# Patient Record
Sex: Female | Born: 1960 | Race: Black or African American | Hispanic: No | Marital: Single | Smoking: Never smoker
Health system: Southern US, Community
[De-identification: ages and names within clinical notes are randomized; demographics above are authoritative.]

## PROBLEM LIST (undated history)

## (undated) DIAGNOSIS — L509 Urticaria, unspecified: Secondary | ICD-10-CM

## (undated) DIAGNOSIS — G43909 Migraine, unspecified, not intractable, without status migrainosus: Secondary | ICD-10-CM

## (undated) DIAGNOSIS — Z923 Personal history of irradiation: Secondary | ICD-10-CM

## (undated) DIAGNOSIS — D126 Benign neoplasm of colon, unspecified: Secondary | ICD-10-CM

## (undated) DIAGNOSIS — Z9221 Personal history of antineoplastic chemotherapy: Secondary | ICD-10-CM

## (undated) DIAGNOSIS — D649 Anemia, unspecified: Secondary | ICD-10-CM

## (undated) DIAGNOSIS — C50919 Malignant neoplasm of unspecified site of unspecified female breast: Secondary | ICD-10-CM

## (undated) DIAGNOSIS — M75120 Complete rotator cuff tear or rupture of unspecified shoulder, not specified as traumatic: Secondary | ICD-10-CM

## (undated) DIAGNOSIS — M75102 Unspecified rotator cuff tear or rupture of left shoulder, not specified as traumatic: Secondary | ICD-10-CM

## (undated) DIAGNOSIS — M7661 Achilles tendinitis, right leg: Secondary | ICD-10-CM

## (undated) DIAGNOSIS — E119 Type 2 diabetes mellitus without complications: Secondary | ICD-10-CM

## (undated) DIAGNOSIS — M1990 Unspecified osteoarthritis, unspecified site: Secondary | ICD-10-CM

## (undated) DIAGNOSIS — N63 Unspecified lump in unspecified breast: Secondary | ICD-10-CM

## (undated) DIAGNOSIS — E785 Hyperlipidemia, unspecified: Secondary | ICD-10-CM

## (undated) DIAGNOSIS — R7303 Prediabetes: Secondary | ICD-10-CM

## (undated) HISTORY — DX: Urticaria, unspecified: L50.9

## (undated) HISTORY — PX: COLONOSCOPY: SHX174

## (undated) HISTORY — DX: Complete rotator cuff tear or rupture of unspecified shoulder, not specified as traumatic: M75.120

## (undated) HISTORY — DX: Malignant neoplasm of unspecified site of unspecified female breast: C50.919

## (undated) HISTORY — DX: Hyperlipidemia, unspecified: E78.5

## (undated) HISTORY — DX: Migraine, unspecified, not intractable, without status migrainosus: G43.909

## (undated) HISTORY — DX: Anemia, unspecified: D64.9

## (undated) HISTORY — DX: Unspecified lump in unspecified breast: N63.0

## (undated) HISTORY — DX: Benign neoplasm of colon, unspecified: D12.6

## (undated) HISTORY — PX: DILATION AND CURETTAGE OF UTERUS: SHX78

## (undated) HISTORY — DX: Unspecified osteoarthritis, unspecified site: M19.90

---

## 1997-02-02 HISTORY — PX: MASTECTOMY: SHX3

## 1997-11-02 HISTORY — PX: BREAST SURGERY: SHX581

## 2000-05-17 ENCOUNTER — Encounter (HOSPITAL_COMMUNITY): Admission: RE | Admit: 2000-05-17 | Discharge: 2000-06-16 | Payer: Self-pay | Admitting: Oncology

## 2000-05-17 ENCOUNTER — Encounter: Admission: RE | Admit: 2000-05-17 | Discharge: 2000-05-17 | Payer: Self-pay | Admitting: Oncology

## 2000-05-18 ENCOUNTER — Encounter: Payer: Self-pay | Admitting: Family Medicine

## 2000-05-18 ENCOUNTER — Encounter: Admission: RE | Admit: 2000-05-18 | Discharge: 2000-05-18 | Payer: Self-pay | Admitting: Family Medicine

## 2000-07-08 ENCOUNTER — Encounter: Payer: Self-pay | Admitting: Neurosurgery

## 2000-07-08 ENCOUNTER — Encounter: Admission: RE | Admit: 2000-07-08 | Discharge: 2000-07-08 | Payer: Self-pay | Admitting: Neurosurgery

## 2000-10-25 ENCOUNTER — Encounter: Admission: RE | Admit: 2000-10-25 | Discharge: 2000-10-25 | Payer: Self-pay | Admitting: Oncology

## 2000-10-25 ENCOUNTER — Encounter (HOSPITAL_COMMUNITY): Admission: RE | Admit: 2000-10-25 | Discharge: 2000-11-24 | Payer: Self-pay | Admitting: Oncology

## 2000-11-22 ENCOUNTER — Encounter (HOSPITAL_COMMUNITY): Payer: Self-pay | Admitting: Oncology

## 2000-11-25 ENCOUNTER — Ambulatory Visit (HOSPITAL_COMMUNITY): Admission: RE | Admit: 2000-11-25 | Discharge: 2000-11-25 | Payer: Self-pay | Admitting: Pediatrics

## 2000-11-25 ENCOUNTER — Encounter: Payer: Self-pay | Admitting: Family Medicine

## 2000-12-23 ENCOUNTER — Other Ambulatory Visit: Admission: RE | Admit: 2000-12-23 | Discharge: 2000-12-23 | Payer: Self-pay | Admitting: Family Medicine

## 2001-02-25 ENCOUNTER — Ambulatory Visit (HOSPITAL_COMMUNITY): Admission: RE | Admit: 2001-02-25 | Discharge: 2001-02-25 | Payer: Self-pay | Admitting: Family Medicine

## 2001-02-25 ENCOUNTER — Encounter: Payer: Self-pay | Admitting: Family Medicine

## 2001-04-22 ENCOUNTER — Encounter: Admission: RE | Admit: 2001-04-22 | Discharge: 2001-04-22 | Payer: Self-pay | Admitting: Oncology

## 2001-04-22 ENCOUNTER — Encounter (HOSPITAL_COMMUNITY): Admission: RE | Admit: 2001-04-22 | Discharge: 2001-05-22 | Payer: Self-pay | Admitting: Oncology

## 2001-05-18 ENCOUNTER — Ambulatory Visit (HOSPITAL_COMMUNITY): Admission: RE | Admit: 2001-05-18 | Discharge: 2001-05-18 | Payer: Self-pay | Admitting: General Surgery

## 2001-10-24 ENCOUNTER — Encounter: Admission: RE | Admit: 2001-10-24 | Discharge: 2001-10-24 | Payer: Self-pay | Admitting: Oncology

## 2001-10-24 ENCOUNTER — Encounter (HOSPITAL_COMMUNITY): Admission: RE | Admit: 2001-10-24 | Discharge: 2001-11-23 | Payer: Self-pay | Admitting: Oncology

## 2001-11-23 ENCOUNTER — Ambulatory Visit (HOSPITAL_COMMUNITY): Admission: RE | Admit: 2001-11-23 | Discharge: 2001-11-23 | Payer: Self-pay | Admitting: Family Medicine

## 2001-11-23 ENCOUNTER — Encounter: Payer: Self-pay | Admitting: Family Medicine

## 2002-01-19 ENCOUNTER — Ambulatory Visit (HOSPITAL_COMMUNITY): Admission: RE | Admit: 2002-01-19 | Discharge: 2002-01-19 | Payer: Self-pay | Admitting: Family Medicine

## 2002-01-19 ENCOUNTER — Encounter: Payer: Self-pay | Admitting: Family Medicine

## 2002-04-24 ENCOUNTER — Encounter: Admission: RE | Admit: 2002-04-24 | Discharge: 2002-04-24 | Payer: Self-pay | Admitting: Oncology

## 2002-04-24 ENCOUNTER — Encounter (HOSPITAL_COMMUNITY): Admission: RE | Admit: 2002-04-24 | Discharge: 2002-05-24 | Payer: Self-pay | Admitting: Oncology

## 2002-05-04 HISTORY — PX: DILATION AND CURETTAGE OF UTERUS: SHX78

## 2002-07-18 ENCOUNTER — Encounter: Admission: RE | Admit: 2002-07-18 | Discharge: 2002-07-18 | Payer: Self-pay | Admitting: Oncology

## 2002-07-18 ENCOUNTER — Encounter (HOSPITAL_COMMUNITY): Admission: RE | Admit: 2002-07-18 | Discharge: 2002-08-17 | Payer: Self-pay | Admitting: Oncology

## 2002-10-24 ENCOUNTER — Encounter (HOSPITAL_COMMUNITY): Admission: RE | Admit: 2002-10-24 | Discharge: 2002-11-23 | Payer: Self-pay | Admitting: Oncology

## 2002-10-24 ENCOUNTER — Encounter: Admission: RE | Admit: 2002-10-24 | Discharge: 2002-10-24 | Payer: Self-pay | Admitting: Oncology

## 2002-11-29 ENCOUNTER — Encounter: Admission: RE | Admit: 2002-11-29 | Discharge: 2002-11-29 | Payer: Self-pay | Admitting: Oncology

## 2002-11-29 ENCOUNTER — Encounter (HOSPITAL_COMMUNITY): Admission: RE | Admit: 2002-11-29 | Discharge: 2002-12-29 | Payer: Self-pay | Admitting: Oncology

## 2003-01-09 ENCOUNTER — Encounter (INDEPENDENT_AMBULATORY_CARE_PROVIDER_SITE_OTHER): Payer: Self-pay | Admitting: *Deleted

## 2003-01-09 LAB — CONVERTED CEMR LAB

## 2003-03-22 ENCOUNTER — Emergency Department (HOSPITAL_COMMUNITY): Admission: EM | Admit: 2003-03-22 | Discharge: 2003-03-22 | Payer: Self-pay | Admitting: Emergency Medicine

## 2003-04-03 ENCOUNTER — Ambulatory Visit (HOSPITAL_COMMUNITY): Admission: RE | Admit: 2003-04-03 | Discharge: 2003-04-03 | Payer: Self-pay | Admitting: Obstetrics and Gynecology

## 2003-04-10 ENCOUNTER — Encounter (INDEPENDENT_AMBULATORY_CARE_PROVIDER_SITE_OTHER): Payer: Self-pay | Admitting: *Deleted

## 2003-04-10 ENCOUNTER — Ambulatory Visit (HOSPITAL_COMMUNITY): Admission: RE | Admit: 2003-04-10 | Discharge: 2003-04-10 | Payer: Self-pay | Admitting: Obstetrics and Gynecology

## 2003-05-01 ENCOUNTER — Encounter (HOSPITAL_COMMUNITY): Admission: RE | Admit: 2003-05-01 | Discharge: 2003-05-31 | Payer: Self-pay | Admitting: Oncology

## 2003-05-01 ENCOUNTER — Encounter: Admission: RE | Admit: 2003-05-01 | Discharge: 2003-05-01 | Payer: Self-pay | Admitting: Oncology

## 2003-07-10 ENCOUNTER — Emergency Department (HOSPITAL_COMMUNITY): Admission: EM | Admit: 2003-07-10 | Discharge: 2003-07-10 | Payer: Self-pay | Admitting: Emergency Medicine

## 2003-07-25 ENCOUNTER — Encounter: Admission: RE | Admit: 2003-07-25 | Discharge: 2003-07-25 | Payer: Self-pay | Admitting: Oncology

## 2003-07-25 ENCOUNTER — Encounter (HOSPITAL_COMMUNITY): Admission: RE | Admit: 2003-07-25 | Discharge: 2003-08-24 | Payer: Self-pay | Admitting: Oncology

## 2003-11-30 ENCOUNTER — Encounter (HOSPITAL_COMMUNITY): Admission: RE | Admit: 2003-11-30 | Discharge: 2003-12-30 | Payer: Self-pay | Admitting: Oncology

## 2004-03-05 ENCOUNTER — Ambulatory Visit: Payer: Self-pay | Admitting: Family Medicine

## 2004-04-29 ENCOUNTER — Encounter: Admission: RE | Admit: 2004-04-29 | Discharge: 2004-04-29 | Payer: Self-pay | Admitting: Oncology

## 2004-04-29 ENCOUNTER — Ambulatory Visit (HOSPITAL_COMMUNITY): Payer: Self-pay | Admitting: Oncology

## 2004-04-29 ENCOUNTER — Encounter (HOSPITAL_COMMUNITY): Admission: RE | Admit: 2004-04-29 | Discharge: 2004-05-29 | Payer: Self-pay | Admitting: Oncology

## 2004-10-01 ENCOUNTER — Ambulatory Visit: Payer: Self-pay | Admitting: Family Medicine

## 2004-12-18 ENCOUNTER — Ambulatory Visit (HOSPITAL_COMMUNITY): Admission: RE | Admit: 2004-12-18 | Discharge: 2004-12-18 | Payer: Self-pay | Admitting: Family Medicine

## 2005-01-01 ENCOUNTER — Ambulatory Visit: Payer: Self-pay | Admitting: Orthopedic Surgery

## 2005-01-29 ENCOUNTER — Ambulatory Visit: Payer: Self-pay | Admitting: Family Medicine

## 2005-04-27 ENCOUNTER — Ambulatory Visit (HOSPITAL_COMMUNITY): Payer: Self-pay | Admitting: Oncology

## 2005-04-27 ENCOUNTER — Encounter (HOSPITAL_COMMUNITY): Admission: RE | Admit: 2005-04-27 | Discharge: 2005-05-27 | Payer: Self-pay | Admitting: Oncology

## 2005-04-27 ENCOUNTER — Encounter: Admission: RE | Admit: 2005-04-27 | Discharge: 2005-04-27 | Payer: Self-pay | Admitting: Oncology

## 2005-09-09 ENCOUNTER — Ambulatory Visit: Payer: Self-pay | Admitting: Family Medicine

## 2005-11-17 ENCOUNTER — Ambulatory Visit: Payer: Self-pay | Admitting: Family Medicine

## 2005-12-21 ENCOUNTER — Ambulatory Visit (HOSPITAL_COMMUNITY): Admission: RE | Admit: 2005-12-21 | Discharge: 2005-12-21 | Payer: Self-pay | Admitting: Family Medicine

## 2005-12-21 ENCOUNTER — Encounter: Admission: RE | Admit: 2005-12-21 | Discharge: 2005-12-21 | Payer: Self-pay | Admitting: Oncology

## 2005-12-31 ENCOUNTER — Encounter: Admission: RE | Admit: 2005-12-31 | Discharge: 2005-12-31 | Payer: Self-pay | Admitting: Oncology

## 2006-01-07 ENCOUNTER — Ambulatory Visit: Payer: Self-pay | Admitting: Family Medicine

## 2006-04-26 ENCOUNTER — Encounter (HOSPITAL_COMMUNITY): Admission: RE | Admit: 2006-04-26 | Discharge: 2006-05-26 | Payer: Self-pay | Admitting: Oncology

## 2006-04-26 ENCOUNTER — Ambulatory Visit (HOSPITAL_COMMUNITY): Payer: Self-pay | Admitting: Oncology

## 2006-10-27 ENCOUNTER — Ambulatory Visit (HOSPITAL_COMMUNITY): Payer: Self-pay | Admitting: Oncology

## 2006-10-27 ENCOUNTER — Encounter (HOSPITAL_COMMUNITY): Admission: RE | Admit: 2006-10-27 | Discharge: 2006-11-02 | Payer: Self-pay | Admitting: Oncology

## 2006-11-18 ENCOUNTER — Ambulatory Visit: Payer: Self-pay | Admitting: Family Medicine

## 2006-11-23 ENCOUNTER — Ambulatory Visit (HOSPITAL_COMMUNITY): Admission: RE | Admit: 2006-11-23 | Discharge: 2006-11-23 | Payer: Self-pay | Admitting: Family Medicine

## 2006-12-16 ENCOUNTER — Ambulatory Visit (HOSPITAL_COMMUNITY): Admission: RE | Admit: 2006-12-16 | Discharge: 2006-12-16 | Payer: Self-pay | Admitting: Obstetrics and Gynecology

## 2006-12-17 ENCOUNTER — Ambulatory Visit (HOSPITAL_COMMUNITY): Admission: RE | Admit: 2006-12-17 | Discharge: 2006-12-17 | Payer: Self-pay | Admitting: Obstetrics and Gynecology

## 2006-12-17 ENCOUNTER — Encounter (INDEPENDENT_AMBULATORY_CARE_PROVIDER_SITE_OTHER): Payer: Self-pay | Admitting: Obstetrics and Gynecology

## 2007-01-03 ENCOUNTER — Encounter (HOSPITAL_COMMUNITY): Admission: RE | Admit: 2007-01-03 | Discharge: 2007-02-02 | Payer: Self-pay | Admitting: Oncology

## 2007-02-03 HISTORY — PX: OTHER SURGICAL HISTORY: SHX169

## 2007-02-22 ENCOUNTER — Encounter: Payer: Self-pay | Admitting: *Deleted

## 2007-02-22 DIAGNOSIS — M779 Enthesopathy, unspecified: Secondary | ICD-10-CM | POA: Insufficient documentation

## 2007-02-22 DIAGNOSIS — E785 Hyperlipidemia, unspecified: Secondary | ICD-10-CM | POA: Insufficient documentation

## 2007-02-22 DIAGNOSIS — C50919 Malignant neoplasm of unspecified site of unspecified female breast: Secondary | ICD-10-CM | POA: Insufficient documentation

## 2007-02-22 DIAGNOSIS — E669 Obesity, unspecified: Secondary | ICD-10-CM | POA: Insufficient documentation

## 2007-03-16 ENCOUNTER — Encounter: Payer: Self-pay | Admitting: Family Medicine

## 2007-03-16 LAB — CONVERTED CEMR LAB
BUN: 14 mg/dL (ref 6–23)
CO2: 19 meq/L (ref 19–32)
Calcium: 9.3 mg/dL (ref 8.4–10.5)
Chloride: 104 meq/L (ref 96–112)
Creatinine, Ser: 0.7 mg/dL (ref 0.40–1.20)
Eosinophils Relative: 2 % (ref 0–5)
Glucose, Bld: 82 mg/dL (ref 70–99)
HCT: 39.7 % (ref 36.0–46.0)
Hemoglobin: 13.1 g/dL (ref 12.0–15.0)
LDL Cholesterol: 142 mg/dL — ABNORMAL HIGH (ref 0–99)
Lymphocytes Relative: 44 % (ref 12–46)
Monocytes Absolute: 0.2 10*3/uL (ref 0.1–1.0)
Monocytes Relative: 5 % (ref 3–12)
Neutro Abs: 2 10*3/uL (ref 1.7–7.7)
RBC: 4.26 M/uL (ref 3.87–5.11)
RDW: 15.7 % — ABNORMAL HIGH (ref 11.5–15.5)
TSH: 1.669 microintl units/mL (ref 0.350–5.50)

## 2007-03-23 ENCOUNTER — Ambulatory Visit: Payer: Self-pay | Admitting: Family Medicine

## 2007-04-25 ENCOUNTER — Encounter (HOSPITAL_COMMUNITY): Admission: RE | Admit: 2007-04-25 | Discharge: 2007-05-25 | Payer: Self-pay | Admitting: Oncology

## 2007-04-25 ENCOUNTER — Ambulatory Visit (HOSPITAL_COMMUNITY): Payer: Self-pay | Admitting: Oncology

## 2007-05-25 ENCOUNTER — Encounter: Payer: Self-pay | Admitting: Orthopedic Surgery

## 2007-05-25 ENCOUNTER — Emergency Department (HOSPITAL_COMMUNITY): Admission: EM | Admit: 2007-05-25 | Discharge: 2007-05-26 | Payer: Self-pay | Admitting: Emergency Medicine

## 2007-05-31 ENCOUNTER — Ambulatory Visit: Payer: Self-pay | Admitting: Family Medicine

## 2007-06-08 ENCOUNTER — Ambulatory Visit: Payer: Self-pay | Admitting: Orthopedic Surgery

## 2007-06-08 DIAGNOSIS — M75120 Complete rotator cuff tear or rupture of unspecified shoulder, not specified as traumatic: Secondary | ICD-10-CM | POA: Insufficient documentation

## 2007-06-08 HISTORY — DX: Complete rotator cuff tear or rupture of unspecified shoulder, not specified as traumatic: M75.120

## 2007-06-16 ENCOUNTER — Telehealth: Payer: Self-pay | Admitting: Orthopedic Surgery

## 2007-06-20 ENCOUNTER — Encounter: Payer: Self-pay | Admitting: Orthopedic Surgery

## 2007-06-22 ENCOUNTER — Telehealth: Payer: Self-pay | Admitting: Orthopedic Surgery

## 2007-06-30 ENCOUNTER — Encounter: Payer: Self-pay | Admitting: Orthopedic Surgery

## 2007-07-05 ENCOUNTER — Telehealth: Payer: Self-pay | Admitting: Orthopedic Surgery

## 2007-07-11 ENCOUNTER — Ambulatory Visit: Payer: Self-pay | Admitting: Orthopedic Surgery

## 2007-07-20 ENCOUNTER — Encounter (HOSPITAL_COMMUNITY): Admission: RE | Admit: 2007-07-20 | Discharge: 2007-08-19 | Payer: Self-pay | Admitting: Orthopedic Surgery

## 2007-07-20 ENCOUNTER — Encounter: Payer: Self-pay | Admitting: Orthopedic Surgery

## 2007-08-17 ENCOUNTER — Encounter: Payer: Self-pay | Admitting: Orthopedic Surgery

## 2007-08-22 ENCOUNTER — Encounter (HOSPITAL_COMMUNITY): Admission: RE | Admit: 2007-08-22 | Discharge: 2007-09-21 | Payer: Self-pay | Admitting: Orthopedic Surgery

## 2007-09-15 ENCOUNTER — Encounter: Payer: Self-pay | Admitting: Orthopedic Surgery

## 2007-09-19 ENCOUNTER — Ambulatory Visit: Payer: Self-pay | Admitting: Orthopedic Surgery

## 2007-09-19 DIAGNOSIS — M25519 Pain in unspecified shoulder: Secondary | ICD-10-CM | POA: Insufficient documentation

## 2007-10-24 ENCOUNTER — Ambulatory Visit (HOSPITAL_COMMUNITY): Payer: Self-pay | Admitting: Oncology

## 2007-10-24 ENCOUNTER — Encounter (HOSPITAL_COMMUNITY): Admission: RE | Admit: 2007-10-24 | Discharge: 2007-10-31 | Payer: Self-pay | Admitting: Oncology

## 2007-12-02 ENCOUNTER — Encounter: Admission: RE | Admit: 2007-12-02 | Discharge: 2007-12-02 | Payer: Self-pay | Admitting: Obstetrics and Gynecology

## 2008-01-11 ENCOUNTER — Ambulatory Visit: Payer: Self-pay | Admitting: Family Medicine

## 2008-01-11 DIAGNOSIS — M25559 Pain in unspecified hip: Secondary | ICD-10-CM | POA: Insufficient documentation

## 2008-01-11 DIAGNOSIS — R109 Unspecified abdominal pain: Secondary | ICD-10-CM | POA: Insufficient documentation

## 2008-01-11 DIAGNOSIS — R5383 Other fatigue: Secondary | ICD-10-CM

## 2008-01-11 DIAGNOSIS — R5381 Other malaise: Secondary | ICD-10-CM | POA: Insufficient documentation

## 2008-02-02 ENCOUNTER — Telehealth: Payer: Self-pay | Admitting: Family Medicine

## 2008-04-23 ENCOUNTER — Ambulatory Visit (HOSPITAL_COMMUNITY): Payer: Self-pay | Admitting: Oncology

## 2008-04-23 ENCOUNTER — Encounter (HOSPITAL_COMMUNITY): Admission: RE | Admit: 2008-04-23 | Discharge: 2008-05-23 | Payer: Self-pay | Admitting: Oncology

## 2008-09-03 ENCOUNTER — Ambulatory Visit: Payer: Self-pay | Admitting: Family Medicine

## 2008-09-03 DIAGNOSIS — R42 Dizziness and giddiness: Secondary | ICD-10-CM | POA: Insufficient documentation

## 2008-09-03 DIAGNOSIS — S139XXA Sprain of joints and ligaments of unspecified parts of neck, initial encounter: Secondary | ICD-10-CM | POA: Insufficient documentation

## 2008-10-23 ENCOUNTER — Encounter: Payer: Self-pay | Admitting: Family Medicine

## 2008-10-24 LAB — CONVERTED CEMR LAB
Basophils Absolute: 0 10*3/uL (ref 0.0–0.1)
Chloride: 106 meq/L (ref 96–112)
Cholesterol: 182 mg/dL (ref 0–200)
Glucose, Bld: 82 mg/dL (ref 70–99)
HCT: 38.3 % (ref 36.0–46.0)
Hemoglobin: 12.8 g/dL (ref 12.0–15.0)
LDL Cholesterol: 107 mg/dL — ABNORMAL HIGH (ref 0–99)
Lymphocytes Relative: 32 % (ref 12–46)
Lymphs Abs: 1.9 10*3/uL (ref 0.7–4.0)
Monocytes Absolute: 0.4 10*3/uL (ref 0.1–1.0)
Neutro Abs: 3.5 10*3/uL (ref 1.7–7.7)
Potassium: 4 meq/L (ref 3.5–5.3)
RBC: 4.21 M/uL (ref 3.87–5.11)
RDW: 15.3 % (ref 11.5–15.5)
Sodium: 140 meq/L (ref 135–145)
Total CHOL/HDL Ratio: 4
Triglycerides: 147 mg/dL (ref <150)
VLDL: 29 mg/dL (ref 0–40)
WBC: 5.9 10*3/uL (ref 4.0–10.5)

## 2008-12-04 ENCOUNTER — Ambulatory Visit (HOSPITAL_COMMUNITY): Admission: RE | Admit: 2008-12-04 | Discharge: 2008-12-04 | Payer: Self-pay | Admitting: Family Medicine

## 2009-01-10 ENCOUNTER — Ambulatory Visit: Payer: Self-pay | Admitting: Family Medicine

## 2009-01-10 DIAGNOSIS — L049 Acute lymphadenitis, unspecified: Secondary | ICD-10-CM | POA: Insufficient documentation

## 2009-02-09 ENCOUNTER — Emergency Department (HOSPITAL_COMMUNITY): Admission: EM | Admit: 2009-02-09 | Discharge: 2009-02-09 | Payer: Self-pay | Admitting: Emergency Medicine

## 2009-04-19 ENCOUNTER — Encounter (HOSPITAL_COMMUNITY): Admission: RE | Admit: 2009-04-19 | Discharge: 2009-05-19 | Payer: Self-pay | Admitting: Oncology

## 2009-04-19 ENCOUNTER — Ambulatory Visit (HOSPITAL_COMMUNITY): Payer: Self-pay | Admitting: Oncology

## 2009-04-23 ENCOUNTER — Encounter: Payer: Self-pay | Admitting: Family Medicine

## 2009-05-17 ENCOUNTER — Ambulatory Visit: Payer: Self-pay | Admitting: Family Medicine

## 2009-05-17 DIAGNOSIS — E041 Nontoxic single thyroid nodule: Secondary | ICD-10-CM | POA: Insufficient documentation

## 2009-05-20 LAB — CONVERTED CEMR LAB
Basophils Relative: 0 % (ref 0–1)
Calcium: 9.5 mg/dL (ref 8.4–10.5)
Cholesterol: 205 mg/dL — ABNORMAL HIGH (ref 0–200)
Creatinine, Ser: 0.73 mg/dL (ref 0.40–1.20)
Eosinophils Absolute: 0.1 10*3/uL (ref 0.0–0.7)
HDL: 49 mg/dL (ref >39)
MCHC: 32.5 g/dL (ref 30.0–36.0)
MCV: 90.8 fL (ref 78.0–100.0)
Neutrophils Relative %: 49 % (ref 43–77)
Platelets: 234 10*3/uL (ref 150–400)
Triglycerides: 108 mg/dL (ref <150)
WBC: 4.1 10*3/uL (ref 4.0–10.5)

## 2009-12-05 ENCOUNTER — Ambulatory Visit (HOSPITAL_COMMUNITY): Admission: RE | Admit: 2009-12-05 | Discharge: 2009-12-05 | Payer: Self-pay | Admitting: Oncology

## 2009-12-30 ENCOUNTER — Ambulatory Visit: Payer: Self-pay | Admitting: Family Medicine

## 2009-12-31 ENCOUNTER — Ambulatory Visit (HOSPITAL_COMMUNITY): Admission: RE | Admit: 2009-12-31 | Discharge: 2009-12-31 | Payer: Self-pay | Admitting: Family Medicine

## 2010-01-01 ENCOUNTER — Encounter: Payer: Self-pay | Admitting: Family Medicine

## 2010-01-01 DIAGNOSIS — R51 Headache: Secondary | ICD-10-CM

## 2010-01-01 DIAGNOSIS — R519 Headache, unspecified: Secondary | ICD-10-CM | POA: Insufficient documentation

## 2010-01-03 LAB — CONVERTED CEMR LAB
HDL: 52 mg/dL (ref >39)
Hgb A1c MFr Bld: 6.1 % — ABNORMAL HIGH (ref <5.7)
LDL Cholesterol: 132 mg/dL — ABNORMAL HIGH (ref 0–99)
Total CHOL/HDL Ratio: 3.9

## 2010-01-15 ENCOUNTER — Telehealth: Payer: Self-pay | Admitting: Family Medicine

## 2010-01-16 ENCOUNTER — Ambulatory Visit: Payer: Self-pay | Admitting: Otolaryngology

## 2010-01-16 ENCOUNTER — Encounter: Payer: Self-pay | Admitting: Family Medicine

## 2010-02-22 ENCOUNTER — Encounter: Payer: Self-pay | Admitting: Family Medicine

## 2010-02-23 ENCOUNTER — Encounter: Payer: Self-pay | Admitting: Orthopedic Surgery

## 2010-02-23 ENCOUNTER — Encounter: Payer: Self-pay | Admitting: Family Medicine

## 2010-02-23 ENCOUNTER — Encounter (HOSPITAL_COMMUNITY): Payer: Self-pay | Admitting: Oncology

## 2010-03-04 NOTE — Letter (Signed)
Summary: Letter  Letter   Imported By: Lind Guest 01/02/2010 08:53:21  _____________________________________________________________________  External Attachment:    Type:   Image     Comment:   External Document

## 2010-03-04 NOTE — Assessment & Plan Note (Signed)
Summary: back   Vital Signs:  Patient profile:   49 year old female Menstrual status:  irregular Height:      64 inches Weight:      284.25 pounds BMI:     48.97 O2 Sat:      96 % on Room air Pulse rate:   84 / minute Pulse rhythm:   regular Resp:     16 per minute BP sitting:   110 / 70  (left arm)  Vitals Entered By: Adella Hare LPN (December 30, 2009 8:37 AM)  Nutrition Counseling: Patient's BMI is greater than 25 and therefore counseled on weight management options.  O2 Flow:  Room air CC: follow-up visit Is Patient Diabetic? No Pain Assessment Patient in pain? no      Comments did not bring meds to ov   CC:  follow-up visit.  History of Present Illness: 2 weeks ago pt upper back pain andspasm after making a bed, she saw gynae the next day who advised it was likely  a pullled muscle, nothing was done symptoms have resolved, but wants muscle relaxant for as needed use. Reports  that she has otherwise been doing well. Denies recent fever or chills. Denies sinus pressure, nasal congestion , ear pain or sore throat. Denies chest congestion, or cough productive of sputum. Denies chest pain, palpitations, PND, orthopnea or leg swelling. Denies abdominal pain, nausea, vomitting, diarrhea or constipation. Denies change in bowel movements or bloody stool. Denies dysuria , frequency, incontinence or hesitancy. . Denies  vertigo, or seizures.she does have intermittent headaches Denies depression, anxiety or insomnia. Denies  rash, lesions, or itch. ms Norman has been walking less consitenetly than in the past , and she continues to stuggle with weight gain. states she recently had a pelvic exam but no rectalwas done, requesting same.      Allergies (verified): No Known Drug Allergies  Review of Systems      See HPI General:  Complains of fatigue. Eyes:  Denies blurring, discharge, eye pain, and red eye. MS:  Complains of low back pain, mid back pain, and  stiffness; hip pain and intermittent back pain , and numbness and tingling in hands x 1 year, no testing or specific mx desired at this time. Neuro:  Complains of headaches and tingling; c/o tingling and numbness in the hands  for approx 1 yr, not waking her up and no weakness in the hands. Endo:  Denies cold intolerance, excessive hunger, excessive thirst, and excessive urination. Heme:  Denies abnormal bruising and bleeding. Allergy:  Denies hives or rash and itching eyes.  Physical Exam  General:  Well-developed,obeseiin no acute distress; alert,appropriate and cooperative throughout examination HEENT: No facial asymmetry, goiter EOMI, No sinus tenderness, TM's Clear, oropharynx  pink and moist.   Chest: Clear to auscultation bilaterally.  CVS: S1, S2, No murmurs, No S3.   Abd: Soft, Nontender.obese, no organomegaly or masses palpated. Rectal; guaivc negative stool MS: decreased ROM spine,adequate in hips, shoulders and knees. Thenar wasting of both hands, left greater than right , and positive tinnel's Ext: No edema.   CNS: CN 2-12 intact, power tone and sensation normal throughout.   Skin: Intact, no visible lesions or rashes.  Psych: Good eye contact, normal affect.  Memory intact, not anxious or depressed appearing.    Impression & Recommendations:  Problem # 1:  GOITER (ICD-240.9) Assessment Comment Only  Orders: Radiology Referral (Radiology) ENT Referral (ENT)  Problem # 2:  OBESITY, UNSPECIFIED (ICD-278.00) Assessment: Deteriorated  Ht: 64 (12/30/2009)   Wt: 284.25 (12/30/2009)   BMI: 48.97 (12/30/2009) therapeutic lifestyle change discussed and encouraged  Problem # 3:  DYSLIPIDEMIA (ICD-272.4) Assessment: Comment Only  Orders: T-Lipid Profile ([address])    HDL:49 (05/17/2009), 46 (10/23/2008)  LDL:134 (05/17/2009), 107 (10/23/2008)  Chol:205 (05/17/2009), 182 (10/23/2008)  Trig:108 (05/17/2009), 147 (10/23/2008) Low fat dietdiscussed and  encouraged  Problem # 4:  HIP PAIN, RIGHT (ICD-719.45) Assessment: Unchanged  The following medications were removed from the medication list:    Ibuprofen 800 Mg Tabs (Ibuprofen) .Marland Kitchen... Take 1 tablet by mouth three times a day as needed    Fioricet 50-325-40 Mg Tabs (Butalbital-apap-caffeine) .Marland Kitchen... 1-2 tabs qid as needed Her updated medication list for this problem includes:    Adult Aspirin Low Strength 81 Mg Tbdp (Aspirin) .Marland Kitchen... Take one tablet by mouth once aday    Cyclobenzaprine Hcl 10 Mg Tabs (Cyclobenzaprine hcl) .Marland Kitchen... Take 1 tablet by mouth two times a day as needed for spasm    Fioricet 50-325-40 Mg Tabs (Butalbital-apap-caffeine) ..... One  tablets three times daily as needed fo severe  headache as needed    Ibuprofen 800 Mg Tabs (Ibuprofen) .Marland Kitchen... Take 1 tablet by mouth three times a day as needed for pain  Problem # 5:  HEADACHE (ICD-784.0) Assessment: Unchanged  The following medications were removed from the medication list:    Ibuprofen 800 Mg Tabs (Ibuprofen) .Marland Kitchen... Take 1 tablet by mouth three times a day as needed    Fioricet 50-325-40 Mg Tabs (Butalbital-apap-caffeine) .Marland Kitchen... 1-2 tabs qid as needed Her updated medication list for this problem includes:    Adult Aspirin Low Strength 81 Mg Tbdp (Aspirin) .Marland Kitchen... Take one tablet by mouth once aday    Fioricet 50-325-40 Mg Tabs (Butalbital-apap-caffeine) ..... One  tablets three times daily as needed fo severe  headache as needed    Ibuprofen 800 Mg Tabs (Ibuprofen) .Marland Kitchen... Take 1 tablet by mouth three times a day as needed for pain  Complete Medication List: 1)  Adult Aspirin Low Strength 81 Mg Tbdp (Aspirin) .... Take one tablet by mouth once aday 2)  Multivitamin  3)  Slow Fe 160 (50 Fe) Mg Cr-tabs (Ferrous sulfate dried) .... Take 1 tablet by mouth once a day 4)  Restoril 15 Mg Caps (Temazepam) .... Take 1 capsule by mouth at bedtime as needed 5)  Cyclobenzaprine Hcl 10 Mg Tabs (Cyclobenzaprine hcl) .... Take 1 tablet by  mouth two times a day as needed for spasm 6)  Meclizine Hcl 25 Mg Chew (Meclizine hcl) .... Take 1 tablet by mouth three times a day as needed for dizziness 7)  Fioricet 50-325-40 Mg Tabs (Butalbital-apap-caffeine) .... One  tablets three times daily as needed fo severe  headache as needed 8)  Ibuprofen 800 Mg Tabs (Ibuprofen) .... Take 1 tablet by mouth three times a day as needed for pain  Other Orders: T- Hemoglobin A1C ([address]11-91478) Hemoccult Guaiac-1 spec.(in office) ([address]562)  Patient Instructions: 1)  Follow up appointment in 4months 2)  It is important that you exercise regularly at least 30 minutes 5 times a week. If you develop chest pain, have severe difficulty breathing, or feel very tired , stop exercising immediately and seek medical attention. 3)  You need to lose weight. Consider a lower calorie diet and regular exercise.  4)  Lipid Panel prior to visit, ICD-9: 5)  HbgA1C prior to visit, ICD-9: Prescriptions: IBUPROFEN 800 MG TABS (IBUPROFEN) Take 1 tablet by mouth three times a day as needed  for pain  #42 x 1   Entered and Authorized by:   [name] MD   Signed by:   [name] MD on 12/30/2009   Method used:   Electronically to        [facility]  [address] (retail)       [address]       [place], [place]  [address]       Ph: [phone]       Fax: [phone]   RxID:   [id number] FIORICET 50-325-40 MG TABS [facility]) one  tablets three times daily as needed fo severe  headache as needed  #30 x 2   Entered and Authorized by:   [name] MD   Signed by:   [name] MD on 12/30/2009   Method used:   Electronically to        [facility]  [address] (retail)       [address]       [place], [place]  [address]       Ph: [phone]       Fax: [phone]   RxID:   [id number] MECLIZINE HCL 25 MG CHEW (MECLIZINE HCL) Take 1 tablet by mouth three times a day as needed for dizziness   #30 x 1   Entered and Authorized by:   [name] MD   Signed by:   [name] MD on 12/30/2009   Method used:   Electronically to        [facility]  [address] (retail)       [address]       [place], [place]  [address]       Ph: [phone]       Fax: [phone]   RxID:   [id number] CYCLOBENZAPRINE HCL 10 MG TABS (CYCLOBENZAPRINE HCL) Take 1 tablet by mouth two times a day as needed for spasm  #60 x 0   Entered and Authorized by:   [name] MD   Signed by:   [name] MD on 12/30/2009   Method used:   Electronically to        [facility]  [address] (retail)       [address]       [place], [place]  [address]       Ph: [phone]       Fax: [phone]   RxID:   [id number]    Orders Added: 1)  Est. Patient Level IV [[address]] 2)  T-Lipid Profile [[address]61-[address]30] 3)  T- Hemoglobin A1C [83036-23375] 4)  Hemoccult Guaiac-1 spec.(in office) [82270] 5)  Radiology Referral [Radiology] 6)  ENT Referral [ENT]    Laboratory Results  Date/Time Received: December 30, 2009 9:14 AM  Date/Time Reported: December 30, 2009 9:14 AM   Stool - Occult Blood Hemmoccult #1: negative Date: 12/30/2009 Comments: 5030 5/14 50590 1L 03/12 Adella Hare LPN  December 30, 2009 9:15 AM

## 2010-03-04 NOTE — Progress Notes (Signed)
Summary: [facility]  [facility]   Imported By: Lind Guest 05/02/2009 11:20:15  _____________________________________________________________________  External Attachment:    Type:   Image     Comment:   External Document

## 2010-03-04 NOTE — Assessment & Plan Note (Signed)
Summary: F UP   Vital Signs:  Patient profile:   50 year old female Menstrual status:  irregular Height:      64 inches Weight:      275.25 pounds BMI:     47.42 O2 Sat:      94 % Pulse rate:   83 / minute Pulse rhythm:   regular Resp:     16 per minute BP sitting:   118 / 80  (left arm) Cuff size:   large  Vitals Entered By: Everitt Amber LPN (May 17, 2009 8:00 AM)  Nutrition Counseling: Patient's BMI is greater than 25 and therefore counseled on weight management options. CC: Follow up chronic problems     Menstrual Status irregular Last PAP Result Done   CC:  Follow up chronic problems.  History of Present Illness: Reports  that she has been doing well. Denies recent fever or chills. Denies sinus pressure, nasal congestion , ear pain or sore throat. Denies chest congestion, or cough productive of sputum. Denies chest pain, palpitations, PND, orthopnea or leg swelling. Denies abdominal pain, nausea, vomitting, diarrhea or constipation. Denies change in bowel movements or bloody stool. Denies dysuria , frequency, incontinence or hesitancy. Denies  joint pain, swelling, or reduced mobility. Denies headaches, vertigo, seizures. Denies depression, anxiety or insomnia. Denies  rash, lesions, or itch She walks at least 5 days per wee for 1 hour each time, she has modified her diet somewhat and is disappointed about her weight loss     Current Medications (verified): 1)  Adult Aspirin Low Strength 81 Mg  Tbdp (Aspirin) .... Take One Tablet By Mouth Once Aday 2)  Multivitamin 3)  Slow Fe 160 (50 Fe) Mg Cr-Tabs (Ferrous Sulfate Dried) .... Take 1 Tablet By Mouth Once A Day 4)  Ibuprofen 800 Mg Tabs (Ibuprofen) .... Take 1 Tablet By Mouth Three Times A Day As Needed 5)  Fioricet 50-325-40 Mg Tabs (Butalbital-Apap-Caffeine) .Marland Kitchen.. 1-2 Tabs Qid As Needed 6)  Meclizine Hcl 25 Mg Tabs (Meclizine Hcl) .... Take 1 Tablet By Mouth Three Times A Day As Needed  Allergies  (verified): No Known Drug Allergies  Review of Systems      See HPI General:  Complains of fatigue, sleep disorder, sweats, and weakness; no energy with night sweats and fatigue. Eyes:  Denies blurring, discharge, eye pain, and red eye. GU:  Complains of abnormal vaginal bleeding; pt rep[ts she bled internally into her womb for Dec to Jan, saw gynae then who did sonogram saw clots, did d/c since then nothing and now hot flashes. MS:  Denies joint pain and stiffness; shoulder pain resolved. Neuro:  Denies headaches, seizures, sensation of room spinning, and tingling. Endo:  Denies cold intolerance, excessive hunger, excessive thirst, excessive urination, heat intolerance, polyuria, and weight change. Heme:  Denies abnormal bruising and bleeding. Allergy:  Complains of seasonal allergies.  Physical Exam  General:  Well-developed,obese,in no acute distress; alert,appropriate and cooperative throughout examination HEENT: No facial asymmetry, thyroid enlarged EOMI, No sinus tenderness, TM's Clear, oropharynx  pink and moist. cervical adenitis, right  Chest: Clear to auscultation bilaterally.  CVS: S1, S2, No murmurs, No S3.   Abd: Soft, Nontender.  MS: Adequate ROM spine, hips, shoulders and knees.  Ext: No edema.   CNS: CN 2-12 intact, power tone and sensation normal throughout.   Skin: Intact, no visible lesions or rashes.  Psych: Good eye contact, normal affect.  Memory intact, not anxious or depressed appearing.    Impression &  Recommendations:  Problem # 1:  GOITER (ICD-240.9) Assessment Comment Only  Orders: Radiology Referral (Radiology)  Problem # 2:  OBESITY, UNSPECIFIED (ICD-278.00) Assessment: Improved  Ht: 64 (05/17/2009)   Wt: 275.25 (05/17/2009)   BMI: 47.42 (05/17/2009)  Problem # 3:  DYSLIPIDEMIA (ICD-272.4) Assessment: Comment Only  Orders: T-Lipid Profile ([address])    HDL:46 (10/23/2008), 51 (03/16/2007)  LDL:107 (10/23/2008), 142 (03/16/2007)   Chol:182 (10/23/2008), 218 (03/16/2007)  Trig:147 (10/23/2008), 124 (03/16/2007) low fat diet discussed and encouraged  Complete Medication List: 1)  Adult Aspirin Low Strength 81 Mg Tbdp (Aspirin) .... Take one tablet by mouth once aday 2)  Multivitamin  3)  Slow Fe 160 (50 Fe) Mg Cr-tabs (Ferrous sulfate dried) .... Take 1 tablet by mouth once a day 4)  Ibuprofen 800 Mg Tabs (Ibuprofen) .... Take 1 tablet by mouth three times a day as needed 5)  Fioricet 50-325-40 Mg Tabs (Butalbital-apap-caffeine) .Marland Kitchen.. 1-2 tabs qid as needed 6)  Meclizine Hcl 25 Mg Tabs (Meclizine hcl) .... Take 1 tablet by mouth three times a day as needed 7)  Restoril 15 Mg Caps (Temazepam) .... Take 1 capsule by mouth at bedtime as needed  Other Orders: T-Basic Metabolic Panel [id number]) T-TSH [phone]) T-CBC w/Diff [phone])  Patient Instructions: 1)  Please schedule a follow-up appointment in 4  months. 2)  It is important that you exercise regularly at least 20 minutes 5 times a week. If you develop chest pain, have severe difficulty breathing, or feel very tired , stop exercising immediately and seek medical attention. 3)  You need to lose weight. Consider a lower calorie diet and regular exercise.  4)  BMP prior to visit, ICD-9: 5)  Lipid Panel prior to visit, ICD-9:  fasting today, fatigue 6)  TSH prior to visit, ICD-9: 7)  CBC w/ Diff prior to visit, ICD-9: 8)  You will be referred for an ultrasound of your thyroid gland Prescriptions: RESTORIL 15 MG CAPS (TEMAZEPAM) Take 1 capsule by mouth at bedtime as needed  #30 x 3   Entered and Authorized by:   [name] MD   Signed by:   [name] MD on 05/17/2009   Method used:   Print then Give to Patient   RxID:   [id number]

## 2010-03-06 NOTE — Progress Notes (Signed)
Summary: TEST RESULT  Phone Note Call from Patient   Summary of Call: PATIENT CALL WANTED TO KNOW HER RESULT ON TEST THAT WHERE DONE ON COLON TEST PLEASE CALL [phone] Initial call taken by: Eugenio Hoes,  January 15, 2010 1:35 PM  Follow-up for Phone Call        patient aware stool test done in ov negative Follow-up by: Adella Hare LPN,  January 16, 2010 9:05 AM

## 2010-03-06 NOTE — Letter (Signed)
Summary: ENT  ENT   Imported By: Lind Guest 01/28/2010 10:02:10  _____________________________________________________________________  External Attachment:    Type:   Image     Comment:   External Document

## 2010-03-24 ENCOUNTER — Telehealth: Payer: Self-pay | Admitting: Family Medicine

## 2010-04-01 NOTE — Progress Notes (Signed)
Summary: returning call from 02/17  Phone Note Call from Patient   Summary of Call: Patient says she is returning call to [place] from Friday.  [phone].   Initial call taken by: Doneen Poisson  Follow-up for Phone Call        noted but i do not see in her chart that anyone has attempted to call this patient Follow-up by: Adella Hare LPN,  March 24, 2010 1:11 PM

## 2010-04-18 ENCOUNTER — Encounter (HOSPITAL_COMMUNITY): Payer: BC Managed Care – PPO | Attending: Oncology

## 2010-04-18 ENCOUNTER — Other Ambulatory Visit (HOSPITAL_COMMUNITY): Payer: BC Managed Care – PPO

## 2010-04-18 DIAGNOSIS — Z853 Personal history of malignant neoplasm of breast: Secondary | ICD-10-CM | POA: Insufficient documentation

## 2010-04-18 DIAGNOSIS — C50919 Malignant neoplasm of unspecified site of unspecified female breast: Secondary | ICD-10-CM

## 2010-04-18 DIAGNOSIS — D509 Iron deficiency anemia, unspecified: Secondary | ICD-10-CM | POA: Insufficient documentation

## 2010-04-22 ENCOUNTER — Ambulatory Visit (HOSPITAL_COMMUNITY): Payer: BC Managed Care – PPO | Admitting: Oncology

## 2010-04-22 DIAGNOSIS — C50919 Malignant neoplasm of unspecified site of unspecified female breast: Secondary | ICD-10-CM

## 2010-04-25 LAB — CBC
HCT: 39.9 % (ref 36.0–46.0)
Platelets: 235 10*3/uL (ref 150–400)
RDW: 14.4 % (ref 11.5–15.5)

## 2010-05-01 ENCOUNTER — Ambulatory Visit: Payer: Self-pay | Admitting: Family Medicine

## 2010-05-05 ENCOUNTER — Encounter: Payer: Self-pay | Admitting: Family Medicine

## 2010-05-06 ENCOUNTER — Encounter: Payer: Self-pay | Admitting: Family Medicine

## 2010-05-07 ENCOUNTER — Encounter: Payer: Self-pay | Admitting: Family Medicine

## 2010-05-08 ENCOUNTER — Ambulatory Visit (INDEPENDENT_AMBULATORY_CARE_PROVIDER_SITE_OTHER): Payer: BC Managed Care – PPO | Admitting: Family Medicine

## 2010-05-08 ENCOUNTER — Encounter: Payer: Self-pay | Admitting: Family Medicine

## 2010-05-08 VITALS — BP 130/80 | HR 75 | Resp 16 | Ht 64.5 in | Wt 281.4 lb

## 2010-05-08 DIAGNOSIS — E669 Obesity, unspecified: Secondary | ICD-10-CM

## 2010-05-08 DIAGNOSIS — R7303 Prediabetes: Secondary | ICD-10-CM

## 2010-05-08 DIAGNOSIS — B351 Tinea unguium: Secondary | ICD-10-CM

## 2010-05-08 DIAGNOSIS — C50919 Malignant neoplasm of unspecified site of unspecified female breast: Secondary | ICD-10-CM

## 2010-05-08 DIAGNOSIS — E785 Hyperlipidemia, unspecified: Secondary | ICD-10-CM

## 2010-05-08 DIAGNOSIS — R7309 Other abnormal glucose: Secondary | ICD-10-CM

## 2010-05-08 MED ORDER — CYCLOBENZAPRINE HCL 10 MG PO TABS: 10.0000 mg | ORAL_TABLET | Freq: Two times a day (BID) | ORAL | Status: DC | PRN

## 2010-05-08 MED ORDER — PHENTERMINE HCL 37.5 MG PO TABS: 37.5000 mg | ORAL_TABLET | Freq: Every day | ORAL | Status: DC

## 2010-05-08 MED ORDER — IBUPROFEN 800 MG PO TABS: 800.0000 mg | ORAL_TABLET | Freq: Three times a day (TID) | ORAL | Status: DC | PRN

## 2010-05-08 NOTE — Patient Instructions (Addendum)
F/u in 2 monthe.  Pls follow a low fat 1500 cal diet sheet   Start phentermine Half tablet once daily.  Pls commit to 150 minutes exercise  Per week.  A healthy diet is rich in fruit, vegetables and whole grains. Poultry fish, nuts and beans are a healthy choice for protein rather then red meat. A low sodium diet and drinking 64 ounces of water daily is generally recommended. Oils and sweet should be limited. Carbohydrates especially for those who are diabetic or overweight, should be limited to 34-45 gram per meal. It is important to eat on a regular schedule, at least 3 times daily. Snacks should be primarily fruits, vegetables or nuts.

## 2010-05-08 NOTE — Progress Notes (Signed)
Subjective:    Patient ID: Kathleen Reynolds, female    DOB: [birth year], 50 y.o.   MRN: [id number]  HPI The PT is here for follow up and re-evaluation of chronic medical conditions, medication management and review of recent lab and radiology data.  Preventive health is updated, specifically  Cancer screening, Osteoporosis screening and Immunization.   Questions or concerns regarding consultations or procedures which the PT has had in the interim are  addressed. The PT denies any adverse reactions to current medications since the last visit.   She is concerned about 3 separate episodes when she had severe itching on her hands and feet only, no rash, wheezing or problems swallowing were noted at the time.  She does have fungal toenail infection but wants topical therapy first, will try vicks.  She is frustrated about unsucesful attempts at weight loss, wants to start appetite suppressant and make commitment to lifestyle change.She has also been made aware again that she is actually prediabetic and needs to take this seriously.    Review of Systems  Constitutional: Positive for fatigue. Negative for fever, chills, activity change, appetite change and unexpected weight change.  HENT: Negative for hearing loss, ear pain, congestion, sore throat, rhinorrhea, sneezing, trouble swallowing, neck pain, neck stiffness, postnasal drip and sinus pressure.   Eyes: Negative for photophobia, pain, discharge, redness, itching and visual disturbance.  Respiratory: Negative for cough, chest tightness, shortness of breath and wheezing.   Cardiovascular: Negative for chest pain, palpitations and leg swelling.  Gastrointestinal: Negative for nausea, vomiting, abdominal pain, diarrhea, constipation and blood in stool.  Genitourinary: Negative for dysuria, frequency, hematuria and flank pain.  Musculoskeletal: Positive for back pain. Negative for myalgias, joint swelling, arthralgias and gait problem.  Skin:  Positive for rash. Negative for wound.  Neurological: Negative for dizziness, tremors, seizures, speech difficulty, weakness, numbness and headaches.  Hematological: Negative for adenopathy. Does not bruise/bleed easily.  Psychiatric/Behavioral: Negative for suicidal ideas, hallucinations, behavioral problems, confusion, sleep disturbance and decreased concentration. The patient is not nervous/anxious and is not hyperactive.        Objective:   Physical Exam  [nursing notereviewed. Constitutional: She is oriented to person, place, and time. She appears well-developed and well-nourished.  HENT:  Head: Normocephalic.  Right Ear: External ear normal.  Left Ear: External ear normal.  Mouth/Throat: No oropharyngeal exudate.  Eyes: Conjunctivae and EOM are normal. Right eye exhibits no discharge. Left eye exhibits no discharge. No scleral icterus.  Neck: Normal range of motion. Neck supple. No JVD present. No tracheal deviation present. No thyromegaly present.  Cardiovascular: Normal rate, regular rhythm, normal heart sounds and intact distal pulses.   No murmur heard. Pulmonary/Chest: Effort normal and breath sounds normal. No stridor. No respiratory distress. She has no wheezes. She has no rales. She exhibits no tenderness.  Abdominal: Soft. Bowel sounds are normal. There is no tenderness. There is no rebound and no guarding.  Musculoskeletal: Normal range of motion. She exhibits no edema.  Lymphadenopathy:    She has no cervical adenopathy.  Neurological: She is alert and oriented to person, place, and time. No cranial nerve deficit. Coordination normal.  Skin: Skin is warm and dry. No rash noted. No erythema.       Onychomycosis of 2nd toe, thickened, and evidence of early disease in great toes  Psychiatric: She has a normal mood and affect. Her behavior is normal. Judgment and thought content normal.          Assessment & Plan:  1.Morbid obesity: unchanged, pt counseled for approx  10 mins on lifestyle change, she is to start phentermine also. 2.Hyperlipidemia: Hyperlipidemia:Low fat diet discussed and encouraged. No meds at this time , rept in 4 to 6 months. 3.Onychomycosis:  OTC at this time per pt preference 4.Prediabetes: counseled, and for rept HBA1C today Breast cancer, left: no evidence of recurrence for over 5 yrs.

## 2010-05-09 LAB — HEMOGLOBIN A1C: Mean Plasma Glucose: 128 mg/dL — ABNORMAL HIGH (ref <117)

## 2010-05-13 NOTE — Progress Notes (Signed)
Called patient left message to call office.

## 2010-05-15 LAB — COMPREHENSIVE METABOLIC PANEL
Albumin: 3.9 g/dL (ref 3.5–5.2)
Alkaline Phosphatase: 54 U/L (ref 39–117)
BUN: 14 mg/dL (ref 6–23)
Chloride: 105 mEq/L (ref 96–112)
Glucose, Bld: 81 mg/dL (ref 70–99)
Potassium: 3.8 mEq/L (ref 3.5–5.1)
Total Bilirubin: 0.2 mg/dL — ABNORMAL LOW (ref 0.3–1.2)

## 2010-05-15 LAB — DIFFERENTIAL
Basophils Absolute: 0 10*3/uL (ref 0.0–0.1)
Lymphocytes Relative: 29 % (ref 12–46)
Monocytes Absolute: 0.6 10*3/uL (ref 0.1–1.0)
Monocytes Relative: 7 % (ref 3–12)
Neutro Abs: 5 10*3/uL (ref 1.7–7.7)

## 2010-05-15 LAB — CBC
HCT: 38.3 % (ref 36.0–46.0)
Hemoglobin: 13.1 g/dL (ref 12.0–15.0)
Platelets: 192 10*3/uL (ref 150–400)
WBC: 8.1 10*3/uL (ref 4.0–10.5)

## 2010-05-29 ENCOUNTER — Ambulatory Visit (INDEPENDENT_AMBULATORY_CARE_PROVIDER_SITE_OTHER): Payer: BC Managed Care – PPO | Admitting: Gastroenterology

## 2010-05-29 ENCOUNTER — Encounter: Payer: Self-pay | Admitting: Gastroenterology

## 2010-05-29 DIAGNOSIS — Z862 Personal history of diseases of the blood and blood-forming organs and certain disorders involving the immune mechanism: Secondary | ICD-10-CM

## 2010-05-29 DIAGNOSIS — Z1211 Encounter for screening for malignant neoplasm of colon: Secondary | ICD-10-CM

## 2010-05-29 DIAGNOSIS — E669 Obesity, unspecified: Secondary | ICD-10-CM

## 2010-05-29 NOTE — Progress Notes (Signed)
Cc to PCP 

## 2010-05-29 NOTE — Assessment & Plan Note (Addendum)
Bro had colon CA at age < 51. Currently no GI Sx.  TCS MAY 4 SUPREP.  May need PHENERGAN for sedation.

## 2010-05-29 NOTE — Assessment & Plan Note (Signed)
LAST HFP MAR 2012 NL  Encourage weight loss. OPV in 6 mos.

## 2010-05-29 NOTE — Progress Notes (Signed)
  Subjective:    Patient ID: Kathleen Reynolds, female    DOB: [birth year], 50 y.o.   MRN: [id number]  HPI No rectal bleeding or black stool tarry stool. No weight loss, change in bowel habits, or abd pain. No problems swallowing, heartburn, or indigestion. Appetite: good. No low blood counts. Bms: 1-2 a day. No problems with sedation or prep the last time. LIFE LONG HEAVY MENSES. LMP: FEB 2012. Cycles getting lighter. Takes iron once a day.  Past Medical History  Diagnosis Date  . Breast cancer   . Migraines    Past Surgical History  Procedure Date  . Mastectomy   . Dilation and curettage of uterus   . Colonoscopy 2004 DR. SMITH    No polyp, hemorrhoids   Family History  Problem Relation Age of Onset  . Hypertension Mother   . Cancer Father     lung cancer  . Diabetes Sister   . Cancer Brother     colon cancer  . 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  Mar 2011   Hb 13.8 Ferritin 90 MAR 2010 HB 13.1 FERRITIN 43 NL HFP      Objective:   Physical Exam  Vitals reviewed. Constitutional: She is oriented to person, place, and time. She appears well-developed and well-nourished.  HENT:  Head: Normocephalic and atraumatic.  Eyes: Pupils are equal, round, and reactive to light.  Neck: Normal range of motion. Neck supple.  Cardiovascular: Normal rate and regular rhythm.   Pulmonary/Chest: Effort normal and breath sounds normal.  Abdominal: Soft. Bowel sounds are normal.  Musculoskeletal: Normal range of motion. She exhibits no edema.  Lymphadenopathy:    She has no cervical adenopathy.  Neurological: She is alert and oriented to person, place, and time.  Skin: Skin is warm and dry.  Psychiatric: She has a normal mood and affect. Thought content normal.           Assessment & Plan:

## 2010-05-29 NOTE — Assessment & Plan Note (Signed)
Most likely 2o to menstrual blood loss. Pt may be in early menopause.  OPV in 6 mos and may recheck heme occults in 6-12 mos. Will need EGD only if heme occult pos.

## 2010-05-29 NOTE — Progress Notes (Signed)
Pt aware of OV on 11/26/10 @ 0830 with SF

## 2010-06-06 ENCOUNTER — Ambulatory Visit (HOSPITAL_COMMUNITY)
Admission: RE | Admit: 2010-06-06 | Discharge: 2010-06-06 | Disposition: A | Discharge status: Home / Self Care | Payer: BC Managed Care – PPO | Source: Ambulatory Visit | Attending: Gastroenterology | Admitting: Gastroenterology

## 2010-06-06 ENCOUNTER — Other Ambulatory Visit: Payer: Self-pay | Admitting: Gastroenterology

## 2010-06-06 ENCOUNTER — Encounter: Payer: BC Managed Care – PPO | Admitting: Gastroenterology

## 2010-06-06 DIAGNOSIS — K648 Other hemorrhoids: Secondary | ICD-10-CM | POA: Insufficient documentation

## 2010-06-06 DIAGNOSIS — D126 Benign neoplasm of colon, unspecified: Secondary | ICD-10-CM | POA: Insufficient documentation

## 2010-06-06 DIAGNOSIS — K5730 Diverticulosis of large intestine without perforation or abscess without bleeding: Secondary | ICD-10-CM

## 2010-06-06 DIAGNOSIS — Z800 Family history of malignant neoplasm of digestive organs: Secondary | ICD-10-CM | POA: Insufficient documentation

## 2010-06-06 DIAGNOSIS — Z1211 Encounter for screening for malignant neoplasm of colon: Secondary | ICD-10-CM

## 2010-06-09 HISTORY — PX: COLONOSCOPY: SHX174

## 2010-06-17 NOTE — Op Note (Signed)
[name], [name]         ACCOUNT NO.:  [id number]   MEDICAL RECORD NO.:  [id number]          PATIENT TYPE:  AMB   LOCATION:  SDC                           FACILITY:  [facility]   PHYSICIAN:  Maxie Better, M.D.DATE OF BIRTH:  [birth year]   DATE OF PROCEDURE:  12/17/2006  DATE OF DISCHARGE:                               OPERATIVE REPORT   PREOPERATIVE DIAGNOSIS:  Menorrhagia, endometrial thickening.   PROCEDURE:  Diagnostic hysteroscopy, hysteroscopic resection endometrial  polyps, dilatation and curettage.   POSTOPERATIVE DIAGNOSIS:  Menorrhagia, endometrial polyps.   ANESTHESIA:  General and paracervical block.   SURGEON:  Maxie Better, M.D.   DESCRIPTION OF PROCEDURE:  Under adequate general anesthesia, the  patient was placed in the dorsal lithotomy position.  She was sterilely  prepped and draped in the usual fashion.  The bladder was catheterized  for a moderate amount of urine.  Examination under anesthesia revealed  an enlarged anteverted uterus.  No adnexal masses could be appreciated  but limitation due to the patient's body habitus.  A bivalve speculum  was placed in the vagina.  10 mL of 1% Nesacaine was injected  paracervically.  The anterior lip of the cervix was grasped with a  single tooth tenaculum.  The cervix was then serially dilated up to a  #25 [facility] dilator.  A sorbitol primed hysteroscope was introduced into  the uterine cavity.  Both tubal ostia could be seen.  The left tubal  ostia was sclerosed.  A polypoid lesion was noted in the lower uterine  segment, broad based.  The hysteroscope was removed.  The cavity was  further dilated up to #27 [facility] dilator and a resectoscope was  introduced.  The resectoscope was used to resect the polyp which had  multiple bases.  Other small polyps throughout the uterine cavity was  also noted and removed.  The resectoscope was removed.  The cavity was  curetted.  Additional resection was then performed  until it was felt  that all the polypoid lesions were then removed at which time all  instruments were removed from the vagina.  Specimen labeled endometrial  polyp and endometrial curettings was sent to pathology.  Estimated blood  loss was minimal.  Complication was none.  Fluid deficits of about 200  mL.  The patient tolerated the procedure well and was transferred to the  recovery room in stable condition.      Maxie Better, M.D.  Electronically Signed     [place]/MEDQ  D:  12/17/2006  T:  12/18/2006  Job:  [id number]   cc:   Kathleen Reynolds. Kathleen Reynolds, M.D.  Fax: [phone]

## 2010-06-20 NOTE — H&P (Signed)
[facility]  Patient:    Kathleen Reynolds, Kathleen Reynolds Visit Number: [id number] MRN: [id number]          Service Type: END Location: DAY Attending Physician:  Dessa Phi Dictated by:   Elpidio Anis, M.D. Admit Date:  05/18/2001                           History and Physical  HISTORY OF PRESENT ILLNESS:  This is a 50 year old female with a history of anemia and guaiac-positive stool.  Her sister has a history of colon cancer. The patient is scheduled for colonoscopy.  PAST HISTORY:  She has breast cancer and is status post chemotherapy.  She also has osteoarthritis and migraine headaches.  REVIEW OF SYSTEMS:  Review of systems is negative except for joint pain.  MEDICATIONS: 1. Celebrex 200 mg b.i.d. 2. Prevacid 30 mg q.d.  ALLERGIES:  She has no known drug allergies.  PHYSICAL EXAMINATION:  VITAL SIGNS:  Blood pressure 110/70, pulse 80, respirations 18.  Weight 270 pounds.  HEENT:  Unremarkable.  NECK:  Supple without JVD or bruit.  CHEST:  Clear to auscultation.  HEART:  Regular rate and rhythm without murmur, gallop or rub.  ABDOMEN:  Soft, nontender.  No masses.  RECTAL:  No masses.  Stool guaiac-positive.  EXTREMITIES:  No cyanosis, clubbing or edema.  NEUROLOGIC:  Exam nonfocal.   IMPRESSION: 1. History of rectal bleeding. 2. Progressive anemia. 3. History of breast cancer. 4. Morbid obesity.  PLAN:  Colonoscopy. Dictated by:   Elpidio Anis, M.D. Attending Physician:  Dessa Phi DD:  05/18/01 TD:  05/18/01 Job: [id number]

## 2010-06-20 NOTE — Op Note (Signed)
NAMECHARNIECE, Kathleen Reynolds                     ACCOUNT NO.:  [id number]   MEDICAL RECORD NO.:  [id number]                   PATIENT TYPE:  AMB   LOCATION:  SDC                                  FACILITY:  [facility]   PHYSICIAN:  Maxie Better, M.D.            DATE OF BIRTH:  November 22, 1960   DATE OF PROCEDURE:  04/10/2003  DATE OF DISCHARGE:                                 OPERATIVE REPORT   PREOPERATIVE DIAGNOSES:  1. Missed abortion.  2. Fibroid uterus.   PROCEDURE:  Suction dilation and evacuation.   POSTOPERATIVE DIAGNOSES:  1. Missed abortion.  2. Fibroid uterus.   ANESTHESIA:  MAC, paracervical block.   SURGEON:  Maxie Better, M.D.   INDICATIONS:  This is a 50 year old gravida 2, para 1, female at 8-5/[redacted] weeks  gestation by ultrasound with a documented failed intrauterine pregnancy, who  now presents for surgical management.  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  She also is status post chemotherapy at that time and has been in  remission since 2000.  The patient's history is notable for a large left  adnexal cyst which was found at the time when the patient presented with  some left lower quadrant pain at [facility].  At that time, she had  a viable intrauterine pregnancy.  Subsequent to that, there was no  documented fetal heart rate.  The patient has had no bleeding and prefers  surgical management.  Risks and benefits of the procedure have been  explained.  The consent was signed.  The patient was transferred to the  operating room.   DESCRIPTION OF PROCEDURE:  Under adequate monitored anesthesia, the patient  was placed in the dorsal lithotomy position.  She was sterilely prepped and  draped in the usual manner.  Examination under anesthesia revealed about a  13 week size, regular uterus; left adnexal fullness.  Exam was limited by  the patient's body habitus.  A bivalve  speculum was placed in the vagina.  Nesacaine 20 mL of 1% was injected paracervically at 3 and 9 o'clock.  The  cervix was noted to be a round os with a polypoid lesion which was removed.  The anterior lip of the cervix was grasped with a single-tooth tenaculum.  The cervix was then easily serially dilated up to #31 [facility] dilator.  A #8  mm curved suction cannula was introduced into the uterine cavity without  incident.  A moderate amount of tissue and fluid was obtained.  The cavity  was then subsequently curetted.  During the course of the procedure, gushing  of blood was noted with a steady stream.  The suction and curettage was then  continued in the event that this was secondary to still-retained tissue.  Intravenous Pitocin was given and intramuscular Methergine was also given.  The procedure was continued until all tissue was thought to have  been  removed at which time the suction was then removed from the uterine cavity.  The cervix was monitored.  No additional bleeding was noted, and the  procedure was thought to be complete at which time all instruments were then  removed from the vagina.  Specimen removed products of conception were sent  to pathology.  Estimated blood loss was about 300 mL.  Complication was  none.  The patient tolerated the procedure well, was transferred to the  recovery room in stable condition.                                               Maxie Better, M.D.    /MEDQ  D:  04/10/2003  T:  04/11/2003  Job:  [id number]   cc:   Milus Mallick. Lodema Hong, M.D.  [address]  [place], [place] [address]  Fax: [phone]

## 2010-07-01 NOTE — Op Note (Signed)
  Kathleen Reynolds, Kathleen Reynolds         ACCOUNT NO.:  [id number]  MEDICAL RECORD NO.:  [id number]           PATIENT TYPE:  O  LOCATION:  DAYP                          FACILITY:  [facility]  PHYSICIAN:  Jonette Eva, M.D.     DATE OF BIRTH:  1961/01/30  DATE OF PROCEDURE:  06/06/2010 DATE OF DISCHARGE:                              OPERATIVE REPORT   REFERRING PHYSICIAN:  Milus Mallick. Lodema Hong, MD  PROCEDURE:  Colonoscopy WITH cold forceps polypectomy.  INDICATIONS FOR EXAM:  Kathleen Reynolds is a 50 year old female whose brother had colon cancer at age less than 18.  She presents for colon cancer screening.  FINDINGS: 1. Two 3-mm sessile ascending colon polyps removed via cold forceps. 2. Frequent diverticula seen from the sigmoid to the transverse colon.     Otherwise, no masses, inflammatory changes, or AVMs seen. 3. Moderate internal hemorrhoids.  Otherwise, normal retroflex view of     the rectum.  RECOMMENDATIONS: 1. Screening colonoscopy in 5 years. 2. Will call with the results of her biopsies. 3. No aspirin, NSAIDs, or anticoagulation for 3 days. 4. She should follow a high-fiber diet.  She was given a handout on     high-fiber diet, diverticulosis, hemorrhoids, and polyps.  MEDICATIONS: 1. Demerol 75 mg IV. 2. Versed 4 mg IV. 3. Phenergan 12.5 mg IV.  PROCEDURE TECHNIQUE:  Physical exam was performed.  Informed consent was obtained from the patient after explaining the benefits, risks, and alternatives to the procedure.  The patient was connected to the monitor and placed in the left lateral position.  Continuous oxygen was provided by nasal cannula and IV medicine administered through an indwelling cannula.  After administration of sedation and rectal exam, the patient's rectum was intubated and the scope was advanced under direct visualization to the cecum.  The scope was removed slowly by carefully examining the color, texture, anatomy, and integrity of the mucosa on the  way out.  The patient was recovered in endoscopy and discharged home in satisfactory condition.  PATH: SIMPLE ADENOMA-TCS IN 5 YEARS, HIGH FIBER DIET.   Jonette Eva, M.D.     SF/MEDQ  D:  06/06/2010  T:  06/07/2010  Job:  [id number]  cc:   Milus Mallick. Lodema Hong, M.D. Fax: [phone]  Electronically Signed by Jonette Eva M.D. on 07/01/2010 03:27:40 PM

## 2010-07-09 ENCOUNTER — Encounter: Payer: Self-pay | Admitting: Family Medicine

## 2010-07-10 ENCOUNTER — Ambulatory Visit (INDEPENDENT_AMBULATORY_CARE_PROVIDER_SITE_OTHER): Payer: BC Managed Care – PPO | Admitting: Family Medicine

## 2010-07-10 ENCOUNTER — Encounter: Payer: Self-pay | Admitting: Family Medicine

## 2010-07-10 VITALS — BP 130/80 | HR 82 | Ht 63.75 in | Wt 279.0 lb

## 2010-07-10 DIAGNOSIS — E669 Obesity, unspecified: Secondary | ICD-10-CM

## 2010-07-10 DIAGNOSIS — R7301 Impaired fasting glucose: Secondary | ICD-10-CM

## 2010-07-10 DIAGNOSIS — G4700 Insomnia, unspecified: Secondary | ICD-10-CM

## 2010-07-10 DIAGNOSIS — R5381 Other malaise: Secondary | ICD-10-CM

## 2010-07-10 DIAGNOSIS — S139XXA Sprain of joints and ligaments of unspecified parts of neck, initial encounter: Secondary | ICD-10-CM

## 2010-07-10 DIAGNOSIS — R51 Headache: Secondary | ICD-10-CM

## 2010-07-10 DIAGNOSIS — M79675 Pain in left toe(s): Secondary | ICD-10-CM

## 2010-07-10 DIAGNOSIS — E785 Hyperlipidemia, unspecified: Secondary | ICD-10-CM

## 2010-07-10 DIAGNOSIS — M79609 Pain in unspecified limb: Secondary | ICD-10-CM

## 2010-07-10 DIAGNOSIS — R5383 Other fatigue: Secondary | ICD-10-CM

## 2010-07-10 MED ORDER — PREDNISONE (PAK) 5 MG PO TABS: 5.0000 mg | ORAL_TABLET | ORAL | Status: AC

## 2010-07-10 MED ORDER — METHYLPREDNISOLONE ACETATE 80 MG/ML IJ SUSP
80.0000 mg | Freq: Once | INTRAMUSCULAR | Status: AC
  Administered 2010-07-10: 80 mg via INTRAMUSCULAR

## 2010-07-10 NOTE — Patient Instructions (Addendum)
F/u in 3 month  You will have blood test today for left great toe pain and swelling x 4months, also an xray of the joint. I believe this is arthritis. You will get injection and med for the neck spasm, this should also help the toe pain.  If it does not go away in the next 2 weeks pls  Call for podiatry referral.  If you continue to have severe rash, need to see an allergist, in the meantime avoid pain med except tylenol (NSAIDSince you are not sure if ibuprofen is a problem)   Fasting labs in 3 months  Weight loss goal is 10 pounds. Remember you are prediabetic, so changing your eating is extremely impt to your health  Increase th cyclobenzaprine to 3 times daily for the next 10 to 14 days. Start daily massage and warm compress 2 to 3 times daily to the neck

## 2010-07-10 NOTE — Progress Notes (Signed)
  Subjective:    Patient ID: Kathleen Reynolds, female    DOB: Jun 08, 1960, 50 y.o.   MRN: [id number]  HPI Acute rght neck pain x x 6 days, no aggravating factor, was a 10, has used meds at home with little relief (flexeril twice daily) Pt reports acute itching after taking a ibuprofen tablet last week, she has had intermittent episode in the past , but had not paid as much attention. C/o continuous left great toe pain, concerned as to the cause, debilitating at times, no h/o trauma. No success with weight loss, effort i inconsistent. Headaches are less, and do respond to fioricet. Sleep is improved on med, and good sleep hygiene is practiced   Review of Systems Denies recent fever or chills. Denies sinus pressure, nasal congestion, ear pain or sore throat. Denies chest congestion, productive cough or wheezing. Denies chest pains, palpitations, paroxysmal nocturnal dyspnea, orthopnea and leg swelling Denies abdominal pain, nausea, vomiting,diarrhea or constipation.  Denies rectal bleeding or change in bowel movement. Denies dysuria, frequency, hesitancy or incontinence. Denies, seizure, numbness, or tingling. Denies depression, anxiety or insomnia. Denies skin break down or rash.        Objective:   Physical Exam Patient alert and oriented and in no Cardiopulmonary distress.  HEENT: No facial asymmetry, EOMI, no sinus tenderness, TM's clear, Oropharynx pink and moist.  Neck decreased ROM with left trapezius spasm, no adenopathy.  Chest: Clear to auscultation bilaterally.  CVS: S1, S2 no murmurs, no S3.  ABD: Soft non tender. Bowel sounds normal.  Ext: No edema  MS: Adequate ROM , hips and knees.decreased in neck and shoulder. Left great toe tender with limitation in mobility at first joint  Skin: Intact, no ulcerations or rash noted.  Psych: Good eye contact, normal affect. Memory intact not anxious or depressed appearing.  CNS: CN 2-12 intact, power, tone and sensation  normal throughout.        Assessment & Plan:

## 2010-07-18 ENCOUNTER — Ambulatory Visit (HOSPITAL_COMMUNITY)
Admission: RE | Admit: 2010-07-18 | Discharge: 2010-07-18 | Disposition: A | Discharge status: Home / Self Care | Payer: BC Managed Care – PPO | Source: Ambulatory Visit | Attending: Family Medicine | Admitting: Family Medicine

## 2010-07-18 DIAGNOSIS — Z01812 Encounter for preprocedural laboratory examination: Secondary | ICD-10-CM | POA: Insufficient documentation

## 2010-07-18 DIAGNOSIS — Z01818 Encounter for other preprocedural examination: Secondary | ICD-10-CM | POA: Insufficient documentation

## 2010-07-18 DIAGNOSIS — M79609 Pain in unspecified limb: Secondary | ICD-10-CM | POA: Insufficient documentation

## 2010-07-19 DIAGNOSIS — G4700 Insomnia, unspecified: Secondary | ICD-10-CM | POA: Insufficient documentation

## 2010-07-19 DIAGNOSIS — M79675 Pain in left toe(s): Secondary | ICD-10-CM | POA: Insufficient documentation

## 2010-07-19 NOTE — Assessment & Plan Note (Addendum)
Uric acid level and x ray, pain med as tolerated, likely arthritis, depomedrol administered

## 2010-07-19 NOTE — Assessment & Plan Note (Signed)
Acute onset, muscle relaxants and compresses, tylenol for pain

## 2010-07-19 NOTE — Assessment & Plan Note (Addendum)
continue med as before

## 2010-07-19 NOTE — Assessment & Plan Note (Signed)
Controlled, no change in medication  

## 2010-07-19 NOTE — Assessment & Plan Note (Signed)
Low fat diet discussed and encouraged 

## 2010-07-19 NOTE — Assessment & Plan Note (Signed)
Deteriorated. Patient re-educated about  the importance of commitment to a  minimum of 150 minutes of exercise per week. The importance of healthy food choices with portion control discussed. Encouraged to start a food diary, count calories and to consider  joining a support group. Sample diet sheets offered. Goals set by the patient for the next several months.    

## 2010-07-19 NOTE — Assessment & Plan Note (Signed)
Improved, controlled in acute episode with fioricet

## 2010-08-29 ENCOUNTER — Encounter: Payer: Self-pay | Admitting: Gastroenterology

## 2010-09-17 ENCOUNTER — Encounter: Payer: Self-pay | Admitting: Gastroenterology

## 2010-10-20 ENCOUNTER — Ambulatory Visit: Payer: BC Managed Care – PPO | Admitting: Family Medicine

## 2010-10-27 ENCOUNTER — Other Ambulatory Visit (HOSPITAL_COMMUNITY): Payer: Self-pay | Admitting: Oncology

## 2010-10-27 DIAGNOSIS — Z139 Encounter for screening, unspecified: Secondary | ICD-10-CM

## 2010-10-27 LAB — CBC
HCT: 35 — ABNORMAL LOW
Platelets: 217
WBC: 6.7

## 2010-10-27 LAB — IRON AND TIBC
Iron: 93
Saturation Ratios: 25
TIBC: 377
UIBC: 284

## 2010-10-27 LAB — DIFFERENTIAL
Eosinophils Relative: 2
Lymphocytes Relative: 29
Lymphs Abs: 1.9
Neutro Abs: 4.2

## 2010-10-27 LAB — FERRITIN: Ferritin: 25 (ref 10–291)

## 2010-10-27 LAB — CANCER ANTIGEN 27.29: CA 27.29: 10

## 2010-10-30 ENCOUNTER — Telehealth: Payer: Self-pay | Admitting: Family Medicine

## 2010-10-30 ENCOUNTER — Encounter: Payer: Self-pay | Admitting: Family Medicine

## 2010-10-30 ENCOUNTER — Encounter: Payer: Self-pay | Admitting: *Deleted

## 2010-10-30 ENCOUNTER — Ambulatory Visit (INDEPENDENT_AMBULATORY_CARE_PROVIDER_SITE_OTHER): Payer: BC Managed Care – PPO | Admitting: Family Medicine

## 2010-10-30 VITALS — BP 152/98 | HR 80 | Resp 16 | Ht 64.0 in | Wt 281.1 lb

## 2010-10-30 DIAGNOSIS — M62838 Other muscle spasm: Secondary | ICD-10-CM

## 2010-10-30 DIAGNOSIS — S139XXA Sprain of joints and ligaments of unspecified parts of neck, initial encounter: Secondary | ICD-10-CM

## 2010-10-30 DIAGNOSIS — E669 Obesity, unspecified: Secondary | ICD-10-CM

## 2010-10-30 DIAGNOSIS — E785 Hyperlipidemia, unspecified: Secondary | ICD-10-CM

## 2010-10-30 DIAGNOSIS — C50919 Malignant neoplasm of unspecified site of unspecified female breast: Secondary | ICD-10-CM

## 2010-10-30 DIAGNOSIS — Z23 Encounter for immunization: Secondary | ICD-10-CM

## 2010-10-30 MED ORDER — HYDROCODONE-ACETAMINOPHEN 5-500 MG PO TABS: 1.0000 | ORAL_TABLET | Freq: Three times a day (TID) | ORAL | Status: DC | PRN

## 2010-10-30 MED ORDER — DIAZEPAM 5 MG PO TABS: ORAL_TABLET | ORAL | Status: DC

## 2010-10-30 MED ORDER — INFLUENZA VAC TYPES A & B PF IM SUSP: 0.5000 mL | Freq: Once | INTRAMUSCULAR | Status: DC

## 2010-10-30 MED ORDER — DIAZEPAM 5 MG PO TABS: ORAL_TABLET | ORAL | Status: AC

## 2010-10-30 MED ORDER — METHYLPREDNISOLONE ACETATE 80 MG/ML IJ SUSP
80.0000 mg | Freq: Once | INTRAMUSCULAR | Status: AC
  Administered 2010-10-30: 80 mg via INTRAMUSCULAR

## 2010-10-30 MED ORDER — PREDNISONE (PAK) 5 MG PO TABS: 5.0000 mg | ORAL_TABLET | ORAL | Status: DC

## 2010-10-30 NOTE — Assessment & Plan Note (Signed)
Deteriorated. Patient re-educated about  the importance of commitment to a  minimum of 150 minutes of exercise per week. The importance of healthy food choices with portion control discussed. Encouraged to start a food diary, count calories and to consider  joining a support group. Sample diet sheets offered. Goals set by the patient for the next several months.    

## 2010-10-30 NOTE — Assessment & Plan Note (Signed)
Mammogram upcoming oin November, follows with oncology

## 2010-10-30 NOTE — Assessment & Plan Note (Addendum)
Acute severe spasm, no aggravating factor known, will treat aggressively with anti-inflammatories and muscle relaxants. Pt advised to go to Ed if worsens. Work excuse granted

## 2010-10-30 NOTE — Telephone Encounter (Signed)
Is being worked in for appointment today with Dr Lodema Hong

## 2010-10-30 NOTE — Patient Instructions (Addendum)
F.u in 2 months You are being treated for acute  muscle spasm in your neck, with prednisone, valium and hydrocodne.  If symptoms worsen you need to go to the ED.  Work excuse to return 11/03/2010.  Flu vaccine today

## 2010-10-30 NOTE — Progress Notes (Signed)
  Subjective:    Patient ID: Kathleen Reynolds, female    DOB: 31-Oct-1960, 50 y.o.   MRN: [id number]  HPI 2 day h/o neck pain extending down mid back and to left shoulder with swelling and stiffness of the left trapezius. All movement of the neck and and upper back is severely restricted and painful, movement of the head hurts. Did work yesterday. No associated fever or chills, head or chest congestion, sore throat or ear pain  Review of Systems See HPI Denies recent fever or chills. Denies sinus pressure, nasal congestion, ear pain or sore throat. Denies chest congestion, productive cough or wheezing. Denies palpitations and leg swelling Denies abdominal pain, nausea, vomiting,diarrhea or constipation.   Denies dysuria, frequency, hesitancy or incontinence. Deniesseizures, numbness, or tingling. Denies depression, anxiety or insomnia. Denies skin break down or rash.        Objective:   Physical Exam Patient alert and oriented and in no cardiopulmonary distress.Pt in obvious pain with all neck and upper chest movement  HEENT: No facial asymmetry, EOMI, no sinus tenderness,  oropharynx pink and moist.  Neck decreased ROM, with spasm,  no adenopathy.  Chest: Clear to auscultation bilaterally.daecreased ROM upper chest  CVS: S1, S2 no murmurs, no S3.  ABD: Soft non tender. Bowel sounds normal.  Ext: No edema  NW:GNFAOZHYQ ROM spine and shoulders, adequate in hips and knees  Skin: Intact, no ulcerations or rash noted.  Psych: Good eye contact, normal affect. Memory intact not anxious or depressed appearing.  CNS: CN 2-12 intact, power, tone and sensation normal throughout.        Assessment & Plan:

## 2010-10-30 NOTE — Assessment & Plan Note (Signed)
Hyperlipidemia:Low fat diet discussed and encouraged.  Labs due in November before return

## 2010-11-03 LAB — FERRITIN: Ferritin: 26 (ref 10–291)

## 2010-11-03 LAB — CBC
Hemoglobin: 12.4
Platelets: 213
RDW: 14.9

## 2010-11-11 ENCOUNTER — Telehealth: Payer: Self-pay | Admitting: Gastroenterology

## 2010-11-11 ENCOUNTER — Ambulatory Visit: Payer: BC Managed Care – PPO | Admitting: Gastroenterology

## 2010-11-11 LAB — PREGNANCY, URINE: Preg Test, Ur: NEGATIVE

## 2010-11-11 LAB — CBC
Platelets: 186
RBC: 4.43
WBC: 7.8

## 2010-11-11 NOTE — Telephone Encounter (Signed)
Pt was a no show

## 2010-11-11 NOTE — Telephone Encounter (Signed)
Pt is aware of OV on 11/21 at 1030 with SF and appt card was mailed

## 2010-11-11 NOTE — Telephone Encounter (Signed)
[facility] WITHIN NEXT MONTH.

## 2010-11-13 LAB — FERRITIN: Ferritin: 35 (ref 10–291)

## 2010-11-13 LAB — CBC
HCT: 38.4
Hemoglobin: 13
RBC: 4.21
WBC: 5.1

## 2010-11-19 ENCOUNTER — Ambulatory Visit: Payer: BC Managed Care – PPO | Admitting: Gastroenterology

## 2010-11-26 ENCOUNTER — Ambulatory Visit: Payer: BC Managed Care – PPO | Admitting: Gastroenterology

## 2010-12-11 ENCOUNTER — Ambulatory Visit (HOSPITAL_COMMUNITY)
Admission: RE | Admit: 2010-12-11 | Discharge: 2010-12-11 | Disposition: A | Discharge status: Home / Self Care | Payer: BC Managed Care – PPO | Source: Ambulatory Visit | Attending: Oncology | Admitting: Oncology

## 2010-12-11 DIAGNOSIS — Z139 Encounter for screening, unspecified: Secondary | ICD-10-CM

## 2010-12-11 DIAGNOSIS — Z1231 Encounter for screening mammogram for malignant neoplasm of breast: Secondary | ICD-10-CM | POA: Insufficient documentation

## 2010-12-18 ENCOUNTER — Other Ambulatory Visit (HOSPITAL_COMMUNITY): Payer: Self-pay | Admitting: Oncology

## 2010-12-18 DIAGNOSIS — R928 Other abnormal and inconclusive findings on diagnostic imaging of breast: Secondary | ICD-10-CM

## 2010-12-24 ENCOUNTER — Telehealth: Payer: Self-pay | Admitting: Gastroenterology

## 2010-12-24 ENCOUNTER — Ambulatory Visit: Payer: BC Managed Care – PPO | Admitting: Gastroenterology

## 2010-12-24 NOTE — Telephone Encounter (Signed)
Pt was a no show

## 2010-12-24 NOTE — Telephone Encounter (Signed)
PLEASE CONTACT PT TO [facility]. 

## 2011-01-01 ENCOUNTER — Encounter: Payer: Self-pay | Admitting: Gastroenterology

## 2011-01-01 NOTE — Telephone Encounter (Signed)
Mailed letter to patient to call to [facility] her OV

## 2011-01-07 ENCOUNTER — Ambulatory Visit (HOSPITAL_COMMUNITY)
Admission: RE | Admit: 2011-01-07 | Discharge: 2011-01-07 | Disposition: A | Discharge status: Home / Self Care | Payer: BC Managed Care – PPO | Source: Ambulatory Visit | Attending: Oncology | Admitting: Oncology

## 2011-01-07 ENCOUNTER — Other Ambulatory Visit (HOSPITAL_COMMUNITY): Payer: Self-pay | Admitting: Oncology

## 2011-01-07 DIAGNOSIS — R928 Other abnormal and inconclusive findings on diagnostic imaging of breast: Secondary | ICD-10-CM

## 2011-01-07 DIAGNOSIS — R921 Mammographic calcification found on diagnostic imaging of breast: Secondary | ICD-10-CM

## 2011-01-08 NOTE — Progress Notes (Signed)
Biopsy already scheduled for 01/19/11.

## 2011-01-19 ENCOUNTER — Ambulatory Visit
Admission: RE | Admit: 2011-01-19 | Discharge: 2011-01-19 | Disposition: A | Discharge status: Home / Self Care | Payer: BC Managed Care – PPO | Source: Ambulatory Visit | Attending: Oncology | Admitting: Oncology

## 2011-01-19 ENCOUNTER — Encounter: Payer: Self-pay | Admitting: Family Medicine

## 2011-01-19 ENCOUNTER — Other Ambulatory Visit: Payer: Self-pay | Admitting: Family Medicine

## 2011-01-19 DIAGNOSIS — R921 Mammographic calcification found on diagnostic imaging of breast: Secondary | ICD-10-CM

## 2011-01-20 LAB — TSH: TSH: 5.237 u[IU]/mL — ABNORMAL HIGH (ref 0.350–4.500)

## 2011-01-20 LAB — CBC WITH DIFFERENTIAL/PLATELET
Basophils Absolute: 0 10*3/uL (ref 0.0–0.1)
Basophils Relative: 0 % (ref 0–1)
Eosinophils Relative: 1 % (ref 0–5)
Lymphocytes Relative: 40 % (ref 12–46)
MCHC: 33.2 g/dL (ref 30.0–36.0)
MCV: 92 fL (ref 78.0–100.0)
Platelets: 247 10*3/uL (ref 150–400)
RDW: 15.6 % — ABNORMAL HIGH (ref 11.5–15.5)
WBC: 5 10*3/uL (ref 4.0–10.5)

## 2011-01-20 LAB — T4, FREE: Free T4: 1.07 ng/dL (ref 0.80–1.80)

## 2011-01-20 LAB — T3, FREE: T3, Free: 2.6 pg/mL (ref 2.3–4.2)

## 2011-01-20 LAB — HEMOGLOBIN A1C
Hgb A1c MFr Bld: 6.3 % — ABNORMAL HIGH (ref <5.7)
Mean Plasma Glucose: 134 mg/dL — ABNORMAL HIGH (ref <117)

## 2011-01-20 LAB — LIPID PANEL: HDL: 53 mg/dL (ref >39)

## 2011-01-21 ENCOUNTER — Encounter: Payer: Self-pay | Admitting: Family Medicine

## 2011-01-21 ENCOUNTER — Ambulatory Visit (INDEPENDENT_AMBULATORY_CARE_PROVIDER_SITE_OTHER): Payer: BC Managed Care – PPO | Admitting: Family Medicine

## 2011-01-21 VITALS — BP 118/70 | HR 74 | Resp 16 | Ht 64.0 in | Wt 285.4 lb

## 2011-01-21 DIAGNOSIS — E785 Hyperlipidemia, unspecified: Secondary | ICD-10-CM

## 2011-01-21 DIAGNOSIS — R7309 Other abnormal glucose: Secondary | ICD-10-CM

## 2011-01-21 DIAGNOSIS — E119 Type 2 diabetes mellitus without complications: Secondary | ICD-10-CM

## 2011-01-21 DIAGNOSIS — E669 Obesity, unspecified: Secondary | ICD-10-CM

## 2011-01-21 DIAGNOSIS — R7303 Prediabetes: Secondary | ICD-10-CM

## 2011-01-21 NOTE — Patient Instructions (Addendum)
F/u in 3.5 months.  Hba1C non fasting before next visit. Your HBa1C is up to 6.3, 6.5 is diabetes. You can make this NOT happen by losing weight  All the best for 2013, and please keep me updated with anything you feel the need to communicate.  It is important that you exercise regularly at least 30 minutes 5 times a week. If you develop chest pain, have severe difficulty breathing, or feel very tired, stop exercising immediately and seek medical attention    "My fitness pal" is a free app to assist with counting calories and weight loss

## 2011-01-22 DIAGNOSIS — R7303 Prediabetes: Secondary | ICD-10-CM | POA: Insufficient documentation

## 2011-01-22 NOTE — Assessment & Plan Note (Signed)
Deteriorated. Low fat diet discussed, and encouraged, literature provided. No meds at this time

## 2011-01-22 NOTE — Progress Notes (Signed)
Subjective:     Patient ID: Kathleen Reynolds, female   DOB: 1960-07-06, 50 y.o.   MRN: [id number]  HPI The PT is here for follow up and re-evaluation of chronic medical conditions, medication management and review of any available recent lab and radiology data.  Preventive health is updated, specifically  Cancer screening and Immunization.   Questions or concerns regarding consultations or procedures which the PT has had in the interim are  Addressed.this week pt was advised that she  has a new breast mass on the right , has had left breast cancer 13 years ago, has surgical consultation in the next 2 weeks She has gained weight , but states she has had   a very stressful year, 64 y/o son nearly died and had prolonged illness, and her friend had a stroke . She is increasingly aware of the need to make changes her lifestyle to i mprove her health and states she is commited to this     Review of Systems    See HPI Denies recent fever or chills. Denies sinus pressure, nasal congestion, ear pain or sore throat. Denies chest congestion, productive cough or wheezing. Denies chest pains, palpitations and leg swelling Denies abdominal pain, nausea, vomiting,diarrhea or constipation.   Denies dysuria, frequency, hesitancy or incontinence. Denies joint pain, swelling and limitation in mobility. Denies headaches, seizures, numbness, or tingling. Mild depression and anxiety as she faces recurrent breast cancer Denies skin break down or rash.     Objective:   Physical Exam Patient alert and oriented and in no cardiopulmonary distress.  HEENT: No facial asymmetry, EOMI, no sinus tenderness,  oropharynx pink and moist.  Neck supple no adenopathy.  Chest: Clear to auscultation bilaterally.  CVS: S1, S2 no murmurs, no S3.  ABD: Soft non tender. Bowel sounds normal.  Ext: No edema  MS: Adequate ROM spine, shoulders, hips and knees.  Skin: Intact, no ulcerations or rash noted.  Psych:  Good eye contact, normal affect. Memory intact anxious and mildly depressed appearing.  CNS: CN 2-12 intact, power, tone and sensation normal throughout.     Assessment:         Plan:

## 2011-01-22 NOTE — Assessment & Plan Note (Signed)
Worsened HBA1C significance of this explained, pt declined individual nutritional counseling which is free, at this time

## 2011-01-22 NOTE — Assessment & Plan Note (Signed)
Deteriorated. Patient re-educated about  the importance of commitment to a  minimum of 150 minutes of exercise per week. The importance of healthy food choices with portion control discussed. Encouraged to start a food diary, count calories and to consider  joining a support group. Sample diet sheets offered. Goals set by the patient for the next several months.    

## 2011-02-02 ENCOUNTER — Encounter: Payer: Self-pay | Admitting: Gastroenterology

## 2011-02-03 HISTORY — PX: BREAST LUMPECTOMY: SHX2

## 2011-02-05 ENCOUNTER — Ambulatory Visit (INDEPENDENT_AMBULATORY_CARE_PROVIDER_SITE_OTHER): Payer: BC Managed Care – PPO | Admitting: Otolaryngology

## 2011-02-05 ENCOUNTER — Encounter: Payer: Self-pay | Admitting: Gastroenterology

## 2011-02-05 ENCOUNTER — Ambulatory Visit (INDEPENDENT_AMBULATORY_CARE_PROVIDER_SITE_OTHER): Payer: BC Managed Care – PPO | Admitting: Gastroenterology

## 2011-02-05 VITALS — BP 125/81 | HR 76 | Temp 98.1°F | Ht 64.0 in | Wt 288.6 lb

## 2011-02-05 DIAGNOSIS — D126 Benign neoplasm of colon, unspecified: Secondary | ICD-10-CM

## 2011-02-05 HISTORY — DX: Benign neoplasm of colon, unspecified: D12.6

## 2011-02-05 NOTE — Assessment & Plan Note (Signed)
FAMHx: COLON CA(BRO).  TCS IN 2017 FOLLOW A HIGH FIBER DIET. LOSE WEIGHT. OPV IN 1 YEAR.

## 2011-02-05 NOTE — Progress Notes (Signed)
Cc to PCP 

## 2011-02-05 NOTE — Progress Notes (Signed)
Reminder in epic to follow up in one year °

## 2011-02-05 NOTE — Progress Notes (Signed)
  Subjective:    Patient ID: Kathleen Reynolds, female    DOB: 10/31/1960, 51 y.o.   MRN: [id number]  PCP: [facility]  HPI Taking iron once a day most of the time since seeing Dr. Mariel Sleet. Using Metamucil daily. LAST TCS MAY 2012-SIMPLE ADENOMA. NO BLACK STOOL OR BRBPR. No problems swallowing. BM: depends on what she eats. Mass seen on mammogram. Consultation MON.   Past Medical History  Diagnosis Date  . Breast cancer   . Migraines   . Anemia LIFELONG    HEAVY MENSES MAR 2012 HB 13 MCV 86.7 FERRITIN  66    Past Surgical History  Procedure Date  . Mastectomy   . Dilation and curettage of uterus   . Colonoscopy 2004 DR. SMITH    No polyp, hemorrhoids  . Colonoscopy 06/09/10    simple adenomas     Allergies  Allergen Reactions  . Ibuprofen     Current Outpatient Prescriptions  Medication Sig Dispense Refill  . aspirin (ASPIRIN LOW STRENGTH) 81 MG chewable tablet Chew 81 mg by mouth daily.        . butalbital-acetaminophen-caffeine (FIORICET, ESGIC) 50-325-40 MG per tablet Take 1 tablet by mouth 2 (two) times daily as needed.     . cyclobenzaprine (FLEXERIL) 10 MG tablet Take 1 tablet (10 mg total) by mouth 2 (two) times daily as needed.    . ferrous sulfate (SLOW FE) 160 (50 FE) MG TBCR Take by mouth daily.      . Flaxseed, Linseed, (FLAXSEED OIL) 1000 MG CAPS Take by mouth. 2 tabs daily     . Meclizine HCl 25 MG CHEW Chew by mouth. Take one tablet by mouth three times a day as needed for dizziness     . Multiple Vitamin (MULTIVITAMIN) tablet Take by mouth.      Marland Kitchen HYDROcodone-acetaminophen (VICODIN) 5-500 MG per tablet Take 1 tablet by mouth every 8 (eight) hours as needed for pain.       Review of Systems     Objective:   Physical Exam  Vitals reviewed. Constitutional: She is oriented to person, place, and time. No distress.  HENT:  Head: Normocephalic and atraumatic.  Neck: Normal range of motion. Neck supple.  Cardiovascular: Normal rate, regular rhythm and normal  heart sounds.   Pulmonary/Chest: Breath sounds normal. No respiratory distress.  Abdominal: Soft. Bowel sounds are normal. She exhibits no distension. There is no tenderness.  Neurological: She is alert and oriented to person, place, and time.       NO FOCAL DEFICITS           Assessment & Plan:

## 2011-02-05 NOTE — Patient Instructions (Addendum)
Follow a high fiber diet. AVOID ITEMS THAT CAUSE BLOATING. SEE INFO BELOW. YOU DO NOT NEED TO TAKE IRON. FOLLOW UP IN 1 YEAR.  High-Fiber Diet A high-fiber diet changes your normal diet to include more whole grains, legumes, fruits, and vegetables. Changes in the diet involve replacing refined carbohydrates with unrefined foods. The calorie level of the diet is essentially unchanged. The Dietary Reference Intake (recommended amount) for adult males is 38 grams per day. For adult females, it is 25 grams per day. Pregnant and lactating women should consume 28 grams of fiber per day. Fiber is the intact part of a plant that is not broken down during digestion. Functional fiber is fiber that has been isolated from the plant to provide a beneficial effect in the body. PURPOSE  Increase stool bulk.   Ease and regulate bowel movements.   Lower cholesterol.  INDICATIONS THAT YOU NEED MORE FIBER  Constipation and hemorrhoids.   Uncomplicated diverticulosis (intestine condition) and irritable bowel syndrome.   Weight management.   As a protective measure against hardening of the arteries (atherosclerosis), diabetes, and cancer.   GUIDELINES FOR INCREASING FIBER IN THE DIET  Start adding fiber to the diet slowly. A gradual increase of about 5 more grams (2 slices of whole-wheat bread, 2 servings of most fruits or vegetables, or 1 bowl of high-fiber cereal) per day is best. Too rapid an increase in fiber may result in constipation, flatulence, and bloating.   Drink enough water and fluids to keep your urine clear or pale yellow. Water, juice, or caffeine-free drinks are recommended. Not drinking enough fluid may cause constipation.   Eat a variety of high-fiber foods rather than one type of fiber.   Try to increase your intake of fiber through using high-fiber foods rather than fiber pills or supplements that contain small amounts of fiber.   The goal is to change the types of food eaten. Do  not supplement your present diet with high-fiber foods, but replace foods in your present diet.  INCLUDE A VARIETY OF FIBER SOURCES  Replace refined and processed grains with whole grains, canned fruits with fresh fruits, and incorporate other fiber sources. White rice, white breads, and most bakery goods contain little or no fiber.   Brown whole-grain rice, buckwheat oats, and many fruits and vegetables are all good sources of fiber. These include: broccoli, Brussels sprouts, cabbage, cauliflower, beets, sweet potatoes, white potatoes (skin on), carrots, tomatoes, eggplant, squash, berries, fresh fruits, and dried fruits.   Cereals appear to be the richest source of fiber. Cereal fiber is found in whole grains and bran. Bran is the fiber-rich outer coat of cereal grain, which is largely removed in refining. In whole-grain cereals, the bran remains. In breakfast cereals, the largest amount of fiber is found in those with "bran" in their names. The fiber content is sometimes indicated on the label.   You may need to include additional fruits and vegetables each day.   In baking, for 1 cup white flour, you may use the following substitutions:   1 cup whole-wheat flour minus 2 tablespoons.   1/2 cup white flour plus 1/2 cup whole-wheat flour.

## 2011-02-09 ENCOUNTER — Encounter (INDEPENDENT_AMBULATORY_CARE_PROVIDER_SITE_OTHER): Payer: Self-pay | Admitting: General Surgery

## 2011-02-09 ENCOUNTER — Ambulatory Visit (INDEPENDENT_AMBULATORY_CARE_PROVIDER_SITE_OTHER): Payer: BC Managed Care – PPO | Admitting: General Surgery

## 2011-02-09 ENCOUNTER — Other Ambulatory Visit (INDEPENDENT_AMBULATORY_CARE_PROVIDER_SITE_OTHER): Payer: Self-pay | Admitting: General Surgery

## 2011-02-09 VITALS — BP 132/86 | HR 84 | Temp 96.2°F | Ht 65.0 in | Wt 285.8 lb

## 2011-02-09 DIAGNOSIS — N6310 Unspecified lump in the right breast, unspecified quadrant: Secondary | ICD-10-CM

## 2011-02-09 DIAGNOSIS — N63 Unspecified lump in unspecified breast: Secondary | ICD-10-CM

## 2011-02-09 NOTE — Progress Notes (Signed)
Patient ID: Kathleen Reynolds, female   DOB: 1960/02/24, 51 y.o.   MRN: [id number]  Chief Complaint  Patient presents with  . Pre-op Exam    eval Rt br mass    HPI Peggyann Lague is a 51 y.o. female.  She is referred to me by Dr. Baird Lyons at the breast center for evaluation of a right breast mass, upper inner quadrant. Her primary care physician is Dr. [name]. Her oncologist is Dr. Glenford Peers. Her gynecologist is Dr. Nelta Numbers.  The patient underwent a left mastectomy in 1999 by Dr. Elpidio Anis at [facility]. He put a Port-A-Cath in.   She had postoperative chemotherapy. She did not require radiation therapy. She has not been on antiestrogen therapy. She has no local recurrence to date.  Screening mammograms and ultrasound were performed on December 18, 2010. They show a 1 cm spiculated mass in the upper inner quadrant of the right breast. There are no calcifications. No mass was seen on ultrasound. Her breasts are quite large. She could not have an image guided biopsy because to was too heavy for the procedure table. She was therefore referred to me.  The patient has no complaints about her right breast. She feels no mass. She's had no nipple discharge. HPI  Past Medical History  Diagnosis Date  . Breast cancer   . Migraines   . Anemia LIFELONG    HEAVY MENSES MAR 2012 HB 13 MCV 86.7 FERRITIN  66  . Breast mass in female     right  . Arthritis   . Hyperlipidemia     Past Surgical History  Procedure Date  . Mastectomy   . Dilation and curettage of uterus   . Colonoscopy 2004 DR. SMITH    No polyp, hemorrhoids  . Colonoscopy 06/09/10    simple adenomas   . Hystersonogram 2009  . Dilation and curettage of uterus 05/2002  . Breast surgery 11/1997    Left breast cancer    Family History  Problem Relation Age of Onset  . Hypertension Mother   . Cancer Father     lung cancer  . Diabetes Sister   . Cancer Brother     colon cancer  . Heart  disease Sister     heart attack    Social History History  Substance Use Topics  . Smoking status: Never Smoker   . Smokeless tobacco: Not on file  . Alcohol Use: 1.0 oz/week    2 drink(s) per week     2 glass a week of wine    Allergies  Allergen Reactions  . Ibuprofen     Current Outpatient Prescriptions  Medication Sig Dispense Refill  . aspirin (ASPIRIN LOW STRENGTH) 81 MG chewable tablet Chew 81 mg by mouth daily.        . butalbital-acetaminophen-caffeine (FIORICET, ESGIC) 50-325-40 MG per tablet Take 1 tablet by mouth 2 (two) times daily as needed.       . cyclobenzaprine (FLEXERIL) 10 MG tablet Take 1 tablet (10 mg total) by mouth 2 (two) times daily as needed.  30 tablet  2  . ferrous sulfate (SLOW FE) 160 (50 FE) MG TBCR Take by mouth daily.        . Flaxseed, Linseed, (FLAXSEED OIL) 1000 MG CAPS Take by mouth. 2 tabs daily       . HYDROcodone-acetaminophen (VICODIN) 5-500 MG per tablet Take 1 tablet by mouth every 8 (eight) hours as needed for pain.  30 tablet  0  . Meclizine HCl 25 MG CHEW Chew by mouth. Take one tablet by mouth three times a day as needed for dizziness       . Multiple Vitamin (MULTIVITAMIN) tablet Take by mouth.         Current Facility-Administered Medications  Medication Dose Route Frequency Provider Last Rate Last Dose  . Influenza (>/= 3 years) inactive virus vaccine (FLVIRIN/FLUZONE) injection SUSP 0.5 mL  0.5 mL Intramuscular Once [name], MD        Review of Systems Review of Systems  Constitutional: Negative for fever, chills and unexpected weight change.  HENT: Negative for hearing loss, congestion, sore throat, trouble swallowing and voice change.   Eyes: Negative for visual disturbance.  Respiratory: Negative for cough and wheezing.   Cardiovascular: Negative for chest pain, palpitations and leg swelling.  Gastrointestinal: Negative for nausea, vomiting, abdominal pain, diarrhea, constipation, blood in stool, abdominal  distention and anal bleeding.  Genitourinary: Negative for hematuria, vaginal bleeding and difficulty urinating.  Musculoskeletal: Negative for arthralgias.  Skin: Negative for rash and wound.  Neurological: Positive for light-headedness and headaches. Negative for seizures and syncope.  Hematological: Negative for adenopathy. Does not bruise/bleed easily.  Psychiatric/Behavioral: Negative for confusion.    Blood pressure 132/86, pulse 84, temperature 96.2 F (35.7 C), temperature source Temporal, height 5\' 5"  (1.651 m), weight 285 lb 12.8 oz (129.638 kg), last menstrual period 03/19/2010, SpO2 96.00%.  Physical Exam Physical Exam  Constitutional: She is oriented to person, place, and time. She appears well-developed and well-nourished. No distress.  HENT:  Head: Normocephalic and atraumatic.  Nose: Nose normal.  Mouth/Throat: No oropharyngeal exudate.  Eyes: Conjunctivae and EOM are normal. Pupils are equal, round, and reactive to light. Left eye exhibits no discharge. No scleral icterus.  Neck: Neck supple. No JVD present. No tracheal deviation present. No thyromegaly present.  Cardiovascular: Normal rate, regular rhythm, normal heart sounds and intact distal pulses.   No murmur heard. Pulmonary/Chest: Effort normal and breath sounds normal. No respiratory distress. She has no wheezes. She has no rales. She exhibits no tenderness.    Abdominal: Soft. Bowel sounds are normal. She exhibits no distension and no mass. There is no tenderness. There is no rebound and no guarding.  Musculoskeletal: She exhibits no edema and no tenderness.  Lymphadenopathy:    She has no cervical adenopathy.  Neurological: She is alert and oriented to person, place, and time. She exhibits normal muscle tone. Coordination normal.  Skin: Skin is warm. No rash noted. She is not diaphoretic. No erythema. No pallor.  Psychiatric: She has a normal mood and affect. Her behavior is normal. Judgment and thought  content normal.    Data Reviewed I have reviewed her mammograms and her ultrasound report. I have looked at the mammogram films.  Assessment    Right breast mass, upper inner quadrant, somewhat suspicious for early breast cancer.  Remote history left mastectomy for invasive cancer, details of treatment not specifically known. No evidence of local recurrence.  Morbid obesity  Headaches.  Chronic joint pain    Plan    Will schedule for a right partial mastectomy, right breast biopsy with needle localization in the near future.  I discussed the indications and details of surgery with her and her sister. Risks and complications were outlined, including but not limited to bleeding, infection, cosmetic deformity, further surgery if this turns out to be cancer, nerve damage with chronic pain, cardiac pulmonary and thromboembolic problems. She understands these issues.  Her questions are answered. She agrees with this plan.    Angelia Mould. Derrell Lolling, M.D., [facility] General and Minimally invasive Surgery Breast and Colorectal Surgery Office:   [phone] Pager:   [phone]     02/09/2011, 3:07 PM

## 2011-02-09 NOTE — Patient Instructions (Signed)
He will be scheduled for surgery to remove the spot in your right breast. This will be scheduled as a needle localization and excision biopsy.  Breast Biopsy WHY YOU NEED A BIOPSY Your caregiver has recommended that you have a breast tissue sample taken (biopsy). This is done to be certain that the lump or abnormality found in your breast is not cancerous (malignant). During a biopsy, a small piece of tissue is removed, so it can be examined under a microscope by a specialist (pathologist) who looks at tissues and cells and diagnoses abnormalities in them. Most lumps (tumors) or abnormalities, on or in the breast, are not cancerous (benign). However, biopsies are taken when your caregiver cannot be absolutely certain of what is wrong only from doing a physical exam, mammogram (breast X-ray), or other studies. A breast biopsy can tell you whether nothing more needs to be done, or you need more surgery or another type of treatment. A biopsy is done when there is:  Any undiagnosed breast mass.   Nipple abnormalities, dimpling, crusting, or ulcerations.   Calcium deposits (calcifications) or abnormalities seen on your mammogram, ultrasound, or MRI.   Suspicious changes in the breast (thickening, asymmetry) seen on mammogram.   Abnormal discharge from the nipple, especially blood.   Redness, swelling, and pain of the breast.  HOW A BIOPSY IS PERFORMED A biopsy is often performed on an outpatient basis (you go home the same day). This can be done in a hospital, clinic, or surgical center. Tissue samples (biopsies) are often done under local anesthesia (area is numbed). Sometimes general anesthetics are required, in which case you sleep through the procedure. Biopsies may remove the entire lump, a small piece of the lump, or a small sliver of tissue removed by needle. TYPES OF BREAST BIOPSY  Fine needle aspiration. A thin needle is placed through the skin, to the lump or cyst, and cells are removed.     Core needle biopsy. A large needle with a special tip is placed through the skin, to the abnormality, and a piece of tissue is removed.   Stereotactic biopsy. A core needle with a special X-ray is used, to direct the needle to the lump or abnormal area, which is difficult to feel or cannot be felt.   Vacuum-assisted biopsy. A hollow probe and a gentle vacuum remove a sample of tissue.   Ultrasound guided core needle biopsy. You lie on your stomach, with your breast through an opening, and a high frequency ultrasound helps guide the needle to the area of the abnormality.   Open biopsy. An incision is made in the breast, and a piece of the lump or the whole lump is removed.  LET YOUR CAREGIVER KNOW ABOUT:  Allergies.   Medicines taken, including herbs, eye drops, over-the-counter medicines, and creams.   Use of steroids (by mouth or creams).   Previous problems with anesthetics or Novocaine.   If you are taking aspirin or blood thinners.   Possibility of pregnancy, if this applies.   History of blood clots (thrombophlebitis).   History of bleeding or blood problems.   Previous surgery.   Other health problems.  RISKS AND COMPLICATIONS   Bleeding.   Infection.   Allergy to medicines.   Bruising and swelling of the breast.   Alteration in the shape of the breast.   Not finding the lump or abnormality.   Needing more surgery.  BEFORE THE PROCEDURE  You should arrive 60 minutes prior to your procedure  or as directed.   Check-in at the admissions desk, to fill out necessary forms, if you are not preregistered.   There will be consent forms to sign, prior to the procedure.   There is a waiting area for your family, while you are having your biopsy.   Try to have someone with you, to drive you home.   Do not smoke for 2 weeks before the surgery.   Let your caregiver know if you develop a cold or an infection.   Do not drink alcohol for at least 24 hours before  surgery.   Wear a good support bra to the surgery.  AFTER THE PROCEDURE  After surgery, you will be taken to the recovery area, where a nurse will watch and check your progress. Once you are awake, stable, and taking fluids well, if there are no other problems, you will be allowed to go home.   Ice packs applied to your operative site may help with discomfort and keep the swelling down.   You may resume normal diet and activities as directed. Avoid strenuous activities affecting the arm on the side of the biopsy, such as tennis, swimming, heavy lifting (more than 10 pounds) or pulling.   Bruising in the breast is normal following this procedure.   Wearing a support bra, even to bed, may be more comfortable. The bra will also help keep the dressing on.   Change dressings as directed.   Your doctor may apply a pressure dressing on your breast for 24 to 48 hours.   Only take over-the-counter or prescription medicines for pain, discomfort, or fever as directed by your caregiver.   Do not take aspirin, because it can cause bleeding.  HOME CARE INSTRUCTIONS   You may resume your usual diet.   Have someone drive you home after the surgery.   Do not do any exercise, driving, lifting or general activities without your caregiver's permission.   Take medicines and over-the-counter medicines, as ordered by your caregiver.   Keep your postoperative appointments as recommended.   Do not drink alcohol while taking pain medicine.  Finding out the results of your test Not all test results are available during your visit. If your test results are not back during the visit, make an appointment with your caregiver to find out the results. Do not assume everything is normal if you have not heard from your caregiver or the medical facility. It is important for you to follow up on all of your test results.  SEEK MEDICAL CARE IF:   You notice redness, swelling, or increasing pain in the wound.   You  notice a bad smell coming from the wound or dressing.   You develop a rash.   You need stronger pain medicine.   You are having an allergic reaction or problems with your medicines.  SEEK IMMEDIATE MEDICAL CARE IF:   You have difficulty breathing.   You have a fever.   There is increased bleeding (more than a small spot) from the wound.   Pus is coming from the wound.   The wound is breaking open.  Document Released: 01/19/2005 Document Revised: 10/01/2010 Document Reviewed: 12/07/2008 [facility] Patient Information 2012 [place], [place].

## 2011-02-11 ENCOUNTER — Encounter (HOSPITAL_BASED_OUTPATIENT_CLINIC_OR_DEPARTMENT_OTHER): Payer: Self-pay | Admitting: *Deleted

## 2011-02-11 NOTE — Progress Notes (Signed)
Pt denies htn,diabetes,sleep apnea No labs needed

## 2011-02-11 NOTE — Progress Notes (Signed)
Dr Derrell Lolling did order labs-pt to come in for those

## 2011-02-12 ENCOUNTER — Encounter (HOSPITAL_BASED_OUTPATIENT_CLINIC_OR_DEPARTMENT_OTHER)
Admission: RE | Admit: 2011-02-12 | Discharge: 2011-02-12 | Disposition: A | Discharge status: Home / Self Care | Payer: BC Managed Care – PPO | Source: Ambulatory Visit | Attending: General Surgery | Admitting: General Surgery

## 2011-02-12 LAB — DIFFERENTIAL
Eosinophils Absolute: 0.1 10*3/uL (ref 0.0–0.7)
Lymphs Abs: 2 10*3/uL (ref 0.7–4.0)
Monocytes Relative: 5 % (ref 3–12)
Neutro Abs: 3.6 10*3/uL (ref 1.7–7.7)
Neutrophils Relative %: 60 % (ref 43–77)

## 2011-02-12 LAB — COMPREHENSIVE METABOLIC PANEL
Albumin: 4 g/dL (ref 3.5–5.2)
Alkaline Phosphatase: 63 U/L (ref 39–117)
BUN: 11 mg/dL (ref 6–23)
Potassium: 4.3 mEq/L (ref 3.5–5.1)
Sodium: 135 mEq/L (ref 135–145)
Total Protein: 7.7 g/dL (ref 6.0–8.3)

## 2011-02-12 LAB — CBC
HCT: 39.2 % (ref 36.0–46.0)
MCHC: 34.9 g/dL (ref 30.0–36.0)
RDW: 14.7 % (ref 11.5–15.5)

## 2011-02-12 NOTE — H&P (Signed)
[name] MRN: [id number]   Description: 51 year old female  Provider: Ernestene Mention, MD  Department: [facility]    Diagnoses     Breast mass, right   - Primary    611.72         History and Physical    Ernestene Mention, MD   Patient ID: Kathleen Reynolds, female   DOB: 01-21-1961, 51 y.o.   MRN: [id number]    Chief Complaint   Patient presents with   .  Pre-op Exam       eval Rt br mass      HPI  Kathleen Reynolds is a 51 y.o. female.  She is referred to me by Dr. Baird Lyons at the breast center for evaluation of a right breast mass, upper inner quadrant. Her primary care physician is Dr. [name]. Her oncologist is Dr. Glenford Peers. Her gynecologist is Dr. Nelta Numbers.  The patient underwent a left mastectomy in 1999 by Dr. Elpidio Anis at [facility]. He put a Port-A-Cath in.   She had postoperative chemotherapy. She did not require radiation therapy. She has not been on antiestrogen therapy. She has no local recurrence to date.  Screening mammograms and ultrasound were performed on December 18, 2010. They show a 1 cm spiculated mass in the upper inner quadrant of the right breast. There are no calcifications. No mass was seen on ultrasound. Her breasts are quite large. She could not have an image guided biopsy because to was too heavy for the procedure table. She was therefore referred to me.  The patient has no complaints about her right breast. She feels no mass. She's had no nipple discharge.    Past Medical History   Diagnosis  Date   .  Breast cancer     .  Migraines     .  Anemia  LIFELONG       HEAVY MENSES MAR 2012 HB 13 MCV 86.7 FERRITIN  66   .  Breast mass in female         right   .  Arthritis     .  Hyperlipidemia         Past Surgical History   Procedure  Date   .  Mastectomy     .  Dilation and curettage of uterus     .  Colonoscopy  2004 DR. SMITH       No polyp, hemorrhoids   .  Colonoscopy  06/09/10     simple adenomas    .  Hystersonogram  2009   .  Dilation and curettage of uterus  05/2002   .  Breast surgery  11/1997       Left breast cancer       Family History   Problem  Relation  Age of Onset   .  Hypertension  Mother     .  Cancer  Father         lung cancer   .  Diabetes  Sister     .  Cancer  Brother         colon cancer   .  Heart disease  Sister         heart attack      Social History History   Substance Use Topics   .  Smoking status:  Never Smoker    .  Smokeless tobacco:  Not on file   .  Alcohol Use:  1.0 oz/week  2 drink(s) per week         2 glass a week of wine       Allergies   Allergen  Reactions   .  Ibuprofen         Current Outpatient Prescriptions    Medication  Sig  Dispense  Refill   .  aspirin (ASPIRIN LOW STRENGTH) 81 MG chewable tablet  Chew 81 mg by mouth daily.           .  butalbital-acetaminophen-caffeine (FIORICET, ESGIC) 50-325-40 MG per tablet  Take 1 tablet by mouth 2 (two) times daily as needed.          .  cyclobenzaprine (FLEXERIL) 10 MG tablet  Take 1 tablet (10 mg total) by mouth 2 (two) times daily as needed.   30 tablet   2   .  ferrous sulfate (SLOW FE) 160 (50 FE) MG TBCR  Take by mouth daily.           .  Flaxseed, Linseed, (FLAXSEED OIL) 1000 MG CAPS  Take by mouth. 2 tabs daily          .  HYDROcodone-acetaminophen (VICODIN) 5-500 MG per tablet  Take 1 tablet by mouth every 8 (eight) hours as needed for pain.   30 tablet   0   .  Meclizine HCl 25 MG CHEW  Chew by mouth. Take one tablet by mouth three times a day as needed for dizziness          .  Multiple Vitamin (MULTIVITAMIN) tablet  Take by mouth.               Current Facility-Administered Medications   Medication  Dose  Route  Frequency  Provider  Last Rate  Last Dose   .  Influenza (>/= 3 years) inactive virus vaccine (FLVIRIN/FLUZONE) injection SUSP 0.5 mL   0.5 mL  Intramuscular  Once  [name], MD            ROS   Constitutional: Negative  for fever, chills and unexpected weight change.  HENT: Negative for hearing loss, congestion, sore throat, trouble swallowing and voice change.   Eyes: Negative for visual disturbance.  Respiratory: Negative for cough and wheezing.   Cardiovascular: Negative for chest pain, palpitations and leg swelling.  Gastrointestinal: Negative for nausea, vomiting, abdominal pain, diarrhea, constipation, blood in stool, abdominal distention and anal bleeding.  Genitourinary: Negative for hematuria, vaginal bleeding and difficulty urinating.  Musculoskeletal: Negative for arthralgias.  Skin: Negative for rash and wound.  Neurological: Positive for light-headedness and headaches. Negative for seizures and syncope.  Hematological: Negative for adenopathy. Does not bruise/bleed easily.  Psychiatric/Behavioral: Negative for confusion.    Blood pressure 132/86, pulse 84, temperature 96.2 F (35.7 C), temperature source Temporal, height 5\' 5"  (1.651 m), weight 285 lb 12.8 oz (129.638 kg), last menstrual period 03/19/2010, SpO2 96.00%.   Physical Exam  Constitutional: She is oriented to person, place, and time. She appears well-developed and well-nourished. No distress.  HENT:   Head: Normocephalic and atraumatic.   Nose: Nose normal.   Mouth/Throat: No oropharyngeal exudate.  Eyes: Conjunctivae and EOM are normal. Pupils are equal, round, and reactive to light. Left eye exhibits no discharge. No scleral icterus.  Neck: Neck supple. No JVD present. No tracheal deviation present. No thyromegaly present.  Cardiovascular: Normal rate, regular rhythm, normal heart sounds and intact distal pulses.    No murmur heard. Pulmonary/Chest: Effort normal and breath sounds normal. No  respiratory distress. She has no wheezes. She has no rales. She exhibits no tenderness.    Abdominal: Soft. Bowel sounds are normal. She exhibits no distension and no mass. There is no tenderness. There is no rebound and no guarding.    Musculoskeletal: She exhibits no edema and no tenderness.  Lymphadenopathy:    She has no cervical adenopathy.  Neurological: She is alert and oriented to person, place, and time. She exhibits normal muscle tone. Coordination normal.  Skin: Skin is warm. No rash noted. She is not diaphoretic. No erythema. No pallor.  Psychiatric: She has a normal mood and affect. Her behavior is normal. Judgment and thought content normal.    Data Reviewed I have reviewed her mammograms and her ultrasound report. I have looked at the mammogram films.  Assessment   Right breast mass, upper inner quadrant, somewhat suspicious for early breast cancer.  Remote history left mastectomy for invasive cancer, details of treatment not specifically known. No evidence of local recurrence.  Morbid obesity  Headaches.  Chronic joint pain   Plan  Will schedule for a right partial mastectomy, right breast biopsy with needle localization in the near future.  I discussed the indications and details of surgery with her and her sister. Risks and complications were outlined, including but not limited to bleeding, infection, cosmetic deformity, further surgery if this turns out to be cancer, nerve damage with chronic pain, cardiac pulmonary and thromboembolic problems. She understands these issues. Her questions are answered. She agrees with this plan.   Angelia Mould. Derrell Lolling, M.D., [facility] General and Minimally invasive Surgery Breast and Colorectal Surgery Office:   [phone] Pager:   [phone]  02/12/2011 2:10 PM

## 2011-02-13 ENCOUNTER — Encounter (HOSPITAL_BASED_OUTPATIENT_CLINIC_OR_DEPARTMENT_OTHER): Payer: Self-pay | Admitting: Certified Registered Nurse Anesthetist

## 2011-02-13 ENCOUNTER — Ambulatory Visit
Admission: RE | Admit: 2011-02-13 | Discharge: 2011-02-13 | Disposition: A | Discharge status: Home / Self Care | Payer: BC Managed Care – PPO | Source: Ambulatory Visit | Attending: General Surgery | Admitting: General Surgery

## 2011-02-13 ENCOUNTER — Ambulatory Visit (HOSPITAL_BASED_OUTPATIENT_CLINIC_OR_DEPARTMENT_OTHER): Payer: BC Managed Care – PPO | Admitting: Certified Registered Nurse Anesthetist

## 2011-02-13 ENCOUNTER — Ambulatory Visit (HOSPITAL_BASED_OUTPATIENT_CLINIC_OR_DEPARTMENT_OTHER)
Admission: RE | Admit: 2011-02-13 | Discharge: 2011-02-13 | Disposition: A | Discharge status: Home Health | Payer: BC Managed Care – PPO | Source: Ambulatory Visit | Attending: General Surgery | Admitting: General Surgery

## 2011-02-13 ENCOUNTER — Encounter (HOSPITAL_BASED_OUTPATIENT_CLINIC_OR_DEPARTMENT_OTHER): Payer: Self-pay

## 2011-02-13 ENCOUNTER — Other Ambulatory Visit (INDEPENDENT_AMBULATORY_CARE_PROVIDER_SITE_OTHER): Payer: Self-pay | Admitting: General Surgery

## 2011-02-13 ENCOUNTER — Encounter (HOSPITAL_BASED_OUTPATIENT_CLINIC_OR_DEPARTMENT_OTHER)
Admission: RE | Disposition: A | Discharge status: Home Health | Payer: Self-pay | Source: Ambulatory Visit | Attending: General Surgery

## 2011-02-13 DIAGNOSIS — C50219 Malignant neoplasm of upper-inner quadrant of unspecified female breast: Secondary | ICD-10-CM | POA: Insufficient documentation

## 2011-02-13 DIAGNOSIS — D0590 Unspecified type of carcinoma in situ of unspecified breast: Secondary | ICD-10-CM

## 2011-02-13 DIAGNOSIS — R51 Headache: Secondary | ICD-10-CM | POA: Insufficient documentation

## 2011-02-13 DIAGNOSIS — Z01812 Encounter for preprocedural laboratory examination: Secondary | ICD-10-CM | POA: Insufficient documentation

## 2011-02-13 DIAGNOSIS — N6310 Unspecified lump in the right breast, unspecified quadrant: Secondary | ICD-10-CM

## 2011-02-13 DIAGNOSIS — Z853 Personal history of malignant neoplasm of breast: Secondary | ICD-10-CM | POA: Insufficient documentation

## 2011-02-13 HISTORY — PX: BREAST BIOPSY: SHX20

## 2011-02-13 SURGERY — BREAST BIOPSY WITH NEEDLE LOCALIZATION
Anesthesia: General | Site: Breast | Laterality: Right | Wound class: Clean

## 2011-02-13 MED ORDER — LACTATED RINGERS IV SOLN
INTRAVENOUS | Status: DC
  Administered 2011-02-13 (×2): via INTRAVENOUS

## 2011-02-13 MED ORDER — ONDANSETRON HCL 4 MG/2ML IJ SOLN
INTRAMUSCULAR | Status: DC | PRN
  Administered 2011-02-13: 4 mg via INTRAVENOUS

## 2011-02-13 MED ORDER — HEPARIN SODIUM (PORCINE) 5000 UNIT/ML IJ SOLN
5000.0000 [IU] | Freq: Once | INTRAMUSCULAR | Status: AC
  Administered 2011-02-13: 5000 [IU] via SUBCUTANEOUS

## 2011-02-13 MED ORDER — DEXAMETHASONE SODIUM PHOSPHATE 4 MG/ML IJ SOLN
INTRAMUSCULAR | Status: DC | PRN
  Administered 2011-02-13: 4 mg via INTRAVENOUS

## 2011-02-13 MED ORDER — ACETAMINOPHEN 10 MG/ML IV SOLN
1000.0000 mg | Freq: Once | INTRAVENOUS | Status: AC
  Administered 2011-02-13: 1000 mg via INTRAVENOUS

## 2011-02-13 MED ORDER — HYDROCODONE-ACETAMINOPHEN 5-325 MG PO TABS: 1.0000 | ORAL_TABLET | ORAL | Status: AC | PRN

## 2011-02-13 MED ORDER — CHLORHEXIDINE GLUCONATE 4 % EX LIQD: 1.0000 "application " | Freq: Once | CUTANEOUS | Status: DC

## 2011-02-13 MED ORDER — LIDOCAINE HCL (CARDIAC) 20 MG/ML IV SOLN
INTRAVENOUS | Status: DC | PRN
  Administered 2011-02-13: 60 mg via INTRAVENOUS

## 2011-02-13 MED ORDER — DROPERIDOL 2.5 MG/ML IJ SOLN
INTRAMUSCULAR | Status: DC | PRN
  Administered 2011-02-13: 0.625 mg via INTRAVENOUS

## 2011-02-13 MED ORDER — MIDAZOLAM HCL 2 MG/2ML IJ SOLN: 0.5000 mg | INTRAMUSCULAR | Status: DC | PRN

## 2011-02-13 MED ORDER — FENTANYL CITRATE 0.05 MG/ML IJ SOLN
INTRAMUSCULAR | Status: DC | PRN
  Administered 2011-02-13: 25 ug via INTRAVENOUS
  Administered 2011-02-13 (×2): 50 ug via INTRAVENOUS

## 2011-02-13 MED ORDER — BUPIVACAINE-EPINEPHRINE 0.5% -1:200000 IJ SOLN
INTRAMUSCULAR | Status: DC | PRN
  Administered 2011-02-13: 13 mL

## 2011-02-13 MED ORDER — PROPOFOL 10 MG/ML IV EMUL
INTRAVENOUS | Status: DC | PRN
  Administered 2011-02-13: 260 mg via INTRAVENOUS

## 2011-02-13 MED ORDER — EPHEDRINE SULFATE 50 MG/ML IJ SOLN
INTRAMUSCULAR | Status: DC | PRN
  Administered 2011-02-13 (×4): 10 mg via INTRAVENOUS

## 2011-02-13 MED ORDER — CEFAZOLIN SODIUM-DEXTROSE 2-3 GM-% IV SOLR
2.0000 g | INTRAVENOUS | Status: AC
  Administered 2011-02-13: 2 g via INTRAVENOUS

## 2011-02-13 MED ORDER — MIDAZOLAM HCL 5 MG/5ML IJ SOLN
INTRAMUSCULAR | Status: DC | PRN
  Administered 2011-02-13: 2 mg via INTRAVENOUS

## 2011-02-13 SURGICAL SUPPLY — 58 items
ADH SKN CLS APL DERMABOND .7 (GAUZE/BANDAGES/DRESSINGS) ×1
APL SKNCLS STERI-STRIP NONHPOA (GAUZE/BANDAGES/DRESSINGS)
BANDAGE ELASTIC 6 VELCRO ST LF (GAUZE/BANDAGES/DRESSINGS) IMPLANT
BENZOIN TINCTURE PRP APPL 2/3 (GAUZE/BANDAGES/DRESSINGS) IMPLANT
BLADE HEX COATED 2.75 (ELECTRODE) ×2 IMPLANT
BLADE SURG 15 STRL LF DISP TIS (BLADE) ×2 IMPLANT
BLADE SURG 15 STRL SS (BLADE) ×4
CANISTER SUCTION 1200CC (MISCELLANEOUS) ×2 IMPLANT
CHLORAPREP W/TINT 26ML (MISCELLANEOUS) ×2 IMPLANT
CLOTH BEACON ORANGE TIMEOUT ST (SAFETY) ×2 IMPLANT
COVER MAYO STAND STRL (DRAPES) ×2 IMPLANT
COVER TABLE BACK 60X90 (DRAPES) ×2 IMPLANT
DECANTER SPIKE VIAL GLASS SM (MISCELLANEOUS) IMPLANT
DERMABOND ADVANCED (GAUZE/BANDAGES/DRESSINGS) ×1
DERMABOND ADVANCED .7 DNX12 (GAUZE/BANDAGES/DRESSINGS) IMPLANT
DEVICE DUBIN W/COMP PLATE 8390 (MISCELLANEOUS) ×1 IMPLANT
DRAPE LAPAROTOMY TRNSV 102X78 (DRAPE) IMPLANT
DRAPE PED LAPAROTOMY (DRAPES) ×2 IMPLANT
DRAPE UTILITY XL STRL (DRAPES) ×2 IMPLANT
ELECT REM PT RETURN 9FT ADLT (ELECTROSURGICAL) ×2
ELECTRODE REM PT RTRN 9FT ADLT (ELECTROSURGICAL) ×1 IMPLANT
GAUZE SPONGE 4X4 12PLY STRL LF (GAUZE/BANDAGES/DRESSINGS) IMPLANT
GAUZE SPONGE 4X4 16PLY XRAY LF (GAUZE/BANDAGES/DRESSINGS) IMPLANT
GLOVE BIO SURGEON STRL SZ 6.5 (GLOVE) ×1 IMPLANT
GLOVE BIO SURGEON STRL SZ7 (GLOVE) ×1 IMPLANT
GLOVE BIOGEL PI IND STRL 7.5 (GLOVE) ×1 IMPLANT
GLOVE BIOGEL PI INDICATOR 7.5 (GLOVE) ×1
GLOVE EUDERMIC 7 POWDERFREE (GLOVE) ×2 IMPLANT
GOWN PREVENTION PLUS XLARGE (GOWN DISPOSABLE) ×2 IMPLANT
GOWN PREVENTION PLUS XXLARGE (GOWN DISPOSABLE) ×3 IMPLANT
KIT MARKER MARGIN INK (KITS) ×1 IMPLANT
NDL HYPO 25X1 1.5 SAFETY (NEEDLE) ×1 IMPLANT
NEEDLE HYPO 22GX1.5 SAFETY (NEEDLE) IMPLANT
NEEDLE HYPO 25X1 1.5 SAFETY (NEEDLE) ×2 IMPLANT
NS IRRIG 1000ML POUR BTL (IV SOLUTION) ×2 IMPLANT
PACK BASIN DAY SURGERY FS (CUSTOM PROCEDURE TRAY) ×2 IMPLANT
PENCIL BUTTON HOLSTER BLD 10FT (ELECTRODE) ×2 IMPLANT
SLEEVE SCD COMPRESS KNEE MED (MISCELLANEOUS) ×1 IMPLANT
SPONGE LAP 4X18 X RAY DECT (DISPOSABLE) ×2 IMPLANT
STAPLER VISISTAT 35W (STAPLE) IMPLANT
STRIP CLOSURE SKIN 1/2X4 (GAUZE/BANDAGES/DRESSINGS) IMPLANT
SUT ETHILON 4 0 PS 2 18 (SUTURE) IMPLANT
SUT MON AB 4-0 PC3 18 (SUTURE) ×1 IMPLANT
SUT SILK 2 0 SH (SUTURE) ×2 IMPLANT
SUT VIC AB 2-0 SH 27 (SUTURE)
SUT VIC AB 2-0 SH 27XBRD (SUTURE) IMPLANT
SUT VIC AB 3-0 FS2 27 (SUTURE) IMPLANT
SUT VIC AB 4-0 P-3 18XBRD (SUTURE) IMPLANT
SUT VIC AB 4-0 P3 18 (SUTURE)
SUT VICRYL 3-0 CR8 SH (SUTURE) ×2 IMPLANT
SUT VICRYL 4-0 PS2 18IN ABS (SUTURE) IMPLANT
SYR BULB 3OZ (MISCELLANEOUS) IMPLANT
SYR CONTROL 10ML LL (SYRINGE) ×2 IMPLANT
TAPE HYPAFIX 4 X10 (GAUZE/BANDAGES/DRESSINGS) IMPLANT
TOWEL OR NON WOVEN STRL DISP B (DISPOSABLE) ×2 IMPLANT
TUBE CONNECTING 20X1/4 (TUBING) ×2 IMPLANT
WATER STERILE IRR 1000ML POUR (IV SOLUTION) ×1 IMPLANT
YANKAUER SUCT BULB TIP NO VENT (SUCTIONS) ×2 IMPLANT

## 2011-02-13 NOTE — Anesthesia Preprocedure Evaluation (Signed)
Anesthesia Evaluation  Patient identified by MRN, date of birth, ID band Patient awake    Reviewed: Allergy & Precautions, H&P , NPO status , Patient's Chart, lab work & pertinent test results, reviewed documented beta blocker date and time   Airway Mallampati: II TM Distance: >3 FB Neck ROM: full    Dental   Pulmonary neg pulmonary ROS,          Cardiovascular neg cardio ROS     Neuro/Psych  Headaches, Negative Psych ROS   GI/Hepatic negative GI ROS, Neg liver ROS,   Endo/Other  Morbid obesity  Renal/GU negative Renal ROS  Genitourinary negative   Musculoskeletal   Abdominal   Peds  Hematology negative hematology ROS (+)   Anesthesia Other Findings See surgeon's H&P   Reproductive/Obstetrics negative OB ROS                           Anesthesia Physical Anesthesia Plan  ASA: III  Anesthesia Plan: General   Post-op Pain Management:    Induction: Intravenous  Airway Management Planned: LMA  Additional Equipment:   Intra-op Plan:   Post-operative Plan: Extubation in OR  Informed Consent: I have reviewed the patients History and Physical, chart, labs and discussed the procedure including the risks, benefits and alternatives for the proposed anesthesia with the patient or authorized representative who has indicated his/her understanding and acceptance.     Plan Discussed with: CRNA and Surgeon  Anesthesia Plan Comments:         Anesthesia Quick Evaluation

## 2011-02-13 NOTE — Op Note (Signed)
Patient Name:           Kathleen Reynolds   Date of Surgery:        02/13/2011  Pre op Diagnosis:      Right breast mass  Post op Diagnosis:    Right breast mass  Procedure:      Right partial mastectomy with needle localization, reexcision superior margin, inferior margin, and deep margin.       Surgeon:                     Angelia Mould. Derrell Lolling, M.D., FACS  Assistant:                      none  Operative Indications:   This is a 51 year old female who has a history of a left mastectomy in 1999 for breast cancer. She had postoperative chemotherapy. She has no local recurrence to date. She is morbidly obese. Recent screening mammograms ultrasounds show a 1 cm noncalcified density in the upper inner quadrant of the right breast. They were unable to do image guided biopsy because of the patient's weight. She was referred to me. There is no abnormality on exam although her breast is extremely large. She underwent wire localization at [facility] this morning. The wire extends back near the mass and extends a few  centimeters posterior to it. She is brought to the operating room electively .  Operative Findings:      I did not palpate any abnormality in the breast tissue. The specimen mammogram showed a vague density. Because of a certain degree of uncertainty by radiology I chose to re excise the superior margin, the deep margin, and the inferior margin to be sure that we had adequately biopsied the area. Because of the size of her breast we were able do this with minimal cosmetic deformity  Procedure in Detail:          Wire localization procedure was performed at the [facility] [place]. The patient was brought to [facility] Day surgery. She was taken to the operating room. General endotracheal anesthesia was induced. Intravenous antibiotics were given. Surgical time out was held. The right breast was prepped and draped in sterile fashion. 0.5% Marcaine with epinephrine was used  as local infiltration anesthetic.  I reviewed the location of the wire in the breast superiorly. I reviewed the localization films. A transverse curvilinear incision was made superiorly in the right breast. Dissection was carried down deeply into the breast tissue around the localizing wire. No mass was felt. The specimen was removed and marked with the 6 color marking kit. Specimen mammogram showed the wire. The radiologist said the mass was very vague both preop and with the specimen. We marked the specimen where the density was. I reexcised the superior margin, posterior margin, and inferior margin and marked these with ink as well and sent these to the lab as well. I felt no other abnormality out in the breast. The wound was irrigated with saline. Hemostasis was excellent. I closed the breast tissue in 3 layers with 3-0 Vicryl interrupted sutures and a running subcuticular suture of 4-0 Monocryl in the skin. Dermabond was placed. Patient tolerated procedure well. Counts correct. EBL 15 cc. Complications none.     Angelia Mould. Derrell Lolling, M.D., FACS General and Minimally Invasive Surgery Breast and Colorectal Surgery  02/13/2011 10:54 AM

## 2011-02-13 NOTE — Anesthesia Postprocedure Evaluation (Signed)
Anesthesia Post Note  Patient: Chief Technology Officer  Procedure(s) Performed:  BREAST BIOPSY WITH NEEDLE LOCALIZATION  Anesthesia type: General  Patient location: PACU  Post pain: Pain level controlled  Post assessment: Patient's Cardiovascular Status Stable  Last Vitals:  Filed Vitals:   02/13/11 1211  BP: 155/72  Pulse: 96  Temp: 36.4 C  Resp: 20    Post vital signs: Reviewed and stable  Level of consciousness: alert  Complications: No apparent anesthesia complications

## 2011-02-13 NOTE — Anesthesia Procedure Notes (Signed)
Procedure Name: LMA Insertion Date/Time: 02/13/2011 10:01 AM Performed by: Joyice Magda D Pre-anesthesia Checklist: Patient identified, Emergency Drugs available, Suction available and Patient being monitored Patient Re-evaluated:Patient Re-evaluated prior to inductionOxygen Delivery Method: Circle System Utilized Preoxygenation: Pre-oxygenation with 100% oxygen Intubation Type: IV induction Ventilation: Mask ventilation without difficulty LMA: LMA inserted LMA Size: 4.0 Number of attempts: 1 Placement Confirmation: positive ETCO2 Tube secured with: Tape Dental Injury: Teeth and Oropharynx as per pre-operative assessment

## 2011-02-13 NOTE — Transfer of Care (Signed)
Immediate Anesthesia Transfer of Care Note  Patient: Kathleen Reynolds  Procedure(s) Performed:  BREAST BIOPSY WITH NEEDLE LOCALIZATION  Patient Location: PACU  Anesthesia Type: General  Level of Consciousness: awake, alert , oriented and patient cooperative  Airway & Oxygen Therapy: Patient Spontanous Breathing and Patient connected to face mask oxygen  Post-op Assessment: Report given to PACU RN, Post -op Vital signs reviewed and stable, Patient moving all extremities and Patient moving all extremities X 4  Post vital signs: Reviewed and stable Filed Vitals:   02/13/11 0903  BP: 130/85  Pulse: 70  Temp: 36.9 C  Resp: 20    Complications: No apparent anesthesia complications

## 2011-02-13 NOTE — Interval H&P Note (Signed)
History and Physical Interval Note:  02/13/2011 9:22 AM  Kathleen Reynolds  has presented today for surgery, with the diagnosis of right breast mass  The goals of treatment and the various methods of treatment have been discussed with the patient and family. After consideration of risks, benefits and other options for treatment, the patient has consented to  Procedure(s): RIGHT BREAST BIOPSY WITH NEEDLE LOCALIZATION as a surgical intervention .  The patients' history has been reviewed today, the patient examined today, there is no change in status, she is stable for surgery.  I have reviewed the patients' chart and labs.  Questions were answered to the patient's satisfaction.     Ernestene Mention 02/13/2011 9:27 AM

## 2011-02-16 ENCOUNTER — Encounter (HOSPITAL_BASED_OUTPATIENT_CLINIC_OR_DEPARTMENT_OTHER): Payer: Self-pay | Admitting: General Surgery

## 2011-02-17 ENCOUNTER — Telehealth (INDEPENDENT_AMBULATORY_CARE_PROVIDER_SITE_OTHER): Payer: Self-pay | Admitting: General Surgery

## 2011-02-17 ENCOUNTER — Telehealth (INDEPENDENT_AMBULATORY_CARE_PROVIDER_SITE_OTHER): Payer: Self-pay

## 2011-02-17 NOTE — Telephone Encounter (Signed)
I called home to discuss pathology. Patient not home. Left message with family member.  Called and left message on her cell phone.  Angelia Mould. Derrell Lolling, M.D., [facility] General and Minimally invasive Surgery Breast and Colorectal Surgery Office:   [phone] Pager:   [phone] 02/17/2011 9:48 AM

## 2011-02-17 NOTE — Telephone Encounter (Signed)
I spoke with pt and gave pt appt via phone for 02-18-11 per Dr Aura Camps request.

## 2011-02-17 NOTE — Telephone Encounter (Signed)
I spoke with patient when she called me back. I discussed Pathology report with her. Will try to move appt. Up.  Kathleen Reynolds. Derrell Lolling, M.D., [facility] General and Minimally invasive Surgery Breast and Colorectal Surgery Office:   [phone] Pager:   [phone] 02/17/2011 9:51 AM

## 2011-02-18 ENCOUNTER — Ambulatory Visit (INDEPENDENT_AMBULATORY_CARE_PROVIDER_SITE_OTHER): Payer: BC Managed Care – PPO | Admitting: General Surgery

## 2011-02-18 ENCOUNTER — Encounter (INDEPENDENT_AMBULATORY_CARE_PROVIDER_SITE_OTHER): Payer: Self-pay | Admitting: General Surgery

## 2011-02-18 ENCOUNTER — Other Ambulatory Visit (INDEPENDENT_AMBULATORY_CARE_PROVIDER_SITE_OTHER): Payer: Self-pay

## 2011-02-18 VITALS — BP 130/78 | HR 80 | Temp 98.3°F | Resp 18 | Ht 65.0 in | Wt 284.0 lb

## 2011-02-18 DIAGNOSIS — C50919 Malignant neoplasm of unspecified site of unspecified female breast: Secondary | ICD-10-CM

## 2011-02-18 DIAGNOSIS — C50911 Malignant neoplasm of unspecified site of right female breast: Secondary | ICD-10-CM

## 2011-02-18 NOTE — Progress Notes (Addendum)
Subjective:     Patient ID: Kathleen Reynolds, female   DOB: 04-27-1960, 51 y.o.   MRN: [id number]  HPI Kathleen Reynolds underwent right partial mastectomy with needle localization last week. There was a small density in the 12:00 position of the right breast. The breast is very large. I was unsure of the location of the density on the lumpectomy specimen and so I took extra margins. It turns out that this is an invasive ductal carcinoma, 9 mm in diameter, breast diagnostic profile pending. The deep margin was very close but I had taken an additional deep margin which was completely negative so  we had good margins.  ADDENDUM(07-03-2011):   BRCA1 positive  I have discussed this with her by phone and I have her in the office today to discuss this with her, her husband, and her son.  We had a lengthy discussion about options for therapy. She is aware that lumpectomy, radiation therapy and sentinel node biopsy and mastectomy and sentinel node are both standards of care. We talked about radiation therapy in the setting of lumpectomy. I told her it was unclear whether she would need chemotherapy, but that Dr. Mariel Sleet would discuss that with her after we do her node biopsy. I have encouraged her to make an appointment with Dr. much and she is going to do that..  At the end of the conversation it was clear that she would like to keep her breast if possible. Told that we would need to do an MRI of the breast to make sure that she did not have a synchronous lesion elsewhere. She was to go ahead with the MRI and surgical planning.  Review of Systems 10 system review of systems is performed and is negative except as described above.    Objective:   Physical Exam Constitutional: the patient is in no distress. She is morbidly obese. Family is with her.  Breast: the right breast is examined. The breast is very large. The incision at the 12:00 position is healing uneventfully. No infection. No  hematoma.  Nodes: No axillary adenopathy on the right palpated.   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  ADDENDUM(07-03-2011):   BRCA1 positive.  Status post left modified radical mastectomy, 1999, pathologic details pending  Morbid obesity  Hyperlipidemia  Patient desires breast conservation on the right if possible    Plan:     I had a 30 minute discussion with the patient and her family today.  We will schedule for bilateral breast MRI. Once the MRIs is done I will call the patient with the results. If this turns out to be a solitary lesion we will simply go ahead and schedule her for a right axillary sentinel lymph node biopsy.  If the MRI shows enhancement elsewhere, then she will need a second biopsy.   She will call Dr. Glenford Peers to set up an appointment with him.  I discussed the indication and details of SLN biopsy with her. Risks and complications have been outlined, including but not limited to bleeding, infection, nerve damage with chronic pain or numbness, arm swelling, further surgery she has multiple positive nodes. She understands these issues. Her questions are answered. She agrees with this plan.     Angelia Mould. Derrell Lolling, M.D., [facility] General and Minimally invasive Surgery Breast and Colorectal Surgery Office:   [phone] Pager:   [phone]

## 2011-02-18 NOTE — Patient Instructions (Signed)
Your recent right breast lumpectomy reveals a 9 mm invasive ductal carcinoma. We talked about options for treatment today. You have stated that you would like to keep your breast if possible, and I hope that that will be possible.  We will schedule you for an MRI of the right breast and I will call the  results to you. If there is no other abnormality in the right breast we will proceed with scheduling of a right axillary sentinel lymph node biopsy. If we see something else in the breast and we may have to do further biopsies.  You are  going to call Dr. Mariel Sleet to set up an appointment with him.

## 2011-02-23 ENCOUNTER — Encounter (HOSPITAL_COMMUNITY): Payer: Medicaid Other | Attending: Oncology | Admitting: Oncology

## 2011-02-23 DIAGNOSIS — Z853 Personal history of malignant neoplasm of breast: Secondary | ICD-10-CM

## 2011-02-23 DIAGNOSIS — C50419 Malignant neoplasm of upper-outer quadrant of unspecified female breast: Secondary | ICD-10-CM

## 2011-02-23 NOTE — Progress Notes (Signed)
CC:   Kathleen Reynolds. Lodema Hong, M.D. Angelia Mould. Derrell Lolling, M.D. Maxie Better, M.D.  DIAGNOSES: 1. New onset of right-sided breast cancer triple negative, 9 mm,     negative margins at the time of lumpectomy.  MRI of the right     breast is pending.  Sentinel lymph node biopsy is pending. 2. 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  No LVI was seen but she had high mitotic     index and high Ki-67 marker 50% and I treated her with AC times 4     cycles followed by Taxol times 4 cycles, completed as of     06/05/1998, thus far without recurrence. 3. Obesity, weighing 290 pounds on a 5 foot 5 inch frame. 4. Iron deficiency anemia in the past but with good oral absorption     when she was given adequate therapy. 5. Spontaneous miscarriage in 2005. 6. Dilatation and curettage in October of 2008. 7. Onset of hot flashes and cessation of menses in December of 2010,     followed by Dr. Maxie Better.  Sopheap of course comes from a family of BRCA1 positive people but she herself has never been tested probably due to financial reasons. She is not sure she could easily make the trip to [facility].  She is here today with a good friend and her significant other. She is still working but had to take off because of the lumpectomy. Her tumor is only 9 mm in size so chemotherapy is going to be of limited value and benefit but still she is one of the few people that might possibly benefit from chemotherapy since she has a triple negative cancer.  She would also potentially benefit from gene testing because I suspect she has the gene and yet her 3 sisters are still without any evidence of disease of the breast or ovary.  Her mother was 22 and in good health when I first met Adisson.  I am not sure  she is still living.  It was her maternal grandmother who had both breast cancer and colon cancer and died at age 39.  Her father died of lung cancer.  She still works for [employer] as a Lawyer.  So she is going to have an MRI of the breast this Thursday.  She is going to have a sentinel node biopsy after this, and I think we have to definitively find out whether she has any involvement.  If she has any involvement I think she needs chemotherapy of course.  She knows that she still could have radiation therapy and still could get potentially a breast cancer in another part of the breast even if she keeps her breast.  So she will think about things but I think she wants to keep her breast if possible.  We will see her back one way or the other in 2 weeks to follow up on all the things that transpire between now and then.    ______________________________ Ladona Horns. Mariel Sleet, MD ESN/MEDQ  D:  02/23/2011  T:  02/23/2011  Job:  [id number]

## 2011-02-23 NOTE — Patient Instructions (Signed)
Kathleen Reynolds  [id number] 1960-07-30  [facility]  Discharge Instructions  RECOMMENDATIONS MADE BY THE CONSULTANT AND ANY TEST RESULTS WILL BE SENT TO YOUR REFERRING DOCTOR.   EXAM FINDINGS BY MD TODAY AND SIGNS AND SYMPTOMS TO REPORT TO CLINIC OR PRIMARY MD: We will get you scheduled to be seen by one of the radiation oncologist in [place] as soon as we can.  Want you to proceed with the MRI and sentinel node biopsy.  If you want to do the genetic testing, let us know.  Your prognosis is good because of the size of your tumor.  MEDICATIONS PRESCRIBED: none       SPECIAL INSTRUCTIONS/FOLLOW-UP:  You have an appointment to be seen by one of there radiation oncologist at [facility] in [place] on 1/28 at 8:15am Return to Clinic in 2 1/2 weeks.   I acknowledge that I have been informed and understand all the instructions given to me and received a copy. I do not have any more questions at this time, but understand that I may call the Specialty Clinic at [facility] at [phone] during business hours should I have any further questions or need assistance in obtaining follow-up care.    __________________________________________  _____________  __________ Signature of Patient or Authorized Representative            Date                   Time    __________________________________________ Nurse's Signature

## 2011-02-23 NOTE — Progress Notes (Signed)
This office note has been dictated.

## 2011-02-24 ENCOUNTER — Encounter (INDEPENDENT_AMBULATORY_CARE_PROVIDER_SITE_OTHER): Payer: BC Managed Care – PPO | Admitting: General Surgery

## 2011-02-26 ENCOUNTER — Ambulatory Visit
Admission: RE | Admit: 2011-02-26 | Discharge: 2011-02-26 | Disposition: A | Discharge status: Home / Self Care | Payer: BC Managed Care – PPO | Source: Ambulatory Visit | Attending: General Surgery | Admitting: General Surgery

## 2011-02-26 DIAGNOSIS — C50911 Malignant neoplasm of unspecified site of right female breast: Secondary | ICD-10-CM

## 2011-02-26 MED ORDER — GADOBENATE DIMEGLUMINE 529 MG/ML IV SOLN
20.0000 mL | Freq: Once | INTRAVENOUS | Status: AC | PRN
Start: 2011-02-26 — End: 2011-02-26
  Administered 2011-02-26: 20 mL via INTRAVENOUS

## 2011-03-02 ENCOUNTER — Other Ambulatory Visit (INDEPENDENT_AMBULATORY_CARE_PROVIDER_SITE_OTHER): Payer: Self-pay

## 2011-03-02 ENCOUNTER — Other Ambulatory Visit (INDEPENDENT_AMBULATORY_CARE_PROVIDER_SITE_OTHER): Payer: Self-pay | Admitting: General Surgery

## 2011-03-02 ENCOUNTER — Telehealth (INDEPENDENT_AMBULATORY_CARE_PROVIDER_SITE_OTHER): Payer: Self-pay

## 2011-03-02 DIAGNOSIS — C50911 Malignant neoplasm of unspecified site of right female breast: Secondary | ICD-10-CM

## 2011-03-02 NOTE — Telephone Encounter (Signed)
Message copied by Joanette Gula on Mon Mar 02, 2011  3:57 PM ------      Message from: Ernestene Mention      Created: Mon Mar 02, 2011  3:26 PM       Orders have been put in, including SLN/Nuc. med and Dx associated.                  ----- Message -----         From: Joanette Gula, LPN         Sent: 03/02/2011  10:37 AM           To: Ernestene Mention, MD            Pt advised of mri result. Please place orders in epic for rt axil. Node bx on above. Need surgery orders put in and also order for ##nuc med##. I have written order sheet but schedulers cannot work on this until orders in epic. Thanks [employer].

## 2011-03-02 NOTE — Telephone Encounter (Signed)
Pt notified of mri result and that orders for surgery have been requested to Dr Derrell Lolling and will be sent to surgery schedulers today. Pt should expect call from surgery schedulers.

## 2011-03-03 ENCOUNTER — Ambulatory Visit (HOSPITAL_COMMUNITY): Payer: BC Managed Care – PPO | Admitting: Oncology

## 2011-03-04 ENCOUNTER — Ambulatory Visit (HOSPITAL_COMMUNITY): Payer: BC Managed Care – PPO | Admitting: Oncology

## 2011-03-06 ENCOUNTER — Encounter (HOSPITAL_COMMUNITY): Payer: Self-pay | Admitting: Pharmacy Technician

## 2011-03-06 HISTORY — PX: BREAST SURGERY: SHX581

## 2011-03-13 ENCOUNTER — Encounter (HOSPITAL_COMMUNITY)
Admission: RE | Admit: 2011-03-13 | Discharge: 2011-03-13 | Disposition: A | Discharge status: Home / Self Care | Payer: BC Managed Care – PPO | Source: Ambulatory Visit | Attending: General Surgery | Admitting: General Surgery

## 2011-03-13 ENCOUNTER — Encounter (HOSPITAL_COMMUNITY): Payer: Self-pay

## 2011-03-13 HISTORY — DX: Morbid (severe) obesity due to excess calories: E66.01

## 2011-03-13 HISTORY — DX: Unspecified rotator cuff tear or rupture of left shoulder, not specified as traumatic: M75.102

## 2011-03-13 LAB — CBC
HCT: 38.9 % (ref 36.0–46.0)
Hemoglobin: 13.3 g/dL (ref 12.0–15.0)
MCHC: 34.2 g/dL (ref 30.0–36.0)

## 2011-03-13 LAB — SURGICAL PCR SCREEN: Staphylococcus aureus: POSITIVE — AB

## 2011-03-13 NOTE — Pre-Procedure Instructions (Signed)
20 Kathleen Reynolds  03/13/2011   Your procedure is scheduled on:  Tuesday, Feb 12  Report to [facility] at 1000 AM.  Call this number if you have problems the morning of surgery: [phone]   Remember:   Do not eat food:After Midnight.  May have clear liquids: up to 4 Hours before arrival.  Clear liquids include soda, tea, black coffee, apple or grape juice, broth.  Take these medicines the morning of surgery with A SIP OF WATER: none   Do not wear jewelry, make-up or nail polish.  Do not wear lotions, powders, or perfumes. You may wear deodorant.  Do not shave 48 hours prior to surgery.  Do not bring valuables to the hospital.  Contacts, dentures or bridgework may not be worn into surgery.  Leave suitcase in the car. After surgery it may be brought to your room.  For patients admitted to the hospital, checkout time is 11:00 AM the day of discharge.   Patients discharged the day of surgery will not be allowed to drive home.  Name and phone number of your driver: **[name], sister*  Special Instructions: CHG Shower Use Special Wash: 1/2 bottle night before surgery and 1/2 bottle morning of surgery.   Please read over the following fact sheets that you were given: Pain Booklet, Coughing and Deep Breathing, MRSA Information and Surgical Site Infection Prevention

## 2011-03-16 ENCOUNTER — Encounter (HOSPITAL_COMMUNITY): Payer: Medicaid Other | Attending: Oncology | Admitting: Oncology

## 2011-03-16 VITALS — BP 133/82 | HR 68 | Temp 97.8°F | Wt 290.0 lb

## 2011-03-16 DIAGNOSIS — C50911 Malignant neoplasm of unspecified site of right female breast: Secondary | ICD-10-CM

## 2011-03-16 DIAGNOSIS — C50419 Malignant neoplasm of upper-outer quadrant of unspecified female breast: Secondary | ICD-10-CM

## 2011-03-16 DIAGNOSIS — C50919 Malignant neoplasm of unspecified site of unspecified female breast: Secondary | ICD-10-CM | POA: Insufficient documentation

## 2011-03-16 DIAGNOSIS — D509 Iron deficiency anemia, unspecified: Secondary | ICD-10-CM | POA: Insufficient documentation

## 2011-03-16 MED ORDER — CEFAZOLIN SODIUM-DEXTROSE 2-3 GM-% IV SOLR
2.0000 g | INTRAVENOUS | Status: AC
  Administered 2011-03-17: 2 g via INTRAVENOUS
  Filled 2011-03-16: qty 50

## 2011-03-16 MED ORDER — HEPARIN SODIUM (PORCINE) 5000 UNIT/ML IJ SOLN
5000.0000 [IU] | Freq: Once | INTRAMUSCULAR | Status: AC
  Administered 2011-03-17: 5000 [IU] via SUBCUTANEOUS
  Filled 2011-03-16: qty 1

## 2011-03-16 MED ORDER — CHLORHEXIDINE GLUCONATE 4 % EX LIQD
1.0000 "application " | Freq: Once | CUTANEOUS | Status: DC
  Filled 2011-03-16: qty 15

## 2011-03-16 NOTE — Patient Instructions (Signed)
Kathleen Reynolds  [id number] [birth year]  [facility]  Discharge Instructions  RECOMMENDATIONS MADE BY THE CONSULTANT AND ANY TEST RESULTS WILL BE SENT TO YOUR REFERRING DOCTOR.   EXAM FINDINGS BY MD TODAY AND SIGNS AND SYMPTOMS TO REPORT TO CLINIC OR PRIMARY MD: Will wait until after surgery to discuss treatment options in depth.  MEDICATIONS PRESCRIBED: none     SPECIAL INSTRUCTIONS/FOLLOW-UP: Return to Clinic 1 week after surgery.   I acknowledge that I have been informed and understand all the instructions given to me and received a copy. I do not have any more questions at this time, but understand that I may call the Specialty Clinic at [facility] at [phone] during business hours should I have any further questions or need assistance in obtaining follow-up care.    __________________________________________  _____________  __________ Signature of Patient or Authorized Representative            Date                   Time    __________________________________________ Nurse's Signature

## 2011-03-16 NOTE — Progress Notes (Signed)
This office note has been dictated.

## 2011-03-16 NOTE — H&P (Signed)
Kathleen Reynolds    MRN: [id number]   Description: 51 year old female  Provider: Ernestene Mention, MD  Department: [facility]     Diagnoses     Breast cancer, right breast   - Primary    174.9      Reason for Visit     Routine Post Op    new diagnosis, IDC right breast;  12 oclock; T1c, NX;   ER/PR pending        Vitals - Last Recorded       BP Pulse Temp Resp Ht Wt    130/78  80  98.3 F (36.8 C)  18  5\' 5"  (1.651 m)  284 lb (128.822 kg)          BMI  - 47.26 kg/m2  10/17/2009               History and Physical   Ernestene Mention, MD:      Patient ID: Kathleen Reynolds, female   DOB: May 11, 1960, 51 y.o.   MRN: [id number]   HPI Kathleen Reynolds underwent right partial mastectomy with needle localization last week. There was a small density in the 12:00 position of the right breast. The breast is very large. I was unsure of the location of the density on the lumpectomy specimen and so I took extra margins. It turns out that this is an invasive ductal carcinoma, 9 mm in diameter, breast diagnostic profile pending. The deep margin was very close but I had taken an additional deep margin which was completely negative so  we had good margins.  I have discussed this with her by phone and I have her in the office today to discuss this with her, her husband, and her son.  We had a lengthy discussion about options for therapy. She is aware that lumpectomy, radiation therapy and sentinel node biopsy and mastectomy and sentinel node are both standards of care. We talked about radiation therapy in the setting of lumpectomy. I told her it was unclear whether she would need chemotherapy, but that Dr. Mariel Sleet would discuss that with her after we do her node biopsy. I have encouraged her to make an appointment with Dr. much and she is going to do that..  At the end of the conversation it was clear that she would like to keep her breast if possible. Told that we would need to do  an MRI of the breast to make sure that she did not have a synchronous lesion elsewhere. She was to go ahead with the MRI and surgical planning.    Past Medical History  Diagnosis Date  . Migraines   . Breast mass in female     right  . Arthritis   . Hyperlipidemia   . Breast cancer     Right  . Left rotator cuff tear   . Morbid obesity   . Anemia LIFELONG    HEAVY MENSES MAR 2012 HB 13 MCV 86.7 FERRITIN  66   No medication comments found. Allergies  Allergen Reactions  . Ibuprofen Itching   History   Social History  . Marital Status: Single    Spouse Name: N/A    Number of Children: N/A  . Years of Education: N/A   Occupational History  . Not on file.   Social History Main Topics  . Smoking status: Never Smoker   . Smokeless tobacco: Never Used  . Alcohol Use: 1.0 oz/week    2 drink(s) per week  occasional  . Drug Use: No  . Sexually Active: Yes    Birth Control/ Protection: None   Other Topics Concern  . 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 Hypertension Mother   . Cancer Father     lung cancer  . Diabetes Sister   . Cancer Brother     colon cancer  . Heart disease Sister     heart attack    Review of Systems 10 system review of systems is performed and is negative except as described above.   Objective:    Physical Exam Constitutional: the patient is in no distress. She is morbidly obese. Family is with her.  Lungs:  Clear to ausscultation bilaterally Heart:  Regular rhythm, no ectopy. Breast: the right breast is examined. The breast is very large. The incision at the 12:00 position is healing uneventfully. No infection. No hematoma. Nodes: No axillary adenopathy on the right palpated.   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  Status post left modified radical mastectomy, 1999, pathologic details pending. See Dr. Thornton Papas notes.  Morbid obesity  Hyperlipidemia  Patient desires breast conservation on the right if possible   Plan:      I had a 30 minute discussion with the patient and her family today.  We will schedule for bilateral breast MRI. Once the MRIs is done I will call the patient with the results. If this turns out to be a solitary lesion we will simply go ahead and schedule her for a right axillary sentinel lymph node biopsy.  If the MRI shows enhancement elsewhere, then she will need a second biopsy.   She will call Dr. Glenford Peers to set up an appointment with him.  I discussed the indication and details of SLN biopsy with her. Risks and complications have been outlined, including but not limited to bleeding, infection, nerve damage with chronic pain or numbness, arm swelling, further surgery she has multiple positive nodes. She understands these issues. Her questions are answered. She agrees with this plan.       Angelia Mould. Derrell Lolling, M.D., [facility] General and Minimally invasive Surgery Breast and Colorectal Surgery Office:   [phone] Pager:   [phone] 03/16/2011

## 2011-03-16 NOTE — Progress Notes (Signed)
CC:   Kathleen Reynolds. Lodema Hong, M.D.  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 Claud Kelp on 02/13/2011.  As mentioned above this showed a grade 3 invasive ductal carcinoma spanning 9 mm.  LVI was not seen however.  It was less than 1/10 of a centimeter from the posterior margin.  It was still ER negative/PR negative.  Ki-67 marker was 54%.  HER2 was not amplified. She is scheduled for a lymph node biopsy via sentinel node localization by Dr. Derrell Lolling tomorrow.  She is here today; I brought her back today that is because I wanted to make sure we are still on schedule.  She has this 9 mm high-grade triple negative breast cancer which is still very concerning.  Sentinel nodes will be vital but at the same time even if sentinel nodes are negative I think it might well benefit her to take chemotherapy at her young age. I do believe she is hurt and she is BRCA1 or BRCA2 positive since she comes from a family of BRAC1 positive patients, but she herself has never been tested.  Her demeanor was slightly depressed today.  She was not sure she wants to do much more.  So I want to see her next week after the sentinel nodes are out and I will talk to Dr. Derrell Lolling if I need to about a port.  I think if she had a well-differentiated tumor that was 9 mm I do not think I would even be considering it but I think with this high-grade triple negative breast cancer 6-8 cycles of chemotherapy either with FEC or just carboplatin and Taxotere would be very worth considering.    ______________________________ Kathleen Reynolds. Kathleen Sleet, MD ESN/MEDQ  D:  03/16/2011  T:  03/16/2011  Job:  [id number]

## 2011-03-17 ENCOUNTER — Encounter (HOSPITAL_COMMUNITY): Payer: Self-pay | Admitting: Anesthesiology

## 2011-03-17 ENCOUNTER — Encounter (HOSPITAL_COMMUNITY): Payer: Self-pay | Admitting: *Deleted

## 2011-03-17 ENCOUNTER — Telehealth (INDEPENDENT_AMBULATORY_CARE_PROVIDER_SITE_OTHER): Payer: Self-pay

## 2011-03-17 ENCOUNTER — Encounter (HOSPITAL_COMMUNITY)
Admission: RE | Disposition: A | Discharge status: Home / Self Care | Payer: Self-pay | Source: Ambulatory Visit | Attending: General Surgery

## 2011-03-17 ENCOUNTER — Ambulatory Visit (HOSPITAL_COMMUNITY)
Admission: RE | Admit: 2011-03-17 | Discharge: 2011-03-17 | Disposition: A | Discharge status: Home / Self Care | Payer: BC Managed Care – PPO | Source: Ambulatory Visit | Attending: General Surgery | Admitting: General Surgery

## 2011-03-17 ENCOUNTER — Ambulatory Visit (HOSPITAL_COMMUNITY): Payer: BC Managed Care – PPO | Admitting: Anesthesiology

## 2011-03-17 ENCOUNTER — Other Ambulatory Visit (INDEPENDENT_AMBULATORY_CARE_PROVIDER_SITE_OTHER): Payer: Self-pay | Admitting: General Surgery

## 2011-03-17 DIAGNOSIS — C50919 Malignant neoplasm of unspecified site of unspecified female breast: Secondary | ICD-10-CM

## 2011-03-17 DIAGNOSIS — C50911 Malignant neoplasm of unspecified site of right female breast: Secondary | ICD-10-CM

## 2011-03-17 DIAGNOSIS — Z01812 Encounter for preprocedural laboratory examination: Secondary | ICD-10-CM | POA: Insufficient documentation

## 2011-03-17 DIAGNOSIS — C50419 Malignant neoplasm of upper-outer quadrant of unspecified female breast: Secondary | ICD-10-CM | POA: Insufficient documentation

## 2011-03-17 SURGERY — BIOPSY, LYMPH NODE, SENTINEL, AXILLARY
Anesthesia: General | Site: Breast | Laterality: Right | Wound class: Clean

## 2011-03-17 MED ORDER — ONDANSETRON HCL 4 MG/2ML IJ SOLN
INTRAMUSCULAR | Status: DC | PRN
  Administered 2011-03-17: 4 mg via INTRAVENOUS

## 2011-03-17 MED ORDER — MIDAZOLAM HCL 2 MG/2ML IJ SOLN
INTRAMUSCULAR | Status: AC
  Filled 2011-03-17: qty 2

## 2011-03-17 MED ORDER — FENTANYL CITRATE 0.05 MG/ML IJ SOLN
INTRAMUSCULAR | Status: DC | PRN
  Administered 2011-03-17 (×3): 25 ug via INTRAVENOUS

## 2011-03-17 MED ORDER — MIDAZOLAM HCL 5 MG/5ML IJ SOLN
INTRAMUSCULAR | Status: DC | PRN
  Administered 2011-03-17 (×2): 1 mg via INTRAVENOUS

## 2011-03-17 MED ORDER — PROPOFOL 10 MG/ML IV EMUL
INTRAVENOUS | Status: DC | PRN
  Administered 2011-03-17: 200 mg via INTRAVENOUS

## 2011-03-17 MED ORDER — HYDROCODONE-ACETAMINOPHEN 5-325 MG PO TABS: 1.0000 | ORAL_TABLET | ORAL | Status: AC | PRN

## 2011-03-17 MED ORDER — LACTATED RINGERS IV SOLN
INTRAVENOUS | Status: DC
  Administered 2011-03-17: 12:00:00 via INTRAVENOUS

## 2011-03-17 MED ORDER — BUPIVACAINE-EPINEPHRINE PF 0.5-1:200000 % IJ SOLN
INTRAMUSCULAR | Status: DC | PRN
  Administered 2011-03-17: 10 mL

## 2011-03-17 MED ORDER — FENTANYL CITRATE 0.05 MG/ML IJ SOLN
INTRAMUSCULAR | Status: AC
  Filled 2011-03-17: qty 2

## 2011-03-17 MED ORDER — TECHNETIUM TC 99M SULFUR COLLOID FILTERED
1.0000 | Freq: Once | INTRAVENOUS | Status: AC | PRN
  Administered 2011-03-17: 1 via INTRADERMAL

## 2011-03-17 MED ORDER — PROMETHAZINE HCL 25 MG/ML IJ SOLN: 6.2500 mg | INTRAMUSCULAR | Status: DC | PRN

## 2011-03-17 MED ORDER — ACETAMINOPHEN 10 MG/ML IV SOLN
1000.0000 mg | Freq: Once | INTRAVENOUS | Status: AC
  Administered 2011-03-17: 1000 mg via INTRAVENOUS
  Filled 2011-03-17: qty 100

## 2011-03-17 MED ORDER — FENTANYL CITRATE 0.05 MG/ML IJ SOLN
100.0000 ug | INTRAMUSCULAR | Status: DC | PRN
  Administered 2011-03-17: 100 ug via INTRAVENOUS

## 2011-03-17 MED ORDER — ACETAMINOPHEN 10 MG/ML IV SOLN
INTRAVENOUS | Status: AC
  Filled 2011-03-17: qty 100

## 2011-03-17 MED ORDER — MIDAZOLAM HCL 2 MG/2ML IJ SOLN
2.0000 mg | INTRAMUSCULAR | Status: DC | PRN
  Administered 2011-03-17: 2 mg via INTRAVENOUS

## 2011-03-17 MED ORDER — SODIUM CHLORIDE 0.9 % IJ SOLN
INTRAMUSCULAR | Status: DC | PRN
  Administered 2011-03-17: 13:00:00

## 2011-03-17 MED ORDER — MEPERIDINE HCL 25 MG/ML IJ SOLN
6.2500 mg | INTRAMUSCULAR | Status: DC | PRN
Start: 2011-03-17 — End: 2011-03-17

## 2011-03-17 MED ORDER — LACTATED RINGERS IV SOLN
INTRAVENOUS | Status: DC | PRN
  Administered 2011-03-17: 12:00:00 via INTRAVENOUS

## 2011-03-17 MED ORDER — HYDROMORPHONE HCL PF 1 MG/ML IJ SOLN: 0.2500 mg | INTRAMUSCULAR | Status: DC | PRN

## 2011-03-17 SURGICAL SUPPLY — 42 items
ADH SKN CLS APL DERMABOND .7 (GAUZE/BANDAGES/DRESSINGS)
APPLIER CLIP 9.375 MED OPEN (MISCELLANEOUS)
APR CLP MED 9.3 20 MLT OPN (MISCELLANEOUS)
CHLORAPREP W/TINT 26ML (MISCELLANEOUS) ×2 IMPLANT
CLIP APPLIE 9.375 MED OPEN (MISCELLANEOUS) IMPLANT
CLOTH BEACON ORANGE TIMEOUT ST (SAFETY) ×2 IMPLANT
CONT SPEC 4OZ CLIKSEAL STRL BL (MISCELLANEOUS) ×3 IMPLANT
COVER PROBE W GEL 5X96 (DRAPES) ×2 IMPLANT
COVER SURGICAL LIGHT HANDLE (MISCELLANEOUS) ×2 IMPLANT
DERMABOND ADVANCED (GAUZE/BANDAGES/DRESSINGS)
DERMABOND ADVANCED .7 DNX12 (GAUZE/BANDAGES/DRESSINGS) ×1 IMPLANT
DRAPE CHEST BREAST 15X10 FENES (DRAPES) ×2 IMPLANT
ELECT BLADE 4.0 EZ CLEAN MEGAD (MISCELLANEOUS)
ELECT CAUTERY BLADE 6.4 (BLADE) ×2 IMPLANT
ELECT REM PT RETURN 9FT ADLT (ELECTROSURGICAL) ×2
ELECTRODE BLDE 4.0 EZ CLN MEGD (MISCELLANEOUS) IMPLANT
ELECTRODE REM PT RTRN 9FT ADLT (ELECTROSURGICAL) ×1 IMPLANT
GLOVE BIO SURGEON STRL SZ8 (GLOVE) ×1 IMPLANT
GLOVE BIOGEL PI IND STRL 6.5 (GLOVE) IMPLANT
GLOVE BIOGEL PI IND STRL 7.5 (GLOVE) IMPLANT
GLOVE BIOGEL PI INDICATOR 6.5 (GLOVE) ×1
GLOVE BIOGEL PI INDICATOR 7.5 (GLOVE) ×1
GLOVE EUDERMIC 7 POWDERFREE (GLOVE) ×2 IMPLANT
GLOVE SURG SS PI 6.5 STRL IVOR (GLOVE) ×1 IMPLANT
GLOVE SURG SS PI 7.0 STRL IVOR (GLOVE) ×2 IMPLANT
GOWN PREVENTION PLUS XLARGE (GOWN DISPOSABLE) ×2 IMPLANT
GOWN STRL NON-REIN LRG LVL3 (GOWN DISPOSABLE) ×2 IMPLANT
KIT BASIN OR (CUSTOM PROCEDURE TRAY) ×2 IMPLANT
KIT ROOM TURNOVER OR (KITS) ×2 IMPLANT
NDL 18GX1X1/2 (RX/OR ONLY) (NEEDLE) ×1 IMPLANT
NDL HYPO 25GX1X1/2 BEV (NEEDLE) ×2 IMPLANT
NEEDLE 18GX1X1/2 (RX/OR ONLY) (NEEDLE) ×2 IMPLANT
NEEDLE HYPO 25GX1X1/2 BEV (NEEDLE) ×4 IMPLANT
NS IRRIG 1000ML POUR BTL (IV SOLUTION) ×2 IMPLANT
PACK GENERAL/GYN (CUSTOM PROCEDURE TRAY) ×2 IMPLANT
PAD ARMBOARD 7.5X6 YLW CONV (MISCELLANEOUS) ×2 IMPLANT
STAPLER VISISTAT 35W (STAPLE) ×2 IMPLANT
SUT MNCRL AB 4-0 PS2 18 (SUTURE) ×2 IMPLANT
SUT VIC AB 3-0 SH 18 (SUTURE) ×2 IMPLANT
SYR CONTROL 10ML LL (SYRINGE) ×4 IMPLANT
TOWEL OR 17X24 6PK STRL BLUE (TOWEL DISPOSABLE) ×2 IMPLANT
TOWEL OR 17X26 10 PK STRL BLUE (TOWEL DISPOSABLE) ×2 IMPLANT

## 2011-03-17 NOTE — Anesthesia Preprocedure Evaluation (Signed)
Anesthesia Evaluation  Patient identified by MRN, date of birth, ID band Patient awake    Reviewed: Allergy & Precautions, H&P , NPO status , Patient's Chart, lab work & pertinent test results  Airway Mallampati: II TM Distance: >3 FB Neck ROM: Full    Dental No notable dental hx. (+) Teeth Intact   Pulmonary neg pulmonary ROS,  clear to auscultation  Pulmonary exam normal       Cardiovascular neg cardio ROS Regular Normal    Neuro/Psych  Headaches,  Neuromuscular disease Negative Psych ROS   GI/Hepatic negative GI ROS, Neg liver ROS,   Endo/Other  Morbid obesity  Renal/GU negative Renal ROS  Genitourinary negative   Musculoskeletal   Abdominal (+) obese,   Peds  Hematology negative hematology ROS (+)   Anesthesia Other Findings   Reproductive/Obstetrics negative OB ROS                           Anesthesia Physical Anesthesia Plan  ASA: III  Anesthesia Plan: General   Post-op Pain Management:    Induction: Intravenous  Airway Management Planned: LMA  Additional Equipment:   Intra-op Plan:   Post-operative Plan: Extubation in OR  Informed Consent: I have reviewed the patients History and Physical, chart, labs and discussed the procedure including the risks, benefits and alternatives for the proposed anesthesia with the patient or authorized representative who has indicated his/her understanding and acceptance.     Plan Discussed with: CRNA  Anesthesia Plan Comments:         Anesthesia Quick Evaluation

## 2011-03-17 NOTE — Transfer of Care (Deleted)
Immediate Anesthesia Transfer of Care Note  Patient: Kathleen Reynolds  Procedure(s) Performed: Procedure(s) (LRB): AXILLARY SENTINEL NODE BIOPSY (Right)  Patient Location: PACU  Anesthesia Type: General  Level of Consciousness: alert , oriented and patient cooperative  Airway & Oxygen Therapy: Patient Spontanous Breathing and Patient connected to nasal cannula oxygen  Post-op Assessment: Report given to PACU RN and Post -op Vital signs reviewed and stable  Post vital signs: Reviewed  Complications: No apparent anesthesia complications

## 2011-03-17 NOTE — Interval H&P Note (Signed)
History and Physical Interval Note:  03/17/2011 12:33 PM  Kathleen Reynolds  has presented today for surgery, with the diagnosis of right Breast cancer  The goals of treatment and the various methods of treatment have been discussed with the patient and family. After consideration of risks, benefits and other options for treatment, the patient has consented to  Procedure(s) (LRB): RIGHT AXILLARY SENTINEL NODE BIOPSY (Right) as a surgical intervention .  The patients' history has been reviewed, patient examined, no change in status, stable for surgery.  I have reviewed the patients' chart and labs.  Questions were answered to the patient's satisfaction.     Ernestene Mention 03/17/2011 12:34 PM

## 2011-03-17 NOTE — Anesthesia Postprocedure Evaluation (Signed)
  Anesthesia Post-op Note  Patient: Kathleen Reynolds  Procedure(s) Performed: Procedure(s) (LRB): AXILLARY SENTINEL NODE BIOPSY (Right)  Patient Location: PACU  Anesthesia Type: General  Level of Consciousness: awake  Airway and Oxygen Therapy: Patient Spontanous Breathing and Patient connected to nasal cannula oxygen  Post-op Pain: none  Post-op Assessment: Post-op Vital signs reviewed, Patient's Cardiovascular Status Stable, Respiratory Function Stable, Patent Airway and No signs of Nausea or vomiting  Post-op Vital Signs: Reviewed and stable  Complications: No apparent anesthesia complications

## 2011-03-17 NOTE — Telephone Encounter (Signed)
Nurse from [facility] called to request a verbal order for a bra before pt is discharged.  Pt had partial mastectomy w/out drains.  I spoke with Iona Hansen, LPN who said a sports bra would be fine.

## 2011-03-17 NOTE — Preoperative (Signed)
Beta Blockers   Reason not to administer Beta Blockers:Not Applicable 

## 2011-03-17 NOTE — Op Note (Signed)
Patient Name:           Kathleen Reynolds   Date of Surgery:        03/17/2011  Pre op Diagnosis:      Invasive ductal carcinoma right breast, upper outer quadrant, T1B tumor, triple negative tumor.  Post op Diagnosis:    same  Procedure:                 Right axillary sentinel lymph node biopsy  Surgeon:                     Angelia Mould. Derrell Lolling, M.D., FACS  Assistant:                      none  Operative Indications:   This is a 51 year old woman with a history of left-sided breast cancer, status post mastectomy and chemotherapy. She recently underwent needle localization and excisional mass of the mammographic distally in the superior right breast which turned out to be a small invasive carcinoma, triple negative tumor, a T1b lesion. She is returned to the operating room for right axillary sentinel node biopsy.  Operative Findings:       I found 2 sentinel lymph nodes, both very high and very blue.  Procedure in Detail:          The patient underwent an injection of radionuclide into the right breast in the holding area. She was taken to the operating room where general anesthesia was induced. Surgical time out was performed. Intravenous antibiotics were given. I injected 5 cc of blue dye into the right breast, subareolar area. This was 2 cc of methylene blue mixed with 3 cc of saline. The breast was massaged for about 5 minutes. The right breast and axilla were then prepped and draped in a sterile fashion. 0.5% Marcaine with epinephrine was used as a local infiltration anesthetic. Using the neoprobe as a guide I made a transverse incision in the right axilla at the hairline. Dissection was carried down through the very deep subcutaneous tissue. I incised the clavipectoral fascia and entered the level I axillary node area. I found 2 very hot, very blue lymph nodes and these were removed conservatively. There were no other lymph nodes that had any significant radioactivity or blue dye in them. We  sent 2 sentinel lymph nodes. The wound was irrigated with saline. Hemostasis was excellent. The axillary soft tissues were closed in 2 layers with interrupted sutures of 3-0 Vicryl and the skin was closed with a running subcuticular suture of 4-0 Monocryl and Dermabond. The patient was taken to recovery room in excellent condition. Complications none. Counts correct. EBL 10 cc.     Angelia Mould. Derrell Lolling, M.D., FACS General and Minimally Invasive Surgery Breast and Colorectal Surgery  03/17/2011 1:33 PM

## 2011-03-17 NOTE — Transfer of Care (Signed)
Immediate Anesthesia Transfer of Care Note  Patient: Kathleen Reynolds  Procedure(s) Performed: Procedure(s) (LRB): AXILLARY SENTINEL NODE BIOPSY (Right)  Patient Location: PACU  Anesthesia Type: General  Level of Consciousness: awake, oriented and patient cooperative  Airway & Oxygen Therapy: Patient Spontanous Breathing and Patient connected to nasal cannula oxygen  Post-op Assessment: Report given to PACU RN and Post -op Vital signs reviewed and stable  Post vital signs: Reviewed  Complications: No apparent anesthesia complications

## 2011-03-17 NOTE — Progress Notes (Signed)
Pt asked if  She could wear bra.  Dr. Derrell Lolling notified and orders received.  Pt notified to buy a sports bra.  Pt verbalized understanding.//lL. Calyn Rubi,RN

## 2011-03-19 ENCOUNTER — Telehealth (INDEPENDENT_AMBULATORY_CARE_PROVIDER_SITE_OTHER): Payer: Self-pay

## 2011-03-19 NOTE — Progress Notes (Signed)
Quick Note:  Inform patient of Pathology report,. Nodes negative. ______

## 2011-03-19 NOTE — Telephone Encounter (Signed)
Message copied by Joanette Gula on Thu Mar 19, 2011  9:20 AM ------      Message from: Kathleen Reynolds Mention      Created: Thu Mar 19, 2011  7:21 AM       Inform patient of Pathology report,. Nodes negative.

## 2011-03-19 NOTE — Telephone Encounter (Signed)
Pt notified of path result and po appt made.  

## 2011-03-25 ENCOUNTER — Other Ambulatory Visit (INDEPENDENT_AMBULATORY_CARE_PROVIDER_SITE_OTHER): Payer: Self-pay | Admitting: General Surgery

## 2011-03-25 ENCOUNTER — Encounter (HOSPITAL_BASED_OUTPATIENT_CLINIC_OR_DEPARTMENT_OTHER): Payer: Medicaid Other | Admitting: Oncology

## 2011-03-25 VITALS — BP 143/84 | HR 76 | Temp 97.4°F | Ht 65.0 in | Wt 290.0 lb

## 2011-03-25 DIAGNOSIS — C50911 Malignant neoplasm of unspecified site of right female breast: Secondary | ICD-10-CM

## 2011-03-25 DIAGNOSIS — Z148 Genetic carrier of other disease: Secondary | ICD-10-CM

## 2011-03-25 DIAGNOSIS — C50419 Malignant neoplasm of upper-outer quadrant of unspecified female breast: Secondary | ICD-10-CM

## 2011-03-25 NOTE — Progress Notes (Signed)
This office note has been dictated.

## 2011-03-25 NOTE — Patient Instructions (Signed)
Kathleen Reynolds  [id number] 1960/12/08   [facility]  Discharge Instructions  RECOMMENDATIONS MADE BY THE CONSULTANT AND ANY TEST RESULTS WILL BE SENT TO YOUR REFERRING DOCTOR.   EXAM FINDINGS BY MD TODAY AND SIGNS AND SYMPTOMS TO REPORT TO CLINIC OR PRIMARY MD: Will get you set up for genetic counseling, port placement and chemotherapy teaching.  Once we know when your port is to be placed we will get you scheduled for chemotherapy.   MEDICATIONS PRESCRIBED: none   INSTRUCTIONS GIVEN AND DISCUSSED:   SPECIAL INSTRUCTIONS/FOLLOW-UP: Return to Clinic on:  2/27 for chemotherapy teaching.  Dr. Jacinto Halim office will call you with the day and time for your port placement.   I acknowledge that I have been informed and understand all the instructions given to me and received a copy. I do not have any more questions at this time, but understand that I may call the Specialty Clinic at [facility] at [phone] during business hours should I have any further questions or need assistance in obtaining follow-up care.    __________________________________________  _____________  __________ Signature of Patient or Authorized Representative            Date                   Time    __________________________________________ Nurse's Signature

## 2011-03-25 NOTE — Progress Notes (Signed)
CC:   Milus Mallick. Lodema Hong, M.D. Angelia Mould. Derrell Lolling, M.D. Radene Gunning, M.D., Ph.D. Maxie Better, M.D.  DIAGNOSIS: 1. Right-sided triple-negative breast cancer 9 mm in size, status post     wire localization and removal by Dr. Derrell Lolling on 02/13/2011, now     followed by sentinel node biopsy, which was negative, done just the     other day.  Her cancer was ER negative, PR negative, Ki-67 marker     high at 54%, HER2/neu was negative. 2. History of left-sided breast cancer in October 1999, status post     lumpectomy initially, followed by mastectomy because of positive     margins.  This was a triple-negative cancer, as well.  Seven nodes     were negative.  It was grade 3, 2.3 cm in size, with a high mitotic     index, and I treated her with AC x 4 cycles, followed by Taxol x 4     cycles, completed as of 06/05/1998.  Ki-67 marker was 50% at that     time. 3. BRCA1 positivity in her family; her aunt, who is my patient, and     her cousin are both BRCA1 positive, and she has now agreed to be     seen by the genetics clinic for consultation, though she probably     will not be able to afford it; so hopefully, they will be able to     fund it. 4. Iron-deficiency anemia in the past. 5. History of cessation of menses in December 2010. 6. Spontaneous miscarriage in 2005. 7. D and C in October 2008. 8. Negative colonoscopy 2003. Kathleen Reynolds is here today, and it is of note that she had 2 negative sentinel nodes, so I think that is very encouraging, but I think she is still at high risk of relapse, and I have quoted her around a 20% relapse rate over the next 5-10 years without further intervention other than radiation.  Of course, she comes from a very strong BRCA1 family.  She has her son, Kelby Fam, with her and her significant other, and she, after discussion, has agreed to let us at least make an appointment with the genetics clinic for an initial consultation.  She is healing well,  but I do feel that she is a candidate for adjuvant chemotherapy with carboplatin and Taxotere for a minimum of 4 cycles and then 6 cycles if we can get her through it.  We may have trouble because of her prior chemotherapy, of course, but she was never treated with radiation, just the [facility] and the T.  So I think it is worth a try with chemotherapy again, since I think she is at higher than usual risk for a 9 mm tumor.  She is agreeable, as is her family, so we will get Dr. Derrell Lolling, whom I have spoken with today, to call her and get an appointment to set her up for the Port-A-Cath placement, and then we will pick a date for starting therapy.  It will be every 21 days for 6 cycles, ideally, 4 if that is all we can get into her.  She is to see Dr. Dorothy Puffer in Radiation this coming Monday.    ______________________________ Ladona Horns. Mariel Sleet, MD ESN/MEDQ  D:  03/25/2011  T:  03/25/2011  Job:  [id number]

## 2011-03-26 ENCOUNTER — Encounter (HOSPITAL_BASED_OUTPATIENT_CLINIC_OR_DEPARTMENT_OTHER): Payer: Self-pay | Admitting: *Deleted

## 2011-03-26 NOTE — Progress Notes (Signed)
Here 1/13 for rt lump-snbx No labs ordered

## 2011-03-26 NOTE — H&P (Signed)
    Kathleen Reynolds    MRN: [id number]   Description: 51 year old female  Provider: Ernestene Mention, MD  Department: [facility]        Diagnoses     Breast cancer, right breast   - Primary    174.9      Vitals       BP Pulse Temp Resp Ht Wt    130/78  80  98.3 F (36.8 C)  18  5\' 5"  (1.651 m)  284 lb (128.822 kg)     BMI -47.26 kg/m2  10/17/2009             History and Physical     Ernestene Mention, MD      Patient ID: Kathleen Reynolds, female   DOB: 1960-05-04, 51 y.o.   MRN: [id number]      HPI Ms. Knighton underwent right partial mastectomy with needle localization. There was a small density in the 12:00 position of the right breast. The breast is very large. I was unsure of the location of the density on the lumpectomy specimen and so I took extra margins. It turns out that this is an invasive ductal carcinoma, 9 mm in diameter, breast diagnostic profile pending. The deep margin was very close but I had taken an additional deep margin which was completely negative so  we had good margins.  Sentinal Node biopsy done on 03/17/2011 reveals negative nodes.  MRI shows no evidence for synchronous malignancy.  She has seen Dr. Mariel Sleet. He plans adjuvant chemotherapy with Carbo and taxotere. He requests a port a cath. She has had one before. We will schedule that.  Review of Systems 10 system review of systems is performed and is negative except as described above.      Physical Exam Constitutional: the patient is in no distress. She is morbidly obese. Family is with her.  Lungs: clear to auscultation bilaterally. Heart:  RRR without ectopy Breast: the right breast is examined. The breast is very large. The incision at the 12:00 position is healing uneventfully. No infection. No hematoma.  Nodes: No axillary adenopathy on the right palpated    Assessment:     Invasive ductal carcinoma right breast, 12:00 position, 9 mm triple negative tumor, T1b, N0  tumor.  Status post left modified radical mastectomy, 1999, pathologic details pending  Morbid obesity  Family history BRCS+ positive. Patient never tested.  Hyperlipidemia     Plan:    Schedule port a cath insertion 03/27/2011.Marland Kitchen        Angelia Mould. Derrell Lolling, M.D., [facility] General and Minimally invasive Surgery Breast and Colorectal Surgery Office:   [phone] Pager:   [phone] 03/26/2011 2:17 PM

## 2011-03-27 ENCOUNTER — Encounter (HOSPITAL_BASED_OUTPATIENT_CLINIC_OR_DEPARTMENT_OTHER)
Admission: RE | Disposition: A | Discharge status: Home / Self Care | Payer: Self-pay | Source: Ambulatory Visit | Attending: General Surgery

## 2011-03-27 ENCOUNTER — Encounter (HOSPITAL_BASED_OUTPATIENT_CLINIC_OR_DEPARTMENT_OTHER): Payer: Self-pay | Admitting: Anesthesiology

## 2011-03-27 ENCOUNTER — Ambulatory Visit (HOSPITAL_BASED_OUTPATIENT_CLINIC_OR_DEPARTMENT_OTHER): Payer: BC Managed Care – PPO | Admitting: Anesthesiology

## 2011-03-27 ENCOUNTER — Ambulatory Visit (HOSPITAL_COMMUNITY): Payer: BC Managed Care – PPO

## 2011-03-27 ENCOUNTER — Ambulatory Visit (HOSPITAL_BASED_OUTPATIENT_CLINIC_OR_DEPARTMENT_OTHER)
Admission: RE | Admit: 2011-03-27 | Discharge: 2011-03-27 | Disposition: A | Discharge status: Home / Self Care | Payer: BC Managed Care – PPO | Source: Ambulatory Visit | Attending: General Surgery | Admitting: General Surgery

## 2011-03-27 ENCOUNTER — Encounter (HOSPITAL_BASED_OUTPATIENT_CLINIC_OR_DEPARTMENT_OTHER): Payer: Self-pay | Admitting: *Deleted

## 2011-03-27 DIAGNOSIS — Z9010 Acquired absence of unspecified breast and nipple: Secondary | ICD-10-CM | POA: Insufficient documentation

## 2011-03-27 DIAGNOSIS — C50919 Malignant neoplasm of unspecified site of unspecified female breast: Secondary | ICD-10-CM

## 2011-03-27 DIAGNOSIS — R51 Headache: Secondary | ICD-10-CM | POA: Insufficient documentation

## 2011-03-27 DIAGNOSIS — IMO0001 Reserved for inherently not codable concepts without codable children: Secondary | ICD-10-CM | POA: Insufficient documentation

## 2011-03-27 DIAGNOSIS — C50419 Malignant neoplasm of upper-outer quadrant of unspecified female breast: Secondary | ICD-10-CM | POA: Insufficient documentation

## 2011-03-27 DIAGNOSIS — R443 Hallucinations, unspecified: Secondary | ICD-10-CM | POA: Insufficient documentation

## 2011-03-27 HISTORY — PX: PORTACATH PLACEMENT: SHX2246

## 2011-03-27 SURGERY — INSERTION, TUNNELED CENTRAL VENOUS DEVICE, WITH PORT
Anesthesia: General | Site: Chest | Laterality: Left | Wound class: Clean

## 2011-03-27 MED ORDER — HYDROCODONE-ACETAMINOPHEN 5-325 MG PO TABS: 1.0000 | ORAL_TABLET | ORAL | Status: AC | PRN

## 2011-03-27 MED ORDER — MIDAZOLAM HCL 2 MG/2ML IJ SOLN: 1.0000 mg | INTRAMUSCULAR | Status: DC | PRN

## 2011-03-27 MED ORDER — HEPARIN (PORCINE) IN NACL 2-0.9 UNIT/ML-% IJ SOLN
INTRAMUSCULAR | Status: DC | PRN
  Administered 2011-03-27: 1 via INTRAVENOUS

## 2011-03-27 MED ORDER — ONDANSETRON HCL 4 MG/2ML IJ SOLN: 4.0000 mg | Freq: Four times a day (QID) | INTRAMUSCULAR | Status: DC | PRN

## 2011-03-27 MED ORDER — FENTANYL CITRATE 0.05 MG/ML IJ SOLN
25.0000 ug | INTRAMUSCULAR | Status: DC | PRN
  Administered 2011-03-27: 25 ug via INTRAVENOUS
  Administered 2011-03-27: 50 ug via INTRAVENOUS

## 2011-03-27 MED ORDER — PROMETHAZINE HCL 25 MG/ML IJ SOLN: 6.2500 mg | INTRAMUSCULAR | Status: DC | PRN

## 2011-03-27 MED ORDER — CEFAZOLIN SODIUM-DEXTROSE 2-3 GM-% IV SOLR
2.0000 g | INTRAVENOUS | Status: AC
  Administered 2011-03-27: 2 g via INTRAVENOUS

## 2011-03-27 MED ORDER — LIDOCAINE HCL (CARDIAC) 20 MG/ML IV SOLN
INTRAVENOUS | Status: DC | PRN
  Administered 2011-03-27: 60 mg via INTRAVENOUS

## 2011-03-27 MED ORDER — EPHEDRINE SULFATE 50 MG/ML IJ SOLN
INTRAMUSCULAR | Status: DC | PRN
  Administered 2011-03-27 (×4): 10 mg via INTRAVENOUS

## 2011-03-27 MED ORDER — MIDAZOLAM HCL 5 MG/5ML IJ SOLN
INTRAMUSCULAR | Status: DC | PRN
  Administered 2011-03-27: 2 mg via INTRAVENOUS

## 2011-03-27 MED ORDER — LACTATED RINGERS IV SOLN
INTRAVENOUS | Status: DC
  Administered 2011-03-27: 13:00:00 via INTRAVENOUS
  Administered 2011-03-27: 10 mL/h via INTRAVENOUS

## 2011-03-27 MED ORDER — FENTANYL CITRATE 0.05 MG/ML IJ SOLN: 25.0000 ug | INTRAMUSCULAR | Status: DC | PRN

## 2011-03-27 MED ORDER — PROMETHAZINE HCL 25 MG/ML IJ SOLN: 12.5000 mg | Freq: Four times a day (QID) | INTRAMUSCULAR | Status: DC | PRN

## 2011-03-27 MED ORDER — OXYCODONE HCL 5 MG PO TABS: 5.0000 mg | ORAL_TABLET | ORAL | Status: DC | PRN

## 2011-03-27 MED ORDER — ACETAMINOPHEN 650 MG RE SUPP: 650.0000 mg | RECTAL | Status: DC | PRN

## 2011-03-27 MED ORDER — SODIUM CHLORIDE 0.9 % IV SOLN: 250.0000 mL | INTRAVENOUS | Status: DC | PRN

## 2011-03-27 MED ORDER — HEPARIN SOD (PORK) LOCK FLUSH 100 UNIT/ML IV SOLN
INTRAVENOUS | Status: DC | PRN
  Administered 2011-03-27: 200 [IU] via INTRAVENOUS

## 2011-03-27 MED ORDER — ACETAMINOPHEN 325 MG PO TABS: 650.0000 mg | ORAL_TABLET | ORAL | Status: DC | PRN

## 2011-03-27 MED ORDER — PHENYLEPHRINE HCL 10 MG/ML IJ SOLN
INTRAMUSCULAR | Status: DC | PRN
  Administered 2011-03-27 (×5): 80 ug via INTRAVENOUS

## 2011-03-27 MED ORDER — FENTANYL CITRATE 0.05 MG/ML IJ SOLN: 50.0000 ug | INTRAMUSCULAR | Status: DC | PRN

## 2011-03-27 MED ORDER — MORPHINE SULFATE 2 MG/ML IJ SOLN: 2.0000 mg | INTRAMUSCULAR | Status: DC | PRN

## 2011-03-27 MED ORDER — SODIUM CHLORIDE 0.9 % IJ SOLN: 3.0000 mL | INTRAMUSCULAR | Status: DC | PRN

## 2011-03-27 MED ORDER — HYDROMORPHONE HCL PF 1 MG/ML IJ SOLN: 0.2500 mg | INTRAMUSCULAR | Status: DC | PRN

## 2011-03-27 MED ORDER — LORAZEPAM 2 MG/ML IJ SOLN: 1.0000 mg | Freq: Once | INTRAMUSCULAR | Status: DC | PRN

## 2011-03-27 MED ORDER — PROPOFOL 10 MG/ML IV EMUL
INTRAVENOUS | Status: DC | PRN
  Administered 2011-03-27: 200 mg via INTRAVENOUS

## 2011-03-27 MED ORDER — FENTANYL CITRATE 0.05 MG/ML IJ SOLN
INTRAMUSCULAR | Status: DC | PRN
  Administered 2011-03-27: 50 ug via INTRAVENOUS

## 2011-03-27 MED ORDER — SODIUM CHLORIDE 0.9 % IJ SOLN: 3.0000 mL | Freq: Two times a day (BID) | INTRAMUSCULAR | Status: DC

## 2011-03-27 MED ORDER — SODIUM CHLORIDE 0.9 % IV SOLN: INTRAVENOUS | Status: DC

## 2011-03-27 MED ORDER — BUPIVACAINE-EPINEPHRINE PF 0.5-1:200000 % IJ SOLN
INTRAMUSCULAR | Status: DC | PRN
  Administered 2011-03-27: 20 mL

## 2011-03-27 MED ORDER — DEXAMETHASONE SODIUM PHOSPHATE 4 MG/ML IJ SOLN
INTRAMUSCULAR | Status: DC | PRN
  Administered 2011-03-27: 10 mg via INTRAVENOUS

## 2011-03-27 MED ORDER — ONDANSETRON HCL 4 MG/2ML IJ SOLN
INTRAMUSCULAR | Status: DC | PRN
  Administered 2011-03-27: 4 mg via INTRAVENOUS

## 2011-03-27 MED ORDER — CHLORHEXIDINE GLUCONATE 4 % EX LIQD: 1.0000 "application " | Freq: Once | CUTANEOUS | Status: DC

## 2011-03-27 MED ORDER — METOCLOPRAMIDE HCL 5 MG/ML IJ SOLN: 10.0000 mg | Freq: Once | INTRAMUSCULAR | Status: DC | PRN

## 2011-03-27 SURGICAL SUPPLY — 55 items
ADH SKN CLS APL DERMABOND .7 (GAUZE/BANDAGES/DRESSINGS) ×3
APL SKNCLS STERI-STRIP NONHPOA (GAUZE/BANDAGES/DRESSINGS) ×1
BAG DECANTER FOR FLEXI CONT (MISCELLANEOUS) ×2 IMPLANT
BENZOIN TINCTURE PRP APPL 2/3 (GAUZE/BANDAGES/DRESSINGS) ×1 IMPLANT
BLADE HEX COATED 2.75 (ELECTRODE) ×2 IMPLANT
BLADE SURG 15 STRL LF DISP TIS (BLADE) ×2 IMPLANT
BLADE SURG 15 STRL SS (BLADE) ×4
CANISTER SUCTION 1200CC (MISCELLANEOUS) IMPLANT
CHLORAPREP W/TINT 26ML (MISCELLANEOUS) ×2 IMPLANT
CLOTH BEACON ORANGE TIMEOUT ST (SAFETY) ×2 IMPLANT
COVER MAYO STAND STRL (DRAPES) ×2 IMPLANT
COVER PROBE 5X48 (MISCELLANEOUS)
COVER TABLE BACK 60X90 (DRAPES) ×2 IMPLANT
DECANTER SPIKE VIAL GLASS SM (MISCELLANEOUS) ×1 IMPLANT
DERMABOND ADVANCED (GAUZE/BANDAGES/DRESSINGS) ×3
DERMABOND ADVANCED .7 DNX12 (GAUZE/BANDAGES/DRESSINGS) IMPLANT
DRAPE C-ARM 42X72 X-RAY (DRAPES) ×2 IMPLANT
DRAPE LAPAROSCOPIC ABDOMINAL (DRAPES) ×2 IMPLANT
DRAPE UTILITY XL STRL (DRAPES) ×3 IMPLANT
DRSG TEGADERM 4X10 (GAUZE/BANDAGES/DRESSINGS) IMPLANT
ELECT REM PT RETURN 9FT ADLT (ELECTROSURGICAL) ×2
ELECTRODE REM PT RTRN 9FT ADLT (ELECTROSURGICAL) ×1 IMPLANT
GAUZE SPONGE 4X4 12PLY STRL LF (GAUZE/BANDAGES/DRESSINGS) ×2 IMPLANT
GLOVE ECLIPSE 6.5 STRL STRAW (GLOVE) ×1 IMPLANT
GLOVE EUDERMIC 7 POWDERFREE (GLOVE) ×2 IMPLANT
GLOVE INDICATOR 7.0 STRL GRN (GLOVE) ×1 IMPLANT
GOWN PREVENTION PLUS XLARGE (GOWN DISPOSABLE) ×2 IMPLANT
GOWN PREVENTION PLUS XXLARGE (GOWN DISPOSABLE) ×2 IMPLANT
IV CATH PLACEMENT UNIT 16 GA (IV SOLUTION) IMPLANT
IV HEPARIN 1000UNITS/500ML (IV SOLUTION) ×2 IMPLANT
IV KIT MINILOC 20X1 SAFETY (NEEDLE) IMPLANT
KIT BARDPORT ISP (Port) IMPLANT
KIT CVR 48X5XPRB PLUP LF (MISCELLANEOUS) IMPLANT
KIT POWER CATH 8FR (Catheter) ×1 IMPLANT
NDL BLUNT 17GA (NEEDLE) IMPLANT
NDL HYPO 25X1 1.5 SAFETY (NEEDLE) ×1 IMPLANT
NEEDLE BLUNT 17GA (NEEDLE) IMPLANT
NEEDLE HYPO 22GX1.5 SAFETY (NEEDLE) ×2 IMPLANT
NEEDLE HYPO 25X1 1.5 SAFETY (NEEDLE) IMPLANT
PACK BASIN DAY SURGERY FS (CUSTOM PROCEDURE TRAY) ×2 IMPLANT
PENCIL BUTTON HOLSTER BLD 10FT (ELECTRODE) ×2 IMPLANT
SET SHEATH INTRODUCER 10FR (MISCELLANEOUS) IMPLANT
SHEATH COOK PEEL AWAY SET 9F (SHEATH) IMPLANT
SLEEVE SCD COMPRESS KNEE MED (MISCELLANEOUS) IMPLANT
STRIP CLOSURE SKIN 1/2X4 (GAUZE/BANDAGES/DRESSINGS) ×1 IMPLANT
SUT MNCRL AB 4-0 PS2 18 (SUTURE) ×2 IMPLANT
SUT PROLENE 2 0 CT2 30 (SUTURE) ×2 IMPLANT
SUT VICRYL 3-0 CR8 SH (SUTURE) ×2 IMPLANT
SYR 5ML LUER SLIP (SYRINGE) ×2 IMPLANT
SYR CONTROL 10ML LL (SYRINGE) ×4 IMPLANT
TOWEL OR 17X24 6PK STRL BLUE (TOWEL DISPOSABLE) ×4 IMPLANT
TOWEL OR NON WOVEN STRL DISP B (DISPOSABLE) ×2 IMPLANT
TUBE CONNECTING 20X1/4 (TUBING) IMPLANT
WATER STERILE IRR 1000ML POUR (IV SOLUTION) ×2 IMPLANT
YANKAUER SUCT BULB TIP NO VENT (SUCTIONS) IMPLANT

## 2011-03-27 NOTE — Transfer of Care (Signed)
Immediate Anesthesia Transfer of Care Note  Patient: Kathleen Reynolds  Procedure(s) Performed: Procedure(s) (LRB): INSERTION PORT-A-CATH (Left)  Patient Location: PACU  Anesthesia Type: General  Level of Consciousness: awake and alert   Airway & Oxygen Therapy: Patient Spontanous Breathing and Patient connected to face mask oxygen  Post-op Assessment: Report given to PACU RN and Post -op Vital signs reviewed and stable  Post vital signs: Reviewed and stable  Complications: No apparent anesthesia complications

## 2011-03-27 NOTE — Anesthesia Procedure Notes (Signed)
Procedure Name: LMA Insertion Date/Time: 03/27/2011 2:37 PM Performed by: Signa Kell Pre-anesthesia Checklist: Patient identified, Emergency Drugs available, Suction available and Patient being monitored Patient Re-evaluated:Patient Re-evaluated prior to inductionOxygen Delivery Method: Circle System Utilized Preoxygenation: Pre-oxygenation with 100% oxygen Intubation Type: IV induction Ventilation: Mask ventilation without difficulty LMA: LMA with gastric port inserted LMA Size: 4.0 Number of attempts: 1 Placement Confirmation: positive ETCO2 Tube secured with: Tape Dental Injury: Teeth and Oropharynx as per pre-operative assessment

## 2011-03-27 NOTE — Anesthesia Postprocedure Evaluation (Signed)
  Anesthesia Post-op Note  Patient: Kathleen Reynolds  Procedure(s) Performed: Procedure(s) (LRB): INSERTION PORT-A-CATH (Left)  Patient Location: PACU  Anesthesia Type: General  Level of Consciousness: awake  Airway and Oxygen Therapy: Patient Spontanous Breathing  Post-op Pain: mild  Post-op Assessment: Post-op Vital signs reviewed, Patient's Cardiovascular Status Stable, Respiratory Function Stable and Patent Airway  Post-op Vital Signs: stable  Complications: No apparent anesthesia complications

## 2011-03-27 NOTE — Interval H&P Note (Signed)
History and Physical Interval Note:  03/27/2011 2:12 PM  Kathleen Reynolds  has presented today for surgery, with the diagnosis of breast cancer  The goals of treatment and the various methods of treatment have been discussed with the patient and family. After consideration of risks, benefits and other options for treatment, the patient has consented to  Procedure(s) (LRB): INSERTION PORT-A-CATH (N/A) as a surgical intervention .  The patients' history has been reviewed, the  patient is examined today , there is no no change in status, she is stable for surgery.  I have reviewed the patients' chart and labs.  Questions were answered to the patient's satisfaction.     Ernestene Mention

## 2011-03-27 NOTE — Op Note (Signed)
Patient Name:           Kathleen Reynolds   Date of Surgery:        03/27/2011  Pre op Diagnosis:      Invasive ductal carcinoma right breast, upper outer quadrant, pathologic stage TIB, N0, triple negative.  Post op Diagnosis:    same  Procedure:                 Insertion of 8 French power port venous vascular access device with fluoroscopic guidance and positioning  Surgeon:                     Angelia Mould. Derrell Lolling, M.D., FACS  Assistant:                      none  Operative Indications:   This is a 51 year old woman who has a remote history of left mastectomy for invasive cancer 1999. She apparently had a modified radical mastectomy left side and had a Port-A-Cath placed and had adjuvant chemotherapy and has no known local recurrence to date. She recently presented with an abnormal mammogram of the right breast with a small density in the upper outer quadrant of the right breast. This was biopsied as a partial mastectomy with wire localization. This revealed triple negative and invasive ductal carcinoma, 9 mm diameter. She subsequently underwent sentinel node biopsy. The nodes were negative. She has been evaluated by Dr. Glenford Peers and  he feels that she needs adjuvant chemotherapy as well as radiation therapy. He requests I put a Port-A-Cath in her. She has been counseled regarding this. She agrees. She was operated upon electively.  Operative Findings:       The Port-A-Cath was inserted into the left subclavian vein without difficulty. The catheter tip was in the superior vena cava near the right atrial junction.  Procedure in Detail:          Following the induction of general LMA anesthesia, the patient was positioned with her arms at her sides and a small roll behind her shoulders. The neck and chest were prepped and draped in a sterile fashion. Surgical time out was performed. Intravenous antibiotics were given. 0.5% Marcaine was used as a local infiltration anesthetic. A left subclavian  venipuncture was performed with a single pass and a guidewire inserted into the central venous circulation. Fluoroscopy was used to check on the position of the wire which looked good. We also used fluoroscopy to draw a template on the chest wall to plan the positioning of the the catheter properly. A small incision was made at the wire insertion site. We then made about a 3 cm incision and the left parasternal area about 3 cm below the clavicle. Some of the subcutaneous  tissue was debridement. We took this down to pectoralis fascia. Using a tunneling device I threaded  the catheter from the port pocket site to the wire insertion site. We then measured the catheter by placing it over the template of the chest wall and cut it 25 cm in length. We secured the catheter to the port with the locking device and flushed the port and catheter with heparinized saline. The port was sutured to the pectoralis fascia with 3 interrupted sutures of 2-0 Prolene. The patient was placed in Trendelenburg position. The dilator and peel-away sheath assembly was inserted over the guidewire into the central venous circulation without difficulty. The dilator and wire were removed and the catheter inserted into  the peel-away sheath and the peel-away sheath removed. We had excellent blood return from the catheter and it flushed easily. It was flushed with concentrated heparin. Fluoroscopy was used to check the position of the catheter and the catheter tip was well positioned in the distal superior vena cava and there is no deformity of the catheter along its course. There was no bleeding. The subcutaneous tissue was closed with 3-0 Vicryl sutures and the skin incisions closed with subcuticular sutures of 4-0 Monocryl and Dermabond. The patient tolerated the procedure well. There were no complications. Estimated blood loss 10 cc. Counts correct. Taken to recovery room stable.     Angelia Mould. Derrell Lolling, M.D., FACS General and Minimally  Invasive Surgery Breast and Colorectal Surgery  03/27/2011 3:27 PM

## 2011-03-27 NOTE — Anesthesia Preprocedure Evaluation (Signed)
Anesthesia Evaluation  Patient identified by MRN, date of birth, ID band Patient awake    Airway Mallampati: II TM Distance: >3 FB Neck ROM: Full    Dental   Pulmonary    Pulmonary exam normal       Cardiovascular     Neuro/Psych  Headaches,  Neuromuscular disease    GI/Hepatic   Endo/Other  Morbid obesity  Renal/GU      Musculoskeletal  (+) Fibromyalgia -  Abdominal (+) obese,   Peds  Hematology   Anesthesia Other Findings   Reproductive/Obstetrics                           Anesthesia Physical Anesthesia Plan  ASA: III  Anesthesia Plan: General   Post-op Pain Management:    Induction: Intravenous  Airway Management Planned: LMA  Additional Equipment:   Intra-op Plan:   Post-operative Plan: Extubation in OR  Informed Consent: I have reviewed the patients History and Physical, chart, labs and discussed the procedure including the risks, benefits and alternatives for the proposed anesthesia with the patient or authorized representative who has indicated his/her understanding and acceptance.     Plan Discussed with: CRNA and Surgeon  Anesthesia Plan Comments:         Anesthesia Quick Evaluation

## 2011-03-30 ENCOUNTER — Encounter (HOSPITAL_BASED_OUTPATIENT_CLINIC_OR_DEPARTMENT_OTHER): Payer: Self-pay | Admitting: General Surgery

## 2011-03-30 NOTE — Patient Instructions (Signed)
[facility]   CHEMOTHERAPY INSTRUCTIONS   POTENTIAL SIDE EFFECTS OF TREATMENT: Increased Susceptibility to Infection, Vomiting, Constipation, Hair Thinning, Changes in Character of Skin and Nails (brittleness, dryness,etc.), Pigment Changes (darkening of veins, nail beds, palms of hands, soles of feet, etc.), Bone Marrow Suppression, Blood in Urine, Complete Hair Loss, Nausea, Diarrhea, Sun Sensitivity and Mouth Sores   SELF IMAGE NEEDS AND REFERRALS MADE: Obtain hair accessories as soon as possible (wigs, scarves, turbans,caps,etc.) and Referral to Look Good, Feel Better consultant   EDUCATIONAL MATERIALS GIVEN AND REVIEWED: Chemotherapy and You, Care of Venous Access Device and Specific Instructions Sheets for Carboplatin and Taxotere.  Carboplatin - this medication can be hard on your kidneys - this is why we need you to drink 64 oz of fluid (preferably water/decaff fluids) 2 days prior to chemo and for up to 4-5 days after chemo. Drink more if you can. This will help to keep your kidneys flushed. This can cause mild hair loss, lower your platelets (which make your blood clot), lower your white blood cells (fight infection), and cause nausea/vomiting.   Taxotere - bone marrow suppression (lowers white blood cells (fight infection), lowers red blood cells (make up your blood), lowers platelets (help blood to clot). This chemo can cause fluid retention. You will be responsible for taking a steroid called Dexamethasone at home prior to and after Taxotere. This steroid will keep you from having fluid retention. Take it whether you think you need it or not. Can cause hair loss, skin/nail changes (darkening of the nail beds, pain where the nail bed meets the skin, loosening of the nail beds, dry skin, palms of hands and soles of feet may darken or get sensitive, nausea/vomiting, paresthesia (numbness or tingling) in extremities - we need to know if this develops,  mucositis (inflammation of any mucosal membrane - the mouth, throat), mouth sores, neurotoxicity (loss of memory, headaches, trouble sleeping, etc.), can also cause excessive tear production. Please let us know if any side effect develops.   SELF CARE ACTIVITIES WHILE ON CHEMOTHERAPY: Increase your fluid intake 48 hours prior to treatment and drink at least 2 quarts per day after treatment., No alcohol intake.,  No aspirin or other medications unless approved by your oncologist.,  Eat foods that are light and easy to digest., Eat foods at cold or room temperature., No fried, fatty, or spicy foods immediately before or after treatment.,  Have teeth cleaned professionally before starting treatment. Keep dentures and partial plates clean., Use soft toothbrush and do not use mouthwashes that contain alcohol. Biotene is a good mouthwash that is available at most pharmacies or may be ordered by calling [phone]., Use warm salt water gargles (1 teaspoon salt per 1 quart warm water) before and after meals and at bedtime. Or you may rinse with 2 tablespoons of three -percent hydrogen peroxide mixed in eight ounces of water.,  Always use sunscreen with SPF (Sun Protection Factor) of 30 or higher.,  Use your nausea medication as directed to prevent nausea.,  Use your stool softener or laxative as directed to prevent constipation. and Use your anti-diarrheal medication as directed to stop diarrhea.  Other Info Please wash your hands for at least 30 seconds using warm soapy water. Handwashing is the #1 way to prevent the spread of germs.  Stay away from sick people or people who are getting over a cold. If you develop respiratory systems such as green/yellow mucus production or productive cough or  persistent cough let us know and we will see if you need an antibiotic. It is a good idea to keep a pair of gloves on when going into grocery stores/[employer] to decrease your risk of coming into contact with germs on  the carts, etc. Carry alcohol hand gel with you at all times and use it frequently if out in public.  All foods need to be cooked thoroughly. No raw foods. No medium or undercooked meats, eggs. If your food is cooked medium well, it does not need to be hot pink or saturated with bloody liquid at all. Vegetables and fruits need to be washed/rinsed under the faucet with a dish detergent before being consumed. You can eat raw fruits and vegetables unless we tell you otherwise but it would be best if you cooked them or bought frozen. Do not eat off of salad bars or hot bars unless you really trust the cleanliness of the restaurant.  If you need dental work, please let Dr. Mariel Sleet know before you go for your appointment so that we can coordinate the best possible time for you in regards to your chemo regimen. You need to also let your dentist know that you are actively taking chemo. We may need to do labs prior to your dental appointment.  We also want your bowels moving at least every other day. If this is not happening, we need to know so that we can get you on a bowel regimen to help you go.     MEDICATIONS: You have been given prescriptions for the following medications: Decadron 4mg --take 2 tablets the day before day of and day after chemo. Follow directions on label and Take with food or milk Zofran 8 mg take 1 tablet twice a day starting the day after chemo and continue as needed. Other Medication Instructions: lorazepam or ativan take 1 mg every 2-3 hours as needed for nausea. Compazine suppositories --you have these available if needed. You may use one every 6 hrs as needed for nausea. Use a little bit of vaseline on the suppository to enable you to insert it rectally. Emla Cream--apply a quarter size amount to the port site and cover with saran wrap or a clear plastic dressing one hour prior to appt.  SYMPTOMS TO REPORT AS SOON AS POSSIBLE AFTER TREATMENT:  FEVER GREATER THAN 100.5  F  CHILLS WITH OR WITHOUT FEVER  NAUSEA AND VOMITING THAT IS NOT CONTROLLED WITH YOUR NAUSEA MEDICATION  UNUSUAL SHORTNESS OF BREATH  UNUSUAL BRUISING OR BLEEDING  TENDERNESS IN MOUTH AND THROAT WITH OR WITHOUT PRESENCE OF ULCERS  URINARY PROBLEMS  BOWEL PROBLEMS  UNUSUAL RASH    Wear comfortable clothing and clothing appropriate for easy access to any Portacath or PICC line. Let us know if there is anything that we can do to make your therapy better!      I have been informed and understand all of the instructions given to me and have received a copy. I have been instructed to call the clinic [phone] or my family physician as soon as possible for continued medical care, if indicated. I do not have any more questions at this time but understand that I may call the Cancer Center or the Patient Navigator at [phone] during office hours should I have questions or need assistance in obtaining follow-up care.      _________________________________________      _______________     __________ Signature of Patient or Authorized Representative  Date                            Time      _________________________________________ Nurse's Signature

## 2011-03-31 ENCOUNTER — Emergency Department (HOSPITAL_COMMUNITY): Payer: BC Managed Care – PPO

## 2011-03-31 ENCOUNTER — Inpatient Hospital Stay (HOSPITAL_COMMUNITY): Payer: BC Managed Care – PPO

## 2011-03-31 ENCOUNTER — Encounter (HOSPITAL_COMMUNITY): Payer: Self-pay | Admitting: *Deleted

## 2011-03-31 ENCOUNTER — Telehealth (INDEPENDENT_AMBULATORY_CARE_PROVIDER_SITE_OTHER): Payer: Self-pay

## 2011-03-31 ENCOUNTER — Emergency Department (HOSPITAL_COMMUNITY)
Admission: EM | Admit: 2011-03-31 | Discharge: 2011-03-31 | Disposition: A | Discharge status: Home / Self Care | Payer: BC Managed Care – PPO | Attending: Emergency Medicine | Admitting: Emergency Medicine

## 2011-03-31 ENCOUNTER — Other Ambulatory Visit (HOSPITAL_COMMUNITY): Payer: Self-pay | Admitting: Oncology

## 2011-03-31 DIAGNOSIS — C50919 Malignant neoplasm of unspecified site of unspecified female breast: Secondary | ICD-10-CM | POA: Insufficient documentation

## 2011-03-31 DIAGNOSIS — R5383 Other fatigue: Secondary | ICD-10-CM | POA: Insufficient documentation

## 2011-03-31 DIAGNOSIS — R0789 Other chest pain: Secondary | ICD-10-CM

## 2011-03-31 DIAGNOSIS — R071 Chest pain on breathing: Secondary | ICD-10-CM | POA: Insufficient documentation

## 2011-03-31 DIAGNOSIS — R0602 Shortness of breath: Secondary | ICD-10-CM | POA: Insufficient documentation

## 2011-03-31 DIAGNOSIS — Z9889 Other specified postprocedural states: Secondary | ICD-10-CM | POA: Insufficient documentation

## 2011-03-31 DIAGNOSIS — Z95828 Presence of other vascular implants and grafts: Secondary | ICD-10-CM

## 2011-03-31 DIAGNOSIS — R5381 Other malaise: Secondary | ICD-10-CM | POA: Insufficient documentation

## 2011-03-31 MED ORDER — OXYCODONE-ACETAMINOPHEN 5-325 MG PO TABS
2.0000 | ORAL_TABLET | Freq: Once | ORAL | Status: AC
  Administered 2011-03-31: 2 via ORAL
  Filled 2011-03-31: qty 2

## 2011-03-31 MED ORDER — ONDANSETRON HCL 8 MG PO TABS: ORAL_TABLET | ORAL | Status: DC

## 2011-03-31 MED ORDER — PROCHLORPERAZINE 25 MG RE SUPP: 25.0000 mg | Freq: Two times a day (BID) | RECTAL | Status: DC | PRN

## 2011-03-31 MED ORDER — LORAZEPAM 0.5 MG PO TABS: 0.5000 mg | ORAL_TABLET | Freq: Four times a day (QID) | ORAL | Status: AC | PRN

## 2011-03-31 MED ORDER — DEXAMETHASONE 4 MG PO TABS: ORAL_TABLET | ORAL | Status: DC

## 2011-03-31 NOTE — Progress Notes (Signed)
Spoke with Dr Bebe Shaggy regarding pt's workup- workup negative. cxr ok. Pt mainly c/o sore throat and scratchy throat.  Pt tolerating liquids in ED.   Probably ET irritation. rec throat lozenges. Ok to d/c home from our perspective.  Mary Sella. Andrey Campanile, MD, FACS General, Bariatric, & Minimally Invasive Surgery [facility], [place]

## 2011-03-31 NOTE — ED Notes (Signed)
Pt states "had the port put in last Friday but since Sunday, it feels like it's choking me, when I stretch my neck or take deep breaths I can feel it, it has never been used'

## 2011-03-31 NOTE — Discharge Instructions (Signed)
RETURN FOR SHORTNESS OF BREATH, WEAKNESS, WORSENED PAIN, FEVER ABOVE 100.76F OR STIFF NECK OVER THE NEXT 24-48 HOURS

## 2011-03-31 NOTE — Telephone Encounter (Signed)
Pt calling in c/o shortness of breath after getting a power port in on 03-27-11 by Dr Derrell Lolling. The pt noticed on Sunday she started having some shortness of breath that has just gotten worse over the last days to were she is sounding really scared on the phone. I asked if pt had someone there with her at the home with her that knew she was on the phone with me and she did so I told I was going to go ask Dr Derrell Lolling what he would advise for the pt to do. Per Dr Derrell Lolling the pt needs to go to [facility] ER right now so she can be checked out she needs a CXR. I will notify Tresa Endo the P.A.   I notified Tresa Endo who was in the OR with Dr Andrey Campanile so they are both made aware.

## 2011-03-31 NOTE — Progress Notes (Signed)
All pre[facility]s [facility] [place], [place]. Chemo teaching done for Carbo and taxotere and neulasta. Consent signed.

## 2011-03-31 NOTE — ED Notes (Signed)
Patient given discharge instructions, information, prescriptions, and diet order. Patient states that they adequately understand discharge information given and to return to ED if symptoms return or worsen.     

## 2011-03-31 NOTE — ED Provider Notes (Signed)
History     CSN: [id number]  Arrival date & time 03/31/11  1507   First MD Initiated Contact with Patient 03/31/11 1544      Chief Complaint  Patient presents with  . Vascular Access Problem     The history is provided by the patient.  vascular access problem Onset - two days ago Course - worsening Duration - intermittent Worsened by - movement Improved by - rest Associated symptoms - chest wall pain  Pt had vascular access placed last week in anticipation for chemotherapy to begin for breast cancer Two days after it was placed, she noticed pain around the site, and some pain with swallowing.  She reports mild SOB.  She is able to swallow fluids She feels that her "voice is changing" No abd pain No focal extremity weakness, no facial weakness   Past Medical History  Diagnosis Date  . Migraines   . Breast mass in female     right  . Arthritis   . Hyperlipidemia   . Breast cancer     Right  . Left rotator cuff tear   . Morbid obesity   . Anemia LIFELONG    HEAVY MENSES MAR 2012 HB 13 MCV 86.7 FERRITIN  66    Past Surgical History  Procedure Date  . Dilation and curettage of uterus   . Colonoscopy 2004 DR. SMITH    No polyp, hemorrhoids  . Colonoscopy 06/09/10    simple adenomas   . Hystersonogram 2009  . Dilation and curettage of uterus 05/2002  . Mastectomy 1999    left breast cancer   . Breast surgery 11/1997    Left breast cancer-mastectomy  . Breast biopsy 02/13/2011    Procedure: BREAST BIOPSY WITH NEEDLE LOCALIZATION;  Surgeon: Ernestene Mention, MD;  Location: [facility];  Service: General;  Laterality: Right;  . Breast surgery 2/13-    rt axilly bx  . Portacath placement 03/27/2011    Procedure: INSERTION PORT-A-CATH;  Surgeon: Ernestene Mention, MD;  Location: [facility];  Service: General;  Laterality: Left;  insert port a cath    Family History  Problem Relation Age of Onset  . Hypertension Mother   . Cancer Father       lung cancer  . Diabetes Sister   . Cancer Brother     colon cancer  . Heart disease Sister     heart attack    History  Substance Use Topics  . Smoking status: Never Smoker   . Smokeless tobacco: Never Used  . Alcohol Use: 1.0 oz/week    2 drink(s) per week     occasional    OB History    Grav Para Term Preterm Abortions TAB SAB Ect Mult Living                  Review of Systems  All other systems reviewed and are negative.    Allergies  Ibuprofen  Home Medications   Current Outpatient Rx  Name Route Sig Dispense Refill  . ASPIRIN 81 MG PO CHEW Oral Chew 81 mg by mouth daily.      Marland Kitchen BUTALBITAL-APAP-CAFFEINE 50-325-40 MG PO TABS Oral Take 1 tablet by mouth 2 (two) times daily as needed. For migraine    . CYCLOBENZAPRINE HCL 10 MG PO TABS Oral Take 10 mg by mouth 2 (two) times daily as needed. For muscle spasms    . DIAZEPAM 5 MG PO TABS Oral Take 5 mg  by mouth every 6 (six) hours as needed. pain    . FERROUS SULFATE DRIED ER 160 (50 FE) MG PO TBCR Oral Take 160 mg by mouth daily.     Marland Kitchen FLAXSEED OIL 1000 MG PO CAPS Oral Take 2,000 mg by mouth daily.     Marland Kitchen HYDROCODONE-ACETAMINOPHEN 5-325 MG PO TABS Oral Take 1-2 tablets by mouth every 4 (four) hours as needed for pain. 30 tablet 1  . MECLIZINE HCL 25 MG PO TABS Oral Take 25 mg by mouth 3 (three) times daily as needed. For dizziness    . ADULT MULTIVITAMIN W/MINERALS CH Oral Take 1 tablet by mouth daily.      BP 179/81  Pulse 106  Temp 98.5 F (36.9 C)  Resp 20  Wt 285 lb (129.275 kg)  SpO2 99%  LMP 03/27/2011  Physical Exam CONSTITUTIONAL: Well developed/well nourished HEAD AND FACE: Normocephalic/atraumatic EYES: EOMI/PERRL ENMT: Mucous membranes moist, no stridor NECK: supple no meningeal signs, no crepitance SPINE:entire spine nontender CV: S1/S2 noted, no murmurs/rubs/gallops noted LUNGS: Lungs are clear to auscultation bilaterally, no apparent distress Chest - port noted in left chest, mild  tenderness to palpation, no drainage/erythema noted No crepitance ABDOMEN: soft, nontender, no rebound or guarding GU:no cva tenderness NEURO: Pt is awake/alert, moves all extremitiesx4, no facial weakness noted EXTREMITIES: pulses normal, full ROM SKIN: warm, color normal PSYCH: no abnormalities of mood noted  ED Course  Procedures   4:07 PM Will start with CXR/soft tissue neck  4:55 PM D/w dr Andrey Campanile, on surgery He reviewed xrays - no acute disease.  She was intubated for procedure and it is possible some of her symptoms are after the intubation. Safe for d/c home and f/u as outpatient  5:05 PM Pt well appearing, no distress. I explained xray results.  I also discussed need for pain meds (she has pain meds at home).  She is still concerned "that it is moving" but I assured her of unremarkable xray results.  We discussed strict return precautions.  I also advised close f/u tomorrow with surgery.  The patient appears reasonably screened and/or stabilized for discharge and I doubt any other medical condition or other [facility] requiring further screening, evaluation, or treatment in the ED at this time prior to discharge.    MDM  Nursing notes reviewed and considered in documentation xrays reviewed and considered         Joya Gaskins, MD 03/31/11 [phone]

## 2011-03-31 NOTE — Progress Notes (Signed)
Patient ID: Kathleen Reynolds, female   DOB: 09-20-1960, 51 y.o.   MRN: [id number] I went to visit the patient in the [facility] after receiving a call she was coming with SOB s/p placement of power port on Friday.  Upon, my arrival the patient was in x-ray.  Unable to evaluate her at this time.  Mahaila Tischer E 4:24 PM

## 2011-04-01 ENCOUNTER — Inpatient Hospital Stay (HOSPITAL_COMMUNITY): Payer: BC Managed Care – PPO

## 2011-04-01 ENCOUNTER — Encounter (HOSPITAL_BASED_OUTPATIENT_CLINIC_OR_DEPARTMENT_OTHER): Payer: Medicaid Other

## 2011-04-01 DIAGNOSIS — C50911 Malignant neoplasm of unspecified site of right female breast: Secondary | ICD-10-CM

## 2011-04-01 DIAGNOSIS — C50919 Malignant neoplasm of unspecified site of unspecified female breast: Secondary | ICD-10-CM

## 2011-04-01 DIAGNOSIS — D509 Iron deficiency anemia, unspecified: Secondary | ICD-10-CM

## 2011-04-01 DIAGNOSIS — C50419 Malignant neoplasm of upper-outer quadrant of unspecified female breast: Secondary | ICD-10-CM

## 2011-04-02 ENCOUNTER — Telehealth (HOSPITAL_COMMUNITY): Payer: Self-pay | Admitting: Oncology

## 2011-04-02 ENCOUNTER — Encounter (INDEPENDENT_AMBULATORY_CARE_PROVIDER_SITE_OTHER): Payer: BC Managed Care – PPO | Admitting: General Surgery

## 2011-04-03 ENCOUNTER — Encounter (HOSPITAL_COMMUNITY): Payer: BC Managed Care – PPO | Attending: Oncology

## 2011-04-03 ENCOUNTER — Other Ambulatory Visit (HOSPITAL_COMMUNITY): Payer: Self-pay | Admitting: Oncology

## 2011-04-03 DIAGNOSIS — C50419 Malignant neoplasm of upper-outer quadrant of unspecified female breast: Secondary | ICD-10-CM

## 2011-04-03 DIAGNOSIS — C50919 Malignant neoplasm of unspecified site of unspecified female breast: Secondary | ICD-10-CM | POA: Insufficient documentation

## 2011-04-03 DIAGNOSIS — C50911 Malignant neoplasm of unspecified site of right female breast: Secondary | ICD-10-CM

## 2011-04-03 DIAGNOSIS — D509 Iron deficiency anemia, unspecified: Secondary | ICD-10-CM | POA: Insufficient documentation

## 2011-04-03 DIAGNOSIS — Z5111 Encounter for antineoplastic chemotherapy: Secondary | ICD-10-CM

## 2011-04-03 LAB — DIFFERENTIAL
Basophils Absolute: 0 10*3/uL (ref 0.0–0.1)
Basophils Relative: 0 % (ref 0–1)
Eosinophils Absolute: 0 10*3/uL (ref 0.0–0.7)
Eosinophils Relative: 0 % (ref 0–5)

## 2011-04-03 LAB — CBC
MCH: 31.3 pg (ref 26.0–34.0)
MCV: 90.1 fL (ref 78.0–100.0)
Platelets: 287 10*3/uL (ref 150–400)
RDW: 14.4 % (ref 11.5–15.5)

## 2011-04-03 LAB — COMPREHENSIVE METABOLIC PANEL
ALT: 17 U/L (ref 0–35)
AST: 19 U/L (ref 0–37)
Albumin: 3.9 g/dL (ref 3.5–5.2)
Calcium: 9.5 mg/dL (ref 8.4–10.5)
GFR calc Af Amer: >90 mL/min (ref >90)
Sodium: 136 mEq/L (ref 135–145)
Total Protein: 7.6 g/dL (ref 6.0–8.3)

## 2011-04-03 MED ORDER — SODIUM CHLORIDE 0.9 % IV SOLN: 16.0000 mg | Freq: Once | INTRAVENOUS | Status: DC

## 2011-04-03 MED ORDER — DEXAMETHASONE SODIUM PHOSPHATE 4 MG/ML IJ SOLN: 20.0000 mg | Freq: Once | INTRAMUSCULAR | Status: DC

## 2011-04-03 MED ORDER — HEPARIN SOD (PORK) LOCK FLUSH 100 UNIT/ML IV SOLN
INTRAVENOUS | Status: AC
  Administered 2011-04-03: 500 [IU]
  Filled 2011-04-03: qty 5

## 2011-04-03 MED ORDER — DOCETAXEL CHEMO INJECTION 160 MG/16ML
75.0000 mg/m2 | Freq: Once | INTRAVENOUS | Status: AC
  Administered 2011-04-03: 180 mg via INTRAVENOUS
  Filled 2011-04-03 (×4): qty 18

## 2011-04-03 MED ORDER — SODIUM CHLORIDE 0.9 % IV SOLN
Freq: Once | INTRAVENOUS | Status: AC
  Administered 2011-04-03: 09:00:00 via INTRAVENOUS

## 2011-04-03 MED ORDER — SODIUM CHLORIDE 0.9 % IV SOLN
900.0000 mg | Freq: Once | INTRAVENOUS | Status: AC
  Administered 2011-04-03: 900 mg via INTRAVENOUS
  Filled 2011-04-03: qty 90

## 2011-04-03 MED ORDER — HEPARIN SOD (PORK) LOCK FLUSH 100 UNIT/ML IV SOLN
500.0000 [IU] | Freq: Once | INTRAVENOUS | Status: AC | PRN
  Administered 2011-04-03: 500 [IU]
  Filled 2011-04-03: qty 5

## 2011-04-03 MED ORDER — SODIUM CHLORIDE 0.9 % IJ SOLN
10.0000 mL | INTRAMUSCULAR | Status: DC | PRN
  Filled 2011-04-03: qty 10

## 2011-04-03 MED ORDER — SODIUM CHLORIDE 0.9 % IV SOLN
Freq: Once | INTRAVENOUS | Status: AC
  Administered 2011-04-03: 16 mg via INTRAVENOUS
  Filled 2011-04-03: qty 8

## 2011-04-03 NOTE — Progress Notes (Signed)
taxotere titrated per protocol and pt tolerated well.  Pt tolerated chemo well.

## 2011-04-04 ENCOUNTER — Encounter (HOSPITAL_BASED_OUTPATIENT_CLINIC_OR_DEPARTMENT_OTHER): Payer: BC Managed Care – PPO

## 2011-04-04 VITALS — BP 129/77 | HR 79

## 2011-04-04 DIAGNOSIS — C50419 Malignant neoplasm of upper-outer quadrant of unspecified female breast: Secondary | ICD-10-CM

## 2011-04-04 DIAGNOSIS — Z5189 Encounter for other specified aftercare: Secondary | ICD-10-CM

## 2011-04-04 DIAGNOSIS — C50911 Malignant neoplasm of unspecified site of right female breast: Secondary | ICD-10-CM

## 2011-04-04 MED ORDER — PEGFILGRASTIM INJECTION 6 MG/0.6ML
SUBCUTANEOUS | Status: AC
  Administered 2011-04-04: 6 mg via SUBCUTANEOUS
  Filled 2011-04-04: qty 0.6

## 2011-04-04 MED ORDER — PEGFILGRASTIM INJECTION 6 MG/0.6ML
6.0000 mg | Freq: Once | SUBCUTANEOUS | Status: AC
  Administered 2011-04-04: 6 mg via SUBCUTANEOUS

## 2011-04-04 NOTE — Progress Notes (Signed)
Tolerated injection well.  Did well post chemo.  Did c/o headache.  Took advil at home.

## 2011-04-07 ENCOUNTER — Telehealth (HOSPITAL_COMMUNITY): Payer: Self-pay

## 2011-04-07 NOTE — Telephone Encounter (Signed)
Kathleen Reynolds's husband stated that she was sleeping.  He stated that she had been very weak and had no energy. "other than that, she's fine." Instructed to call clinic with changes or worsening conditions.

## 2011-04-15 ENCOUNTER — Encounter (HOSPITAL_BASED_OUTPATIENT_CLINIC_OR_DEPARTMENT_OTHER): Payer: BC Managed Care – PPO | Admitting: Oncology

## 2011-04-15 ENCOUNTER — Encounter (HOSPITAL_COMMUNITY): Payer: Self-pay | Admitting: Oncology

## 2011-04-15 VITALS — BP 110/75 | HR 76 | Temp 97.9°F | Wt 284.3 lb

## 2011-04-15 DIAGNOSIS — Z171 Estrogen receptor negative status [ER-]: Secondary | ICD-10-CM

## 2011-04-15 DIAGNOSIS — C50911 Malignant neoplasm of unspecified site of right female breast: Secondary | ICD-10-CM

## 2011-04-15 DIAGNOSIS — Z31438 Encounter for other genetic testing of female for procreative management: Secondary | ICD-10-CM

## 2011-04-15 DIAGNOSIS — Z853 Personal history of malignant neoplasm of breast: Secondary | ICD-10-CM

## 2011-04-15 DIAGNOSIS — C50419 Malignant neoplasm of upper-outer quadrant of unspecified female breast: Secondary | ICD-10-CM

## 2011-04-15 DIAGNOSIS — C50919 Malignant neoplasm of unspecified site of unspecified female breast: Secondary | ICD-10-CM

## 2011-04-15 NOTE — Progress Notes (Signed)
Kathleen Overman, MD, MD [address] [place] [address]  1. NEOPLASM, MALIGNANT, BREAST, LEFT  CBC, Differential, Basic metabolic panel, Comprehensive metabolic panel, CBC, Differential  2. Breast cancer, right breast  CBC, Differential, Basic metabolic panel, Comprehensive metabolic panel, CBC, Differential    CURRENT THERAPY: S/P 1 cycle of Taxotere and carboplatin started on 04/03/11 followed by neulasta su[pport on day 2.  This will be administered on an every 21 day interval  Cycle II is scheduled for 04/23/2011.  INTERVAL HISTORY: Kathleen Reynolds 51 y.o. 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 Derrell Lolling on 02/13/2011, now followed by sentinel node biopsy, which was negative. Her cancer was ER negative, PR negative, Ki-67 marker high at 54%, HER2/neu was negative.  She also has a history of left sided breast cancer.   The patient reports that she is on the mend from her first cycle of chemotherapy.  She reports that her appetite and strength were significantly decreased following cycle 1 about 3-4 days following administration.  She reports that she stayed in bed for 2 days Sunday-Monday.  However, today she explains that she is much better.  We discussed this fatigue as being a side effect of her chemotherapy.  She was previously treated for a left sided breast cancer in the past and this chemotherapy may be a significant insult to the bone marrow. She understands this.  We will perform a CBC diff 10 days after cycle 2 administration to observe her counts.  She is receiving Neulasta support on Day 2 of treatment.  The goal is to administer 4 cycles with the ability to deliver 6 cycles total.  We will monitor her status following each cycle.   The patient notes a hyperpigmented area on the anterior aspect of her hairline.  She explains that it started with dry skins.   She put petroleum jelly on this area and the dryness subsided.  Now she is putting cocoa butter on it.  I have asked her to keep an eye on this over the next few days.  If it worsens, we will entertain anti-fungal medication (cream) in the event that this may be tinea. She denies that it is pruritic or painful today.  She asks how she can ascertain disability.  She will investigate this further and I offered our assistance in any way possible.    Past Medical History  Diagnosis Date  . Migraines   . Breast mass in female     right  . Arthritis   . Hyperlipidemia   . Breast cancer     Right  . Left rotator cuff tear   . Morbid obesity   . Anemia LIFELONG    HEAVY MENSES MAR 2012 HB 13 MCV 86.7 FERRITIN  66    has NEOPLASM, MALIGNANT, BREAST, LEFT; GOITER; DYSLIPIDEMIA; OBESITY, UNSPECIFIED; SHOULDER PAIN; HIP PAIN, RIGHT; TENDINITIS; RUPTURE ROTATOR CUFF; FATIGUE; HEADACHE; INGUINAL PAIN, RIGHT; NECK SPASM; Screening for colon cancer; History of iron deficiency anemia; Toe pain, left; Insomnia; Neck muscle spasm; Prediabetes; Colonic adenoma; and Invasive ductal carcinoma of right breast, stage 1 on her problem list.     is allergic to ibuprofen.  [name] had no medications administered during this visit.  Past Surgical History  Procedure Date  . Dilation and curettage of uterus   . Colonoscopy 2004 DR. SMITH    No polyp, hemorrhoids  .  Colonoscopy 06/09/10    simple adenomas   . Hystersonogram 2009  . Dilation and curettage of uterus 05/2002  . Mastectomy 1999    left breast cancer   . Breast surgery 11/1997    Left breast cancer-mastectomy  . Breast biopsy 02/13/2011    Procedure: BREAST BIOPSY WITH NEEDLE LOCALIZATION;  Surgeon: Ernestene Mention, MD;  Location: [facility];  Service: General;  Laterality: Right;  . Breast surgery 2/13-    rt axilly bx  . Portacath placement 03/27/2011    Procedure: INSERTION PORT-A-CATH;  Surgeon: Ernestene Mention, MD;   Location: [facility];  Service: General;  Laterality: Left;  insert port a cath    Denies any headaches, dizziness, double vision, fevers, chills, night sweats, nausea, vomiting, diarrhea, constipation, chest pain, heart palpitations, shortness of breath, blood in stool, black tarry stool, urinary pain, urinary burning, urinary frequency, hematuria.   PHYSICAL EXAMINATION  ECOG PERFORMANCE STATUS: 1 - Symptomatic but completely ambulatory  Filed Vitals:   04/15/11 0929  BP: 110/75  Pulse: 76  Temp: 97.9 F (36.6 C)    GENERAL:alert, no distress, well nourished, well developed, comfortable, cooperative, obese and smiling SKIN: skin color, texture, turgor are normal, no rashes or significant lesions HEAD: Normocephalic, No masses, lesions, tenderness or abnormalities.  Hyperpigmented area at hairline EYES: right medial sclera injected with clear drainage.  Otherwise unremarkable EARS: External ears normal OROPHARYNX:mucous membranes are moist  NECK: supple, no adenopathy, thyroid normal size, non-tender, without nodularity, no stridor, non-tender, trachea midline LYMPH:  no palpable lymphadenopathy, no hepatosplenomegaly BREAST:right breast normal without mass, skin or nipple changes or axillary nodes, lumpectomy scar noted, leftsided post-mastectomy site well healed and free of suspicious changes LUNGS: clear to auscultation and percussion HEART: regular rate & rhythm, no murmurs, no gallops, S1 normal and S2 normal ABDOMEN:abdomen soft, non-tender, obese, normal bowel sounds, no masses or organomegaly and no hepatosplenomegaly BACK: Back symmetric, no curvature., No CVA tenderness EXTREMITIES:less then 2 second capillary refill, no joint deformities, effusion, or inflammation, no edema, no skin discoloration, no clubbing, no cyanosis  NEURO: alert & oriented x 3 with fluent speech, no focal motor/sensory deficits, gait normal   LABORATORY DATA: CBC    Component  Value Date/Time   WBC 14.6* 04/03/2011 0818   RBC 4.06 04/03/2011 0818   HGB 12.7 04/03/2011 0818   HCT 36.6 04/03/2011 0818   PLT 287 04/03/2011 0818   MCV 90.1 04/03/2011 0818   MCH 31.3 04/03/2011 0818   MCHC 34.7 04/03/2011 0818   RDW 14.4 04/03/2011 0818   LYMPHSABS 1.5 04/03/2011 0818   MONOABS 0.4 04/03/2011 0818   EOSABS 0.0 04/03/2011 0818   BASOSABS 0.0 04/03/2011 0818      Chemistry      Component Value Date/Time   NA 136 04/03/2011 0818   K 3.6 04/03/2011 0818   CL 99 04/03/2011 0818   CO2 17* 04/03/2011 0818   BUN 13 04/03/2011 0818   CREATININE 0.60 04/03/2011 0818      Component Value Date/Time   CALCIUM 9.5 04/03/2011 0818   ALKPHOS 75 04/03/2011 0818   AST 19 04/03/2011 0818   ALT 17 04/03/2011 0818   BILITOT 0.2* 04/03/2011 0818       PATHOLOGY: 02/13/2011  Diagnosis 1. Breast, lumpectomy, right partial - INVASIVE GRADE III DUCTAL CARCINOMA, SPANNING 0.9 CM. - LYMPH/VASCULAR INVASION NOT IDENTIFIED. - INVASIVE CARCINOMA IS CLOSE TO POSTERIOR MARGIN (LESS THAN 0.1 CM). - OTHER MARGINS ARE NEGATIVE. -  SEE ONCOLOGY TEMPLATE. 2. Breast, excision, right deep margin - BENIGN BREAST PARENCHYMA. - NO IN SITU OR INVASIVE CARCINOMA IDENTIFIED. 3. Breast, excision, right superior - BENIGN BREAST PARENCHYMA. - NO IN SITU OR INVASIVE CARCINOMA IDENTIFIED. 4. Breast, excision, right inferior - BENIGN BREAST PARENCHYMA. - NO IN SITU OR INVASIVE CARCINOMA IDENTIFIED. Microscopic Comment 1. BREAST, INVASIVE TUMOR, WITH LYMPH NODE SAMPLING Specimen, including laterality: Right partial lumpectomy. Procedure: Right partial lumpectomy. Grade: III Tubule formation: 3 Nuclear pleomorphism: 2 Mitotic:3 Tumor size (glass slide measurement and gross measurement): 0.9 cm. Margins: Invasive, distance to closest margin: Less than 0.1 cm away from posterior margin on original specimen; right additional deep margin is negative (specimen #2). Lymphovascular invasion: Not identified. Ductal carcinoma in situ:  No. Lobular neoplasia: No. Tumor focality: Unifocal 1 of 3 FINAL for CING, [place] ([id number]) Microscopic Comment(continued) Treatment effect: N/A Extent of tumor: Tumor confined to breast parenchyma. Breast prognostic profile: Will be performed on current case. Non-neoplastic breast: Unremarkable. TNM: pT1b, pNX, MX. Comments: Dr. Colonel Bald has seen the margin in consultation with agreement of the margin status. (RAH:gt, 02/16/11) Zandra Abts MD Pathologist, Electronic Signature (Case signed 02/16/2011) 1. PROGNOSTIC INDICATORS - ACIS Results IMMUNOHISTOCHEMICAL AND MORPHOMETRIC ANALYSIS BY THE AUTOMATED CELLULAR IMAGING SYSTEM (ACIS) This carcinoma shows the following breast prognostic profile. Estrogen Receptor (Negative, <1%): 0%, NEGATIVE Progesterone Receptor (Negative, <1%): 0%, NEGATIVE Proliferation Marker Ki67 by M IB-1 (Low<20%): 54% COMMENT: The negative hormone receptor study(ies) in this case have an internal positive control. All controls stained appropriately Abigail Miyamoto MD Pathologist, Electronic Signature ( Signed 02/20/2011) 1. CHROMOGENIC IN-SITU HYBRIDIZATION Interpretation HER-2/NEU BY CISH - NO AMPLIFICATION OF HER-2 DETECTED. THE RATIO OF HER-2: CEP 17 SIGNALS WAS 1.23. Reference range: Ratio: HER2:CEP17 < 1.8 - gene amplification not observed Ratio: HER2:CEP 17 1.8-2.2 - equivocal result Ratio: HER2:CEP17 > 2.2 - gene amplification observed Abigail Miyamoto MD Pathologist, Electronic Signature ( Signed 02/19/2011)   03/17/2011 Diagnosis 1. Lymph node, sentinel, biopsy, Right axillary - ONE BENIGN LYMPH NODE WITH NO TUMOR SEEN (0/1). 2. Lymph node, sentinel, biopsy, Right axillary - ONE BENIGN LYMPH NODE WITH NO TUMOR SEEN (0/1). Zandra Abts MD Pathologist, Electronic Signature (Case signed 03/18/2011)    ASSESSMENT:  1. 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  Derrell Lolling on 02/13/2011, now followed by sentinel node biopsy, which was negative, done just the other day. Her cancer was ER negative, PR negative, Ki-67 marker high at 54%, HER2/neu was negative.  2. History of left-sided breast cancer in October 1999, status post lumpectomy initially, followed by mastectomy because of positive margins. This was a triple-negative cancer, as well. Seven nodes were negative. It was grade 3, 2.3 cm in size, with a high mitotic index, and I treated her with AC x 4 cycles, followed by Taxol x 4 cycles, completed as of 06/05/1998. Ki-67 marker was 50% at that time.  3. BRCA1 positivity in her family; her aunt, who is my patient, and her cousin are both BRCA1 positive, and she has now agreed to be seen by the genetics clinic for consultation, though she probably will not be able to afford it; so hopefully, they will be able to fund it.  4. Iron-deficiency anemia in the past.  5. History of cessation of menses in December 2010.  6. Spontaneous miscarriage in 2005.  7. D and C in October 2008.  8. Negative colonoscopy 2003.   PLAN:  1. Pre-chemo lab work  ordered: CBC diff, CMT/BMET alternating every 21 days 2. Lab work 10 days after cycle 2 of chemotherapy. 3. Continue with Cocoa butter at hairline.  If no better or worse within the next few days, will consider Anti-fungal cream/Diflucan for possible tinea infection. 4. Pre-chemo lab work the day of therapy. 5. Patient is going to appeal her disability denial.  6. The goal is to administer 4 cycles of chemotherapy with the ability to deliver 6 cycles total pending her counts due to her previous treatment for left sided breast cancer. 7. Return in 3 weeks for follow-up.  All questions were answered. The patient knows to call the clinic with any problems, questions or concerns. We can certainly see the patient much sooner if necessary.  The patient and plan discussed with Glenford Peers, MD and he is in agreement with the  aforementioned.  Kylie Simmonds

## 2011-04-15 NOTE — Patient Instructions (Signed)
Kathleen Reynolds  [id number] May 08, 1960   [facility]  Discharge Instructions  RECOMMENDATIONS MADE BY THE CONSULTANT AND ANY TEST RESULTS WILL BE SENT TO YOUR REFERRING DOCTOR.   EXAM FINDINGS BY MD TODAY AND SIGNS AND SYMPTOMS TO REPORT TO CLINIC OR PRIMARY MD: If area at the edge of your scalp worsens let us know.  We need to have PA or MD check area before next treatment.  We will also check your blood work 10 days after next chemotherapy.  MEDICATIONS PRESCRIBED: none   INSTRUCTIONS GIVEN AND DISCUSSED: Other :  Report uncontrolled nausea, vomiting, fevers, if areas on scalp worsens, etc.  SPECIAL INSTRUCTIONS/FOLLOW-UP: Return to Clinic:  As scheduled for treatment.   I acknowledge that I have been informed and understand all the instructions given to me and received a copy. I do not have any more questions at this time, but understand that I may call the Specialty Clinic at [facility] at [phone] during business hours should I have any further questions or need assistance in obtaining follow-up care.    __________________________________________  _____________  __________ Signature of Patient or Authorized Representative            Date                   Time    __________________________________________ Nurse's Signature

## 2011-04-15 NOTE — Progress Notes (Signed)
No nausea or vomiting after chemotherapy on 3/8 but became very weak and washed out feeling  Sunday and lasted through Wednesday.  After shampooing hair on Sunday noted irritated and flaky areas along edges of scalp in front.  Has been putting oil on areas and now area have a darker pigment from the rest of her skin.  Denies any itching or burning.

## 2011-04-17 ENCOUNTER — Other Ambulatory Visit (HOSPITAL_COMMUNITY): Payer: BC Managed Care – PPO

## 2011-04-21 ENCOUNTER — Ambulatory Visit (HOSPITAL_COMMUNITY): Payer: BC Managed Care – PPO | Admitting: Oncology

## 2011-04-23 ENCOUNTER — Encounter (HOSPITAL_COMMUNITY): Payer: BC Managed Care – PPO

## 2011-04-23 ENCOUNTER — Encounter (HOSPITAL_BASED_OUTPATIENT_CLINIC_OR_DEPARTMENT_OTHER): Payer: BC Managed Care – PPO

## 2011-04-23 VITALS — BP 119/72 | HR 98 | Temp 97.0°F | Wt 285.8 lb

## 2011-04-23 DIAGNOSIS — C50911 Malignant neoplasm of unspecified site of right female breast: Secondary | ICD-10-CM

## 2011-04-23 DIAGNOSIS — C50919 Malignant neoplasm of unspecified site of unspecified female breast: Secondary | ICD-10-CM

## 2011-04-23 DIAGNOSIS — C50419 Malignant neoplasm of upper-outer quadrant of unspecified female breast: Secondary | ICD-10-CM

## 2011-04-23 DIAGNOSIS — Z5111 Encounter for antineoplastic chemotherapy: Secondary | ICD-10-CM

## 2011-04-23 LAB — CBC
Hemoglobin: 11.9 g/dL — ABNORMAL LOW (ref 12.0–15.0)
MCH: 31.6 pg (ref 26.0–34.0)
MCV: 90.2 fL (ref 78.0–100.0)
RBC: 3.77 MIL/uL — ABNORMAL LOW (ref 3.87–5.11)

## 2011-04-23 LAB — BASIC METABOLIC PANEL
Calcium: 9.6 mg/dL (ref 8.4–10.5)
GFR calc non Af Amer: >90 mL/min (ref >90)
Sodium: 135 mEq/L (ref 135–145)

## 2011-04-23 LAB — DIFFERENTIAL
Basophils Absolute: 0 10*3/uL (ref 0.0–0.1)
Basophils Relative: 0 % (ref 0–1)
Eosinophils Absolute: 0 10*3/uL (ref 0.0–0.7)
Eosinophils Relative: 0 % (ref 0–5)

## 2011-04-23 MED ORDER — SODIUM CHLORIDE 0.9 % IV SOLN
Freq: Once | INTRAVENOUS | Status: AC
  Administered 2011-04-23: 16 mg via INTRAVENOUS
  Filled 2011-04-23: qty 8

## 2011-04-23 MED ORDER — SODIUM CHLORIDE 0.9 % IV SOLN
Freq: Once | INTRAVENOUS | Status: AC
  Administered 2011-04-23: 10:00:00 via INTRAVENOUS

## 2011-04-23 MED ORDER — DEXAMETHASONE SODIUM PHOSPHATE 4 MG/ML IJ SOLN: 20.0000 mg | Freq: Once | INTRAMUSCULAR | Status: DC

## 2011-04-23 MED ORDER — SODIUM CHLORIDE 0.9 % IV SOLN
900.0000 mg | Freq: Once | INTRAVENOUS | Status: AC
  Administered 2011-04-23: 900 mg via INTRAVENOUS
  Filled 2011-04-23: qty 90

## 2011-04-23 MED ORDER — HEPARIN SOD (PORK) LOCK FLUSH 100 UNIT/ML IV SOLN
500.0000 [IU] | Freq: Once | INTRAVENOUS | Status: AC | PRN
  Administered 2011-04-23: 500 [IU]
  Filled 2011-04-23: qty 5

## 2011-04-23 MED ORDER — DOCETAXEL CHEMO INJECTION 160 MG/16ML
75.0000 mg/m2 | Freq: Once | INTRAVENOUS | Status: AC
  Administered 2011-04-23: 180 mg via INTRAVENOUS
  Filled 2011-04-23: qty 18

## 2011-04-23 MED ORDER — SODIUM CHLORIDE 0.9 % IV SOLN: 16.0000 mg | Freq: Once | INTRAVENOUS | Status: DC

## 2011-04-23 MED ORDER — HEPARIN SOD (PORK) LOCK FLUSH 100 UNIT/ML IV SOLN
INTRAVENOUS | Status: AC
  Filled 2011-04-23: qty 5

## 2011-04-23 NOTE — Progress Notes (Signed)
To tx area for labs from port.

## 2011-04-24 ENCOUNTER — Telehealth: Payer: Self-pay | Admitting: *Deleted

## 2011-04-24 ENCOUNTER — Encounter (HOSPITAL_BASED_OUTPATIENT_CLINIC_OR_DEPARTMENT_OTHER): Payer: BC Managed Care – PPO

## 2011-04-24 ENCOUNTER — Encounter (HOSPITAL_COMMUNITY): Payer: BC Managed Care – PPO

## 2011-04-24 VITALS — BP 129/76 | HR 74 | Temp 97.0°F

## 2011-04-24 DIAGNOSIS — Z5189 Encounter for other specified aftercare: Secondary | ICD-10-CM

## 2011-04-24 DIAGNOSIS — C50419 Malignant neoplasm of upper-outer quadrant of unspecified female breast: Secondary | ICD-10-CM

## 2011-04-24 DIAGNOSIS — C50911 Malignant neoplasm of unspecified site of right female breast: Secondary | ICD-10-CM

## 2011-04-24 MED ORDER — PEGFILGRASTIM INJECTION 6 MG/0.6ML
6.0000 mg | Freq: Once | SUBCUTANEOUS | Status: AC
  Administered 2011-04-24: 6 mg via SUBCUTANEOUS

## 2011-04-24 MED ORDER — PEGFILGRASTIM INJECTION 6 MG/0.6ML
SUBCUTANEOUS | Status: AC
  Administered 2011-04-24: 6 mg via SUBCUTANEOUS
  Filled 2011-04-24: qty 0.6

## 2011-04-24 NOTE — Progress Notes (Signed)
Kathleen Reynolds presents today for injection per MD orders. Neulasta 6mg  administered SQ in left Abdomen. Administration without incident. Patient tolerated well.

## 2011-04-24 NOTE — Telephone Encounter (Signed)
Patient returned my call.  Confirmed her for 05/07/11 genetics appt.  Gave paperwork to Clydie Braun.  Called [facility] w/ appt info.

## 2011-05-04 ENCOUNTER — Encounter (HOSPITAL_COMMUNITY): Payer: BC Managed Care – PPO | Attending: Oncology

## 2011-05-04 DIAGNOSIS — C50911 Malignant neoplasm of unspecified site of right female breast: Secondary | ICD-10-CM

## 2011-05-04 DIAGNOSIS — C50919 Malignant neoplasm of unspecified site of unspecified female breast: Secondary | ICD-10-CM

## 2011-05-04 DIAGNOSIS — B359 Dermatophytosis, unspecified: Secondary | ICD-10-CM | POA: Insufficient documentation

## 2011-05-04 LAB — CBC
Hemoglobin: 11.6 g/dL — ABNORMAL LOW (ref 12.0–15.0)
MCH: 31.2 pg (ref 26.0–34.0)
RBC: 3.72 MIL/uL — ABNORMAL LOW (ref 3.87–5.11)

## 2011-05-04 LAB — COMPREHENSIVE METABOLIC PANEL
Alkaline Phosphatase: 88 U/L (ref 39–117)
BUN: 12 mg/dL (ref 6–23)
CO2: 22 mEq/L (ref 19–32)
Chloride: 105 mEq/L (ref 96–112)
GFR calc Af Amer: >90 mL/min (ref >90)
Glucose, Bld: 118 mg/dL — ABNORMAL HIGH (ref 70–99)
Potassium: 3.8 mEq/L (ref 3.5–5.1)
Total Bilirubin: 0.2 mg/dL — ABNORMAL LOW (ref 0.3–1.2)

## 2011-05-04 LAB — DIFFERENTIAL
Lymphocytes Relative: 21 % (ref 12–46)
Lymphs Abs: 2.1 10*3/uL (ref 0.7–4.0)
Monocytes Relative: 7 % (ref 3–12)
Neutro Abs: 7.4 10*3/uL (ref 1.7–7.7)
Neutrophils Relative %: 72 % (ref 43–77)

## 2011-05-06 ENCOUNTER — Ambulatory Visit (HOSPITAL_COMMUNITY): Payer: BC Managed Care – PPO | Admitting: Oncology

## 2011-05-06 ENCOUNTER — Telehealth: Payer: Self-pay | Admitting: *Deleted

## 2011-05-06 NOTE — Telephone Encounter (Signed)
Left a voice mail message on home phone stating that Kathleen Reynolds was out tomorrow due to death in family and that we needed to reschedule.  Could not leave message on cell and she no longer works at the work number listed.

## 2011-05-07 ENCOUNTER — Encounter: Payer: BC Managed Care – PPO | Admitting: Genetic Counselor

## 2011-05-07 ENCOUNTER — Ambulatory Visit: Payer: BC Managed Care – PPO | Admitting: Family Medicine

## 2011-05-07 ENCOUNTER — Other Ambulatory Visit: Payer: BC Managed Care – PPO | Admitting: Lab

## 2011-05-08 ENCOUNTER — Encounter (HOSPITAL_BASED_OUTPATIENT_CLINIC_OR_DEPARTMENT_OTHER): Payer: BC Managed Care – PPO | Admitting: Oncology

## 2011-05-08 VITALS — BP 113/72 | HR 79 | Temp 98.5°F | Wt 286.5 lb

## 2011-05-08 DIAGNOSIS — C50919 Malignant neoplasm of unspecified site of unspecified female breast: Secondary | ICD-10-CM

## 2011-05-08 DIAGNOSIS — B359 Dermatophytosis, unspecified: Secondary | ICD-10-CM

## 2011-05-08 DIAGNOSIS — C50419 Malignant neoplasm of upper-outer quadrant of unspecified female breast: Secondary | ICD-10-CM

## 2011-05-08 MED ORDER — CLOTRIMAZOLE-BETAMETHASONE 1-0.05 % EX CREA: TOPICAL_CREAM | Freq: Two times a day (BID) | CUTANEOUS | Status: DC

## 2011-05-08 NOTE — Patient Instructions (Signed)
Kathleen Reynolds  [id number] [birth year]   [facility]  Discharge Instructions  RECOMMENDATIONS MADE BY THE CONSULTANT AND ANY TEST RESULTS WILL BE SENT TO YOUR REFERRING DOCTOR.   EXAM FINDINGS BY MD TODAY AND SIGNS AND SYMPTOMS TO REPORT TO CLINIC OR PRIMARY MD: you are doing ok with the chemotherapy.  You can use an over the counter allergy pill.  If you begin to cough up any thing that is green or yellow or if you run any fever let us know.  You can also use your lorazepam to help you rest at night.  MEDICATIONS PRESCRIBED: lotrisone cream to area on your forehead.   INSTRUCTIONS GIVEN AND DISCUSSED: Other :  Report any new lumps, bone pain or shortness of breath.  SPECIAL INSTRUCTIONS/FOLLOW-UP: Return to Clinic as scheduled.   I acknowledge that I have been informed and understand all the instructions given to me and received a copy. I do not have any more questions at this time, but understand that I may call the Specialty Clinic at [facility] at [phone] during business hours should I have any further questions or need assistance in obtaining follow-up care.    __________________________________________  _____________  __________ Signature of Patient or Authorized Representative            Date                   Time    __________________________________________ Nurse's Signature

## 2011-05-08 NOTE — Progress Notes (Signed)
Kathleen Overman, MD, MD [address], [address] [place] [address]  1. Tinea  clotrimazole-betamethasone (LOTRISONE) cream  2. Invasive ductal carcinoma of right breast, stage 1      CURRENT THERAPY:S/P 2 cycle of Taxotere and carboplatin started on 04/03/11 followed by neulasta support on day 2. This will be administered on an every 21 day interval Cycle III is scheduled for 05/14/2011.   INTERVAL HISTORY: Kathleen Reynolds 51 y.o. 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 Derrell Lolling on 02/13/2011, now followed by sentinel node biopsy, which was negative. Her cancer was ER negative, PR negative, Ki-67 marker high at 54%, HER2/neu was negative. She also has a history of left sided breast cancer.  The patient reports that chemotherapy has been causing her significant fatigue. She reports fatigue approximately 2 days following chemotherapy administration. It lasts for approximately 4 days.  She explains that she spent the majority of her time in bed during this period. She explains that family and friends provide her with food, therefore she gets out of bed to eat. Otherwise she lays and that the majority of her time for this four-day period.  Otherwise, the patient denies any nausea or vomiting. We spent some time discussing her fatigue. This is secondary to chemotherapy. She has already undergone previous treatment for a past breast cancer diagnosis. This is a second insult to her body and therefore I would expect her body to be slow to recover.  During discussion it's noted that the patient sounds as though she has nasal congestion. She reports approximately a few days ago she started having some watery eyes and nasal production that is clear. This is indicative of allergies. I've encouraged the patient to ascertain a generic allergy medication. She will do so. For her watery eyes, this  could be from chemotherapy but also recommended some Replens eyedrops for potential dry eyes. She denies any high burning or high pain. She denies any discharge from the orbit that has color.  She continues to have this hyperpigmented rash that her hair line. She reports it is much improved and I agree compared your last encounter. She was utilizing cocoa butter at home. Says it has not resolved, I would like to try some Lotrisone cream to this area. I question whether this is a candidate infection. She will apply the cream twice daily.  Isolated the patient that our goal is to get at least 4 cycles of chemotherapy. Ideally we will continue with 6 cycles of chemotherapy. We will see how her counts manage with subsequent chemotherapy.  ROS: No TIA's or unusual headaches, no dysphagia.  No prolonged cough. No dyspnea or chest pain on exertion.  No abdominal pain, change in bowel habits, black or bloody stools.  No urinary tract symptoms.  No new or unusual musculoskeletal symptoms.  Normal menses, no abnormal vaginal bleeding, discharge or unexpected pelvic pain. No new breast lumps, breast pain or nipple discharge.  Following her last cycle of chemotherapy, cycle 2, laboratory work was performed 10 days following administration. This was done to check her counts. Her counts remained very stable and no significant decreases in any cell lines were appreciated.  Past Medical History  Diagnosis Date  . Migraines   . Breast mass in female     right  . Arthritis   . Hyperlipidemia   . Breast cancer  Right  . Left rotator cuff tear   . Morbid obesity   . Anemia LIFELONG    HEAVY MENSES MAR 2012 HB 13 MCV 86.7 FERRITIN  66    has NEOPLASM, MALIGNANT, BREAST, LEFT; GOITER; DYSLIPIDEMIA; OBESITY, UNSPECIFIED; SHOULDER PAIN; HIP PAIN, RIGHT; TENDINITIS; RUPTURE ROTATOR CUFF; FATIGUE; HEADACHE; INGUINAL PAIN, RIGHT; NECK SPASM; Screening for colon cancer; History of iron deficiency anemia; Toe pain,  left; Insomnia; Neck muscle spasm; Prediabetes; Colonic adenoma; and Invasive ductal carcinoma of right breast, stage 1 on her problem list.     is allergic to ibuprofen.  Ms. Chiem had no medications administered during this visit.  Past Surgical History  Procedure Date  . Dilation and curettage of uterus   . Colonoscopy 2004 DR. SMITH    No polyp, hemorrhoids  . Colonoscopy 06/09/10    simple adenomas   . Hystersonogram 2009  . Dilation and curettage of uterus 05/2002  . Mastectomy 1999    left breast cancer   . Breast surgery 11/1997    Left breast cancer-mastectomy  . Breast biopsy 02/13/2011    Procedure: BREAST BIOPSY WITH NEEDLE LOCALIZATION;  Surgeon: Ernestene Mention, MD;  Location: [facility];  Service: General;  Laterality: Right;  . Breast surgery 2/13-    rt axilly bx  . Portacath placement 03/27/2011    Procedure: INSERTION PORT-A-CATH;  Surgeon: Ernestene Mention, MD;  Location: [facility];  Service: General;  Laterality: Left;  insert port a cath    Denies any headaches, dizziness, double vision, fevers, chills, night sweats, nausea, vomiting, diarrhea, constipation, chest pain, heart palpitations, shortness of breath, blood in stool, black tarry stool, urinary pain, urinary burning, urinary frequency, hematuria.   PHYSICAL EXAMINATION  ECOG PERFORMANCE STATUS: 2 - Symptomatic, <50% confined to bed  Filed Vitals:   05/08/11 1330  BP: 113/72  Pulse: 79  Temp: 98.5 F (36.9 C)    GENERAL:alert, no distress, well nourished, well developed, comfortable, cooperative, obese and smiling SKIN: skin color, texture, turgor are normal HEAD: Normocephalic, No masses, lesions, tenderness or abnormalities. Hyperpigmented area at hairline EYES: normal, Conjunctiva are pink and non-injected, no obvious drainage appreciated, but patient noted with tissue in hand clearing orbit clear drainage EARS: External ears normal OROPHARYNX:lips, buccal  mucosa, and tongue normal and mucous membranes are moist  NECK: supple, no adenopathy, thyroid normal size, non-tender, without nodularity, no stridor, non-tender, trachea midline LYMPH:  no palpable lymphadenopathy, no hepatosplenomegaly BREAST:right breast normal without mass, skin or nipple changes or axillary nodes, lumpectomy scar noted, left-sided post-mastectomy site well healed and free of suspicious changes LUNGS: clear to auscultation and percussion HEART: regular rate & rhythm, no murmurs, no gallops, S1 normal and S2 normal ABDOMEN:abdomen soft, non-tender, obese, normal bowel sounds, no masses or organomegaly, difficult to assess for hepatosplenomegaly due to body habitus and no hepatosplenomegaly BACK: Back symmetric, no curvature., No CVA tenderness EXTREMITIES:less then 2 second capillary refill, no joint deformities, effusion, or inflammation, no edema, no skin discoloration, no clubbing, no cyanosis  NEURO: alert & oriented x 3 with fluent speech, no focal motor/sensory deficits, gait normal    LABORATORY DATA: Results for KATEY, BARRIE (MRN [id number]) as of 05/08/2011 14:42  Ref. Range 04/23/2011 08:40 05/04/2011 09:06  WBC Latest Range: 4.0-10.5 K/uL 13.3 (H) 10.2  RBC Latest Range: 3.87-5.11 MIL/uL 3.77 (L) 3.72 (L)  HGB Latest Range: 12.0-15.0 g/dL 29.5 (L) 28.4 (L)  HCT Latest Range: 36.0-46.0 % 34.0 (L) 33.7 (L)  MCV  Latest Range: 78.0-100.0 fL 90.2 90.6  MCH Latest Range: 26.0-34.0 pg 31.6 31.2  MCHC Latest Range: 30.0-36.0 g/dL 16.1 09.6  RDW Latest Range: 11.5-15.5 % 15.3 16.1 (H)  Platelets Latest Range: 150-400 K/uL 220 156     PATHOLOGY: 02/13/2011  Diagnosis  1. Breast, lumpectomy, right partial  - INVASIVE GRADE III DUCTAL CARCINOMA, SPANNING 0.9 CM.  - LYMPH/VASCULAR INVASION NOT IDENTIFIED.  - INVASIVE CARCINOMA IS CLOSE TO POSTERIOR MARGIN (LESS THAN 0.1 CM).  - OTHER MARGINS ARE NEGATIVE.  - SEE ONCOLOGY TEMPLATE.  2. Breast, excision, right  deep margin  - BENIGN BREAST PARENCHYMA.  - NO IN SITU OR INVASIVE CARCINOMA IDENTIFIED.  3. Breast, excision, right superior  - BENIGN BREAST PARENCHYMA.  - NO IN SITU OR INVASIVE CARCINOMA IDENTIFIED.  4. Breast, excision, right inferior  - BENIGN BREAST PARENCHYMA.  - NO IN SITU OR INVASIVE CARCINOMA IDENTIFIED.  Microscopic Comment  1. BREAST, INVASIVE TUMOR, WITH LYMPH NODE SAMPLING  Specimen, including laterality: Right partial lumpectomy.  Procedure: Right partial lumpectomy.  Grade: III  Tubule formation: 3  Nuclear pleomorphism: 2  Mitotic:3  Tumor size (glass slide measurement and gross measurement): 0.9 cm.  Margins:  Invasive, distance to closest margin: Less than 0.1 cm away from posterior margin on original  specimen; right additional deep margin is negative (specimen #2).  Lymphovascular invasion: Not identified.  Ductal carcinoma in situ: No.  Lobular neoplasia: No.  Tumor focality: Unifocal  1 of 3  FINAL for DEIJAH, SPIKES ([id number])  Microscopic Comment(continued)  Treatment effect: N/A  Extent of tumor: Tumor confined to breast parenchyma.  Breast prognostic profile: Will be performed on current case.  Non-neoplastic breast: Unremarkable.  TNM: pT1b, pNX, MX.  Comments: Dr. Colonel Bald has seen the margin in consultation with agreement of the margin status. (RAH:gt,  02/16/11)  Zandra Abts MD  Pathologist, Electronic Signature  (Case signed 02/16/2011)  1. PROGNOSTIC INDICATORS - ACIS  Results  IMMUNOHISTOCHEMICAL AND MORPHOMETRIC ANALYSIS BY THE AUTOMATED CELLULAR  IMAGING SYSTEM (ACIS)  This carcinoma shows the following breast prognostic profile.  Estrogen Receptor (Negative, <1%): 0%, NEGATIVE  Progesterone Receptor (Negative, <1%): 0%, NEGATIVE  Proliferation Marker Ki67 by M IB-1 (Low<20%): 54%  COMMENT: The negative hormone receptor study(ies) in this case have an internal positive control.  All controls stained appropriately  Abigail Miyamoto MD  Pathologist, Electronic Signature  ( Signed 02/20/2011)  1. CHROMOGENIC IN-SITU HYBRIDIZATION  Interpretation  HER-2/NEU BY CISH - NO AMPLIFICATION OF HER-2 DETECTED. THE RATIO OF HER-2: CEP 17  SIGNALS WAS 1.23.  Reference range:  Ratio: HER2:CEP17 < 1.8 - gene amplification not observed  Ratio: HER2:CEP 17 1.8-2.2 - equivocal result  Ratio: HER2:CEP17 > 2.2 - gene amplification observed  Abigail Miyamoto MD  Pathologist, Electronic Signature  ( Signed 02/19/2011)  03/17/2011  Diagnosis  1. Lymph node, sentinel, biopsy, Right axillary  - ONE BENIGN LYMPH NODE WITH NO TUMOR SEEN (0/1).  2. Lymph node, sentinel, biopsy, Right axillary  - ONE BENIGN LYMPH NODE WITH NO TUMOR SEEN (0/1).  Zandra Abts MD  Pathologist, Electronic Signature  (Case signed 03/18/2011)    ASSESSMENT:  1. 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 Derrell Lolling on 02/13/2011, now followed by sentinel node biopsy, which was negative, done just the other day. Her cancer was ER negative, PR negative, Ki-67 marker high at 54%, HER2/neu was negative.  2. History of left-sided breast cancer in October  1999, status post lumpectomy initially, followed by mastectomy because of positive margins. This was a triple-negative cancer, as well. Seven nodes were negative. It was grade 3, 2.3 cm in size, with a high mitotic index, and I treated her with AC x 4 cycles, followed by Taxol x 4 cycles, completed as of 06/05/1998. Ki-67 marker was 50% at that time.  3. BRCA1 positivity in her family; her aunt, who is my patient, and her cousin are both BRCA1 positive, and she has now agreed to be seen by the genetics clinic for consultation, though she probably will not be able to afford it; so hopefully, they will be able to fund it.  4. Iron-deficiency anemia in the past.  5. History of cessation of menses in December 2010.  6. Spontaneous miscarriage in 2005.  7. D and C  in October 2008.  8. Negative colonoscopy 2003. 9. Fatigue for 4 days, secondary to chemotherapy. 10. ?Seasonal Allergies  PLAN:  1. I personally reviewed and went over laboratory results with the patient. 2. Move on with cycle III of chemotherapy as scheduled. 3. Recommended OTC allergy medication. 4. May try Replens for potential dry eyes 5. Rx e-scribed to [facility] for Lotrisone cream for rash as hairline.  Apply BID. 6. Discussed future chemotherapy.  Will administer a minimum of 4 cycles with the goal of 6 cycles.  Patient understands this plan 7. Discussion about fatigue secondary to chemotherapy.  This is to be expected, particularly with past history of chemotherapy and treatment for previous breast cancer. 8. Patient is to contact the clinic with any yellow/green sputum production. 9. Pre-chemo lab work ordered as standing orders 10. Return prior to cycle 4 for follow-up.   All questions were answered. The patient knows to call the clinic with any problems, questions or concerns. We can certainly see the patient much sooner if necessary.  The patient and plan discussed with Glenford Peers, MD and he is in agreement with the aforementioned.   Eryc Bodey

## 2011-05-14 ENCOUNTER — Encounter (HOSPITAL_BASED_OUTPATIENT_CLINIC_OR_DEPARTMENT_OTHER): Payer: BC Managed Care – PPO

## 2011-05-14 ENCOUNTER — Other Ambulatory Visit (HOSPITAL_COMMUNITY): Payer: BC Managed Care – PPO

## 2011-05-14 DIAGNOSIS — C50911 Malignant neoplasm of unspecified site of right female breast: Secondary | ICD-10-CM

## 2011-05-14 DIAGNOSIS — Z5111 Encounter for antineoplastic chemotherapy: Secondary | ICD-10-CM

## 2011-05-14 DIAGNOSIS — C50919 Malignant neoplasm of unspecified site of unspecified female breast: Secondary | ICD-10-CM

## 2011-05-14 DIAGNOSIS — C50419 Malignant neoplasm of upper-outer quadrant of unspecified female breast: Secondary | ICD-10-CM

## 2011-05-14 LAB — CBC
MCHC: 34.2 g/dL (ref 30.0–36.0)
Platelets: 222 10*3/uL (ref 150–400)
RDW: 17.1 % — ABNORMAL HIGH (ref 11.5–15.5)
WBC: 7.2 10*3/uL (ref 4.0–10.5)

## 2011-05-14 LAB — COMPREHENSIVE METABOLIC PANEL
ALT: 29 U/L (ref 0–35)
AST: 17 U/L (ref 0–37)
Albumin: 4 g/dL (ref 3.5–5.2)
CO2: 22 mEq/L (ref 19–32)
Calcium: 9.8 mg/dL (ref 8.4–10.5)
GFR calc non Af Amer: >90 mL/min (ref >90)
Sodium: 138 mEq/L (ref 135–145)

## 2011-05-14 LAB — DIFFERENTIAL
Basophils Absolute: 0 10*3/uL (ref 0.0–0.1)
Basophils Relative: 0 % (ref 0–1)
Eosinophils Relative: 0 % (ref 0–5)
Lymphocytes Relative: 17 % (ref 12–46)
Neutro Abs: 5.6 10*3/uL (ref 1.7–7.7)

## 2011-05-14 MED ORDER — DOCETAXEL CHEMO INJECTION 160 MG/16ML
75.0000 mg/m2 | Freq: Once | INTRAVENOUS | Status: AC
  Administered 2011-05-14: 180 mg via INTRAVENOUS
  Filled 2011-05-14: qty 18

## 2011-05-14 MED ORDER — SODIUM CHLORIDE 0.9 % IV SOLN
900.0000 mg | Freq: Once | INTRAVENOUS | Status: AC
  Administered 2011-05-14: 900 mg via INTRAVENOUS
  Filled 2011-05-14: qty 90

## 2011-05-14 MED ORDER — SODIUM CHLORIDE 0.9 % IV SOLN: 16.0000 mg | Freq: Once | INTRAVENOUS | Status: DC

## 2011-05-14 MED ORDER — ONDANSETRON HCL 4 MG/2ML IJ SOLN
Freq: Once | INTRAMUSCULAR | Status: AC
  Administered 2011-05-14: 16 mg via INTRAVENOUS
  Filled 2011-05-14: qty 8

## 2011-05-14 MED ORDER — SODIUM CHLORIDE 0.9 % IV SOLN
Freq: Once | INTRAVENOUS | Status: AC
  Administered 2011-05-14: 09:00:00 via INTRAVENOUS

## 2011-05-14 MED ORDER — HEPARIN SOD (PORK) LOCK FLUSH 100 UNIT/ML IV SOLN
500.0000 [IU] | Freq: Once | INTRAVENOUS | Status: AC | PRN
  Administered 2011-05-14: 500 [IU]
  Filled 2011-05-14: qty 5

## 2011-05-14 MED ORDER — DEXAMETHASONE SODIUM PHOSPHATE 4 MG/ML IJ SOLN: 20.0000 mg | Freq: Once | INTRAMUSCULAR | Status: DC

## 2011-05-14 MED ORDER — SODIUM CHLORIDE 0.9 % IJ SOLN
10.0000 mL | INTRAMUSCULAR | Status: DC | PRN
  Filled 2011-05-14: qty 10

## 2011-05-15 ENCOUNTER — Encounter (HOSPITAL_BASED_OUTPATIENT_CLINIC_OR_DEPARTMENT_OTHER): Payer: BC Managed Care – PPO

## 2011-05-15 VITALS — BP 97/64 | HR 88 | Temp 97.9°F

## 2011-05-15 DIAGNOSIS — C50911 Malignant neoplasm of unspecified site of right female breast: Secondary | ICD-10-CM

## 2011-05-15 DIAGNOSIS — Z5189 Encounter for other specified aftercare: Secondary | ICD-10-CM

## 2011-05-15 DIAGNOSIS — C50419 Malignant neoplasm of upper-outer quadrant of unspecified female breast: Secondary | ICD-10-CM

## 2011-05-15 MED ORDER — PEGFILGRASTIM INJECTION 6 MG/0.6ML
6.0000 mg | Freq: Once | SUBCUTANEOUS | Status: AC
  Administered 2011-05-15: 6 mg via SUBCUTANEOUS

## 2011-05-15 MED ORDER — PEGFILGRASTIM INJECTION 6 MG/0.6ML
SUBCUTANEOUS | Status: AC
  Filled 2011-05-15: qty 0.6

## 2011-05-15 NOTE — Progress Notes (Signed)
Kathleen Reynolds presents today for injection per MD orders. Neulasta 6mg administered SQ in left Abdomen. Administration without incident. Patient tolerated well.  

## 2011-05-25 NOTE — Progress Notes (Signed)
Labs drawn

## 2011-05-28 ENCOUNTER — Ambulatory Visit (HOSPITAL_BASED_OUTPATIENT_CLINIC_OR_DEPARTMENT_OTHER): Payer: BC Managed Care – PPO | Admitting: Genetic Counselor

## 2011-05-28 ENCOUNTER — Ambulatory Visit: Payer: BC Managed Care – PPO | Admitting: Lab

## 2011-05-28 DIAGNOSIS — C50919 Malignant neoplasm of unspecified site of unspecified female breast: Secondary | ICD-10-CM

## 2011-05-28 DIAGNOSIS — C50419 Malignant neoplasm of upper-outer quadrant of unspecified female breast: Secondary | ICD-10-CM

## 2011-05-28 DIAGNOSIS — IMO0002 Reserved for concepts with insufficient information to code with codable children: Secondary | ICD-10-CM

## 2011-05-28 DIAGNOSIS — Z808 Family history of malignant neoplasm of other organs or systems: Secondary | ICD-10-CM

## 2011-05-28 DIAGNOSIS — Z803 Family history of malignant neoplasm of breast: Secondary | ICD-10-CM

## 2011-05-28 NOTE — Progress Notes (Signed)
Dr. Mariel Sleet requested a consultation for genetic counseling and risk assessment for Kathleen Reynolds, a 51 y.o. female, for discussion of her personal history of triple negative bilateral breast cancer. She presents to clinic today to discuss the possibility of a genetic predisposition to cancer, and to further clarify her risks, as well as her family members' risks for cancer.   HISTORY OF PRESENT ILLNESS: In 1999, at the age of 38, Kathleen Reynolds was diagnosed with triple negative breast cancer. This was treated with chemotherapy and a mastectomy.  Earlier in 2013, Kathleen Reynolds was again diagnosed with triple negative breast cancer at the age of 35.  She will undergo chemotherapy and radiation.    Past Medical History  Diagnosis Date  . Migraines   . Breast mass in female     right  . Arthritis   . Hyperlipidemia   . Breast cancer     Right  . Left rotator cuff tear   . Morbid obesity   . Anemia LIFELONG    HEAVY MENSES MAR 2012 HB 13 MCV 86.7 FERRITIN  66    Past Surgical History  Procedure Date  . Dilation and curettage of uterus   . Colonoscopy 2004 DR. SMITH    No polyp, hemorrhoids  . Colonoscopy 06/09/10    simple adenomas   . Hystersonogram 2009  . Dilation and curettage of uterus 05/2002  . Mastectomy 1999    left breast cancer   . Breast surgery 11/1997    Left breast cancer-mastectomy  . Breast biopsy 02/13/2011    Procedure: BREAST BIOPSY WITH NEEDLE LOCALIZATION;  Surgeon: Ernestene Mention, MD;  Location: [facility];  Service: General;  Laterality: Right;  . Breast surgery 2/13-    rt axilly bx  . Portacath placement 03/27/2011    Procedure: INSERTION PORT-A-CATH;  Surgeon: Ernestene Mention, MD;  Location: [facility];  Service: General;  Laterality: Left;  insert port a cath    History  Substance Use Topics  . Smoking status: Never Smoker   . Smokeless tobacco: Never Used  . Alcohol Use: 1.0 oz/week    2 drink(s) per  week     occasional    REPRODUCTIVE HISTORY AND PERSONAL RISK ASSESSMENT FACTORS: Menopause at age 32 Uterus Intact: Yes Ovaries Intact: Yes G3P1A2 , first live birth at age 70  Her maternal cousin has tested positive for a BRCA1 mutation.   FAMILY HISTORY:  We obtained a detailed, 4-generation family history.  Significant diagnoses are listed below: Family History  Problem Relation Age of Onset  . Hypertension Mother   . Cancer Father     lung cancer  . Diabetes Sister   . Cancer Brother     colon cancer  . Heart disease Sister     heart attack  The patient was diagnosed with triple negative breast cancer at ages at 63 and 81.  She has a brother diagnosed with colon cancer at age 66 and a sister who was diagnosed with colon cancer in her 6s and died at 48.  The patient has two maternal aunts with breast cancer.  One aunt has three daughters with breast cancer, one of whom also has ovarian cancer.  The patient's grandmother was diagnosed with colon cancer and died at 12 and the grandmother's sister was diagnosed with breast cancer two times.  The patient's father was diagnosed with lung cancer and died at age 22.  He was a smoker.  Patient's maternal  ancestors are of [place] descent, and paternal ancestors are of African American descent. There is no reported Ashkenazi Jewish ancestry. There is no  known consanguinity.  GENETIC COUNSELING RISK ASSESSMENT, DISCUSSION, AND SUGGESTED FOLLOW UP: We reviewed the natural history and genetic etiology of sporadic, familial and hereditary cancer syndromes.  About 5-10% of breast cancer is hereditary; of this about 85% is the result of a BRCA1 or BRCA2 mutation.  The patient's maternal cousin was diagnosed with a BRCA1 mutation.  This individual is the only person in the family who has been tested.  Therefore, theoretically, she has a 25% chance of having a BRCA mutation.  We discussed that because she has bilateral  triple negative breast cancer, that we suspect she has a mutation as well.  The patient's bilateral triple negative breast cancer is suggestive of the following possible diagnosis: hereditary breast and ovarian cancer syndrome  We discussed that identification of a hereditary cancer syndrome may help her care providers tailor the patients medical management. If a mutation indicating hereditary breast and ovarian cancer syndrome is detected in this case, the [employer] recommendations would include increased cancer surveillance and possible prophylactic sugery. If a mutation is detected, the patient will be referred back to the referring provider and to any additional appropriate care providers to discuss the relevant options.   If a mutation is not found in the patient, this will decrease the likelihood of hereditary breast and ovarian cancer syndrome as the explanation for her breast cancer. Cancer surveillance options would be discussed for the patient according to the appropriate standard National Comprehensive Cancer Network and American Cancer Society guidelines, with consideration of their personal and family history risk factors. In this case, the patient will be referred back to their care providers for discussions of management.   After considering the risks, benefits, and limitations, the patient provided informed consent for  the following  Testing: single site BRCA testing through [facility].   Per the patient's request, we will contact her by telephone to discuss these results. A follow up genetic counseling visit will be scheduled if indicated.  The patient was seen for a total of 55 minutes, greater than 50% of which was spent face-to-face counseling.  This plan is being carried out per Dr. Thornton Papas recommendations.  This note will also be sent to the referring provider via the electronic medical record. The patient will be supplied with a  summary of this genetic counseling discussion as well as educational information on the discussed hereditary cancer syndromes following the conclusion of their visit.   Patient was discussed with Dr. Drue Second.  EDUCATIONAL INFORMATION SUPPLIED TO PATIENT AT ENCOUNTER:  Hereditary breast and ovarian cancer syndrome brochure   _______________________________________________________________________ For Office Staff:  Number of people involved in session: 3 Was an Intern/ student involved with case: not applicable

## 2011-06-01 ENCOUNTER — Encounter (HOSPITAL_COMMUNITY): Payer: Self-pay | Admitting: Oncology

## 2011-06-01 ENCOUNTER — Encounter (HOSPITAL_BASED_OUTPATIENT_CLINIC_OR_DEPARTMENT_OTHER): Payer: BC Managed Care – PPO | Admitting: Oncology

## 2011-06-01 VITALS — BP 97/66 | HR 79 | Temp 97.4°F | Wt 286.8 lb

## 2011-06-01 DIAGNOSIS — Z1501 Genetic susceptibility to malignant neoplasm of breast: Secondary | ICD-10-CM

## 2011-06-01 DIAGNOSIS — C50919 Malignant neoplasm of unspecified site of unspecified female breast: Secondary | ICD-10-CM

## 2011-06-01 DIAGNOSIS — C50419 Malignant neoplasm of upper-outer quadrant of unspecified female breast: Secondary | ICD-10-CM

## 2011-06-01 DIAGNOSIS — Z853 Personal history of malignant neoplasm of breast: Secondary | ICD-10-CM

## 2011-06-01 NOTE — Progress Notes (Signed)
Kathleen Overman, MD, MD [address] [place] [address]  1. NEOPLASM, MALIGNANT, BREAST, LEFT  CBC, Differential, Comprehensive metabolic panel, CBC, Differential, Comprehensive metabolic panel, CBC, Differential, Comprehensive metabolic panel  2. Invasive ductal carcinoma of right breast, stage 1  CBC, Differential, Comprehensive metabolic panel, CBC, Differential, Comprehensive metabolic panel, CBC, Differential, Comprehensive metabolic panel    CURRENT THERAPY:S/P 3 cycle of Taxotere and carboplatin started on 04/03/11 followed by neulasta support on day 2. This will be administered on an every 21 day interval Cycle IV is scheduled for 06/04/2011.   INTERVAL HISTORY: Kathleen Reynolds 51 y.o. 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 Derrell Lolling on 02/13/2011, now followed by sentinel node biopsy, which was negative. Her cancer was ER negative, PR negative, Ki-67 marker high at 54%, HER2/neu was negative. She also has a history of left sided breast cancer.  Sadly, the patient's close cousin passed away recently at the young age of 75 from MI.  She of course is saddened, but is handling it well.  We spoke about the patient's son.  He is 51 years old and unfortunately, last year, became ill and lost his job.  He is now well again and looking for employment.  He lives with her.  He is 51 years old. Unfortunately, he dropped out of college a few years ago because he got involved with a girl.  He does not have a degree but he is considering going back to school to ascertain a degree.  The patient is doing very well.  She tolerated the therapy without any nausea or vomiting.  Her only side effect is fatigue lasting a number of days.  It is no worse than previous cycles.  The fatigue has remained stable over the course of her treatment.   We will push on with the 4th cycle and  hopefully reach our goal of 6 treatments.  Past Medical History  Diagnosis Date  . Migraines   . Breast mass in female     right  . Arthritis   . Hyperlipidemia   . Breast cancer     Right  . Left rotator cuff tear   . Morbid obesity   . Anemia LIFELONG    HEAVY MENSES MAR 2012 HB 13 MCV 86.7 FERRITIN  66    has NEOPLASM, MALIGNANT, BREAST, LEFT; GOITER; DYSLIPIDEMIA; OBESITY, UNSPECIFIED; SHOULDER PAIN; HIP PAIN, RIGHT; TENDINITIS; RUPTURE ROTATOR CUFF; FATIGUE; HEADACHE; INGUINAL PAIN, RIGHT; NECK SPASM; Screening for colon cancer; History of iron deficiency anemia; Toe pain, left; Insomnia; Neck muscle spasm; Prediabetes; Colonic adenoma; and Invasive ductal carcinoma of right breast, stage 1 on her problem list.     is allergic to ibuprofen.  Ms. Carelli had no medications administered during this visit.  Past Surgical History  Procedure Date  . Dilation and curettage of uterus   . Colonoscopy 2004 DR. SMITH    No polyp, hemorrhoids  . Colonoscopy 06/09/10    simple adenomas   . Hystersonogram 2009  . Dilation and curettage of uterus 05/2002  . Mastectomy 1999    left breast cancer   . Breast surgery 11/1997    Left breast cancer-mastectomy  . Breast biopsy 02/13/2011    Procedure: BREAST BIOPSY WITH NEEDLE LOCALIZATION;  Surgeon: Ernestene Mention, MD;  Location: [facility];  Service: General;  Laterality: Right;  . Breast surgery  2/13-    rt axilly bx  . Portacath placement 03/27/2011    Procedure: INSERTION PORT-A-CATH;  Surgeon: Ernestene Mention, MD;  Location: [facility];  Service: General;  Laterality: Left;  insert port a cath    Denies any headaches, dizziness, double vision, fevers, chills, night sweats, nausea, vomiting, diarrhea, constipation, chest pain, heart palpitations, shortness of breath, blood in stool, black tarry stool, urinary pain, urinary burning, urinary frequency, hematuria.   PHYSICAL EXAMINATION  ECOG PERFORMANCE  STATUS: 1 - Symptomatic but completely ambulatory  Filed Vitals:   06/01/11 1505  BP: 97/66  Pulse: 79  Temp: 97.4 F (36.3 C)    GENERAL:alert, no distress, well nourished, well developed, comfortable, cooperative, obese and smiling SKIN: skin color, texture, turgor are normal, no rashes or significant lesions HEAD: Normocephalic, No masses, lesions, tenderness or abnormalities EYES: normal, EOMI, Conjunctiva are pink and non-injected EARS: External ears normal OROPHARYNX:lips, buccal mucosa, and tongue normal and mucous membranes are moist  NECK: supple, no adenopathy, no stridor, non-tender, trachea midline LYMPH:  no palpable lymphadenopathy BREAST:patient declines to have breast exam LUNGS: clear to auscultation  HEART: regular rate & rhythm, no murmurs, no gallops, S1 normal and S2 normal ABDOMEN:abdomen soft, non-tender and obese, normal bowel sounds BACK: Back symmetric, no curvature. EXTREMITIES:less then 2 second capillary refill, no joint deformities, effusion, or inflammation, no edema, no skin discoloration, no clubbing, no cyanosis  NEURO: alert & oriented x 3 with fluent speech, no focal motor/sensory deficits, gait normal   PATHOLOGY: 02/13/2011  Diagnosis  1. Breast, lumpectomy, right partial  - INVASIVE GRADE III DUCTAL CARCINOMA, SPANNING 0.9 CM.  - LYMPH/VASCULAR INVASION NOT IDENTIFIED.  - INVASIVE CARCINOMA IS CLOSE TO POSTERIOR MARGIN (LESS THAN 0.1 CM).  - OTHER MARGINS ARE NEGATIVE.  - SEE ONCOLOGY TEMPLATE.  2. Breast, excision, right deep margin  - BENIGN BREAST PARENCHYMA.  - NO IN SITU OR INVASIVE CARCINOMA IDENTIFIED.  3. Breast, excision, right superior  - BENIGN BREAST PARENCHYMA.  - NO IN SITU OR INVASIVE CARCINOMA IDENTIFIED.  4. Breast, excision, right inferior  - BENIGN BREAST PARENCHYMA.  - NO IN SITU OR INVASIVE CARCINOMA IDENTIFIED.  Microscopic Comment  1. BREAST, INVASIVE TUMOR, WITH LYMPH NODE SAMPLING  Specimen, including  laterality: Right partial lumpectomy.  Procedure: Right partial lumpectomy.  Grade: III  Tubule formation: 3  Nuclear pleomorphism: 2  Mitotic:3  Tumor size (glass slide measurement and gross measurement): 0.9 cm.  Margins:  Invasive, distance to closest margin: Less than 0.1 cm away from posterior margin on original  specimen; right additional deep margin is negative (specimen #2).  Lymphovascular invasion: Not identified.  Ductal carcinoma in situ: No.  Lobular neoplasia: No.  Tumor focality: Unifocal  1 of 3  FINAL for RAYGEN, LINQUIST ([id number])  Microscopic Comment(continued)  Treatment effect: N/A  Extent of tumor: Tumor confined to breast parenchyma.  Breast prognostic profile: Will be performed on current case.  Non-neoplastic breast: Unremarkable.  TNM: pT1b, pNX, MX.  Comments: Dr. Colonel Bald has seen the margin in consultation with agreement of the margin status. (RAH:gt,  02/16/11)  Zandra Abts MD  Pathologist, Electronic Signature  (Case signed 02/16/2011)  1. PROGNOSTIC INDICATORS - ACIS  Results  IMMUNOHISTOCHEMICAL AND MORPHOMETRIC ANALYSIS BY THE AUTOMATED CELLULAR  IMAGING SYSTEM (ACIS)  This carcinoma shows the following breast prognostic profile.  Estrogen Receptor (Negative, <1%): 0%, NEGATIVE  Progesterone Receptor (Negative, <1%): 0%, NEGATIVE  Proliferation Marker Ki67 by M IB-1 (Low<20%): 54%  COMMENT:  The negative hormone receptor study(ies) in this case have an internal positive control.  All controls stained appropriately  Abigail Miyamoto MD  Pathologist, Electronic Signature  ( Signed 02/20/2011)  1. CHROMOGENIC IN-SITU HYBRIDIZATION  Interpretation  HER-2/NEU BY CISH - NO AMPLIFICATION OF HER-2 DETECTED. THE RATIO OF HER-2: CEP 17  SIGNALS WAS 1.23.  Reference range:  Ratio: HER2:CEP17 < 1.8 - gene amplification not observed  Ratio: HER2:CEP 17 1.8-2.2 - equivocal result  Ratio: HER2:CEP17 > 2.2 - gene amplification observed  Abigail Miyamoto MD  Pathologist, Electronic Signature  ( Signed 02/19/2011)  03/17/2011  Diagnosis  1. Lymph node, sentinel, biopsy, Right axillary  - ONE BENIGN LYMPH NODE WITH NO TUMOR SEEN (0/1).  2. Lymph node, sentinel, biopsy, Right axillary  - ONE BENIGN LYMPH NODE WITH NO TUMOR SEEN (0/1).  Zandra Abts MD  Pathologist, Electronic Signature  (Case signed 03/18/2011)    ASSESSMENT:  1. 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 Derrell Lolling on 02/13/2011, now followed by sentinel node biopsy, which was negative, done just the other day. Her cancer was ER negative, PR negative, Ki-67 marker high at 54%, HER2/neu was negative.  2. History of left-sided breast cancer in October 1999, status post lumpectomy initially, followed by mastectomy because of positive margins. This was a triple-negative cancer, as well. Seven nodes were negative. It was grade 3, 2.3 cm in size, with a high mitotic index, and I treated her with AC x 4 cycles, followed by Taxol x 4 cycles, completed as of 06/05/1998. Ki-67 marker was 50% at that time.  3. BRCA1 positivity in her family; her aunt, who is my patient, and her cousin are both BRCA1 positive, and she has now agreed to be seen by the genetics clinic for consultation, though she probably will not be able to afford it; so hopefully, they will be able to fund it.  4. Iron-deficiency anemia in the past.  5. History of cessation of menses in December 2010.  6. Spontaneous miscarriage in 2005.  7. D and C in October 2008.  8. Negative colonoscopy 2003.  9. Fatigue for 4 days, secondary to chemotherapy.  10. ?Seasonal Allergies   PLAN:  1. S/P genetics testing.  Results pending. 2. Patient scheduled to undergo cycle 4 of chemotherapy on 06/04/2011. 3. Goal is to complete 6 full cycles of chemotherapy. 4. Pre-chemo lab work the day of therapy: CBC diff, CMET/BMET 5. Return in 3 weeks for follow-up.   All  questions were answered. The patient knows to call the clinic with any problems, questions or concerns. We can certainly see the patient much sooner if necessary.  Atilla Zollner

## 2011-06-01 NOTE — Patient Instructions (Signed)
Kathleen Reynolds  [id number] September 08, 1960 Dr. Glenford Peers [facility]  Discharge Instructions  RECOMMENDATIONS MADE BY THE CONSULTANT AND ANY TEST RESULTS WILL BE SENT TO YOUR REFERRING DOCTOR.   EXAM FINDINGS BY MD TODAY AND SIGNS AND SYMPTOMS TO REPORT TO CLINIC OR PRIMARY MD: exam and discussion per PA.  MEDICATIONS PRESCRIBED: none   INSTRUCTIONS GIVEN AND DISCUSSED: Other :  Report any uncontrolled nausea and vomiting, any new lumps, bone pain or shortness of breath.  SPECIAL INSTRUCTIONS/FOLLOW-UP: Lab work Needed with chemotherapy and Return to Clinic as scheduled for chemotherapy, to see PA in 3 weeks and to see Dr. Mariel Sleet in 6 weeks.   I acknowledge that I have been informed and understand all the instructions given to me and received a copy. I do not have any more questions at this time, but understand that I may call the Specialty Clinic at [facility] at [phone] during business hours should I have any further questions or need assistance in obtaining follow-up care.    __________________________________________  _____________  __________ Signature of Patient or Authorized Representative            Date                   Time    __________________________________________ Nurse's Signature

## 2011-06-04 ENCOUNTER — Encounter (HOSPITAL_COMMUNITY): Payer: BC Managed Care – PPO | Attending: Oncology

## 2011-06-04 VITALS — BP 120/75 | HR 96 | Temp 97.6°F | Wt 287.4 lb

## 2011-06-04 DIAGNOSIS — C50919 Malignant neoplasm of unspecified site of unspecified female breast: Secondary | ICD-10-CM | POA: Insufficient documentation

## 2011-06-04 DIAGNOSIS — C50419 Malignant neoplasm of upper-outer quadrant of unspecified female breast: Secondary | ICD-10-CM

## 2011-06-04 DIAGNOSIS — Z5111 Encounter for antineoplastic chemotherapy: Secondary | ICD-10-CM

## 2011-06-04 DIAGNOSIS — C50911 Malignant neoplasm of unspecified site of right female breast: Secondary | ICD-10-CM

## 2011-06-04 LAB — CBC
HCT: 33.3 % — ABNORMAL LOW (ref 36.0–46.0)
Hemoglobin: 11.6 g/dL — ABNORMAL LOW (ref 12.0–15.0)
MCH: 32.4 pg (ref 26.0–34.0)
MCHC: 34.8 g/dL (ref 30.0–36.0)
MCV: 93 fL (ref 78.0–100.0)
RDW: 19.1 % — ABNORMAL HIGH (ref 11.5–15.5)

## 2011-06-04 LAB — COMPREHENSIVE METABOLIC PANEL
AST: 25 U/L (ref 0–37)
Albumin: 3.8 g/dL (ref 3.5–5.2)
BUN: 14 mg/dL (ref 6–23)
Calcium: 9.6 mg/dL (ref 8.4–10.5)
Chloride: 101 mEq/L (ref 96–112)
Creatinine, Ser: 0.62 mg/dL (ref 0.50–1.10)
Total Bilirubin: 0.2 mg/dL — ABNORMAL LOW (ref 0.3–1.2)

## 2011-06-04 LAB — DIFFERENTIAL
Basophils Absolute: 0 10*3/uL (ref 0.0–0.1)
Basophils Relative: 0 % (ref 0–1)
Eosinophils Absolute: 0 10*3/uL (ref 0.0–0.7)
Eosinophils Relative: 0 % (ref 0–5)
Monocytes Absolute: 0.2 10*3/uL (ref 0.1–1.0)
Monocytes Relative: 3 % (ref 3–12)

## 2011-06-04 MED ORDER — HEPARIN SOD (PORK) LOCK FLUSH 100 UNIT/ML IV SOLN
500.0000 [IU] | Freq: Once | INTRAVENOUS | Status: AC | PRN
  Administered 2011-06-04: 500 [IU]
  Filled 2011-06-04: qty 5

## 2011-06-04 MED ORDER — SODIUM CHLORIDE 0.9 % IV SOLN
Freq: Once | INTRAVENOUS | Status: AC
  Administered 2011-06-04: 16 mg via INTRAVENOUS
  Filled 2011-06-04: qty 8

## 2011-06-04 MED ORDER — SODIUM CHLORIDE 0.9 % IV SOLN
900.0000 mg | Freq: Once | INTRAVENOUS | Status: AC
  Administered 2011-06-04: 900 mg via INTRAVENOUS
  Filled 2011-06-04: qty 90

## 2011-06-04 MED ORDER — HEPARIN SOD (PORK) LOCK FLUSH 100 UNIT/ML IV SOLN
INTRAVENOUS | Status: AC
  Filled 2011-06-04: qty 5

## 2011-06-04 MED ORDER — DOCETAXEL CHEMO INJECTION 160 MG/16ML
75.0000 mg/m2 | Freq: Once | INTRAVENOUS | Status: AC
  Administered 2011-06-04: 180 mg via INTRAVENOUS
  Filled 2011-06-04: qty 18

## 2011-06-04 MED ORDER — SODIUM CHLORIDE 0.9 % IV SOLN: 16.0000 mg | Freq: Once | INTRAVENOUS | Status: DC

## 2011-06-04 MED ORDER — DEXAMETHASONE SODIUM PHOSPHATE 4 MG/ML IJ SOLN: 20.0000 mg | Freq: Once | INTRAMUSCULAR | Status: DC

## 2011-06-04 MED ORDER — SODIUM CHLORIDE 0.9 % IV SOLN: Freq: Once | INTRAVENOUS | Status: DC

## 2011-06-04 MED ORDER — SODIUM CHLORIDE 0.9 % IJ SOLN
10.0000 mL | INTRAMUSCULAR | Status: DC | PRN
  Filled 2011-06-04: qty 10

## 2011-06-05 ENCOUNTER — Encounter (HOSPITAL_BASED_OUTPATIENT_CLINIC_OR_DEPARTMENT_OTHER): Payer: BC Managed Care – PPO

## 2011-06-05 VITALS — BP 142/81 | HR 84 | Temp 97.1°F

## 2011-06-05 DIAGNOSIS — C50911 Malignant neoplasm of unspecified site of right female breast: Secondary | ICD-10-CM

## 2011-06-05 DIAGNOSIS — C50419 Malignant neoplasm of upper-outer quadrant of unspecified female breast: Secondary | ICD-10-CM

## 2011-06-05 DIAGNOSIS — Z5189 Encounter for other specified aftercare: Secondary | ICD-10-CM

## 2011-06-05 MED ORDER — PEGFILGRASTIM INJECTION 6 MG/0.6ML
SUBCUTANEOUS | Status: AC
  Filled 2011-06-05: qty 0.6

## 2011-06-05 MED ORDER — PEGFILGRASTIM INJECTION 6 MG/0.6ML
6.0000 mg | Freq: Once | SUBCUTANEOUS | Status: AC
  Administered 2011-06-05: 6 mg via SUBCUTANEOUS

## 2011-06-05 NOTE — Progress Notes (Signed)
Kathleen Reynolds presents today for injection per MD orders. Neulasta 6mg  administered SQ in right Abdomen. Administration without incident. Patient tolerated well.

## 2011-06-22 ENCOUNTER — Ambulatory Visit (HOSPITAL_COMMUNITY): Payer: BC Managed Care – PPO | Admitting: Oncology

## 2011-06-22 ENCOUNTER — Encounter (HOSPITAL_BASED_OUTPATIENT_CLINIC_OR_DEPARTMENT_OTHER): Payer: BC Managed Care – PPO | Admitting: Oncology

## 2011-06-22 VITALS — BP 137/76 | HR 96 | Temp 97.7°F | Wt 288.6 lb

## 2011-06-22 DIAGNOSIS — G620 Drug-induced polyneuropathy: Secondary | ICD-10-CM

## 2011-06-22 DIAGNOSIS — R5381 Other malaise: Secondary | ICD-10-CM

## 2011-06-22 DIAGNOSIS — R5383 Other fatigue: Secondary | ICD-10-CM

## 2011-06-22 DIAGNOSIS — C50419 Malignant neoplasm of upper-outer quadrant of unspecified female breast: Secondary | ICD-10-CM

## 2011-06-22 DIAGNOSIS — Z171 Estrogen receptor negative status [ER-]: Secondary | ICD-10-CM

## 2011-06-22 DIAGNOSIS — C50919 Malignant neoplasm of unspecified site of unspecified female breast: Secondary | ICD-10-CM

## 2011-06-22 NOTE — Progress Notes (Signed)
Kathleen Overman, MD, MD [address] [place] [address]  1. Invasive ductal carcinoma of right breast, stage 1     CURRENT THERAPY:S/P 4 cycle of Taxotere and carboplatin started on 04/03/11 followed by neulasta support on day 2. This will be administered on an every 21 day interval Cycle V is scheduled for 06/25/2011.    INTERVAL HISTORY: Kathleen Reynolds 51 y.o. 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 Derrell Lolling on 02/13/2011, now followed by sentinel node biopsy, which was negative. Her cancer was ER negative, PR negative, Ki-67 marker high at 54%, HER2/neu was negative. She also has a history of left sided breast cancer.   Kathleen Reynolds reports that she is doing well. She does report continued fatigue on the second or third day of chemotherapy. She explains that on her most recent cycle of chemotherapy, she had less fatigue than she did on cycles 1 through 3. In the past, her fatigue lasted for approximately one week, but on this most recent cycle it only lasted for a few days. She slated she is very fatigued and "drained" following chemotherapy administration. All begin the second or third day following chemotherapy administration.  Patient also reports that she's having some neuropathy of the fingertips that is inhibiting her ability to button buttons and utilize a zipper. I tested this.  She was able to pick up a small coin from a flat surface with the help of her long fingernails. She is able to identify a paper clip within her hand with her eyes closed. She denies any difficulty with walking. She denies any falls.  Patient reports that she unfortunately lost her medical insurance do to her running out of her FMLA. She did apply for cone assistance and got approval for 100% of her care with the [facility] system. Unfortunately her medications are expensive for her.  Due to her peripheral neuropathy, I would like to start gabapentin. First we will pursue avenues to get this medication for her any discounted or free price.  I provided the patient with education regarding gabapentin. She understands he may make her drowsy and that's why it's recommended taken our sleep. This should help with her difficulty with sleeping.  The patient also provided me a form to fill out that may help her ascertain to help with gas money and light bill money.  I will fill his down she will pick up her care worker she comes in for chemotherapy on Thursday.  She notes some right toe nail color changes which is likely secondary chemotherapy. She is concerned that her right large toe has a fungal infection. There is no tenderness palpation of her toe nail or her toe.  No discharge appreciated on palpation. No pain appreciated.  Otherwise, the patient's doing very well. We will reduce her dose of Taxotere by 25% in light of her peripheral neuropathy. She will move on with her fifth cycle of chemotherapy as scheduled. We will give her gabapentin to help protect her nerves his much as possible.  We will go cycle by cycle and see if she does with her subsequent fifth and sixth cycle.     Past Medical History  Diagnosis Date  . Migraines   . Breast mass in female     right  . Arthritis   . Hyperlipidemia   . Breast cancer     Right  .  Left rotator cuff tear   . Morbid obesity   . Anemia LIFELONG    HEAVY MENSES MAR 2012 HB 13 MCV 86.7 FERRITIN  66    has NEOPLASM, MALIGNANT, BREAST, LEFT; GOITER; DYSLIPIDEMIA; OBESITY, UNSPECIFIED; SHOULDER PAIN; HIP PAIN, RIGHT; TENDINITIS; RUPTURE ROTATOR CUFF; FATIGUE; HEADACHE; INGUINAL PAIN, RIGHT; NECK SPASM; Screening for colon cancer; History of iron deficiency anemia; Toe pain, left; Insomnia; Neck muscle spasm; Prediabetes; Colonic adenoma; and Invasive ductal carcinoma of right breast, stage 1 on her problem list.     is allergic to  ibuprofen.  Ms. Worst had no medications administered during this visit.  Past Surgical History  Procedure Date  . Dilation and curettage of uterus   . Colonoscopy 2004 DR. SMITH    No polyp, hemorrhoids  . Colonoscopy 06/09/10    simple adenomas   . Hystersonogram 2009  . Dilation and curettage of uterus 05/2002  . Mastectomy 1999    left breast cancer   . Breast surgery 11/1997    Left breast cancer-mastectomy  . Breast biopsy 02/13/2011    Procedure: BREAST BIOPSY WITH NEEDLE LOCALIZATION;  Surgeon: Ernestene Mention, MD;  Location: [facility];  Service: General;  Laterality: Right;  . Breast surgery 2/13-    rt axilly bx  . Portacath placement 03/27/2011    Procedure: INSERTION PORT-A-CATH;  Surgeon: Ernestene Mention, MD;  Location: [facility];  Service: General;  Laterality: Left;  insert port a cath    Denies any headaches, dizziness, double vision, fevers, chills, night sweats, nausea, vomiting, diarrhea, constipation, chest pain, heart palpitations, shortness of breath, blood in stool, black tarry stool, urinary pain, urinary burning, urinary frequency, hematuria.   PHYSICAL EXAMINATION  ECOG PERFORMANCE STATUS: 2 - Symptomatic, <50% confined to bed  Filed Vitals:   06/22/11 1314  BP: 137/76  Pulse: 96  Temp: 97.7 F (36.5 C)    GENERAL:alert, no distress, well nourished, well developed, comfortable, cooperative, obese, smiling and alopecia SKIN: skin color, texture, turgor are normal, no rashes or significant lesions HEAD: Normocephalic, No masses, lesions, tenderness or abnormalities EYES: normal, Conjunctiva are pink and non-injected EARS: External ears normal OROPHARYNX:lips, buccal mucosa, and tongue normal and mucous membranes are moist  NECK: supple, no adenopathy, thyroid normal size, non-tender, without nodularity, no stridor, non-tender, trachea midline LYMPH:  no palpable lymphadenopathy BREAST:right breast normal without  mass, skin or nipple changes or axillary nodes, left post-mastectomy site well healed and free of suspicious changes LUNGS: clear to auscultation and percussion HEART: regular rate & rhythm, no murmurs, no gallops, S1 normal and S2 normal ABDOMEN:abdomen soft, non-tender, obese and normal bowel sounds BACK: Back symmetric, no curvature., No CVA tenderness EXTREMITIES:less then 2 second capillary refill, no joint deformities, effusion, or inflammation, no edema, no skin discoloration, no clubbing, no cyanosis, positive findings:  Right toenail color change appreciated likely secondary to chemotherapy  NEURO: alert & oriented x 3 with fluent speech, no focal motor/sensory deficits, gait normal   PATHOLOGY:  02/13/2011  Diagnosis  1. Breast, lumpectomy, right partial  - INVASIVE GRADE III DUCTAL CARCINOMA, SPANNING 0.9 CM.  - LYMPH/VASCULAR INVASION NOT IDENTIFIED.  - INVASIVE CARCINOMA IS CLOSE TO POSTERIOR MARGIN (LESS THAN 0.1 CM).  - OTHER MARGINS ARE NEGATIVE.  - SEE ONCOLOGY TEMPLATE.  2. Breast, excision, right deep margin  - BENIGN BREAST PARENCHYMA.  - NO IN SITU OR INVASIVE CARCINOMA IDENTIFIED.  3. Breast, excision, right superior  - BENIGN BREAST PARENCHYMA.  -  NO IN SITU OR INVASIVE CARCINOMA IDENTIFIED.  4. Breast, excision, right inferior  - BENIGN BREAST PARENCHYMA.  - NO IN SITU OR INVASIVE CARCINOMA IDENTIFIED.  Microscopic Comment  1. BREAST, INVASIVE TUMOR, WITH LYMPH NODE SAMPLING  Specimen, including laterality: Right partial lumpectomy.  Procedure: Right partial lumpectomy.  Grade: III  Tubule formation: 3  Nuclear pleomorphism: 2  Mitotic:3  Tumor size (glass slide measurement and gross measurement): 0.9 cm.  Margins:  Invasive, distance to closest margin: Less than 0.1 cm away from posterior margin on original  specimen; right additional deep margin is negative (specimen #2).  Lymphovascular invasion: Not identified.  Ductal carcinoma in situ: No.    Lobular neoplasia: No.  Tumor focality: Unifocal  1 of 3  FINAL for HANALEI, GLACE ([id number])  Microscopic Comment(continued)  Treatment effect: N/A  Extent of tumor: Tumor confined to breast parenchyma.  Breast prognostic profile: Will be performed on current case.  Non-neoplastic breast: Unremarkable.  TNM: pT1b, pNX, MX.  Comments: Dr. Colonel Bald has seen the margin in consultation with agreement of the margin status. (RAH:gt,  02/16/11)  Zandra Abts MD  Pathologist, Electronic Signature  (Case signed 02/16/2011)  1. PROGNOSTIC INDICATORS - ACIS  Results  IMMUNOHISTOCHEMICAL AND MORPHOMETRIC ANALYSIS BY THE AUTOMATED CELLULAR  IMAGING SYSTEM (ACIS)  This carcinoma shows the following breast prognostic profile.  Estrogen Receptor (Negative, <1%): 0%, NEGATIVE  Progesterone Receptor (Negative, <1%): 0%, NEGATIVE  Proliferation Marker Ki67 by M IB-1 (Low<20%): 54%  COMMENT: The negative hormone receptor study(ies) in this case have an internal positive control.  All controls stained appropriately  Abigail Miyamoto MD  Pathologist, Electronic Signature  ( Signed 02/20/2011)  1. CHROMOGENIC IN-SITU HYBRIDIZATION  Interpretation  HER-2/NEU BY CISH - NO AMPLIFICATION OF HER-2 DETECTED. THE RATIO OF HER-2: CEP 17  SIGNALS WAS 1.23.  Reference range:  Ratio: HER2:CEP17 < 1.8 - gene amplification not observed  Ratio: HER2:CEP 17 1.8-2.2 - equivocal result  Ratio: HER2:CEP17 > 2.2 - gene amplification observed  Abigail Miyamoto MD  Pathologist, Electronic Signature  ( Signed 02/19/2011)  03/17/2011  Diagnosis  1. Lymph node, sentinel, biopsy, Right axillary  - ONE BENIGN LYMPH NODE WITH NO TUMOR SEEN (0/1).  2. Lymph node, sentinel, biopsy, Right axillary  - ONE BENIGN LYMPH NODE WITH NO TUMOR SEEN (0/1).  Zandra Abts MD  Pathologist, Electronic Signature  (Case signed 03/18/2011)     ASSESSMENT:  1. 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 Derrell Lolling on 02/13/2011, now followed by sentinel node biopsy, which was negative, done just the other day. Her cancer was ER negative, PR negative, Ki-67 marker high at 54%, HER2/neu was negative.  2. History of left-sided breast cancer in October 1999, status post lumpectomy initially, followed by mastectomy because of positive margins. This was a triple-negative cancer, as well. Seven nodes were negative. It was grade 3, 2.3 cm in size, with a high mitotic index, and I treated her with AC x 4 cycles, followed by Taxol x 4 cycles, completed as of 06/05/1998. Ki-67 marker was 50% at that time.  3. BRCA1 positivity in her family; her aunt, who is my patient, and her cousin are both BRCA1 positive, and she has now agreed to be seen by the genetics clinic for consultation, though she probably will not be able to afford it; so hopefully, they will be able to fund it.  4. Iron-deficiency anemia in the past.  5. History  of cessation of menses in December 2010.  6. Spontaneous miscarriage in 2005.  7. D and C in October 2008.  8. Negative colonoscopy 2003.  9. Fatigue for 4 days, secondary to chemotherapy.  10. ?Seasonal Allergies  11. Peripheral neuropathy, mostly affecting fingers B/L. 12. Toenail changes secondary to chemotherapy.   PLAN:  1. Gabapentin 900 mg at HS. 2. Will ask April Manson to explore options to get #1 medication to the patient at a discounted price or free.  3. Patient has lost her medical insurance, but has gotten [facility] approval for 100%.  She will be applying for disability to hopefully attain insurance. 4. 25% dose reduction of Taxotere.  This is reflected in the treatment plan. 5. Patient will move on with cycle 5 of chemotherapy as planned. 6. The patient may soak her feet in warm water with baking soda since she is concerned about a fungal infection of her right large toe. 7. Return as scheduled following cycle 5 of  chemotherapy.  Patient asked me to fill out a form so she may get financial assistance for gas and bills.     All questions were answered. The patient knows to call the clinic with any problems, questions or concerns. We can certainly see the patient much sooner if necessary.  The patient and plan discussed with Glenford Peers, MD and he is in agreement with the aforementioned.  Anthonette Lesage

## 2011-06-22 NOTE — Patient Instructions (Signed)
Aya Geisel  [id number] [birth year] Dr. Glenford Peers   [facility]  Discharge Instructions  RECOMMENDATIONS MADE BY THE CONSULTANT AND ANY TEST RESULTS WILL BE SENT TO YOUR REFERRING DOCTOR.   EXAM FINDINGS BY MD TODAY AND SIGNS AND SYMPTOMS TO REPORT TO CLINIC OR PRIMARY MD: exam and discussion per PA.  Will start you on Gabapentin to see if it will help with your numbness and tingling.  MEDICATIONS PRESCRIBED: Gabapentin 300 mg take 1 at bedtime for 3 nights, then 2 at bedtime for 3 nights, then 3 at bedtime thereafter.  Will check with Angie to see if there is any assistance with the medication.   INSTRUCTIONS GIVEN AND DISCUSSED: Other :  Report any new lumps, bone pain, shortness of breath, increased numbness and tingling, uncontrolled nausea and vomiting.  SPECIAL INSTRUCTIONS/FOLLOW-UP:  As Scheduled    I acknowledge that I have been informed and understand all the instructions given to me and received a copy. I do not have any more questions at this time, but understand that I may call the Specialty Clinic at [facility] at [phone] during business hours should I have any further questions or need assistance in obtaining follow-up care.    __________________________________________  _____________  __________ Signature of Patient or Authorized Representative            Date                   Time    __________________________________________ Nurse's Signature

## 2011-06-25 ENCOUNTER — Encounter (HOSPITAL_BASED_OUTPATIENT_CLINIC_OR_DEPARTMENT_OTHER): Payer: BC Managed Care – PPO

## 2011-06-25 VITALS — BP 145/80 | HR 88 | Temp 97.5°F | Wt 286.6 lb

## 2011-06-25 DIAGNOSIS — C50919 Malignant neoplasm of unspecified site of unspecified female breast: Secondary | ICD-10-CM

## 2011-06-25 DIAGNOSIS — C50419 Malignant neoplasm of upper-outer quadrant of unspecified female breast: Secondary | ICD-10-CM

## 2011-06-25 DIAGNOSIS — Z5111 Encounter for antineoplastic chemotherapy: Secondary | ICD-10-CM

## 2011-06-25 LAB — DIFFERENTIAL
Basophils Absolute: 0 10*3/uL (ref 0.0–0.1)
Basophils Relative: 0 % (ref 0–1)
Eosinophils Absolute: 0 10*3/uL (ref 0.0–0.7)
Neutrophils Relative %: 84 % — ABNORMAL HIGH (ref 43–77)

## 2011-06-25 LAB — CBC
MCH: 33.2 pg (ref 26.0–34.0)
MCHC: 34.5 g/dL (ref 30.0–36.0)
Platelets: 201 10*3/uL (ref 150–400)

## 2011-06-25 LAB — COMPREHENSIVE METABOLIC PANEL
ALT: 19 U/L (ref 0–35)
AST: 21 U/L (ref 0–37)
Albumin: 3.7 g/dL (ref 3.5–5.2)
Alkaline Phosphatase: 60 U/L (ref 39–117)
Potassium: 3.8 mEq/L (ref 3.5–5.1)
Sodium: 137 mEq/L (ref 135–145)
Total Protein: 7.1 g/dL (ref 6.0–8.3)

## 2011-06-25 MED ORDER — SODIUM CHLORIDE 0.9 % IV SOLN
900.0000 mg | Freq: Once | INTRAVENOUS | Status: AC
  Administered 2011-06-25: 900 mg via INTRAVENOUS
  Filled 2011-06-25: qty 90

## 2011-06-25 MED ORDER — HEPARIN SOD (PORK) LOCK FLUSH 100 UNIT/ML IV SOLN
500.0000 [IU] | Freq: Once | INTRAVENOUS | Status: AC | PRN
  Administered 2011-06-25: 500 [IU]
  Filled 2011-06-25: qty 5

## 2011-06-25 MED ORDER — HEPARIN SOD (PORK) LOCK FLUSH 100 UNIT/ML IV SOLN
INTRAVENOUS | Status: AC
  Filled 2011-06-25: qty 5

## 2011-06-25 MED ORDER — DEXAMETHASONE SODIUM PHOSPHATE 4 MG/ML IJ SOLN: 20.0000 mg | Freq: Once | INTRAMUSCULAR | Status: DC

## 2011-06-25 MED ORDER — SODIUM CHLORIDE 0.9 % IV SOLN
Freq: Once | INTRAVENOUS | Status: AC
  Administered 2011-06-25: 16 mg via INTRAVENOUS
  Filled 2011-06-25: qty 8

## 2011-06-25 MED ORDER — SODIUM CHLORIDE 0.9 % IV SOLN
Freq: Once | INTRAVENOUS | Status: AC
  Administered 2011-06-25: 10:00:00 via INTRAVENOUS

## 2011-06-25 MED ORDER — DOCETAXEL CHEMO INJECTION 160 MG/16ML
56.2500 mg/m2 | Freq: Once | INTRAVENOUS | Status: AC
  Administered 2011-06-25: 140 mg via INTRAVENOUS
  Filled 2011-06-25: qty 14

## 2011-06-25 MED ORDER — SODIUM CHLORIDE 0.9 % IV SOLN: 16.0000 mg | Freq: Once | INTRAVENOUS | Status: DC

## 2011-06-26 ENCOUNTER — Encounter (HOSPITAL_BASED_OUTPATIENT_CLINIC_OR_DEPARTMENT_OTHER): Payer: BC Managed Care – PPO

## 2011-06-26 VITALS — BP 119/70 | HR 89 | Temp 97.8°F

## 2011-06-26 DIAGNOSIS — C50419 Malignant neoplasm of upper-outer quadrant of unspecified female breast: Secondary | ICD-10-CM

## 2011-06-26 DIAGNOSIS — Z5189 Encounter for other specified aftercare: Secondary | ICD-10-CM

## 2011-06-26 DIAGNOSIS — C50919 Malignant neoplasm of unspecified site of unspecified female breast: Secondary | ICD-10-CM

## 2011-06-26 MED ORDER — PEGFILGRASTIM INJECTION 6 MG/0.6ML
6.0000 mg | Freq: Once | SUBCUTANEOUS | Status: AC
  Administered 2011-06-26: 6 mg via SUBCUTANEOUS

## 2011-06-26 MED ORDER — PEGFILGRASTIM INJECTION 6 MG/0.6ML
SUBCUTANEOUS | Status: AC
  Filled 2011-06-26: qty 0.6

## 2011-06-26 NOTE — Progress Notes (Signed)
Kathleen Reynolds presents today for injection per MD orders. Neulasta 6mg  administered SQ in left Abdomen. Administration without incident. Patient tolerated well.

## 2011-07-01 ENCOUNTER — Telehealth: Payer: Self-pay | Admitting: Genetic Counselor

## 2011-07-01 ENCOUNTER — Encounter (INDEPENDENT_AMBULATORY_CARE_PROVIDER_SITE_OTHER): Payer: Self-pay | Admitting: General Surgery

## 2011-07-01 DIAGNOSIS — Z1501 Genetic susceptibility to malignant neoplasm of breast: Secondary | ICD-10-CM | POA: Insufficient documentation

## 2011-07-01 DIAGNOSIS — Z1509 Genetic susceptibility to other malignant neoplasm: Secondary | ICD-10-CM | POA: Insufficient documentation

## 2011-07-01 NOTE — Telephone Encounter (Signed)
Revealed BRCA1 mutation test results.  Patient is unsure if she is covered by her insurance for this repeat visit.  We'll contact patient accounts to see if she is covered.  She is scheduled to come in Jul 02, 2011 at 3 PM.

## 2011-07-02 ENCOUNTER — Ambulatory Visit (HOSPITAL_BASED_OUTPATIENT_CLINIC_OR_DEPARTMENT_OTHER): Payer: BC Managed Care – PPO | Admitting: Genetic Counselor

## 2011-07-02 ENCOUNTER — Other Ambulatory Visit: Payer: BC Managed Care – PPO | Admitting: Lab

## 2011-07-02 DIAGNOSIS — IMO0002 Reserved for concepts with insufficient information to code with codable children: Secondary | ICD-10-CM

## 2011-07-02 DIAGNOSIS — C50419 Malignant neoplasm of upper-outer quadrant of unspecified female breast: Secondary | ICD-10-CM

## 2011-07-02 DIAGNOSIS — Z1501 Genetic susceptibility to malignant neoplasm of breast: Secondary | ICD-10-CM

## 2011-07-02 DIAGNOSIS — Z1509 Genetic susceptibility to other malignant neoplasm: Secondary | ICD-10-CM

## 2011-07-03 ENCOUNTER — Encounter: Payer: Self-pay | Admitting: Genetic Counselor

## 2011-07-03 NOTE — Progress Notes (Signed)
Kathleen Reynolds, a 51 y.o. female, came in for discussion of her diagnosis of a BRCA1 mutation. She presents to clinic today to discuss her genetic predisposition to cancer, and to further clarify her risks, as well as her family members' risks for cancer.   HISTORY OF PRESENT ILLNESS: In May 2013, at the age of 36, Kathleen Reynolds was found to have a BRCA1 mutation.   Past Medical History  Diagnosis Date  . Migraines   . Breast mass in female     right  . Arthritis   . Hyperlipidemia   . Breast cancer     Right  . Left rotator cuff tear   . Morbid obesity   . Anemia LIFELONG    HEAVY MENSES MAR 2012 HB 13 MCV 86.7 FERRITIN  66    Past Surgical History  Procedure Date  . Dilation and curettage of uterus   . Colonoscopy 2004 DR. SMITH    No polyp, hemorrhoids  . Colonoscopy 06/09/10    simple adenomas   . Hystersonogram 2009  . Dilation and curettage of uterus 05/2002  . Mastectomy 1999    left breast cancer   . Breast surgery 11/1997    Left breast cancer-mastectomy  . Breast biopsy 02/13/2011    Procedure: BREAST BIOPSY WITH NEEDLE LOCALIZATION;  Surgeon: Ernestene Mention, MD;  Location: [facility];  Service: General;  Laterality: Right;  . Breast surgery 2/13-    rt axilly bx  . Portacath placement 03/27/2011    Procedure: INSERTION PORT-A-CATH;  Surgeon: Ernestene Mention, MD;  Location: [facility];  Service: General;  Laterality: Left;  insert port a cath    History  Substance Use Topics  . Smoking status: Never Smoker   . Smokeless tobacco: Never Used  . Alcohol Use: 1.0 oz/week    2 drink(s) per week     occasional    FAMILY HISTORY:  We obtained a detailed, 4-generation family history.  Significant diagnoses are listed below: Family History  Problem Relation Age of Onset  . Hypertension Mother   . Cancer Father     lung cancer  . Diabetes Sister   . Cancer Brother     colon cancer  . Heart disease Sister     heart  attack  The patient was diagnosed with triple negative breast cancer at ages at 81 and 75. She has a brother diagnosed with colon cancer at age 52 and a sister who was diagnosed with colon cancer in her 55s and died at 50. The patient has two maternal aunts with breast cancer. One aunt has three daughters with breast cancer, one of whom also has ovarian cancer. The patient's grandmother was diagnosed with colon cancer and died at 80 and the grandmother's sister was diagnosed with breast cancer two times.   The patient's father was diagnosed with lung cancer and died at age 24. He was a smoker.   Patient's maternal ancestors are of [place] descent, and paternal ancestors are of African American descent. There is no reported Ashkenazi Jewish ancestry. There is no known consanguinity.  GENETIC COUNSELING RISK ASSESSMENT, DISCUSSION, AND SUGGESTED FOLLOW UP: We performed genetic testing and found a mutation in the BRCA1 gene, which causes Hereditary Breast and Ovarian Cancer Syndrome (HBOC). We reviewed the screening recommendations and lifetime risks for each of the common cancers associated with HBOC. The patient still has her ovaries, so we reviewed the recommendation of oophorectomy to lower  the risk for ovarian cancer.  She may discuss this further with her own GYN, or make an appointment with the GYN oncologists at the [facility]. We discussed that all first degree relatives, both female and female, are at 50% risk for also having this mutation.  Therefore, they all should consider testing to determine if they too are in need of following the same recommendations for cancer screening. The relatives on the patient's maternal side of the family are all at risk and should be notified so they too could be tested. The patient indicated that she has discussed her test result with her siblings, but will give more information to them after her appointment.  The patient was given  multiple copies of her test result and the booklet provided by [facility] to describe the condition, to pass on to other family members.  The patient seems overwhelmed with this information, in addition to being treated for her cancer.  We discussed that there is no rush for the cancer screenings, and that she should continue with her current treatment.  Once this is complete, she can focus on other screening recommendations.  The patient was seen for a total of 30 minutes, greater than 50% of which was spent face-to-face counseling.  This plan is being carried out per Dr. Thornton Papas recommendations.  This note will also be sent to the referring provider via the electronic medical record. The patient will be supplied with a summary of this genetic counseling discussion as well as educational information on the discussed hereditary cancer syndromes following the conclusion of their visit.   Patient was discussed with Dr. Drue Second.  EPIC CC: Glenford Peers, MD Claud Kelp, MD Dr. Lorin Picket, [place], [place] Delton See, MD  CC BY [place] MAIL: [name], MD Internal Medicine, [place], [place]  _______________________________________________________________________ For Office Staff:  Number of people involved in session: 3 Was an Intern/ student involved with case: not applicable

## 2011-07-13 ENCOUNTER — Encounter (HOSPITAL_COMMUNITY): Payer: Medicaid Other | Attending: Oncology | Admitting: Oncology

## 2011-07-13 VITALS — BP 128/67 | HR 93 | Temp 97.8°F | Wt 290.4 lb

## 2011-07-13 DIAGNOSIS — Z171 Estrogen receptor negative status [ER-]: Secondary | ICD-10-CM

## 2011-07-13 DIAGNOSIS — C50919 Malignant neoplasm of unspecified site of unspecified female breast: Secondary | ICD-10-CM | POA: Insufficient documentation

## 2011-07-13 DIAGNOSIS — C50419 Malignant neoplasm of upper-outer quadrant of unspecified female breast: Secondary | ICD-10-CM

## 2011-07-13 DIAGNOSIS — G609 Hereditary and idiopathic neuropathy, unspecified: Secondary | ICD-10-CM

## 2011-07-13 NOTE — Progress Notes (Signed)
[name], MD, MD [address] [place] [address]  1. Invasive ductal carcinoma of right breast, stage 1     CURRENT THERAPY:S/P 5 cycle of Taxotere and carboplatin started on 04/03/11 followed by neulasta support on day 2. This will be administered on an every 21 day interval Cycle VI is scheduled for 07/16/2011.   INTERVAL HISTORY: Janneth Krasner 51 y.o. 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 Derrell Lolling on 02/13/2011, now followed by sentinel node biopsy, which was negative. Her cancer was ER negative, PR negative, Ki-67 marker high at 54%, HER2/neu was negative. She also has a history of left sided breast cancer.   Ms. Flaming is S/P 5 cycles of chemotherapy.  She is tolerating it very well.  Her counts remain stable and she has not required any dose reductions.  She is not experiencing any nausea or vomiting.  She is noted to have some fatigue and deconditioning.  She reports some dyspnea on exertion and reports that she cannot tolerate activities that she could be fore the chemotherapy due to fatigue.  She does not report any chest pain and her shortness of breath is not significant.  Her fatigue is likely from deconditioning and her obesity.  She denies any sudden onset of SOB.  She does report peripheral neuropathy, grade 2, only encompassing the finger tips.  She is able to manipulate small objects such as pages of a book, but she reports not as effectively as before chemotherapy.    She is noted to be BRCA1 positive.  She questions whether she will require radiation if she undergoes a mastectomy.  I reported that she will, but I called Dr. Dayton Scrape (Dr. Mitzi Hansen is out of the office) and he suggests that she pose that question to Dr. Mitzi Hansen.    She is due for her final cycle on 07/16/2011.  I have sent a communication to our scheduler to have the patient  scheduled for a radiation oncology appointment 1-2 weeks following the completion of chemotherapy.   She is considering a mastectomy of her right breast and an oophorectomy in light of her positive BRCA 1 testing.   Otherwise, the patient is doing well.  She reports B/L LE edema the other day that has resolved with LE elevation.  She reports the right was greater than the left.     Past Medical History  Diagnosis Date  . Migraines   . Breast mass in female     right  . Arthritis   . Hyperlipidemia   . Breast cancer     Right  . Left rotator cuff tear   . Morbid obesity   . Anemia LIFELONG    HEAVY MENSES MAR 2012 HB 13 MCV 86.7 FERRITIN  66    has NEOPLASM, MALIGNANT, BREAST, LEFT; GOITER; DYSLIPIDEMIA; OBESITY, UNSPECIFIED; SHOULDER PAIN; HIP PAIN, RIGHT; TENDINITIS; RUPTURE ROTATOR CUFF; FATIGUE; HEADACHE; INGUINAL PAIN, RIGHT; NECK SPASM; Screening for colon cancer; History of iron deficiency anemia; Toe pain, left; Insomnia; Neck muscle spasm; Prediabetes; Colonic adenoma; Invasive ductal carcinoma of right breast, stage 1; and BRCA1 positive on her problem list.     is allergic to ibuprofen.  Ms. Epperly had no medications administered during this visit.  Past Surgical History  Procedure Date  . Dilation and curettage of uterus   . Colonoscopy 2004 DR. SMITH    No polyp, hemorrhoids  .  Colonoscopy 06/09/10    simple adenomas   . Hystersonogram 2009  . Dilation and curettage of uterus 05/2002  . Mastectomy 1999    left breast cancer   . Breast surgery 11/1997    Left breast cancer-mastectomy  . Breast biopsy 02/13/2011    Procedure: BREAST BIOPSY WITH NEEDLE LOCALIZATION;  Surgeon: Ernestene Mention, MD;  Location: [facility];  Service: General;  Laterality: Right;  . Breast surgery 2/13-    rt axilly bx  . Portacath placement 03/27/2011    Procedure: INSERTION PORT-A-CATH;  Surgeon: Ernestene Mention, MD;  Location: [facility];  Service:  General;  Laterality: Left;  insert port a cath    Denies any headaches, dizziness, double vision, fevers, chills, night sweats, nausea, vomiting, diarrhea, constipation, chest pain, heart palpitations, shortness of breath, blood in stool, black tarry stool, urinary pain, urinary burning, urinary frequency, hematuria.   PHYSICAL EXAMINATION  ECOG PERFORMANCE STATUS: 1 - Symptomatic but completely ambulatory  Filed Vitals:   07/13/11 1204  BP: 128/67  Pulse:   Temp:     GENERAL:alert, no distress, well nourished, well developed, comfortable, cooperative, obese and smiling SKIN: skin color, texture, turgor are normal, no rashes or significant lesions HEAD: Normocephalic, No masses, lesions, tenderness or abnormalities EYES: normal, Conjunctiva are pink and non-injected EARS: External ears normal OROPHARYNX:lips, buccal mucosa, and tongue normal and mucous membranes are moist  NECK: supple, trachea midline LYMPH:  no palpable lymphadenopathy BREAST:not examined LUNGS: clear to auscultation and percussion HEART: regular rate & rhythm, no murmurs, no gallops, S1 normal and S2 normal ABDOMEN:abdomen soft, non-tender, obese and normal bowel sounds BACK: Back symmetric, no curvature. EXTREMITIES:less then 2 second capillary refill, no joint deformities, effusion, or inflammation, no skin discoloration, no clubbing, no cyanosis, positive findings:  edema trace B/L LE edema  NEURO: alert & oriented x 3 with fluent speech, no focal motor/sensory deficits, gait normal   LABORATORY DATA: CBC    Component Value Date/Time   WBC 8.8 06/25/2011 0900   RBC 3.31* 06/25/2011 0900   HGB 11.0* 06/25/2011 0900   HCT 31.9* 06/25/2011 0900   PLT 201 06/25/2011 0900   MCV 96.4 06/25/2011 0900   MCH 33.2 06/25/2011 0900   MCHC 34.5 06/25/2011 0900   RDW 20.0* 06/25/2011 0900   LYMPHSABS 1.0 06/25/2011 0900   MONOABS 0.4 06/25/2011 0900   EOSABS 0.0 06/25/2011 0900   BASOSABS 0.0 06/25/2011 0900       Chemistry      Component Value Date/Time   NA 137 06/25/2011 0900   K 3.8 06/25/2011 0900   CL 104 06/25/2011 0900   CO2 19 06/25/2011 0900   BUN 15 06/25/2011 0900   CREATININE 0.63 06/25/2011 0900      Component Value Date/Time   CALCIUM 9.8 06/25/2011 0900   ALKPHOS 60 06/25/2011 0900   AST 21 06/25/2011 0900   ALT 19 06/25/2011 0900   BILITOT 0.2* 06/25/2011 0900      PATHOLOGY: 02/13/2011  Diagnosis  1. Breast, lumpectomy, right partial  - INVASIVE GRADE III DUCTAL CARCINOMA, SPANNING 0.9 CM.  - LYMPH/VASCULAR INVASION NOT IDENTIFIED.  - INVASIVE CARCINOMA IS CLOSE TO POSTERIOR MARGIN (LESS THAN 0.1 CM).  - OTHER MARGINS ARE NEGATIVE.  - SEE ONCOLOGY TEMPLATE.  2. Breast, excision, right deep margin  - BENIGN BREAST PARENCHYMA.  - NO IN SITU OR INVASIVE CARCINOMA IDENTIFIED.  3. Breast, excision, right superior  - BENIGN BREAST PARENCHYMA.  - NO  IN SITU OR INVASIVE CARCINOMA IDENTIFIED.  4. Breast, excision, right inferior  - BENIGN BREAST PARENCHYMA.  - NO IN SITU OR INVASIVE CARCINOMA IDENTIFIED.  Microscopic Comment  1. BREAST, INVASIVE TUMOR, WITH LYMPH NODE SAMPLING  Specimen, including laterality: Right partial lumpectomy.  Procedure: Right partial lumpectomy.  Grade: III  Tubule formation: 3  Nuclear pleomorphism: 2  Mitotic:3  Tumor size (glass slide measurement and gross measurement): 0.9 cm.  Margins:  Invasive, distance to closest margin: Less than 0.1 cm away from posterior margin on original  specimen; right additional deep margin is negative (specimen #2).  Lymphovascular invasion: Not identified.  Ductal carcinoma in situ: No.  Lobular neoplasia: No.  Tumor focality: Unifocal  1 of 3  FINAL for DANNAE, KATO ([id number])  Microscopic Comment(continued)  Treatment effect: N/A  Extent of tumor: Tumor confined to breast parenchyma.  Breast prognostic profile: Will be performed on current case.  Non-neoplastic breast: Unremarkable.  TNM: pT1b,  pNX, MX.  Comments: Dr. Colonel Bald has seen the margin in consultation with agreement of the margin status. (RAH:gt,  02/16/11)  Zandra Abts MD  Pathologist, Electronic Signature  (Case signed 02/16/2011)  1. PROGNOSTIC INDICATORS - ACIS  Results  IMMUNOHISTOCHEMICAL AND MORPHOMETRIC ANALYSIS BY THE AUTOMATED CELLULAR  IMAGING SYSTEM (ACIS)  This carcinoma shows the following breast prognostic profile.  Estrogen Receptor (Negative, <1%): 0%, NEGATIVE  Progesterone Receptor (Negative, <1%): 0%, NEGATIVE  Proliferation Marker Ki67 by M IB-1 (Low<20%): 54%  COMMENT: The negative hormone receptor study(ies) in this case have an internal positive control.  All controls stained appropriately  Abigail Miyamoto MD  Pathologist, Electronic Signature  ( Signed 02/20/2011)  1. CHROMOGENIC IN-SITU HYBRIDIZATION  Interpretation  HER-2/NEU BY CISH - NO AMPLIFICATION OF HER-2 DETECTED. THE RATIO OF HER-2: CEP 17  SIGNALS WAS 1.23.  Reference range:  Ratio: HER2:CEP17 < 1.8 - gene amplification not observed  Ratio: HER2:CEP 17 1.8-2.2 - equivocal result  Ratio: HER2:CEP17 > 2.2 - gene amplification observed  Abigail Miyamoto MD  Pathologist, Electronic Signature  ( Signed 02/19/2011)  03/17/2011  Diagnosis  1. Lymph node, sentinel, biopsy, Right axillary  - ONE BENIGN LYMPH NODE WITH NO TUMOR SEEN (0/1).  2. Lymph node, sentinel, biopsy, Right axillary  - ONE BENIGN LYMPH NODE WITH NO TUMOR SEEN (0/1).  Zandra Abts MD  Pathologist, Electronic Signature  (Case signed 03/18/2011)     ASSESSMENT:  1. 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 Derrell Lolling on 02/13/2011, now followed by sentinel node biopsy, which was negative, done just the other day. Her cancer was ER negative, PR negative, Ki-67 marker high at 54%, HER2/neu was negative.  2. History of left-sided breast cancer in October 1999, status post lumpectomy initially, followed by  mastectomy because of positive margins. This was a triple-negative cancer, as well. Seven nodes were negative. It was grade 3, 2.3 cm in size, with a high mitotic index, and I treated her with AC x 4 cycles, followed by Taxol x 4 cycles, completed as of 06/05/1998. Ki-67 marker was 50% at that time.  3. BRCA1 positive  4. BRCA1 positivity in her family; her aunt, who is my patient, and her cousin are both BRCA1 positive, and she has now agreed to be seen by the genetics clinic for consultation, though she probably will not be able to afford it; so hopefully, they will be able to fund it.  5. Iron-deficiency anemia in the past.  6. History of cessation of menses in December 2010.  7. Spontaneous miscarriage in 2005.  8. D and C in October 2008.  9. Negative colonoscopy 2003.  10. Fatigue for 4 days, secondary to chemotherapy.  11. Peripheral neuropathy, mostly affecting fingers B/L, grade 1-2.  12. Toenail changes secondary to chemotherapy.      PLAN:  1. I personally reviewed and went over laboratory results with the patient. 2. Spoke with Dr. Dayton Scrape regarding the continued need for radiation if she underwent a mastectomy in light of her BRCA1 positivity.  He suggested follow-up with Dr. Mitzi Hansen regarding this question.  3. Continue with final cycle, cycle 6, of chemotherapy.  4. Encouraged the patient to keep her LE elevated when resting.  5. Appointment with Dr. Mitzi Hansen (Rad/Onc) 1-2 weeks following chemotherapy. 6. Encouraged activities as tolerated. 7. Return as scheduled for follow-up.    All questions were answered. The patient knows to call the clinic with any problems, questions or concerns. We can certainly see the patient much sooner if necessary.  The patient and plan discussed with Pierce Crane, MD, [facility] and he is in agreement with the aforementioned.  Arsalan Brisbin

## 2011-07-13 NOTE — Patient Instructions (Signed)
Kathleen Reynolds  [id number] 02/19/1960 Dr. Glenford Peers   [facility]  Discharge Instructions  RECOMMENDATIONS MADE BY THE CONSULTANT AND ANY TEST RESULTS WILL BE SENT TO YOUR REFERRING DOCTOR.   EXAM FINDINGS BY MD TODAY AND SIGNS AND SYMPTOMS TO REPORT TO CLINIC OR PRIMARY MD: Exam and discussion per PA.  You are doing well.  Report any new lumps, bone pain, shortness of breath, fevers, uncontrolled nausea or vomiting, etc.  We will get you scheduled to see Dr. Mitzi Hansen in radiation oncology.  MEDICATIONS PRESCRIBED: none      SPECIAL INSTRUCTIONS/FOLLOW-UP: Lab work Needed with chemotherapy and Return to Clinic as scheduled.   I acknowledge that I have been informed and understand all the instructions given to me and received a copy. I do not have any more questions at this time, but understand that I may call the Specialty Clinic at [facility] at [phone] during business hours should I have any further questions or need assistance in obtaining follow-up care.    __________________________________________  _____________  __________ Signature of Patient or Authorized Representative            Date                   Time    __________________________________________ Nurse's Signature

## 2011-07-16 ENCOUNTER — Encounter (HOSPITAL_BASED_OUTPATIENT_CLINIC_OR_DEPARTMENT_OTHER): Payer: Medicaid Other

## 2011-07-16 ENCOUNTER — Other Ambulatory Visit (HOSPITAL_COMMUNITY): Payer: Self-pay

## 2011-07-16 VITALS — BP 109/72 | HR 97 | Temp 97.9°F | Wt 288.8 lb

## 2011-07-16 DIAGNOSIS — C50419 Malignant neoplasm of upper-outer quadrant of unspecified female breast: Secondary | ICD-10-CM

## 2011-07-16 DIAGNOSIS — Z5111 Encounter for antineoplastic chemotherapy: Secondary | ICD-10-CM

## 2011-07-16 DIAGNOSIS — C50919 Malignant neoplasm of unspecified site of unspecified female breast: Secondary | ICD-10-CM

## 2011-07-16 LAB — CBC
MCHC: 35.5 g/dL (ref 30.0–36.0)
Platelets: 161 10*3/uL (ref 150–400)
RDW: 21.7 % — ABNORMAL HIGH (ref 11.5–15.5)
WBC: 5.7 10*3/uL (ref 4.0–10.5)

## 2011-07-16 LAB — COMPREHENSIVE METABOLIC PANEL
ALT: 23 U/L (ref 0–35)
AST: 18 U/L (ref 0–37)
Albumin: 3.7 g/dL (ref 3.5–5.2)
CO2: 20 mEq/L (ref 19–32)
Calcium: 9.7 mg/dL (ref 8.4–10.5)
Chloride: 102 mEq/L (ref 96–112)
GFR calc non Af Amer: >90 mL/min (ref >90)
Sodium: 137 mEq/L (ref 135–145)

## 2011-07-16 LAB — DIFFERENTIAL
Basophils Absolute: 0 10*3/uL (ref 0.0–0.1)
Basophils Relative: 0 % (ref 0–1)
Eosinophils Relative: 0 % (ref 0–5)
Lymphocytes Relative: 14 % (ref 12–46)
Neutro Abs: 4.7 10*3/uL (ref 1.7–7.7)

## 2011-07-16 MED ORDER — DEXAMETHASONE SODIUM PHOSPHATE 4 MG/ML IJ SOLN: 20.0000 mg | Freq: Once | INTRAMUSCULAR | Status: DC

## 2011-07-16 MED ORDER — HEPARIN SOD (PORK) LOCK FLUSH 100 UNIT/ML IV SOLN
INTRAVENOUS | Status: AC
  Filled 2011-07-16: qty 5

## 2011-07-16 MED ORDER — DOCETAXEL CHEMO INJECTION 160 MG/16ML
56.2500 mg/m2 | Freq: Once | INTRAVENOUS | Status: AC
  Administered 2011-07-16: 140 mg via INTRAVENOUS
  Filled 2011-07-16: qty 14

## 2011-07-16 MED ORDER — SODIUM CHLORIDE 0.9 % IV SOLN
Freq: Once | INTRAVENOUS | Status: AC
  Administered 2011-07-16: 09:00:00 via INTRAVENOUS

## 2011-07-16 MED ORDER — SODIUM CHLORIDE 0.9 % IV SOLN: 16.0000 mg | Freq: Once | INTRAVENOUS | Status: DC

## 2011-07-16 MED ORDER — SODIUM CHLORIDE 0.9 % IV SOLN
Freq: Once | INTRAVENOUS | Status: AC
  Administered 2011-07-16: 8 mg via INTRAVENOUS
  Filled 2011-07-16: qty 4

## 2011-07-16 MED ORDER — SODIUM CHLORIDE 0.9 % IJ SOLN
10.0000 mL | INTRAMUSCULAR | Status: DC | PRN
  Filled 2011-07-16: qty 10

## 2011-07-16 MED ORDER — HEPARIN SOD (PORK) LOCK FLUSH 100 UNIT/ML IV SOLN
500.0000 [IU] | Freq: Once | INTRAVENOUS | Status: AC | PRN
  Administered 2011-07-16: 500 [IU]
  Filled 2011-07-16: qty 5

## 2011-07-16 MED ORDER — SODIUM CHLORIDE 0.9 % IV SOLN
900.0000 mg | Freq: Once | INTRAVENOUS | Status: AC
  Administered 2011-07-16: 900 mg via INTRAVENOUS
  Filled 2011-07-16: qty 90

## 2011-07-16 MED ORDER — SODIUM CHLORIDE 0.9 % IJ SOLN
INTRAMUSCULAR | Status: AC
  Filled 2011-07-16: qty 10

## 2011-07-17 ENCOUNTER — Encounter (HOSPITAL_BASED_OUTPATIENT_CLINIC_OR_DEPARTMENT_OTHER): Payer: Medicaid Other

## 2011-07-17 DIAGNOSIS — Z5189 Encounter for other specified aftercare: Secondary | ICD-10-CM

## 2011-07-17 DIAGNOSIS — Z171 Estrogen receptor negative status [ER-]: Secondary | ICD-10-CM

## 2011-07-17 DIAGNOSIS — C50919 Malignant neoplasm of unspecified site of unspecified female breast: Secondary | ICD-10-CM

## 2011-07-17 DIAGNOSIS — C50419 Malignant neoplasm of upper-outer quadrant of unspecified female breast: Secondary | ICD-10-CM

## 2011-07-17 MED ORDER — PEGFILGRASTIM INJECTION 6 MG/0.6ML
SUBCUTANEOUS | Status: AC
  Filled 2011-07-17: qty 0.6

## 2011-07-17 MED ORDER — PEGFILGRASTIM INJECTION 6 MG/0.6ML
6.0000 mg | Freq: Once | SUBCUTANEOUS | Status: AC
  Administered 2011-07-17: 6 mg via SUBCUTANEOUS

## 2011-07-17 NOTE — Progress Notes (Signed)
Kathleen Reynolds presents today for injection per the provider's orders.  Neulasta administered administration without incident; see MAR for injection details.  Patient tolerated procedure well and without incident.  No questions or complaints noted at this time.

## 2011-07-30 ENCOUNTER — Ambulatory Visit (INDEPENDENT_AMBULATORY_CARE_PROVIDER_SITE_OTHER): Payer: Medicaid Other | Admitting: General Surgery

## 2011-07-30 ENCOUNTER — Encounter (INDEPENDENT_AMBULATORY_CARE_PROVIDER_SITE_OTHER): Payer: Self-pay | Admitting: General Surgery

## 2011-07-30 VITALS — BP 116/78 | HR 70 | Temp 97.6°F | Resp 14 | Ht 65.0 in | Wt 288.0 lb

## 2011-07-30 DIAGNOSIS — C50919 Malignant neoplasm of unspecified site of unspecified female breast: Secondary | ICD-10-CM

## 2011-07-30 NOTE — Progress Notes (Signed)
Patient ID: Kathleen Reynolds, female   DOB: 1961-02-02, 51 y.o.   MRN: [id number]  Chief Complaint  Patient presents with  . Routine Post Op    Port-a-cath 03/27/11 - recheck site    HPI Kathleen Reynolds is a 51 y.o. female.  She returns for interval followup regarding right breast cancer.  This patient underwent left total mastectomy by Dr. Elpidio Anis in 1999, and she had adjuvant chemotherapy and a Port-A-Cath.  She was found to have an abnormal mammogram right breast this year. On January 13 she underwent a right partial mastectomy was found to have a 9 mm invasive ductal carcinoma, triple negative, negative margins. Subsequent right axillary sentinel lymph node biopsy was negative. Dr. Mariel Sleet elected to give her chemotherapy and I inserted a Port-A-Cath on March 27, 2011. She has completed her chemotherapy and is going to start her radiation therapy in about 2 weeks with Dr. Mitzi Hansen.  She subsequently has had genetic testing and is found to be BRCA1 positive. She states that she is contemplating a right mastectomy to reduce recurrence risk, but has not decided yet. We talked about that for a long time including plastic surgery referral and reconstructive issues. She will let me know if she makes a decision about that in the future.  She has no problem with her right breast and the incisions. A Port-A-Cath has been working fine. The left mastectomy wound has no new problems. HPI  Past Medical History  Diagnosis Date  . Migraines   . Breast mass in female     right  . Arthritis   . Hyperlipidemia   . Breast cancer     Right  . Left rotator cuff tear   . Morbid obesity   . Anemia LIFELONG    HEAVY MENSES MAR 2012 HB 13 MCV 86.7 FERRITIN  66    Past Surgical History  Procedure Date  . Dilation and curettage of uterus   . Colonoscopy 2004 DR. SMITH    No polyp, hemorrhoids  . Colonoscopy 06/09/10    simple adenomas   . Hystersonogram 2009  . Dilation and curettage of  uterus 05/2002  . Mastectomy 1999    left breast cancer   . Breast surgery 11/1997    Left breast cancer-mastectomy  . Breast biopsy 02/13/2011    Procedure: BREAST BIOPSY WITH NEEDLE LOCALIZATION;  Surgeon: Ernestene Mention, MD;  Location: [facility];  Service: General;  Laterality: Right;  . Breast surgery 2/13-    rt axilly bx  . Portacath placement 03/27/2011    Procedure: INSERTION PORT-A-CATH;  Surgeon: Ernestene Mention, MD;  Location: [facility];  Service: General;  Laterality: Left;  insert port a cath    Family History  Problem Relation Age of Onset  . Hypertension Mother   . Cancer Father     lung cancer  . Diabetes Sister   . Cancer Brother     colon cancer  . Heart disease Sister     heart attack    Social History History  Substance Use Topics  . Smoking status: Never Smoker   . Smokeless tobacco: Never Used  . Alcohol Use: 1.0 oz/week    2 drink(s) per week     occasional    Allergies  Allergen Reactions  . Ibuprofen Itching    Current Outpatient Prescriptions  Medication Sig Dispense Refill  . aspirin (ASPIRIN LOW STRENGTH) 81 MG chewable tablet Chew 81 mg by mouth daily.        Marland Kitchen  butalbital-acetaminophen-caffeine (FIORICET, ESGIC) 50-325-40 MG per tablet Take 1 tablet by mouth 2 (two) times daily as needed. For migraine      . clotrimazole-betamethasone (LOTRISONE) cream Apply topically 2 (two) times daily.  30 g  0  . cyclobenzaprine (FLEXERIL) 10 MG tablet Take 10 mg by mouth 2 (two) times daily as needed. For muscle spasms      . dexamethasone (DECADRON) 4 MG tablet Take 2 tablets two times a day the day before Taxotere, the day of taxotere and the day after  30 tablet  1  . diazepam (VALIUM) 5 MG tablet Take 5 mg by mouth every 6 (six) hours as needed. pain      . ferrous sulfate (SLOW FE) 160 (50 FE) MG TBCR Take 160 mg by mouth daily.       . Flaxseed, Linseed, (FLAXSEED OIL) 1000 MG CAPS Take 2,000 mg by mouth daily.        Marland Kitchen LORazepam (ATIVAN) 0.5 MG tablet Take 1 tablet (0.5 mg total) by mouth every 6 (six) hours as needed (Nausea or vomiting). May take every 4 hrs prn nausea  30 tablet  1  . meclizine (ANTIVERT) 25 MG tablet Take 25 mg by mouth 3 (three) times daily as needed. For dizziness      . Multiple Vitamin (MULITIVITAMIN WITH MINERALS) TABS Take 1 tablet by mouth daily.      . ondansetron (ZOFRAN) 8 MG tablet Take 1 tablet two times a day starting the day after chemo for 3 days. Then take 1 tab two times a day as needed for nausea or vomiting.  30 tablet  1  . prochlorperazine (COMPAZINE) 25 MG suppository Place 1 suppository (25 mg total) rectally every 12 (twelve) hours as needed for nausea. Every 6 hrs  12 suppository  3   Current Facility-Administered Medications  Medication Dose Route Frequency Provider Last Rate Last Dose  . Influenza (>/= 3 years) inactive virus vaccine (FLVIRIN/FLUZONE) injection SUSP 0.5 mL  0.5 mL Intramuscular Once Kerri Perches, MD        Review of Systems Review of Systems  Constitutional:       Hot flashes.    Blood pressure 116/78, pulse 70, temperature 97.6 F (36.4 C), temperature source Temporal, resp. rate 14, height 5\' 5"  (1.651 m), weight 288 lb (130.636 kg), last menstrual period 03/27/2011.  Physical Exam Physical Exam  Constitutional: She is oriented to person, place, and time. She appears well-developed and well-nourished. No distress.  HENT:  Head: Normocephalic and atraumatic.  Nose: Nose normal.  Mouth/Throat: No oropharyngeal exudate.  Eyes: Conjunctivae and EOM are normal. Pupils are equal, round, and reactive to light. Left eye exhibits no discharge. No scleral icterus.  Neck: Neck supple. No JVD present. No tracheal deviation present. No thyromegaly present.  Cardiovascular: Normal rate, regular rhythm, normal heart sounds and intact distal pulses.   No murmur heard. Pulmonary/Chest: Effort normal and breath sounds normal. No respiratory  distress. She has no wheezes. She has no rales. She exhibits no tenderness.       Left mastectomy wound well healed. No nodules or ulcerations. No axillary adenopathy. Port-A-Cath palpable left infraclavicular area, wound looks fine. Right breast is very large. Incision in upper outer quadrant healing uneventfully. No defect. The right axillary incision well-healed. No adenopathy or fluid.no arm swelling or sensory deficit.  Musculoskeletal: She exhibits no edema and no tenderness.  Lymphadenopathy:    She has no cervical adenopathy.  Neurological: She is alert and  oriented to person, place, and time. She exhibits normal muscle tone. Coordination normal.  Skin: Skin is warm. No rash noted. She is not diaphoretic. No erythema. No pallor.  Psychiatric: She has a normal mood and affect. Her behavior is normal. Judgment and thought content normal.    Data Reviewed Old records and reports  Assessment    Invasive ductal carcinoma right breast, upper outer quadrant, triple negative, BRCA1 positive, pathologic stage T1b., N0.  Uneventful recovery following right partial mastectomy, sentinel node biopsy, and Port-A-Cath insertion.  Patient is contemplating right total mastectomy for risk reduction because of her genetic mutation, BRCA1.    Plan    I told her to discuss the Port-A-Cath need for Dr. Mariel Sleet, and to call me when Dr. Mariel Sleet says that the Port-A-Cath can be removed.  Okay to proceed with adjuvant whole breast radiation therapy with Dr. Mitzi Hansen next month.  Patient will call me if she becomes more interested in right mastectomy with or without reconstruction and I will coordinate plastic surgery referral. She was advised that if she chooses right mastectomy she may not need radiation therapy  If she decides to proceed with radiation therapy and put off this decision, that she will get mammograms of the right breast in January 2014 and see me thereafter.       Angelia Mould.  Derrell Lolling, M.D., [facility] General and Minimally invasive Surgery Breast and Colorectal Surgery Office:   [phone] Pager:   [phone]  07/30/2011, 11:24 AM

## 2011-07-30 NOTE — Patient Instructions (Signed)
Your right breast and right axillary incisions are well healed. There is no sign of any surgical complication and no evidence of cancer. Your Port-A-Cath site looks fine, as well.  Please let me know when Dr. Mariel Sleet thinks that it is okay to remove the Port-A-Cath, and we will set up that minor surgery.  Return to see me at any time if you decide that you want to consider a right mastectomy with or without reconstruction.  Otherwise, we will plan to get a right breast mammogram in January of 2014, and you should return to see Dr. Derrell Lolling after that mammogram is done.

## 2011-08-03 ENCOUNTER — Telehealth (INDEPENDENT_AMBULATORY_CARE_PROVIDER_SITE_OTHER): Payer: Self-pay | Admitting: General Surgery

## 2011-08-03 ENCOUNTER — Encounter (HOSPITAL_COMMUNITY): Payer: Medicaid Other | Attending: Oncology | Admitting: Oncology

## 2011-08-03 ENCOUNTER — Other Ambulatory Visit (INDEPENDENT_AMBULATORY_CARE_PROVIDER_SITE_OTHER): Payer: Self-pay | Admitting: General Surgery

## 2011-08-03 VITALS — BP 114/69 | HR 100 | Temp 98.1°F | Wt 288.9 lb

## 2011-08-03 DIAGNOSIS — R0602 Shortness of breath: Secondary | ICD-10-CM

## 2011-08-03 DIAGNOSIS — C50919 Malignant neoplasm of unspecified site of unspecified female breast: Secondary | ICD-10-CM | POA: Insufficient documentation

## 2011-08-03 DIAGNOSIS — Z1501 Genetic susceptibility to malignant neoplasm of breast: Secondary | ICD-10-CM

## 2011-08-03 DIAGNOSIS — Z171 Estrogen receptor negative status [ER-]: Secondary | ICD-10-CM

## 2011-08-03 NOTE — Patient Instructions (Addendum)
Glen Blatchley  [id number] [birth year] Dr. Glenford Peers   [facility]  Discharge Instructions  RECOMMENDATIONS MADE BY THE CONSULTANT AND ANY TEST RESULTS WILL BE SENT TO YOUR REFERRING DOCTOR.   EXAM FINDINGS BY MD TODAY AND SIGNS AND SYMPTOMS TO REPORT TO CLINIC OR PRIMARY MD: you are doing well.  Report any new lumps, bone pain or shortness of breath.  MEDICATIONS PRESCRIBED: none      SPECIAL INSTRUCTIONS/FOLLOW-UP: Return to Clinic for port flushes every 6 weeks and for follow-up in 6 months.   I acknowledge that I have been informed and understand all the instructions given to me and received a copy. I do not have any more questions at this time, but understand that I may call the Specialty Clinic at [facility] at [phone] during business hours should I have any further questions or need assistance in obtaining follow-up care.    __________________________________________  _____________  __________ Signature of Patient or Authorized Representative            Date                   Time    __________________________________________ Nurse's Signature

## 2011-08-03 NOTE — Telephone Encounter (Signed)
Called cell number in system it is no longer valid. Called home # and left message for patient to return call.   Per Dr. Derrell Lolling the patient needs to confirm when she would like the port removed after radiation treatment ends. Patient needs to advise if she wants it done under local or sedation.

## 2011-08-03 NOTE — Progress Notes (Signed)
Problem #1 right sided triple negative breast cancer 9 mm in size status post wire localization and removal by Dr. Claud Kelp on 02/13/2011 followed by sentinel node biopsy which was negative. Her Ki-67 marker was high at 54% HER-2/neu was nonamplified and she was ER/PR negative. She is now status post 6 cycles of carboplatinum and Taxotere and will embark on radiation therapy starting Wednesday.  Problem #2 BRCA1 positivity and I've asked her to talk to Dr.Cousins about her ovaries getting removed in the near future.  Problem #3 history of left-sided breast cancer stage II grade 3 triple negative at that time as well with 7 negative nodes but a 2.3 cm primary with mastectomy on 11/27/1997. She was treated with a.c. x4 cycles followed by Taxol x4 cycles thus far without recurrence.  Kathleen Reynolds feels tired and weak symptoms short of breath when she exerts herself. She is not having chest pain. She developed a tender left cervical lymph node yesterday at the angle of jaw which is a centimeter across and deathly tender but throat is perfectly clear she has no fever no chills. Her lungs are clear. The left chest walls clear. The right breast remains negative for any masses incisions well-healed. Her heart shows a regular rhythm and rate without murmur rub or gallop. Vital signs are stable. Leg are a little puffy but there is no pitting edema. Her abdomen is obese and nontender. Bowel sounds are normal. Her Port-A-Cath is intact. She has alopecia.  She will move on with radiation therapy we will see her back in 6 months if her throat developed soreness or she develops fever or chills she is to call us. She can take ibuprofen or Tylenol for the sore lymph node in the left neck.  With her BRCA1 positivity which is no surprise with her family history she certainly needs to followup with her gynecologist concerning her ovaries being removed.

## 2011-08-27 ENCOUNTER — Encounter (HOSPITAL_COMMUNITY): Payer: Medicaid Other

## 2011-08-27 ENCOUNTER — Encounter (HOSPITAL_BASED_OUTPATIENT_CLINIC_OR_DEPARTMENT_OTHER): Payer: Medicaid Other

## 2011-08-27 DIAGNOSIS — C50419 Malignant neoplasm of upper-outer quadrant of unspecified female breast: Secondary | ICD-10-CM

## 2011-08-27 DIAGNOSIS — Z452 Encounter for adjustment and management of vascular access device: Secondary | ICD-10-CM

## 2011-08-27 MED ORDER — HEPARIN SOD (PORK) LOCK FLUSH 100 UNIT/ML IV SOLN
500.0000 [IU] | Freq: Once | INTRAVENOUS | Status: AC
  Administered 2011-08-27: 500 [IU] via INTRAVENOUS
  Filled 2011-08-27: qty 5

## 2011-08-27 MED ORDER — SODIUM CHLORIDE 0.9 % IJ SOLN
10.0000 mL | INTRAMUSCULAR | Status: DC | PRN
  Administered 2011-08-27: 10 mL via INTRAVENOUS
  Filled 2011-08-27: qty 10

## 2011-08-27 MED ORDER — HEPARIN SOD (PORK) LOCK FLUSH 100 UNIT/ML IV SOLN
INTRAVENOUS | Status: AC
  Filled 2011-08-27: qty 5

## 2011-08-27 MED ORDER — SODIUM CHLORIDE 0.9 % IJ SOLN
INTRAMUSCULAR | Status: AC
  Filled 2011-08-27: qty 10

## 2011-08-27 NOTE — Progress Notes (Signed)
Kathleen Reynolds presented for Portacath access and flush.  Proper placement of portacath confirmed by CXR.  Portacath located left chest wall accessed with  H 20 needle.  Good blood return present. Portacath flushed with 20ml NS and 500U/61ml Heparin and needle removed intact.  Procedure without incident.  Patient tolerated procedure well.

## 2011-09-25 ENCOUNTER — Telehealth (INDEPENDENT_AMBULATORY_CARE_PROVIDER_SITE_OTHER): Payer: Self-pay | Admitting: General Surgery

## 2011-09-25 NOTE — Telephone Encounter (Signed)
Dr. Mitzi Hansen, pt's radiation oncologist, reports that her treatments are done, and it's OK to remove her PAC once Dr. Mariel Sleet signs off on it.

## 2011-09-29 ENCOUNTER — Telehealth (INDEPENDENT_AMBULATORY_CARE_PROVIDER_SITE_OTHER): Payer: Self-pay | Admitting: General Surgery

## 2011-09-29 NOTE — Telephone Encounter (Signed)
Called patient at Dr. Jacinto Halim request to question her about approved removal of port-a-cath by Dr. Mariel Sleet. The patient agreed to have it removed under local anesthesia at [facility]. Advised patient she will be contacted with the day and time for removal. Patient agreed.

## 2011-10-08 ENCOUNTER — Encounter (HOSPITAL_COMMUNITY): Payer: Medicaid Other

## 2011-10-19 NOTE — H&P (Signed)
Kathleen Reynolds     MRN: [id number]   Description: 51 year old female  Provider: Ernestene Mention, MD  Department: [facility]      Diagnoses     Invasive ductal carcinoma of breast, stage 1   - Primary    174.9      Vitals       BP Pulse Temp Resp Ht Wt    116/78 70 97.6 F (36.4 C) (Temporal) 14 5\' 5"  (1.651 m) 288 lb (130.636 kg)    BMI - 47.93 kg/m2 03/27/2011              History and Physical   Ernestene Mention, MD   Patient ID: Kathleen Reynolds, female   DOB: 01-21-1961, 51 y.o.   MRN: [id number]                 HPI Kathleen Reynolds is a 51 y.o. female.  She returns for interval followup regarding right breast cancer.   This patient underwent left total mastectomy by Dr. Elpidio Anis in 1999, and she had adjuvant chemotherapy and a Port-A-Cath.   She was found to have an abnormal mammogram right breast this year. On January 13 she underwent a right partial mastectomy was found to have a 9 mm invasive ductal carcinoma, triple negative, negative margins. Subsequent right axillary sentinel lymph node biopsy was negative. Dr. Mariel Sleet elected to give her chemotherapy and I inserted a Port-A-Cath on March 27, 2011. She has completed her chemotherapy and is going to start her radiation therapy in about 2 weeks with Dr. Mitzi Hansen.   She subsequently has had genetic testing and is found to be BRCA1 positive. She states that she contemplated a right mastectomy to reduce recurrence risk, but probably will not do this..   She has no problem with her right breast and the incisions. A Port-A-Cath has been working fine. The left mastectomy wound has no new problems.     Past Medical History   Diagnosis  Date   .  Migraines     .  Breast mass in female         right   .  Arthritis     .  Hyperlipidemia     .  Breast cancer         Right   .  Left rotator cuff tear     .  Morbid obesity     .  Anemia  LIFELONG       HEAVY MENSES MAR 2012 HB 13 MCV  86.7 FERRITIN  66       Past Surgical History   Procedure  Date   .  Dilation and curettage of uterus     .  Colonoscopy  2004 DR. SMITH       No polyp, hemorrhoids   .  Colonoscopy  06/09/10       simple adenomas    .  Hystersonogram  2009   .  Dilation and curettage of uterus  05/2002   .  Mastectomy  1999       left breast cancer    .  Breast surgery  11/1997       Left breast cancer-mastectomy   .  Breast biopsy  02/13/2011       Procedure: BREAST BIOPSY WITH NEEDLE LOCALIZATION;  Surgeon: Ernestene Mention, MD;  Location: [facility];  Service: General;  Laterality: Right;   .  Breast surgery  2/13-  rt axilly bx   .  Portacath placement  03/27/2011       Procedure: INSERTION PORT-A-CATH;  Surgeon: Ernestene Mention, MD;  Location: [facility];  Service: General;  Laterality: Left;  insert port a cath       Family History   Problem  Relation  Age of Onset   .  Hypertension  Mother     .  Cancer  Father         lung cancer   .  Diabetes  Sister     .  Cancer  Brother         colon cancer   .  Heart disease  Sister         heart attack      Social History History   Substance Use Topics   .  Smoking status:  Never Smoker    .  Smokeless tobacco:  Never Used   .  Alcohol Use:  1.0 oz/week       2 drink(s) per week         occasional       Allergies   Allergen  Reactions   .  Ibuprofen  Itching       Current Outpatient Prescriptions   Medication  Sig  Dispense  Refill   .  aspirin (ASPIRIN LOW STRENGTH) 81 MG chewable tablet  Chew 81 mg by mouth daily.           .  butalbital-acetaminophen-caffeine (FIORICET, ESGIC) 50-325-40 MG per tablet  Take 1 tablet by mouth 2 (two) times daily as needed. For migraine         .  clotrimazole-betamethasone (LOTRISONE) cream  Apply topically 2 (two) times daily.   30 g   0   .  cyclobenzaprine (FLEXERIL) 10 MG tablet  Take 10 mg by mouth 2 (two) times daily as needed. For muscle spasms         .   dexamethasone (DECADRON) 4 MG tablet  Take 2 tablets two times a day the day before Taxotere, the day of taxotere and the day after   30 tablet   1   .  diazepam (VALIUM) 5 MG tablet  Take 5 mg by mouth every 6 (six) hours as needed. pain         .  ferrous sulfate (SLOW FE) 160 (50 FE) MG TBCR  Take 160 mg by mouth daily.          .  Flaxseed, Linseed, (FLAXSEED OIL) 1000 MG CAPS  Take 2,000 mg by mouth daily.          Marland Kitchen  LORazepam (ATIVAN) 0.5 MG tablet  Take 1 tablet (0.5 mg total) by mouth every 6 (six) hours as needed (Nausea or vomiting). May take every 4 hrs prn nausea   30 tablet   1   .  meclizine (ANTIVERT) 25 MG tablet  Take 25 mg by mouth 3 (three) times daily as needed. For dizziness         .  Multiple Vitamin (MULITIVITAMIN WITH MINERALS) TABS  Take 1 tablet by mouth daily.         .  ondansetron (ZOFRAN) 8 MG tablet  Take 1 tablet two times a day starting the day after chemo for 3 days. Then take 1 tab two times a day as needed for nausea or vomiting.   30 tablet   1   .  prochlorperazine (COMPAZINE) 25 MG  suppository  Place 1 suppository (25 mg total) rectally every 12 (twelve) hours as needed for nausea. Every 6 hrs   12 suppository   3       Current Facility-Administered Medications   Medication  Dose  Route  Frequency  Provider  Last Rate  Last Dose   .  Influenza (>/= 3 years) inactive virus vaccine (FLVIRIN/FLUZONE) injection SUSP 0.5 mL   0.5 mL  Intramuscular  Once  Kerri Perches, MD            Review of Systems  Constitutional:        Hot flashes.    Blood pressure 116/78, pulse 70, temperature 97.6 F (36.4 C), temperature source Temporal, resp. rate 14, height 5\' 5"  (1.651 m), weight 288 lb (130.636 kg), last menstrual period 03/27/2011.   Physical Exam  Constitutional: She is oriented to person, place, and time. She appears well-developed and well-nourished. No distress.  HENT:   Head: Normocephalic and atraumatic.   Nose: Nose normal.   Mouth/Throat:  No oropharyngeal exudate.  Eyes: Conjunctivae and EOM are normal. Pupils are equal, round, and reactive to light. Left eye exhibits no discharge. No scleral icterus.  Neck: Neck supple. No JVD present. No tracheal deviation present. No thyromegaly present.  Cardiovascular: Normal rate, regular rhythm, normal heart sounds and intact distal pulses.    No murmur heard. Pulmonary/Chest: Effort normal and breath sounds normal. No respiratory distress. She has no wheezes. She has no rales. She exhibits no tenderness.       Left mastectomy wound well healed. No nodules or ulcerations. No axillary adenopathy. Port-A-Cath palpable left infraclavicular area, wound looks fine. Right breast is very large. Incision in upper outer quadrant healing uneventfully. No defect. The right axillary incision well-healed. No adenopathy or fluid.no arm swelling or sensory deficit.  Musculoskeletal: She exhibits no edema and no tenderness.  Lymphadenopathy:    She has no cervical adenopathy.  Neurological: She is alert and oriented to person, place, and time. She exhibits normal muscle tone. Coordination normal.  Skin: Skin is warm. No rash noted. She is not diaphoretic. No erythema. No pallor.  Psychiatric: She has a normal mood and affect. Her behavior is normal. Judgment and thought content normal.    Data Reviewed Old records and reports   Assessment   Invasive ductal carcinoma right breast, upper outer quadrant, triple negative, BRCA1 positive, pathologic stage T1b., N0.   Uneventful recovery following right partial mastectomy, sentinel node biopsy, and Port-A-Cath insertion. .   Plan   Will plan to remove port a cath if OK with Dr. Vilma Meckel to proceed with adjuvant whole breast radiation therapy with Dr. Mitzi Hansen next month.   If she decides to proceed with radiation therapy and put off this decision, that she will get mammograms of the right breast in January 2014 and see me  thereafter.       Angelia Mould. Derrell Lolling, M.D., [facility] General and Minimally invasive Surgery Breast and Colorectal Surgery Office:   [phone] Pager:   [phone]

## 2011-10-20 ENCOUNTER — Ambulatory Visit (HOSPITAL_BASED_OUTPATIENT_CLINIC_OR_DEPARTMENT_OTHER)
Admission: RE | Admit: 2011-10-20 | Discharge: 2011-10-20 | Disposition: A | Discharge status: Home / Self Care | Payer: Medicaid Other | Source: Ambulatory Visit | Attending: General Surgery | Admitting: General Surgery

## 2011-10-20 ENCOUNTER — Encounter (HOSPITAL_BASED_OUTPATIENT_CLINIC_OR_DEPARTMENT_OTHER)
Admission: RE | Disposition: A | Discharge status: Home / Self Care | Payer: Self-pay | Source: Ambulatory Visit | Attending: General Surgery

## 2011-10-20 ENCOUNTER — Encounter (HOSPITAL_BASED_OUTPATIENT_CLINIC_OR_DEPARTMENT_OTHER): Payer: Self-pay | Admitting: *Deleted

## 2011-10-20 DIAGNOSIS — Z452 Encounter for adjustment and management of vascular access device: Secondary | ICD-10-CM

## 2011-10-20 DIAGNOSIS — C50919 Malignant neoplasm of unspecified site of unspecified female breast: Secondary | ICD-10-CM | POA: Diagnosis present

## 2011-10-20 DIAGNOSIS — Z9010 Acquired absence of unspecified breast and nipple: Secondary | ICD-10-CM | POA: Insufficient documentation

## 2011-10-20 DIAGNOSIS — Z79899 Other long term (current) drug therapy: Secondary | ICD-10-CM | POA: Insufficient documentation

## 2011-10-20 HISTORY — PX: PORT-A-CATH REMOVAL: SHX5289

## 2011-10-20 SURGERY — MINOR REMOVAL PORT-A-CATH
Anesthesia: LOCAL | Site: Chest | Wound class: Clean

## 2011-10-20 MED ORDER — CHLORHEXIDINE GLUCONATE 4 % EX LIQD: 1.0000 "application " | Freq: Once | CUTANEOUS | Status: DC

## 2011-10-20 MED ORDER — SODIUM BICARBONATE 4 % IV SOLN
INTRAVENOUS | Status: DC | PRN
  Administered 2011-10-20: 11:00:00 via INTRAMUSCULAR

## 2011-10-20 SURGICAL SUPPLY — 31 items
ADH SKN CLS APL DERMABOND .7 (GAUZE/BANDAGES/DRESSINGS) ×1
APL SKNCLS STERI-STRIP NONHPOA (GAUZE/BANDAGES/DRESSINGS)
BENZOIN TINCTURE PRP APPL 2/3 (GAUZE/BANDAGES/DRESSINGS) IMPLANT
BLADE SURG 15 STRL LF DISP TIS (BLADE) ×1 IMPLANT
BLADE SURG 15 STRL SS (BLADE) ×2
CLOTH BEACON ORANGE TIMEOUT ST (SAFETY) ×2 IMPLANT
DERMABOND ADVANCED (GAUZE/BANDAGES/DRESSINGS) ×1
DERMABOND ADVANCED .7 DNX12 (GAUZE/BANDAGES/DRESSINGS) IMPLANT
DRAPE SURG 17X23 STRL (DRAPES) IMPLANT
ELECT REM PT RETURN 9FT ADLT (ELECTROSURGICAL)
ELECTRODE REM PT RTRN 9FT ADLT (ELECTROSURGICAL) IMPLANT
GAUZE SPONGE 4X4 12PLY STRL LF (GAUZE/BANDAGES/DRESSINGS) IMPLANT
GLOVE ECLIPSE 6.5 STRL STRAW (GLOVE) ×1 IMPLANT
GLOVE EUDERMIC 7 POWDERFREE (GLOVE) ×2 IMPLANT
MARKER SKIN DUAL TIP RULER LAB (MISCELLANEOUS) ×2 IMPLANT
NDL SAFETY ECLIPSE 18X1.5 (NEEDLE) ×1 IMPLANT
NEEDLE HYPO 18GX1.5 SHARP (NEEDLE) ×2
NEEDLE HYPO 25X1 1.5 SAFETY (NEEDLE) ×2 IMPLANT
NS IRRIG 1000ML POUR BTL (IV SOLUTION) IMPLANT
PENCIL BUTTON HOLSTER BLD 10FT (ELECTRODE) IMPLANT
STRIP CLOSURE SKIN 1/2X4 (GAUZE/BANDAGES/DRESSINGS) IMPLANT
SUT MNCRL AB 3-0 PS2 18 (SUTURE) IMPLANT
SUT MON AB 4-0 PC3 18 (SUTURE) IMPLANT
SUT VIC AB 4-0 P-3 18XBRD (SUTURE) IMPLANT
SUT VIC AB 4-0 P3 18 (SUTURE)
SUT VIC AB 5-0 P-3 18X BRD (SUTURE) IMPLANT
SUT VIC AB 5-0 P3 18 (SUTURE)
SUT VICRYL 3-0 CR8 SH (SUTURE) ×1 IMPLANT
SWABSTICK POVIDONE IODINE SNGL (MISCELLANEOUS) IMPLANT
SYR CONTROL 10ML LL (SYRINGE) ×2 IMPLANT
TOWEL OR 17X24 6PK STRL BLUE (TOWEL DISPOSABLE) IMPLANT

## 2011-10-20 NOTE — Op Note (Signed)
Patient Name:           Kathleen Reynolds   Date of Surgery:        10/20/2011  Pre op Diagnosis:      Invasive ductal carcinoma right breast, upper outer quadrant, triple negative tumor, BRCA1 positive, pathologic stage T1b., N0.desires Port-A-Cath removal  Post op Diagnosis:    same  Procedure:                 Removal of Port-A-Cath  Surgeon:                     Angelia Mould. Derrell Lolling, M.D., FACS  Assistant:                      none  Operative Indications:   Kathleen Reynolds is a 51 y.o. female.  This patient underwent left total mastectomy by Dr. Elpidio Anis in 1999, and she had adjuvant chemotherapy and a Port-A-Cath.  She was found to have an abnormal mammogram right breast this year. On January 13 she underwent a right partial mastectomy was found to have a 9 mm invasive ductal carcinoma, triple negative, negative margins. Subsequent right axillary sentinel lymph node biopsy was negative. Kathleen Reynolds elected to give her chemotherapy and I inserted a Port-A-Cath on March 27, 2011. She has completed her chemotherapy with Kathleen Reynolds and has completed her radiation therapy with Dr. Mitzi Hansen.  She subsequently has had genetic testing and is found to be BRCA1 positive.   She has no problem with her right breast and the incisions. A Port-A-Cath has been working fine..  She desires Port-A-Cath removal   Operative Findings:       The Port-A-Cath was removed intact. There was clear fluid a around the Port-A-Cath, suggesting chronic seroma or fluid from prior treatment. This did not appear infected  Procedure in Detail:          The patient was brought to room #8 at [facility] and placed upon the operating table. The procedure was performed under local anesthesia. Surgical time out was performed. The left infraclavicular area was prepped and draped in a sterile fashion. 1% Xylocaine with epinephrine was used as a local infiltration anesthetic. A transverse incision was made  overlying the port, through the previous scar. Dissection was carried down to the port. I dissected the port away from the surrounding capsule and cut and removed  all of the Prolene sutures. The port and catheter were removed intact. There was no bleeding. The deeper tissues were closed with interrupted sutures of 3-0 Vicryl and the skin closed with a running subcuticular suture of 4-0 Monocryl and Dermabond. The patient tolerated the procedure well. There were no complications. Counts correct. EBL 5 cc.     Angelia Mould. Derrell Lolling, M.D., FACS General and Minimally Invasive Surgery Breast and Colorectal Surgery  10/20/2011 10:41 AM

## 2011-10-20 NOTE — Interval H&P Note (Signed)
History and Physical Interval Note:  10/20/2011 10:16 AM  Kathleen Reynolds  has presented today for surgery, with the diagnosis of breast cancer  The goals and the various methods of treatment have been discussed with the patient and family. After consideration of risks, benefits and other options for treatment, the patient has consented to  Procedure(s) (LRB) with comments: MINOR REMOVAL PORT-A-CATH (N/A) - Porta-cath removal as a surgical intervention .  The patient's history has been reviewed, patient examined today , no change in status, stable for surgery.  I have reviewed the patient's chart and labs.  Questions were answered to the patient's satisfaction.     Ernestene Mention

## 2011-10-21 ENCOUNTER — Encounter (HOSPITAL_BASED_OUTPATIENT_CLINIC_OR_DEPARTMENT_OTHER): Payer: Self-pay | Admitting: General Surgery

## 2011-10-22 ENCOUNTER — Ambulatory Visit: Payer: BC Managed Care – PPO | Admitting: Family Medicine

## 2011-10-23 ENCOUNTER — Telehealth (INDEPENDENT_AMBULATORY_CARE_PROVIDER_SITE_OTHER): Payer: Self-pay | Admitting: General Surgery

## 2011-10-23 NOTE — Telephone Encounter (Signed)
Called and left message for patient to call clinic back to advise of how she has been feeling since port a cath removal on 10/20/11. Patient was not at home at time of call.

## 2011-10-29 ENCOUNTER — Encounter: Payer: Self-pay | Admitting: Family Medicine

## 2011-10-29 ENCOUNTER — Ambulatory Visit (INDEPENDENT_AMBULATORY_CARE_PROVIDER_SITE_OTHER): Payer: Medicaid Other | Admitting: Family Medicine

## 2011-10-29 VITALS — BP 120/80 | HR 80 | Resp 15 | Ht 65.0 in | Wt 281.1 lb

## 2011-10-29 DIAGNOSIS — E785 Hyperlipidemia, unspecified: Secondary | ICD-10-CM

## 2011-10-29 DIAGNOSIS — R7309 Other abnormal glucose: Secondary | ICD-10-CM

## 2011-10-29 DIAGNOSIS — E669 Obesity, unspecified: Secondary | ICD-10-CM

## 2011-10-29 DIAGNOSIS — E049 Nontoxic goiter, unspecified: Secondary | ICD-10-CM

## 2011-10-29 DIAGNOSIS — R5383 Other fatigue: Secondary | ICD-10-CM

## 2011-10-29 DIAGNOSIS — R7303 Prediabetes: Secondary | ICD-10-CM

## 2011-10-29 DIAGNOSIS — E559 Vitamin D deficiency, unspecified: Secondary | ICD-10-CM

## 2011-10-29 DIAGNOSIS — R5381 Other malaise: Secondary | ICD-10-CM

## 2011-10-29 LAB — LIPID PANEL
LDL Cholesterol: 159 mg/dL — ABNORMAL HIGH (ref 0–99)
Total CHOL/HDL Ratio: 5 Ratio
Triglycerides: 124 mg/dL (ref <150)
VLDL: 25 mg/dL (ref 0–40)

## 2011-10-29 LAB — COMPREHENSIVE METABOLIC PANEL
ALT: 24 U/L (ref 0–35)
AST: 21 U/L (ref 0–37)
Alkaline Phosphatase: 61 U/L (ref 39–117)
Calcium: 9.8 mg/dL (ref 8.4–10.5)
Chloride: 104 mEq/L (ref 96–112)
Creat: 0.64 mg/dL (ref 0.50–1.10)
Total Bilirubin: 0.3 mg/dL (ref 0.3–1.2)

## 2011-10-29 LAB — CBC WITH DIFFERENTIAL/PLATELET
Basophils Relative: 0 % (ref 0–1)
Eosinophils Absolute: 0 10*3/uL (ref 0.0–0.7)
Eosinophils Relative: 1 % (ref 0–5)
HCT: 38.7 % (ref 36.0–46.0)
Hemoglobin: 13 g/dL (ref 12.0–15.0)
Lymphs Abs: 0.9 10*3/uL (ref 0.7–4.0)
MCH: 32.2 pg (ref 26.0–34.0)
MCHC: 33.6 g/dL (ref 30.0–36.0)
MCV: 95.8 fL (ref 78.0–100.0)
Monocytes Absolute: 0.2 10*3/uL (ref 0.1–1.0)
Monocytes Relative: 9 % (ref 3–12)
RBC: 4.04 MIL/uL (ref 3.87–5.11)

## 2011-10-29 NOTE — Progress Notes (Signed)
  Subjective:    Patient ID: Kathleen Reynolds, female    DOB: [birth year], 51 y.o.   MRN: [id number]  HPI Pt with recurrent breast cancer, initial was left breast in 2000, recurrence occurred in right breast diagnosed in 02/2011. She has had specifically had lumpectomy on right breast 03/2011, followed by chemotherapy for 6 treatments,statrted in 04/2011, administered at 3 week intervals, then radaition daily for 33 treatments, Monday through Friday, completed in 09/2011. Process was physically and mentally painful, but now recovered and feels well, will return to work next week  BRACA1  positive and having discussions with gyne upcoming re need for pelvic clearance  C/o bilateral knee pain last week , but this has subsequently cleared Continues to struggle with morbid obesity, wants help with individual counseling, of note, recent blood sugars have been high, so screenng for diabetes to be done    Review of Systems See HPI Denies recent fever or chills. Denies sinus pressure, nasal congestion, ear pain or sore throat. Denies chest congestion, productive cough or wheezing. Denies chest pains, palpitations and leg swelling Denies abdominal pain, nausea, vomiting,diarrhea or constipation.   Denies dysuria, frequency, hesitancy or incontinence. Denies joint pain, swelling and limitation in mobility. Denies headaches, seizures, numbness, or tingling. Denies depression, anxiety or insomnia. Minor skin breakdown in area of radiation, responding well to silvadene      Objective:   Physical Exam Patient alert and oriented and in no cardiopulmonary distress.  HEENT: No facial asymmetry, EOMI, no sinus tenderness,  oropharynx pink and moist.  Neck supple no adenopathy.  Chest: Clear to auscultation bilaterally.  CVS: S1, S2 no murmurs, no S3.  ABD: Soft non tender. Bowel sounds normal.  Ext: No edema  MS: Adequate ROM spine, shoulders, hips and knees.  Skin: Intact, no ulcerations  or rash noted.Breast not examined, pt stated at the visit she is self concious  Psych: Good eye contact, normal affect. Memory intact not anxious or depressed appearing.  CNS: CN 2-12 intact, power, tone and sensation normal throughout.        Assessment & Plan:

## 2011-10-29 NOTE — Assessment & Plan Note (Signed)
Hyperlipidemia:Low fat diet discussed and encouraged.  Updated labs today 

## 2011-10-29 NOTE — Assessment & Plan Note (Signed)
Thyroid nodule in the past with abn tSH in the past also. Rept tSh and ultrasound

## 2011-10-29 NOTE — Assessment & Plan Note (Signed)
Unchanged. Patient re-educated about  the importance of commitment to a  minimum of 150 minutes of exercise per week. The importance of healthy food choices with portion control discussed. Encouraged to start a food diary, count calories and to consider  joining a support group. Sample diet sheets offered. Goals set by the patient for the next several months.    

## 2011-10-29 NOTE — Assessment & Plan Note (Signed)
Provided with 1500 and 1800 cal diet info and referred for individual counseling  HBa1C today

## 2011-10-29 NOTE — Patient Instructions (Addendum)
F/u in 4 month  HBA1C, fasting lipid, cmp, tsh and cbc, and vit D today.  You need to follow a 1500 calorie diet to help with weight loss, and you are referred for individual counseling at [facility] nutritionist to help with weight loss  We will also provide sample 1500 calorie diet sheets   Weight loss goal of 3 to 4 pounds per month  Please ensure you keep appt with gyne when due  I am thankful that you feel much better

## 2011-11-02 ENCOUNTER — Telehealth (HOSPITAL_COMMUNITY): Payer: Self-pay | Admitting: Dietician

## 2011-11-02 NOTE — Telephone Encounter (Signed)
Received referral via fax from Dr. Simpson for dx: obesity, prediabetes.  

## 2011-11-02 NOTE — Telephone Encounter (Signed)
Appointment scheduled for 11/05/11 at 8:30 AM.

## 2011-11-05 ENCOUNTER — Encounter (HOSPITAL_COMMUNITY): Payer: Self-pay | Admitting: Dietician

## 2011-11-05 NOTE — Progress Notes (Signed)
Outpatient Initial Nutrition Assessment  Date:11/05/2011   Appt Start Time: 0825  Referring Physician: Dr. Lodema Hong Reason for Visit: obesity, prediabetes  Nutrition Assessment:  Height: 5\' 5"  (165.1 cm)   Weight: 283 lb (128.368 kg)   IBW: 125# %IBW: 276% UBW: 170# %UBW: 105% Body mass index is 47.09 kg/(m^2). Classified as extreme obesity, class III. Goal Weight: 255# (10% weight loss) Weight hx: Pt reports she has maintained her weight for the past "few years". Prior to that, she maintained a weight of 270# for 17 years. She reports her lowest adult weight was 235#, about 30 years ago, prior to having her son. Today's is her highest adult weight.   Estimated nutritional needs: 2147-2342 kcals daily, 102-128 grams protein daily, 2.1-2.3 L fluid daily  PMH:  Past Medical History  Diagnosis Date  . Migraines   . Breast mass in female     right  . Arthritis   . Hyperlipidemia   . Breast cancer     Right  . Left rotator cuff tear   . Morbid obesity   . Anemia LIFELONG    HEAVY MENSES MAR 2012 HB 13 MCV 86.7 FERRITIN  66    Medications:  Current Outpatient Rx  Name Route Sig Dispense Refill  . ASPIRIN 81 MG PO CHEW Oral Chew 81 mg by mouth daily.      Marland Kitchen BUTALBITAL-APAP-CAFFEINE 50-325-40 MG PO TABS Oral Take 1 tablet by mouth 2 (two) times daily as needed. For migraine    . FERROUS SULFATE DRIED ER 160 (50 FE) MG PO TBCR Oral Take 160 mg by mouth daily.     Marland Kitchen FLAXSEED OIL 1000 MG PO CAPS Oral Take 2,000 mg by mouth daily.     Marland Kitchen MECLIZINE HCL 25 MG PO TABS Oral Take 25 mg by mouth 3 (three) times daily as needed. For dizziness    . ADULT MULTIVITAMIN W/MINERALS CH Oral Take 1 tablet by mouth daily.      Labs: CMP     Component Value Date/Time   NA 140 10/29/2011 1000   K 4.4 10/29/2011 1000   CL 104 10/29/2011 1000   CO2 26 10/29/2011 1000   GLUCOSE 87 10/29/2011 1000   BUN 11 10/29/2011 1000   CREATININE 0.64 10/29/2011 1000   CREATININE 0.61 07/16/2011 0856   CALCIUM 9.8  10/29/2011 1000   PROT 7.4 10/29/2011 1000   ALBUMIN 4.4 10/29/2011 1000   AST 21 10/29/2011 1000   ALT 24 10/29/2011 1000   ALKPHOS 61 10/29/2011 1000   BILITOT 0.3 10/29/2011 1000   GFRNONAA >90 07/16/2011 0856   GFRAA >90 07/16/2011 0856    Lipid Panel     Component Value Date/Time   CHOL 230* 10/29/2011 1000   TRIG 124 10/29/2011 1000   HDL 46 10/29/2011 1000   CHOLHDL 5.0 10/29/2011 1000   VLDL 25 10/29/2011 1000   LDLCALC 159* 10/29/2011 1000     Lab Results  Component Value Date   HGBA1C 6.0* 10/29/2011   HGBA1C 6.3* 01/19/2011   HGBA1C 6.1* 05/08/2010   Lab Results  Component Value Date   LDLCALC 159* 10/29/2011   CREATININE 0.64 10/29/2011     Lifestyle/ social habits: Ms. Chojnowski lives in [place] with her husband and adult son. 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 She reports her stress level as a 5-6 out of 10, citing "life" as her main source of stress. She reports that she deals with stress by  walking and listening to music. She does not currently participate in regular physical activity; she will walk about 2 times per week, but it is not consistent. She is s/p chemo and radiation for breast cancer. She reports that tx make her weak and did not participate in physical activity at all during that time.   Nutrition hx/habits: Ms. Bentley reports no changes in her diet. She says "my biggest problem is I eat too much bread". She does the cooking and grocery shopping for her household. She is an avid water drinker, drinking up to 10 bottles daily. She has recently transitioned to drinking low fat milk. She admits to late night snacking in front of the television. She reports that she has not attempted to lose weight in the past.   Diet recall: Breakfast (8-9 AM): green tea (no sugar), 8 oz grapefruit juice, bowl of cereal with 1% milk (Special K, cheerios, or Raisin Bran); Snack: piece of fruit; Lunch: 1/2 cup marinated carrots (tomato soup, onion,  green pepper, vinegar, water, sugar) and 2 stuffed mushrooms (spinach parmesan cheese, garlic, feta cheese); Dinner: fried fish, cabbage, piece of cornbread; Late night snack: special K bar or popcorn or fruit or cheese and crackers)  Nutrition Diagnosis: Excessive carbohydrate intake r/t excessive snacking AEB Hgb A1c: 6.0.   Nutrition Intervention: Nutrition rx: 1500 kcal NAS, no added sugar diet; 3 meals per day; limit 1 starch per meal; low calorie beverages only physical activity as tolerated  Education/Counseling Provided: Educated pt on principles of diabetic diet and weight management. Discussed principles of energy expenditure and how changes in diet and physical activity affect weight status. Discussed carbohydrate metabolism in relation to diabetes. Educated pt on plate method, portion sizes, and sources of carbohydrate. Discussed importance of regular meal pattern. Discussed importance of adding sources of whole grains to diet to improve glycemic control. Also encouraged to choose low fat dairy, lean meats, and whole fruits and vegetables more often. Discussed nutritional content of foods commonly eaten and discussed healthier alternatives. Discussed importance of compliance to prevent further complications of disease. Educated pt on importance of physical activity (goal of at least 30 minutes 5 times per week) along with a healthy diet to achieve weight loss and glycemic goals. Discussed importance of a healthy diet along with regular physical activity (at least 30 minutes 5 times per week) to achieve weight loss goals.  Encouraged slow, moderate weight loss of 1-2# per week, or 7-10% of current body weight. Also provided plate method and "1500 Calorie Diet" handout from [facility] Nutrition Care Manual.   Understanding, Motivation, Ability to Follow Recommendations: Expect fair to good compliance.   Monitoring and Evaluation: Goals: 1) 1-2# weight loss per week; 2) Hgb A1c < 5.7; 3) Physical  activity as tolerated  Recommendations: 1) For weight loss: 1610-9604 kcals daily; 2) Do not eat in front of the TV; 3) Break up physical activity into smaller, more frequent sessions; 4) Keep food diary  F/U: 4-6 weeks. F/U 12/16/11 at 9:00 AM.   Melody Haver, RD, LDN 11/05/2011  Appt EndTime: 0930

## 2011-11-17 ENCOUNTER — Telehealth: Payer: Self-pay | Admitting: Family Medicine

## 2011-11-18 ENCOUNTER — Ambulatory Visit (HOSPITAL_COMMUNITY): Payer: Medicaid Other

## 2011-11-18 NOTE — Telephone Encounter (Signed)
Patient is aware 

## 2011-11-19 ENCOUNTER — Ambulatory Visit: Payer: Medicaid Other

## 2011-11-19 ENCOUNTER — Ambulatory Visit (HOSPITAL_COMMUNITY): Payer: Medicaid Other | Attending: Family Medicine

## 2011-11-24 ENCOUNTER — Other Ambulatory Visit: Payer: Self-pay | Admitting: Family Medicine

## 2011-11-24 DIAGNOSIS — C50919 Malignant neoplasm of unspecified site of unspecified female breast: Secondary | ICD-10-CM

## 2011-12-03 ENCOUNTER — Ambulatory Visit (INDEPENDENT_AMBULATORY_CARE_PROVIDER_SITE_OTHER): Payer: Medicaid Other

## 2011-12-03 ENCOUNTER — Ambulatory Visit (HOSPITAL_COMMUNITY)
Admission: RE | Admit: 2011-12-03 | Discharge: 2011-12-03 | Disposition: A | Discharge status: Home / Self Care | Payer: Medicaid Other | Source: Ambulatory Visit | Attending: Family Medicine | Admitting: Family Medicine

## 2011-12-03 DIAGNOSIS — E049 Nontoxic goiter, unspecified: Secondary | ICD-10-CM

## 2011-12-03 DIAGNOSIS — Z23 Encounter for immunization: Secondary | ICD-10-CM

## 2011-12-09 ENCOUNTER — Telehealth (HOSPITAL_COMMUNITY): Payer: Self-pay | Admitting: Dietician

## 2011-12-09 NOTE — Telephone Encounter (Signed)
Mailed appointment confirmation letter and instructions for appointment scheduled 12/16/11 at 9:00 AM via [place] [employer].

## 2011-12-14 ENCOUNTER — Encounter: Payer: Self-pay | Admitting: Family Medicine

## 2011-12-14 ENCOUNTER — Ambulatory Visit (INDEPENDENT_AMBULATORY_CARE_PROVIDER_SITE_OTHER): Payer: Medicaid Other | Admitting: Family Medicine

## 2011-12-14 VITALS — BP 118/72 | HR 90 | Resp 15 | Ht 65.0 in | Wt 286.4 lb

## 2011-12-14 DIAGNOSIS — M25562 Pain in left knee: Secondary | ICD-10-CM

## 2011-12-14 DIAGNOSIS — E785 Hyperlipidemia, unspecified: Secondary | ICD-10-CM

## 2011-12-14 DIAGNOSIS — E041 Nontoxic single thyroid nodule: Secondary | ICD-10-CM

## 2011-12-14 DIAGNOSIS — R7309 Other abnormal glucose: Secondary | ICD-10-CM

## 2011-12-14 DIAGNOSIS — M25569 Pain in unspecified knee: Secondary | ICD-10-CM

## 2011-12-14 DIAGNOSIS — E669 Obesity, unspecified: Secondary | ICD-10-CM

## 2011-12-14 DIAGNOSIS — G8929 Other chronic pain: Secondary | ICD-10-CM | POA: Insufficient documentation

## 2011-12-14 DIAGNOSIS — R7303 Prediabetes: Secondary | ICD-10-CM

## 2011-12-14 MED ORDER — METHYLPREDNISOLONE ACETATE 80 MG/ML IJ SUSP
80.0000 mg | Freq: Once | INTRAMUSCULAR | Status: AC
  Administered 2011-12-14: 80 mg via INTRAMUSCULAR

## 2011-12-14 MED ORDER — PREDNISONE (PAK) 5 MG PO TABS: 5.0000 mg | ORAL_TABLET | ORAL | Status: DC

## 2011-12-14 NOTE — Assessment & Plan Note (Signed)
Severe debilitating left knee pain, no inciting trauma, longstanding h/o intermittent pain and instability, refer to ortho

## 2011-12-14 NOTE — Patient Instructions (Addendum)
F/u as before.  You will get depo medrol 80mg  IM administered in the office for left knee pain, a prednisone dose pack is sent in and you are referred to Dr Romeo Apple   Please use a cane to reduce your risk of falling  Please follow a carb restricted diet to help with blood sugar control and weight loss. You need to follow a low fat diet, log into your results on home computer please. Thyroid nodules unchanged, follow up not recommended

## 2011-12-14 NOTE — Assessment & Plan Note (Signed)
Deteriorated. Patient re-educated about  the importance of commitment to a  minimum of 150 minutes of exercise per week. The importance of healthy food choices with portion control discussed. Encouraged to start a food diary, count calories and to consider  joining a support group. Sample diet sheets offered. Goals set by the patient for the next several months.    

## 2011-12-14 NOTE — Assessment & Plan Note (Signed)
Patient educated about the importance of limiting  Carbohydrate intake , the need to commit to daily physical activity for a minimum of 30 minutes , and to commit weight loss. The fact that changes in all these areas will reduce or eliminate all together the development of diabetes is stressed.   Pt has seen dietician. Will provide 15gm card also

## 2011-12-14 NOTE — Assessment & Plan Note (Signed)
Hyperlipidemia:Low fat diet discussed and encouraged.  No meds at this time 

## 2011-12-14 NOTE — Progress Notes (Signed)
  Subjective:    Patient ID: Kathleen Reynolds, female    DOB: [birth year], 51 y.o.   MRN: [id number]  HPI 2 month h/o intermittent left knee pain and swelling. Inciting trauma not known 1 week ago she walked omn a gravel driveway, states that the following days she had excessive knee swelling , pain when she bends ths kneee backward, painis primarily behind the back of the knee, very debilitated this weekend pain was a 10, has had some relief with hydrocodone Has been to dietician re nutritional counseling, no weight loss success as of yet   Review of Systems See HPI Denies recent fever or chills. Denies sinus pressure, nasal congestion, ear pain or sore throat. Denies chest congestion, productive cough or wheezing. Denies chest pains, palpitations and leg swelling Denies abdominal pain, nausea, vomiting,diarrhea or constipation.   Denies dysuria, frequency, hesitancy or incontinence.  Denies headaches, seizures, numbness, or tingling. Denies depression, anxiety or insomnia. Denies skin break down or rash.        Objective:   Physical Exam  Patient alert and oriented and in no cardiopulmonary distress.  HEENT: No facial asymmetry, EOMI, no sinus tenderness,  oropharynx pink and moist.  Neck supple no adenopathy.  Chest: Clear to auscultation bilaterally.  CVS: S1, S2 no murmurs, no S3.  ABD: Soft non tender. Bowel sounds normal.  Ext: No edema  MS: Adequate ROM spine, shoulders, hips and limited mobility in left knee which is swollen and tender. Abnormal gait  Skin: Intact, no ulcerations or rash noted.  Psych: Good eye contact, normal affect. Memory intact not anxious or depressed appearing.  CNS: CN 2-12 intact, power, tone and sensation normal throughout.       Assessment & Plan:

## 2011-12-15 ENCOUNTER — Telehealth (HOSPITAL_COMMUNITY): Payer: Self-pay | Admitting: Dietician

## 2011-12-15 NOTE — Telephone Encounter (Signed)
Pt reports she is unable to make tomorrow's appointment (12/16/11 at 9:00 AM) due to mammogram appointment. Appointment rescheduled for 12/24/11 at 9:00 AM.

## 2011-12-16 ENCOUNTER — Ambulatory Visit (HOSPITAL_COMMUNITY)
Admission: RE | Admit: 2011-12-16 | Discharge: 2011-12-16 | Disposition: A | Discharge status: Home / Self Care | Payer: Medicaid Other | Source: Ambulatory Visit | Attending: Family Medicine | Admitting: Family Medicine

## 2011-12-16 DIAGNOSIS — C50919 Malignant neoplasm of unspecified site of unspecified female breast: Secondary | ICD-10-CM

## 2011-12-16 DIAGNOSIS — Z853 Personal history of malignant neoplasm of breast: Secondary | ICD-10-CM | POA: Insufficient documentation

## 2011-12-18 NOTE — Telephone Encounter (Signed)
Mailed appointment confirmation letter and instructions for appointment scheduled 12/24/11 at 9:00 AM via [place] [employer].

## 2011-12-24 ENCOUNTER — Encounter (HOSPITAL_COMMUNITY): Payer: Self-pay | Admitting: Dietician

## 2011-12-24 NOTE — Progress Notes (Signed)
Follow-Up Outpatient Nutrition Note Date: 12/24/2011  Appt Start Time:  Nutrition Assessment:  Current weight: Weight: 283 lb (128.368 kg)  BMI: Body mass index is 47.09 kg/(m^2).  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 She reports having a lot of stress lately, related to work, a recent death in the family, and "personal stuff". She reports her clothes feel tighter. She reports that she was doing well, following a healthy diet and exercising, for the first few weeks, but stopped due to stress. She was keeping a food diary at that time as well, but also discontinued. She also reports that she has had problems with her knees, for which she will see Dr. Romeo Apple on Tuesday. She reports that her knees "didn't feel right" after chemo and now has intense pain, reporting that she was unable to participate in a wedding recently, as she was practically unable to stand.  She continues to eat in front of the TV. She reports she recently bought a "Walk Away the Pounds" DVD. She has tried this in past and has lost weight using this program. However, she is hesitant to continue using the DVD, as she is unable to tolerate some of the exercises and gets discouraged from it.  She also reports that "healthy eating can be expensive"; she reports that her husband and son do not prefer the more healthy choices and ends up buying two sets of groceries to cater to them.    Labs: CMP     Component Value Date/Time   NA 140 10/29/2011 1000   K 4.4 10/29/2011 1000   CL 104 10/29/2011 1000   CO2 26 10/29/2011 1000   GLUCOSE 87 10/29/2011 1000   BUN 11 10/29/2011 1000   CREATININE 0.64 10/29/2011 1000   CREATININE 0.61 07/16/2011 0856   CALCIUM 9.8 10/29/2011 1000   PROT 7.4 10/29/2011 1000   ALBUMIN 4.4 10/29/2011 1000   AST 21 10/29/2011 1000   ALT 24 10/29/2011 1000   ALKPHOS 61 10/29/2011 1000   BILITOT 0.3 10/29/2011 1000   GFRNONAA >90 07/16/2011  0856   GFRAA >90 07/16/2011 0856    Lipid Panel     Component Value Date/Time   CHOL 230* 10/29/2011 1000   TRIG 124 10/29/2011 1000   HDL 46 10/29/2011 1000   CHOLHDL 5.0 10/29/2011 1000   VLDL 25 10/29/2011 1000   LDLCALC 159* 10/29/2011 1000     Lab Results  Component Value Date   HGBA1C 6.0* 10/29/2011   HGBA1C 6.3* 01/19/2011   HGBA1C 6.1* 05/08/2010   Lab Results  Component Value Date   LDLCALC 159* 10/29/2011   CREATININE 0.64 10/29/2011     Diet recall: Pt reports that she has started using Smart Balance spread and olive oil.   Nutrition Diagnosis: Excessive carbohydrate intake r/t excessive snacking continues  Nutrition Intervention: Nutrition rx: 1500 kcal NAS, no added sugar diet; 3 meals per day; limit 1 starch per meal; low calorie beverages only physical activity as tolerated  Education/ counseling provided: Provided pt with emotional support. Discussed barriers to success and ideas to overcome those barriers. Suggested pt set aside time for exercise each day and choose an exercise that she is comfortable with, going at her own pace. Encouraged pt not to overexert her activity due to her knees. Reinforced importance of limiting snacks and eating in front of the TV. Suggested pt work with family members to  help "get them on board" with her plan, as she reports she accomodates their diet restrictions and preferences. Also suggested getting a support system for exercise, getting her immediate and extended family involved, as she reported that this worked for her in past. Also discussed possibility of moving exercise equipment to more desirable, excessible parts of her home; pt reports she does not want to walk down stairs to access her exercise equipment due to her knee troubles.   Understanding/Motivation/ Ability to follow recommendations: Expect fair compliance.   Monitoring and Evaluation: Previous Goals: 1) 1-2# weight loss per week goal not met; 2) Hgb A1c < 5.7- unable to  assess; 3) Physical activity as tolerated- goal not met  Goals for next visit: 1) 1-2# weight loss per week; 2) Hgb A1c < 5.7; 3) 30 minutes physical activity 5 times per week  Recommendations: 1) Do not eat in front of the TV; 2) Break up physical activity into smaller, more frequent sessions; 3) Keep food diary; 4) Modify exercises on exercise DVD or walk in place during exercises that may be too difficult for you; 5) Consider moving exercise equipment to more desirable location in the home  F/U: 6-8 weeks. Follow-up appointment scheduled for 02/18/12 at 9:00 AM.   Kathleen Reynolds, RD, LDN Date:12/24/2011 Appt EndTime: 1610

## 2011-12-29 ENCOUNTER — Ambulatory Visit (INDEPENDENT_AMBULATORY_CARE_PROVIDER_SITE_OTHER): Payer: Medicaid Other

## 2011-12-29 ENCOUNTER — Ambulatory Visit (INDEPENDENT_AMBULATORY_CARE_PROVIDER_SITE_OTHER): Payer: Medicaid Other | Admitting: Orthopedic Surgery

## 2011-12-29 ENCOUNTER — Encounter: Payer: Self-pay | Admitting: Orthopedic Surgery

## 2011-12-29 ENCOUNTER — Other Ambulatory Visit: Payer: Self-pay | Admitting: Orthopedic Surgery

## 2011-12-29 DIAGNOSIS — M25569 Pain in unspecified knee: Secondary | ICD-10-CM

## 2011-12-29 DIAGNOSIS — IMO0002 Reserved for concepts with insufficient information to code with codable children: Secondary | ICD-10-CM

## 2011-12-29 DIAGNOSIS — M1710 Unilateral primary osteoarthritis, unspecified knee: Secondary | ICD-10-CM

## 2011-12-29 MED ORDER — HYDROCODONE-ACETAMINOPHEN 5-325 MG PO TABS: 1.0000 | ORAL_TABLET | Freq: Four times a day (QID) | ORAL | Status: DC | PRN

## 2011-12-29 NOTE — Patient Instructions (Addendum)
Wear and Tear Disorders of the Knee (Arthritis, Osteoarthritis) Everyone will experience wear and tear injuries (arthritis, osteoarthritis) of the knee. These are the changes we all get as we age. They come from the joint stress of daily living. The amount of cartilage damage in your knee and your symptoms determine if you need surgery. Mild problems require approximately two months recovery time. More severe problems take several months to recover. With mild problems, your surgeon may find worn and rough cartilage surfaces. With severe changes, your surgeon may find cartilage that has completely worn away and exposed the bone. Loose bodies of bone and cartilage, bone spurs (excess bone growth), and injuries to the menisci (cushions between the large bones of your leg) are also common. All of these problems can cause pain. For a mild wear and tear problem, rough cartilage may simply need to be shaved and smoothed. For more severe problems with areas of exposed bone, your surgeon may use an instrument for roughing up the bone surfaces to stimulate new cartilage growth. Loose bodies are usually removed. Torn menisci may be trimmed or repaired. ABOUT THE ARTHROSCOPIC PROCEDURE Arthroscopy is a surgical technique. It allows your orthopedic surgeon to diagnose and treat your knee injury with accuracy. The surgeon looks into your knee through a small scope. The scope is like a small (pencil-sized) telescope. Arthroscopy is less invasive than open knee surgery. You can expect a more rapid recovery. After the procedure, you will be moved to a recovery area until most of the effects of the medication have worn off. Your caregiver will discuss the test results with you. RECOVERY The severity of the arthritis and the type of procedure performed will determine recovery time. Other important factors include age, physical condition, medical conditions, and the type of rehabilitation program. Strengthening your muscles after  arthroscopy helps guarantee a better recovery. Follow your caregiver's instructions. Use crutches, rest, elevate, ice, and do knee exercises as instructed. Your caregivers will help you and instruct you with exercises and other physical therapy required to regain your mobility, muscle strength, and functioning following surgery. Only take over-the-counter or prescription medicines for pain, discomfort, or fever as directed by your caregiver.   SEEK MEDICAL CARE IF:    There is increased bleeding (more than a small spot) from the wound.   You notice redness, swelling, or increasing pain in the wound.   Pus is coming from wound.   You develop an unexplained oral temperature above 102 F (38.9 C) , or as your caregiver suggests.   You notice a foul smell coming from the wound or dressing.   You have severe pain with motion of the knee.  SEEK IMMEDIATE MEDICAL CARE IF:    You develop a rash.   You have difficulty breathing.   You have any allergic problems.  MAKE SURE YOU:    Understand these instructions.   Will watch your condition.   Will get help right away if you are not doing well or get worse.  Document Released: 01/17/2000 Document Revised: 04/13/2011 Document Reviewed: 06/15/2007 [facility] Patient Information 2013 [place], [place].     Over the next 6 weeks apply ice as needed  Limit your activities  Avoid steps as much as possible  Limit the time you spend with the knee bent  Take hydrocodone for the next 2 weeks for pain relief then take an over the counter antiinflammatory (aleve )

## 2011-12-29 NOTE — Progress Notes (Signed)
Patient ID: Kathleen Reynolds, female   DOB: 1960/03/21, 51 y.o.   MRN: [id number] Chief Complaint  Patient presents with  . Knee Pain    LEFT knee x1 month    51 year old female with no history of any knee problems presents with a one-month history of pain in her LEFT knee. Her symptoms began after a trip to [place], where she sat with the knees flexed for approximately 5 hours. She notes feeling pressure over the front of both knees and the pain is worse in the LEFT knee. She took a prednisone Dosepak as well as a prednisone shot, which seemed to decrease the pain, though not completely. She was also taking some Aleve on an intermittent basis. Although pain is better. She still has throbbing 4/10. Intermittent pain, which is worse with walking, turning in bed, exercising and going up and down steps. She does get relief by staying off of her feet. Her pain is increased with knee flexion. She reports some catching in the knee and some swelling.  A comprehensive review of systems reveal the following positive findings: Weight gain, watering of the eyes, snoring, numbness, dizziness, easy bruising, temperature intolerance.   Past Medical History  Diagnosis Date  . Migraines   . Breast mass in female     right  . Arthritis   . Hyperlipidemia   . Breast cancer     Right  . Left rotator cuff tear   . Morbid obesity   . Anemia LIFELONG    HEAVY MENSES MAR 2012 HB 13 MCV 86.7 FERRITIN  66    Past Surgical History  Procedure Date  . Dilation and curettage of uterus   . Colonoscopy 2004 DR. SMITH    No polyp, hemorrhoids  . Colonoscopy 06/09/10    simple adenomas   . Hystersonogram 2009  . Dilation and curettage of uterus 05/2002  . Mastectomy 1999    left breast cancer   . Breast surgery 11/1997    Left breast cancer-mastectomy  . Breast biopsy 02/13/2011    Procedure: BREAST BIOPSY WITH NEEDLE LOCALIZATION;  Surgeon: Ernestene Mention, MD;  Location: [facility];  Service:  General;  Laterality: Right;  . Breast surgery 2/13-    rt axilly bx  . Portacath placement 03/27/2011    Procedure: INSERTION PORT-A-CATH;  Surgeon: Ernestene Mention, MD;  Location: [facility];  Service: General;  Laterality: Left;  insert port a cath  . Port-a-cath removal 10/20/2011    Procedure: MINOR REMOVAL PORT-A-CATH;  Surgeon: Ernestene Mention, MD;  Location: [facility];  Service: General;  Laterality: N/A;  Porta-cath removal  left    .She denied chest pain, heartburn, frequency, problems with her skin, nervousness anxiety, depression, food reactions were seasonal allergies   Physical Exam(12)  Vital signs:   GENERAL: normal development , obesity  CDV: pulses are normal   Skin: normal  Lymph: nodes were not palpable/normal  Psychiatric: awake, alert and oriented  Neuro: normal sensation  MSK  Gait: Ambulation is normal heel to toe gait without a limp 1 Inspection Of LEFT knee reveals severe medial joint line tenderness with a small joint effusion 1+ 2 Range of Motion Total knee her range of motion is 110 3 Motor Grade 5 4 Stability is Normal Other side:  5 No swelling no tenderness 6 Normal range of motion  Imaging X-ray findings included arthritic changes of the medial compartment, moderate  Assessment: Osteoarthritis LEFT knee with acute synovitis  Plan: Recommend intra-articular injection I did give her some medicine for pain to take for 2 weeks and then start an anti-inflammatory.  Followup in 6-8 weeks  Knee  Injection Procedure Note  Pre-operative Diagnosis: left knee oa  Post-operative Diagnosis: same  Indications: pain  Anesthesia: ethyl chloride   Procedure Details   Verbal consent was obtained for the procedure. Time out was completed.The joint was prepped with alcohol, followed by  Ethyl chloride spray and A 20 gauge needle was inserted into the knee via lateral approach; 4ml 1% lidocaine and 1 ml of  depomedrol  was then injected into the joint . The needle was removed and the area cleansed and dressed.  Complications:  None; patient tolerated the procedure well.

## 2012-01-13 ENCOUNTER — Encounter: Payer: Self-pay | Admitting: *Deleted

## 2012-01-15 ENCOUNTER — Other Ambulatory Visit (INDEPENDENT_AMBULATORY_CARE_PROVIDER_SITE_OTHER): Payer: Self-pay | Admitting: Otolaryngology

## 2012-01-15 DIAGNOSIS — E041 Nontoxic single thyroid nodule: Secondary | ICD-10-CM

## 2012-01-21 ENCOUNTER — Ambulatory Visit (HOSPITAL_COMMUNITY): Payer: Medicaid Other

## 2012-01-21 ENCOUNTER — Encounter: Payer: Self-pay | Admitting: Family Medicine

## 2012-01-21 ENCOUNTER — Ambulatory Visit (INDEPENDENT_AMBULATORY_CARE_PROVIDER_SITE_OTHER): Payer: Medicaid Other | Admitting: Family Medicine

## 2012-01-21 VITALS — BP 128/68 | HR 88 | Resp 18 | Ht 65.0 in | Wt 290.1 lb

## 2012-01-21 DIAGNOSIS — J069 Acute upper respiratory infection, unspecified: Secondary | ICD-10-CM

## 2012-01-21 MED ORDER — FLUTICASONE PROPIONATE 50 MCG/ACT NA SUSP: 2.0000 | Freq: Every day | NASAL | Status: DC

## 2012-01-21 MED ORDER — GUAIFENESIN-CODEINE 100-10 MG/5ML PO SYRP: 5.0000 mL | ORAL_SOLUTION | Freq: Three times a day (TID) | ORAL | Status: DC | PRN

## 2012-01-21 NOTE — Patient Instructions (Addendum)
Sudafed for 2-3 days to decongest If you have a lot of runny nose use the flonase  Plenty of fluids Cough medicine Call if you do not improve Keep previous f/u appointment with Dr. Lodema Hong

## 2012-01-23 DIAGNOSIS — J069 Acute upper respiratory infection, unspecified: Secondary | ICD-10-CM | POA: Insufficient documentation

## 2012-01-23 NOTE — Progress Notes (Signed)
  Subjective:    Patient ID: Kathleen Reynolds, female    DOB: Jun 11, 1960, 51 y.o.   MRN: [id number]  HPI Cough with congestion  24 hours, sore throat, no fever, runny nose, sneezing, works as Lawyer, +sick contacts, no diarrhea, no N/V OTC nyquil and aleve taken   Review of Systems  GEN- + fatigue, fever, weight loss,weakness, recent illness HEENT- denies eye drainage, change in vision, +nasal discharge, CVS- denies chest pain, palpitations RESP- denies SOB, +cough, wheeze ABD- denies N/V, change in stools, abd pain GU- denies dysuria, hematuria, dribbling, incontinence MSK- denies joint pain, muscle aches, injury Neuro- denies headache, dizziness, syncope, seizure activity      Objective:   Physical Exam GEN- NAD, alert and oriented x3 HEENT- PERRL, EOMI, non injected sclera, pink conjunctiva, MMM, oropharynx mild injection, TM clear bilat no effusion, no maxillary sinus tenderness,, + Nasal drainage  Neck- Supple, no LAD CVS- RRR, no murmur RESP-CTAB EXT- No edema Pulses- Radial 2+           Assessment & Plan:

## 2012-01-23 NOTE — Assessment & Plan Note (Signed)
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

## 2012-02-04 ENCOUNTER — Ambulatory Visit (INDEPENDENT_AMBULATORY_CARE_PROVIDER_SITE_OTHER): Payer: Medicaid Other | Admitting: Otolaryngology

## 2012-02-04 ENCOUNTER — Ambulatory Visit (HOSPITAL_COMMUNITY): Payer: Self-pay | Admitting: Oncology

## 2012-02-04 DIAGNOSIS — D449 Neoplasm of uncertain behavior of unspecified endocrine gland: Secondary | ICD-10-CM

## 2012-02-08 ENCOUNTER — Telehealth (HOSPITAL_COMMUNITY): Payer: Self-pay | Admitting: Dietician

## 2012-02-08 ENCOUNTER — Other Ambulatory Visit (HOSPITAL_COMMUNITY): Payer: Self-pay

## 2012-02-08 NOTE — Telephone Encounter (Signed)
Mailed appointment confirmation letter and instructions for appointment scheduled 02/18/12 at 9:00 AM via [place] Mail.

## 2012-02-09 ENCOUNTER — Ambulatory Visit (INDEPENDENT_AMBULATORY_CARE_PROVIDER_SITE_OTHER): Payer: Medicaid Other | Admitting: Orthopedic Surgery

## 2012-02-09 VITALS — BP 130/80 | Ht 65.0 in | Wt 283.0 lb

## 2012-02-09 DIAGNOSIS — S86119A Strain of other muscle(s) and tendon(s) of posterior muscle group at lower leg level, unspecified leg, initial encounter: Secondary | ICD-10-CM

## 2012-02-09 DIAGNOSIS — IMO0002 Reserved for concepts with insufficient information to code with codable children: Secondary | ICD-10-CM

## 2012-02-09 DIAGNOSIS — M179 Osteoarthritis of knee, unspecified: Secondary | ICD-10-CM

## 2012-02-09 DIAGNOSIS — S86819A Strain of other muscle(s) and tendon(s) at lower leg level, unspecified leg, initial encounter: Secondary | ICD-10-CM

## 2012-02-09 DIAGNOSIS — M1710 Unilateral primary osteoarthritis, unspecified knee: Secondary | ICD-10-CM

## 2012-02-09 DIAGNOSIS — S838X9A Sprain of other specified parts of unspecified knee, initial encounter: Secondary | ICD-10-CM

## 2012-02-09 NOTE — Patient Instructions (Signed)
Take the hydrocodone for 2 weeks for the left calf injury   Apply heat to right calf

## 2012-02-09 NOTE — Progress Notes (Signed)
Subjective:     Patient ID: Zarielle Cea, female   DOB: 07-06-1960, 52 y.o.   MRN: [id number]  Chief Complaint  Patient presents with  . Follow-up    6 week follow up left knee    HPI Left knee fine   Right leg injury Sunday twisting pain right calf, swelling, difficulty walking   Review of Systems No catching or locking     Objective:   Physical Exam BP 130/80  Ht 5\' 5"  (1.651 m)  Wt 283 lb (128.368 kg)  BMI 47.09 kg/m2  LMP 03/27/2011 Gen Overweight  AAO X 3  CDV: pulses are normal   Skin: normal  MSK Gait:  NORMAL  1 RIGHT KNEE NO SWELLING NO TENDERNESS, PAIN AND TENDERNESS OVER THE RIGHT MEDIAL CALF. KNEE ROM IS NORMAL  2  KNEE STABILITY IS NORMAL     Assessment:     CALF STRAIN -RIGHT  LEFT KNEE OA STABLE     Plan:     HEAT, NORCO X 2 WEEKS

## 2012-02-10 ENCOUNTER — Ambulatory Visit (HOSPITAL_COMMUNITY): Payer: Self-pay | Admitting: Oncology

## 2012-02-10 ENCOUNTER — Encounter (HOSPITAL_COMMUNITY): Payer: Self-pay | Admitting: Oncology

## 2012-02-11 ENCOUNTER — Encounter (INDEPENDENT_AMBULATORY_CARE_PROVIDER_SITE_OTHER): Payer: Self-pay | Admitting: General Surgery

## 2012-02-11 ENCOUNTER — Ambulatory Visit (INDEPENDENT_AMBULATORY_CARE_PROVIDER_SITE_OTHER): Payer: Medicaid Other | Admitting: General Surgery

## 2012-02-11 VITALS — BP 122/84 | HR 68 | Temp 96.4°F | Resp 16 | Ht 65.0 in | Wt 288.0 lb

## 2012-02-11 DIAGNOSIS — C50919 Malignant neoplasm of unspecified site of unspecified female breast: Secondary | ICD-10-CM

## 2012-02-11 DIAGNOSIS — Z1501 Genetic susceptibility to malignant neoplasm of breast: Secondary | ICD-10-CM

## 2012-02-11 NOTE — Progress Notes (Signed)
Patient ID: Kathleen Reynolds, female   DOB: [birth year], 52 y.o.   MRN: [id number]  Chief Complaint  Patient presents with  . Follow-up    3 mo PAC rem ck    HPI Kathleen Reynolds is a 52 y.o. female.  She returns for long-term followup of her bilateral breast cancer.  In 1999 she underwent left total mastectomy by Dr. Elpidio Anis. She had a Port-A-Cath and had chemotherapy.. No recurrence.  On 02/15/2011 I performed a right partial mastectomy and the pathology report showed a 9 mm invasive ductal carcinoma, triple negative breast cancer. Subsequent right axillary sentinel lymph node biopsy was negative. 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 Upper Port-A-Cath in 11/24/2011. Following her adjuvant chemotherapy and adjuvant radiation therapy I removed the Port-A-Cath on 10/20/2011.  She has no complaints. No new health problems. No complaints about her breast.  Right breast mammogram on 12/16/2011 is category 2, no focal abnormality. HPI  Past Medical History  Diagnosis Date  . Migraines   . Breast mass in female     right  . Arthritis   . Hyperlipidemia   . Breast cancer     Right  . Left rotator cuff tear   . Morbid obesity   . Anemia LIFELONG    HEAVY MENSES MAR 2012 HB 13 MCV 86.7 FERRITIN  66    Past Surgical History  Procedure Date  . Dilation and curettage of uterus   . Colonoscopy 2004 DR. SMITH    No polyp, hemorrhoids  . Colonoscopy 06/09/10    simple adenomas   . Hystersonogram 2009  . Dilation and curettage of uterus 05/2002  . Mastectomy 1999    left breast cancer   . Breast surgery 11/1997    Left breast cancer-mastectomy  . Breast biopsy 02/13/2011    Procedure: BREAST BIOPSY WITH NEEDLE LOCALIZATION;  Surgeon: Ernestene Mention, MD;  Location: [facility];  Service: General;  Laterality: Right;  . Breast surgery 2/13-    rt axilly bx  . Portacath placement 03/27/2011    Procedure: INSERTION PORT-A-CATH;  Surgeon: Ernestene Mention, MD;   Location: [facility];  Service: General;  Laterality: Left;  insert port a cath  . Port-a-cath removal 10/20/2011    Procedure: MINOR REMOVAL PORT-A-CATH;  Surgeon: Ernestene Mention, MD;  Location: [facility];  Service: General;  Laterality: N/A;  Porta-cath removal  left    Family History  Problem Relation Age of Onset  . Hypertension Mother   . Cancer Father     lung cancer  . Diabetes Sister   . Cancer Brother     colon cancer  . Heart disease Sister     heart attack    Social History History  Substance Use Topics  . Smoking status: Never Smoker   . Smokeless tobacco: Never Used  . Alcohol Use: 1.0 oz/week    2 drink(s) per week     Comment: occasional    Allergies  Allergen Reactions  . Ibuprofen Itching    Current Outpatient Prescriptions  Medication Sig Dispense Refill  . aspirin (ASPIRIN LOW STRENGTH) 81 MG chewable tablet Chew 81 mg by mouth daily.        . butalbital-acetaminophen-caffeine (FIORICET, ESGIC) 50-325-40 MG per tablet Take 1 tablet by mouth 2 (two) times daily as needed. For migraine      . ferrous sulfate (SLOW FE) 160 (50 FE) MG TBCR Take 160 mg by mouth daily.       Marland Kitchen  Flaxseed, Linseed, (FLAXSEED OIL) 1000 MG CAPS Take 2,000 mg by mouth daily.       . fluticasone (FLONASE) 50 MCG/ACT nasal spray Place 2 sprays into the nose daily.  16 g  2  . guaiFENesin-codeine (ROBITUSSIN AC) 100-10 MG/5ML syrup Take 5 mLs by mouth 3 (three) times daily as needed for cough.  240 mL  1  . HYDROcodone-acetaminophen (NORCO) 5-325 MG per tablet Take 1 tablet by mouth every 6 (six) hours as needed for pain.  60 tablet  0  . meclizine (ANTIVERT) 25 MG tablet Take 25 mg by mouth 3 (three) times daily as needed. For dizziness      . Multiple Vitamin (MULITIVITAMIN WITH MINERALS) TABS Take 1 tablet by mouth daily.       Current Facility-Administered Medications  Medication Dose Route Frequency Provider Last Rate Last Dose  . Influenza (>/= 3  years) inactive virus vaccine (FLVIRIN/FLUZONE) injection SUSP 0.5 mL  0.5 mL Intramuscular Once Kerri Perches, MD        Review of Systems Review of Systems  Constitutional: Negative for fever, chills and unexpected weight change.       Hot flashes.  HENT: Negative for hearing loss, congestion, sore throat, trouble swallowing and voice change.   Eyes: Negative for visual disturbance.  Respiratory: Negative for cough and wheezing.   Cardiovascular: Negative for chest pain, palpitations and leg swelling.  Gastrointestinal: Negative for nausea, vomiting, abdominal pain, diarrhea, constipation, blood in stool, abdominal distention and anal bleeding.  Genitourinary: Negative for hematuria, vaginal bleeding and difficulty urinating.  Musculoskeletal: Negative for arthralgias.  Skin: Negative for rash and wound.  Neurological: Negative for seizures, syncope and headaches.  Hematological: Negative for adenopathy. Does not bruise/bleed easily.  Psychiatric/Behavioral: Negative for confusion.    Blood pressure 122/84, pulse 68, temperature 96.4 F (35.8 C), temperature source Temporal, resp. rate 16, height 5\' 5"  (1.651 m), weight 288 lb (130.636 kg), last menstrual period 03/27/2011.  Physical Exam Physical Exam  Constitutional: She is oriented to person, place, and time. She appears well-developed and well-nourished. No distress.       Obesity. BMI of 47.93.  HENT:  Head: Normocephalic and atraumatic.  Eyes: Conjunctivae normal and EOM are normal. Pupils are equal, round, and reactive to light. Left eye exhibits no discharge. No scleral icterus.  Neck: Neck supple. No JVD present. No tracheal deviation present. No thyromegaly present.  Cardiovascular: Normal rate, regular rhythm, normal heart sounds and intact distal pulses.   No murmur heard. Pulmonary/Chest: Effort normal and breath sounds normal. No respiratory distress. She has no wheezes. She has no rales. She exhibits no  tenderness.       Left mastectomy scar oblique. No nodules, no ulceration, no axillary mass, no evidence of cancer. Right breast is very large. Well-healed lumpectomy scar superiorly. Well-healed right axillary scar. Lots of tanning from radiation therapy. No palpable mass. No other skin changes.  Musculoskeletal: She exhibits no edema and no tenderness.  Lymphadenopathy:    She has no cervical adenopathy.  Neurological: She is alert and oriented to person, place, and time. She exhibits normal muscle tone. Coordination normal.  Skin: Skin is warm. No rash noted. She is not diaphoretic. No erythema. No pallor.  Psychiatric: She has a normal mood and affect. Her behavior is normal. Judgment and thought content normal.    Data Reviewed Old office records. Old operative notes. Pathology reports. Recent mammograms.  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 Pathologic stage TI B., N0 no evidence of recurrence one year following right partial mastectomy and sentinel lymph biopsy  Status post Port-A-Cath removal left infraclavicular area. No problems  BRCA 1 positive  Obesity    Plan    Keep her regular appointments with Dr. Mariel Sleet in reasonable  Right breast mammogram November 2014  Return to see me in one year for long-term followup.       Angelia Mould. Derrell Lolling, M.D., [facility] General and Minimally invasive Surgery Breast and Colorectal Surgery Office:   [phone] Pager:   [phone]  02/11/2012, 9:19 AM

## 2012-02-11 NOTE — Patient Instructions (Addendum)
Your physical exam today reveals no evidence of cancer in your left mastectomy incision, your right breast lumpectomy incision, or the lymph node areas. Your mammogram of the right breast in November he is also normal.  Keep your regular appointments with Dr. Mariel Sleet.  Return to see Dr. Derrell Lolling in one year, after you get your annual right breast mammogram.

## 2012-02-17 ENCOUNTER — Encounter: Payer: Self-pay | Admitting: Gastroenterology

## 2012-02-18 ENCOUNTER — Encounter: Payer: Self-pay | Admitting: Gastroenterology

## 2012-02-18 ENCOUNTER — Ambulatory Visit (INDEPENDENT_AMBULATORY_CARE_PROVIDER_SITE_OTHER): Payer: Medicaid Other | Admitting: Gastroenterology

## 2012-02-18 ENCOUNTER — Telehealth (HOSPITAL_COMMUNITY): Payer: Self-pay | Admitting: Dietician

## 2012-02-18 ENCOUNTER — Ambulatory Visit (HOSPITAL_COMMUNITY): Payer: Medicaid Other | Admitting: Oncology

## 2012-02-18 VITALS — BP 110/70 | HR 76 | Temp 97.4°F | Ht 66.0 in | Wt 291.6 lb

## 2012-02-18 DIAGNOSIS — D649 Anemia, unspecified: Secondary | ICD-10-CM | POA: Insufficient documentation

## 2012-02-18 NOTE — Progress Notes (Signed)
Faxed to PCP and ONC

## 2012-02-18 NOTE — Assessment & Plan Note (Signed)
LAST HB SEP 2013 NL- NO S/sX OF ACTIVE GIB  FOLLOW UP WITH DR. Mariel Sleet. PLAN FOR LABS WITH DR. Mariel Sleet.

## 2012-02-18 NOTE — Assessment & Plan Note (Addendum)
S/P TREATMENT FOR BREAST CA. PMHX: COLON ADENOMA/FAMHX: COLON CA  DISCUSSED WEIGHT LOSS. RECOMMEND STATIONARY BIKE TO BEGIN EXERCISE PROGRAM. FOLLOW A LOW FAT DIET.  HO GIVEN. HFP ONCE A YEAR. AGREE WITH NUTRITION CONSULT. OPV IN 1 YEAR
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
## 2012-02-18 NOTE — Telephone Encounter (Signed)
Pt was a no-show for appointment scheduled for 02/18/2012 at 9:00 AM. Sent letter to pt home notifying pt of no-show and requesting rescheduling appointment.

## 2012-02-18 NOTE — Patient Instructions (Signed)
RECOMMEND STATIONARY BIKE TO BEGIN YOUR EXERCISE PROGRAM.  FOLLOW A LOW FAT DIET.  SEE INFO BELOW  AGREE WITH NUTRITION CONSULT.  FOLLOW UP IN 1 YEAR.  Low-Fat Diet BREADS, CEREALS, PASTA, RICE, DRIED PEAS, AND BEANS These products are high in carbohydrates and most are low in fat. Therefore, they can be increased in the diet as substitutes for fatty foods. They too, however, contain calories and should not be eaten in excess. Cereals can be eaten for snacks as well as for breakfast.   FRUITS AND VEGETABLES It is good to eat fruits and vegetables. Besides being sources of fiber, both are rich in vitamins and some minerals. They help you get the daily allowances of these nutrients. Fruits and vegetables can be used for snacks and desserts.  MEATS Limit lean meat, chicken, [place], and fish to no more than 6 ounces per day. Beef, Pork, and Lamb Use lean cuts of beef, pork, and lamb. Lean cuts include:  Extra-lean ground beef.  Arm roast.  Sirloin tip.  Center-cut ham.  Round steak.  Loin chops.  Rump roast.  Tenderloin.  Trim all fat off the outside of meats before cooking. It is not necessary to severely decrease the intake of red meat, but lean choices should be made. Lean meat is rich in protein and contains a highly absorbable form of iron. Premenopausal women, in particular, should avoid reducing lean red meat because this could increase the risk for low red blood cells (iron-deficiency anemia).  Chicken and [place] These are good sources of protein. The fat of poultry can be reduced by removing the skin and underlying fat layers before cooking. Chicken and [place] can be substituted for lean red meat in the diet. Poultry should not be fried or covered with high-fat sauces. Fish and Shellfish Fish is a good source of protein. Shellfish contain cholesterol, but they usually are low in saturated fatty acids. The preparation of fish is important. Like chicken and [place], they should not  be fried or covered with high-fat sauces. EGGS Egg whites contain no fat or cholesterol. They can be eaten often. Try 1 to 2 egg whites instead of whole eggs in recipes or use egg substitutes that do not contain yolk. MILK AND DAIRY PRODUCTS Use skim or 1% milk instead of 2% or whole milk. Decrease whole milk, natural, and processed cheeses. Use nonfat or low-fat (2%) cottage cheese or low-fat cheeses made from vegetable oils. Choose nonfat or low-fat (1 to 2%) yogurt. Experiment with evaporated skim milk in recipes that call for heavy cream. Substitute low-fat yogurt or low-fat cottage cheese for sour cream in dips and salad dressings. Have at least 2 servings of low-fat dairy products, such as 2 glasses of skim (or 1%) milk each day to help get your daily calcium intake. FATS AND OILS Reduce the total intake of fats, especially saturated fat. Butterfat, lard, and beef fats are high in saturated fat and cholesterol. These should be avoided as much as possible. Vegetable fats do not contain cholesterol, but certain vegetable fats, such as coconut oil, palm oil, and palm kernel oil are very high in saturated fats. These should be limited. These fats are often used in bakery goods, processed foods, popcorn, oils, and nondairy creamers. Vegetable shortenings and some peanut butters contain hydrogenated oils, which are also saturated fats. Read the labels on these foods and check for saturated vegetable oils. Unsaturated vegetable oils and fats do not raise blood cholesterol. However, they should be limited because  they are fats and are high in calories. Total fat should still be limited to 30% of your daily caloric intake. Desirable liquid vegetable oils are corn oil, cottonseed oil, olive oil, canola oil, safflower oil, soybean oil, and sunflower oil. Peanut oil is not as good, but small amounts are acceptable. Buy a heart-healthy tub margarine that has no partially hydrogenated oils in the ingredients.  Mayonnaise and salad dressings often are made from unsaturated fats, but they should also be limited because of their high calorie and fat content. Seeds, nuts, peanut butter, olives, and avocados are high in fat, but the fat is mainly the unsaturated type. These foods should be limited mainly to avoid excess calories and fat. OTHER EATING TIPS Snacks  Most sweets should be limited as snacks. They tend to be rich in calories and fats, and their caloric content outweighs their nutritional value. Some good choices in snacks are graham crackers, melba toast, soda crackers, bagels (no egg), English muffins, fruits, and vegetables. These snacks are preferable to snack crackers, [place] fries, TORTILLA CHIPS, and POTATO chips. Popcorn should be air-popped or cooked in small amounts of liquid vegetable oil. Desserts Eat fruit, low-fat yogurt, and fruit ices instead of pastries, cake, and cookies. Sherbet, angel food cake, gelatin dessert, frozen low-fat yogurt, or other frozen products that do not contain saturated fat (pure fruit juice bars, frozen ice pops) are also acceptable.  COOKING METHODS Choose those methods that use little or no fat. They include: Poaching.  Braising.  Steaming.  Grilling.  Baking.  Stir-frying.  Broiling.  Microwaving.  Foods can be cooked in a nonstick pan without added fat, or use a nonfat cooking spray in regular cookware. Limit fried foods and avoid frying in saturated fat. Add moisture to lean meats by using water, broth, cooking wines, and other nonfat or low-fat sauces along with the cooking methods mentioned above. Soups and stews should be chilled after cooking. The fat that forms on top after a few hours in the refrigerator should be skimmed off. When preparing meals, avoid using excess salt. Salt can contribute to raising blood pressure in some people.  EATING AWAY FROM HOME Order entres, potatoes, and vegetables without sauces or butter. When meat exceeds the  size of a deck of cards (3 to 4 ounces), the rest can be taken home for another meal. Choose vegetable or fruit salads and ask for low-calorie salad dressings to be served on the side. Use dressings sparingly. Limit high-fat toppings, such as bacon, crumbled eggs, cheese, sunflower seeds, and olives. Ask for heart-healthy tub margarine instead of butter.

## 2012-02-24 NOTE — Progress Notes (Signed)
Reminder in epic to follow up in one year with SF in E30 and HFP once a year

## 2012-02-25 ENCOUNTER — Ambulatory Visit: Payer: Medicaid Other | Admitting: Family Medicine

## 2012-02-25 ENCOUNTER — Encounter (HOSPITAL_COMMUNITY): Payer: Self-pay | Admitting: Oncology

## 2012-02-25 ENCOUNTER — Encounter (HOSPITAL_COMMUNITY): Payer: Medicaid Other | Attending: Oncology | Admitting: Oncology

## 2012-02-25 VITALS — BP 157/81 | HR 84 | Temp 97.6°F | Resp 18 | Wt 289.4 lb

## 2012-02-25 DIAGNOSIS — Z853 Personal history of malignant neoplasm of breast: Secondary | ICD-10-CM

## 2012-02-25 DIAGNOSIS — C50919 Malignant neoplasm of unspecified site of unspecified female breast: Secondary | ICD-10-CM

## 2012-02-25 DIAGNOSIS — Z09 Encounter for follow-up examination after completed treatment for conditions other than malignant neoplasm: Secondary | ICD-10-CM | POA: Insufficient documentation

## 2012-02-25 DIAGNOSIS — Z1501 Genetic susceptibility to malignant neoplasm of breast: Secondary | ICD-10-CM

## 2012-02-25 DIAGNOSIS — Z1509 Genetic susceptibility to other malignant neoplasm: Secondary | ICD-10-CM

## 2012-02-25 NOTE — Patient Instructions (Addendum)
.  [facility] Discharge Instructions  RECOMMENDATIONS MADE BY THE CONSULTANT AND ANY TEST RESULTS WILL BE SENT TO YOUR REFERRING PHYSICIAN.  EXAM FINDINGS BY THE PHYSICIAN TODAY AND SIGNS OR SYMPTOMS TO REPORT TO CLINIC OR PRIMARY PHYSICIAN: Exam is good   INSTRUCTIONS GIVEN AND DISCUSSED: Call us if the tender area in your rt breast changes  SPECIAL INSTRUCTIONS/FOLLOW-UP: 6 months  Thank you for choosing [facility] to provide your oncology and hematology care.  To afford each patient quality time with our providers, please arrive at least 15 minutes before your scheduled appointment time.  With your help, our goal is to use those 15 minutes to complete the necessary work-up to ensure our physicians have the information they need to help with your evaluation and healthcare recommendations.    Effective January 1st, 2014, we ask that you re-schedule your appointment with our physicians should you arrive 10 or more minutes late for your appointment.  We strive to give you quality time with our providers, and arriving late affects you and other patients whose appointments are after yours.    Again, thank you for choosing [facility].  Our hope is that these requests will decrease the amount of time that you wait before being seen by our physicians.       _____________________________________________________________  Should you have questions after your visit to [facility], please contact our office at [phone] between the hours of 8:30 a.m. and 5:00 p.m.  Voicemails left after 4:30 p.m. will not be returned until the following business day.  For prescription refill requests, have your pharmacy contact our office with your prescription refill request.

## 2012-02-25 NOTE — Progress Notes (Signed)
Kathleen Overman, MD [address] [place] [address]  1. BRCA1 positive   2. Invasive ductal carcinoma of right breast, stage 1     CURRENT THERAPY: Observation  INTERVAL HISTORY: Kathleen Reynolds [age] y.o. female returns for  regular  visit for followup of  Right sided triple negative breast cancer 9 mm in size status post wire localization and removal by Dr. Claud Kelp on 02/13/2011 followed by sentinel node biopsy which was negative. Her Ki-67 marker was high at 54% HER-2/neu was nonamplified and she was ER/PR negative. She is now status post 6 cycles of carboplatin and Taxotere and S/P radiation finishing in August 2013. AND History of left-sided breast cancer stage II grade 3 triple negative at that time as well with 7 negative nodes but a 2.3 cm primary with mastectomy on 11/27/1997. She was treated with AC. x4 cycles followed by Taxol x4 cycles thus far without recurrence.  I personally reviewed and went over laboratory results with the patient.  Her labs from September 2013 are stable and unremarkable other than mild leukopenia.  We will check labs today.  I personally reviewed and went over radiographic studies with the patient.  Mammogram in November 2013 was negative, she will be due for her next screening mammogram in the fall of 2014.   Kathleen Reynolds relays an interesting story about B/L leg pain beginning after completion of chemotherapy.  This lasted until last Thursday when it suddenly went away. She reports it has completely resolved. She has received a left knee steroid injection.  Despite this, her legs continued to pain her, but she admits that her knee felt better.  She explains that she went on a trip to [place], [place] and the trip itself caused her difficulty walking when she arrived at the facility for a wedding.  She reports that she feels great and her leg pain has resolved.   Unfortunately, Kathleen Reynolds has not yet been able to see Dr. Cherly Hensen about potential  Oophorectomy secondary to her BRCA1 positivity.  She reports that she will need a referral from her PCP since she has medicaid.  She is to see Dr. Lodema Hong next week.  I have strongly urged her to follow-up with Gynecology.  She is working for [employer].  She reports some right breast discomfort in a few areas.  Phycical exam was not impressive, but I did note some fibroglandular tissue in the right breast inferiorly just superior to the inframammary fold.   Otherwise, oncologically, ROS questioning is negative.   Past Medical History  Diagnosis Date  . Migraines   . Breast mass in female     right  . Arthritis   . Hyperlipidemia   . Breast cancer     Right  . Left rotator cuff tear   . Morbid obesity   . Anemia LIFELONG    HEAVY MENSES MAR 2012 HB 13 MCV 86.7 FERRITIN  66    has NEOPLASM, MALIGNANT, BREAST, LEFT; Thyroid nodule; DYSLIPIDEMIA; HIP PAIN, RIGHT; RUPTURE ROTATOR CUFF; FATIGUE; HEADACHE; INGUINAL PAIN, RIGHT; Screening for colon cancer; History of iron deficiency anemia; Prediabetes; Colonic adenoma; Invasive ductal carcinoma of right breast, stage 1; BRCA1 positive; Knee pain, left; OA (osteoarthritis) of knee; Viral URI; Anemia; and Obesity, morbid, BMI 40.0-49.9 on her problem list.     is allergic to ibuprofen.  Kathleen Reynolds had no medications administered during this visit.  Past Surgical History  Procedure Date  . Dilation and curettage of uterus   .  Colonoscopy 2004 DR. SMITH    No polyp, hemorrhoids  . Colonoscopy 06/09/10    ZOX:WRUEAV adenomas   . Hystersonogram 2009  . Dilation and curettage of uterus 05/2002  . Mastectomy 1999    left breast cancer   . Breast surgery 11/1997    Left breast cancer-mastectomy  . Breast biopsy 02/13/2011    Procedure: BREAST BIOPSY WITH NEEDLE LOCALIZATION;  Surgeon: Ernestene Mention, MD;  Location: [facility];  Service: General;  Laterality: Right;  . Breast surgery 2/13-    rt axilly bx    . Portacath placement 03/27/2011    Procedure: INSERTION PORT-A-CATH;  Surgeon: Ernestene Mention, MD;  Location: [facility];  Service: General;  Laterality: Left;  insert port a cath  . Port-a-cath removal 10/20/2011    Procedure: MINOR REMOVAL PORT-A-CATH;  Surgeon: Ernestene Mention, MD;  Location:  SURGERY CENTER;  Service: General;  Laterality: N/A;  Porta-cath removal  left    Denies any headaches, dizziness, double vision, fevers, chills, night sweats, nausea, vomiting, diarrhea, constipation, chest pain, heart palpitations, shortness of breath, blood in stool, black tarry stool, urinary pain, urinary burning, urinary frequency, hematuria.   PHYSICAL EXAMINATION  ECOG PERFORMANCE STATUS: 1 - Symptomatic but completely ambulatory  Filed Vitals:   02/25/12 1421  BP: 157/81  Pulse: 84  Temp: 97.6 F (36.4 C)  Resp: 18    GENERAL:alert, no distress, well nourished, well developed, comfortable, cooperative, morbidly obese and smiling SKIN: skin color, texture, turgor are normal, no rashes or significant lesions HEAD: Normocephalic, No masses, lesions, tenderness or abnormalities EYES: normal, Conjunctiva are pink and non-injected EARS: External ears normal OROPHARYNX:lips, buccal mucosa, and tongue normal and mucous membranes are moist  NECK: supple, no adenopathy, thyroid normal size, non-tender, without nodularity, no stridor, non-tender, trachea midline LYMPH:  no palpable lymphadenopathy, no hepatosplenomegaly BREAST:right breast normal without mass, skin or nipple changes or axillary nodes, 1-2 cm fibroglandular tissue in the 7 o'clock position just superior to the inframammary fold that is tender.  Minimal other fibroglandular tissue noted in the right breast.  Left  post-mastectomy site well healed and free of suspicious changes LUNGS: clear to auscultation and percussion HEART: regular rate & rhythm, no murmurs, no gallops, S1 normal and S2  normal ABDOMEN:abdomen soft, non-tender, morbidly obese and normal bowel sounds BACK: Back symmetric, no curvature., No CVA tenderness EXTREMITIES:less then 2 second capillary refill, no joint deformities, effusion, or inflammation, no edema, no skin discoloration, no clubbing, no cyanosis  NEURO: alert & oriented x 3 with fluent speech, no focal motor/sensory deficits, gait normal    LABORATORY DATA: CBC    Component Value Date/Time   WBC 2.8* 10/29/2011 1000   RBC 4.04 10/29/2011 1000   HGB 13.0 10/29/2011 1000   HCT 38.7 10/29/2011 1000   PLT 231 10/29/2011 1000   MCV 95.8 10/29/2011 1000   MCH 32.2 10/29/2011 1000   MCHC 33.6 10/29/2011 1000   RDW 14.1 10/29/2011 1000   LYMPHSABS 0.9 10/29/2011 1000   MONOABS 0.2 10/29/2011 1000   EOSABS 0.0 10/29/2011 1000   BASOSABS 0.0 10/29/2011 1000      Chemistry      Component Value Date/Time   NA 140 10/29/2011 1000   K 4.4 10/29/2011 1000   CL 104 10/29/2011 1000   CO2 26 10/29/2011 1000   BUN 11 10/29/2011 1000   CREATININE 0.64 10/29/2011 1000   CREATININE 0.61 07/16/2011 0856  Component Value Date/Time   CALCIUM 9.8 10/29/2011 1000   ALKPHOS 61 10/29/2011 1000   AST 21 10/29/2011 1000   ALT 24 10/29/2011 1000   BILITOT 0.3 10/29/2011 1000     Lab Results  Component Value Date   LABCA2 10 04/25/2007      ASSESSMENT:  1. 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 Claud Kelp on 02/13/2011 followed by sentinel node biopsy which was negative. Her Ki-67 marker was high at 54% HER-2/neu was nonamplified and she was ER/PR negative. She is now status post 6 cycles of carboplatin and Taxotere and S/P radiation.  No evidence of recurrence. 2. BRCA1 positivity.  She anticipates following up with Dr.Cousins for consideration of oophorectomy after referral from PCP next week.  3. History of left-sided breast cancer stage II grade 3 triple negative at that time as well with 7 negative  nodes but a 2.3 cm primary with mastectomy on 11/27/1997. She was treated with AC. x4 cycles followed by Taxol x4 cycles thus far without recurrence. 4. Right breast fibroglandular tissue with tenderness.  Negative mammogram in November 2013.   PLAN:  1. I personally reviewed and went over laboratory results with the patient. 2. I personally reviewed and went over radiographic studies with the patient. 3. Labs today: CBC diff, CMET 4. Encouraged patient to follow-up with Gynecology for consideration of oophorectomy secondary to her BRCA1 positivity.  She is agreeable to this.  5. Return in 6 weeks for a breast follow-up.   6. Labs in 6 months: CBC diff, CMET   All questions were answered. The patient knows to call the clinic with any problems, questions or concerns. We can certainly see the patient much sooner if necessary.  Patient and plan will be discussed with Dr. Mariel Sleet in the near future.   KEFALAS,THOMAS

## 2012-02-26 ENCOUNTER — Encounter (HOSPITAL_BASED_OUTPATIENT_CLINIC_OR_DEPARTMENT_OTHER): Payer: Medicaid Other

## 2012-02-26 DIAGNOSIS — C50919 Malignant neoplasm of unspecified site of unspecified female breast: Secondary | ICD-10-CM

## 2012-02-26 DIAGNOSIS — C50419 Malignant neoplasm of upper-outer quadrant of unspecified female breast: Secondary | ICD-10-CM

## 2012-02-26 LAB — DIFFERENTIAL
Basophils Relative: 0 % (ref 0–1)
Eosinophils Absolute: 0 10*3/uL (ref 0.0–0.7)
Monocytes Relative: 5 % (ref 3–12)
Neutro Abs: 3.4 10*3/uL (ref 1.7–7.7)
Neutrophils Relative %: 79 % — ABNORMAL HIGH (ref 43–77)

## 2012-02-26 LAB — CBC
HCT: 38.3 % (ref 36.0–46.0)
Hemoglobin: 13.2 g/dL (ref 12.0–15.0)
MCHC: 34.5 g/dL (ref 30.0–36.0)
MCV: 93.6 fL (ref 78.0–100.0)
RDW: 14.8 % (ref 11.5–15.5)

## 2012-02-26 LAB — COMPREHENSIVE METABOLIC PANEL
Albumin: 4.2 g/dL (ref 3.5–5.2)
Alkaline Phosphatase: 67 U/L (ref 39–117)
BUN: 19 mg/dL (ref 6–23)
Creatinine, Ser: 0.75 mg/dL (ref 0.50–1.10)
GFR calc Af Amer: >90 mL/min (ref >90)
Glucose, Bld: 111 mg/dL — ABNORMAL HIGH (ref 70–99)
Potassium: 3.8 mEq/L (ref 3.5–5.1)
Total Bilirubin: 0.3 mg/dL (ref 0.3–1.2)
Total Protein: 7.8 g/dL (ref 6.0–8.3)

## 2012-02-26 NOTE — Progress Notes (Signed)
Labs drawn today for cbc/diff,cmp 

## 2012-03-03 ENCOUNTER — Encounter: Payer: Self-pay | Admitting: Family Medicine

## 2012-03-03 ENCOUNTER — Ambulatory Visit (INDEPENDENT_AMBULATORY_CARE_PROVIDER_SITE_OTHER): Payer: Medicaid Other | Admitting: Family Medicine

## 2012-03-03 VITALS — BP 118/74 | HR 74 | Resp 16 | Ht 65.0 in | Wt 288.0 lb

## 2012-03-03 DIAGNOSIS — R7303 Prediabetes: Secondary | ICD-10-CM

## 2012-03-03 DIAGNOSIS — I889 Nonspecific lymphadenitis, unspecified: Secondary | ICD-10-CM

## 2012-03-03 DIAGNOSIS — IMO0002 Reserved for concepts with insufficient information to code with codable children: Secondary | ICD-10-CM

## 2012-03-03 DIAGNOSIS — R5381 Other malaise: Secondary | ICD-10-CM

## 2012-03-03 DIAGNOSIS — R5383 Other fatigue: Secondary | ICD-10-CM

## 2012-03-03 DIAGNOSIS — M1710 Unilateral primary osteoarthritis, unspecified knee: Secondary | ICD-10-CM

## 2012-03-03 DIAGNOSIS — E785 Hyperlipidemia, unspecified: Secondary | ICD-10-CM

## 2012-03-03 DIAGNOSIS — R7309 Other abnormal glucose: Secondary | ICD-10-CM

## 2012-03-03 MED ORDER — PREDNISONE 5 MG PO TABS: ORAL_TABLET | ORAL | Status: AC

## 2012-03-03 MED ORDER — CEPHALEXIN 500 MG PO CAPS: 500.0000 mg | ORAL_CAPSULE | Freq: Three times a day (TID) | ORAL | Status: AC

## 2012-03-03 MED ORDER — FLUCONAZOLE 150 MG PO TABS: ORAL_TABLET | ORAL | Status: DC

## 2012-03-03 MED ORDER — PHENTERMINE HCL 37.5 MG PO TABS: 37.5000 mg | ORAL_TABLET | Freq: Every day | ORAL | Status: DC

## 2012-03-03 NOTE — Assessment & Plan Note (Signed)
Updated HBA1C to be obtained, t needs to be consistent in weight loss attempt with diet and exercise commitment

## 2012-03-03 NOTE — Patient Instructions (Addendum)
F/u in 4 month, call if you need me before  HBa1C today.  Fasting lipid, hBA1c in 4 month  Calorie accounting os the key to weight loss, think on nutri system, start phentermine half daily  Weight loss goal of 4 pounds per month  Commit to 30 minutes of exercise 5 days per week  Medication is sent in for the tender glands in your right armopit and you will get the script to hold for the fluconazole in case you need it

## 2012-03-03 NOTE — Assessment & Plan Note (Signed)
Hyperlipidemia:Low fat diet discussed and encouraged.  Updated lab to be obtained 

## 2012-03-03 NOTE — Assessment & Plan Note (Signed)
Deteriorated. Patient re-educated about  the importance of commitment to a  minimum of 150 minutes of exercise per week. The importance of healthy food choices with portion control discussed. Encouraged to start a food diary, count calories and to consider  joining a support group. Sample diet sheets offered. Goals set by the patient for the next several months.    

## 2012-03-03 NOTE — Assessment & Plan Note (Signed)
Currently pain free, states marked improvement on completion of chemo

## 2012-03-03 NOTE — Progress Notes (Signed)
  Subjective:    Patient ID: Kathleen Reynolds, female    DOB: 1960/02/10, 52 y.o.   MRN: [id number]  HPI The PT is here for follow up and re-evaluation of chronic medical conditions, medication management and review of any available recent lab and radiology data.  Preventive health is updated, specifically  Cancer screening and Immunization.   Questions or concerns regarding consultations or procedures which the PT has had in the interim are  Addressed.Has recently been seen by oncology and also gI, still trying to get back to gyne espescialy with concerns re BRCA positivity and possible need for oophorectomy, insurance issues are the current problem, but she is trying to work on this The PT denies any adverse reactions to current medications since the last visit.  Approx 3 week h/o very tender right axillary lumps/glands, recently seen in the onc clinic and are to be re examined in March. Pt does not shave. Denies fever, chills or drainage from the area Frustrated with inconsistent weight loss efforts with no success, wants phentermine again , also thinking of nutrisystem      Review of Systems See HPI Denies recent fever or chills. Denies sinus pressure, nasal congestion, ear pain or sore throat. Denies chest congestion, productive cough or wheezing. Denies chest pains, palpitations and leg swelling Denies abdominal pain, nausea, vomiting,diarrhea or constipation.   Denies dysuria, frequency, hesitancy or incontinence. Denies joint pain, swelling and limitation in mobility.States knee stopped hurting once recent chemo ended Denies headaches, seizures, numbness, or tingling. Denies depression, anxiety or insomnia.       Objective:   Physical Exam Patient alert and oriented and in no cardiopulmonary distress.  HEENT: No facial asymmetry, EOMI, no sinus tenderness,  oropharynx pink and moist.  Neck supple no adenopathy.  Chest: Clear to auscultation bilaterally.  CVS: S1, S2 no  murmurs, no S3.  ABD: Soft non tender. Bowel sounds normal.  Ext: No edema  MS: Adequate ROM spine, shoulders, hips and knees.  Skin: Intact, tender right axillary adenitis, no erythema or drainage in the area.  Psych: Good eye contact, normal affect. Memory intact not anxious or depressed appearing.  CNS: CN 2-12 intact, power, tone and sensation normal throughout.        Assessment & Plan:

## 2012-03-03 NOTE — Assessment & Plan Note (Signed)
5 day course of anti inflammatory and antibiotic prescribed, still needs to f/u with oncology in march about this

## 2012-04-07 ENCOUNTER — Ambulatory Visit (HOSPITAL_COMMUNITY): Payer: Medicaid Other | Admitting: Oncology

## 2012-05-04 ENCOUNTER — Encounter (HOSPITAL_COMMUNITY): Payer: Medicaid Other | Attending: Oncology | Admitting: Oncology

## 2012-05-04 VITALS — BP 110/74 | HR 67 | Temp 98.0°F | Resp 18 | Wt 292.0 lb

## 2012-05-04 DIAGNOSIS — Z09 Encounter for follow-up examination after completed treatment for conditions other than malignant neoplasm: Secondary | ICD-10-CM | POA: Insufficient documentation

## 2012-05-04 DIAGNOSIS — C50419 Malignant neoplasm of upper-outer quadrant of unspecified female breast: Secondary | ICD-10-CM

## 2012-05-04 DIAGNOSIS — Z1509 Genetic susceptibility to other malignant neoplasm: Secondary | ICD-10-CM

## 2012-05-04 DIAGNOSIS — Z171 Estrogen receptor negative status [ER-]: Secondary | ICD-10-CM

## 2012-05-04 DIAGNOSIS — Z1501 Genetic susceptibility to malignant neoplasm of breast: Secondary | ICD-10-CM

## 2012-05-04 DIAGNOSIS — C50911 Malignant neoplasm of unspecified site of right female breast: Secondary | ICD-10-CM

## 2012-05-04 DIAGNOSIS — Z853 Personal history of malignant neoplasm of breast: Secondary | ICD-10-CM | POA: Insufficient documentation

## 2012-05-04 NOTE — Patient Instructions (Addendum)
[facility] Discharge Instructions  RECOMMENDATIONS MADE BY THE CONSULTANT AND ANY TEST RESULTS WILL BE SENT TO YOUR REFERRING PHYSICIAN.  EXAM FINDINGS BY THE PHYSICIAN TODAY AND SIGNS OR SYMPTOMS TO REPORT TO CLINIC OR PRIMARY PHYSICIAN: Exam and discussion by PA.  You are doing well.  No evidence of recurrence by exam.  Follow-up with Dr. Cherly Hensen regarding getting your ovaries removed.  MEDICATIONS PRESCRIBED:  none  INSTRUCTIONS GIVEN AND DISCUSSED: Report any new lumps, bone pain or shortness of breath.  SPECIAL INSTRUCTIONS/FOLLOW-UP: Return for blood work in 6 months and to see MD afterwards  Thank you for choosing [facility] to provide your oncology and hematology care.  To afford each patient quality time with our providers, please arrive at least 15 minutes before your scheduled appointment time.  With your help, our goal is to use those 15 minutes to complete the necessary work-up to ensure our physicians have the information they need to help with your evaluation and healthcare recommendations.    Effective January 1st, 2014, we ask that you re-schedule your appointment with our physicians should you arrive 10 or more minutes late for your appointment.  We strive to give you quality time with our providers, and arriving late affects you and other patients whose appointments are after yours.    Again, thank you for choosing [facility].  Our hope is that these requests will decrease the amount of time that you wait before being seen by our physicians.       _____________________________________________________________  Should you have questions after your visit to [facility], please contact our office at [phone] between the hours of 8:30 a.m. and 5:00 p.m.  Voicemails left after 4:30 p.m. will not be returned until the following business day.  For prescription refill requests, have your pharmacy contact our office  with your prescription refill request.

## 2012-05-04 NOTE — Progress Notes (Signed)
Kathleen Overman, MD [address] [place] [address]  Invasive ductal carcinoma of breast, stage 1, right  BRCA1 positive  CURRENT THERAPY: Observation and surveillance  INTERVAL HISTORY: Kathleen Reynolds [age] y.o. 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 Claud Kelp on 02/13/2011 followed by sentinel node biopsy which was negative. Her Ki-67 marker was high at 54% HER-2/neu was nonamplified and she was ER/PR negative. She is now status post 6 cycles of carboplatin and Taxotere (04/03/11- 07/16/11) and S/P radiation finishing in August 2013. AND History of left-sided breast cancer stage II grade 3 triple negative at that time as well with 7 negative nodes but a 2.3 cm primary with mastectomy on 11/27/1997. She was treated with AC. x4 cycles followed by Taxol x4 cycles thus far without recurrence.  Kathleen Reynolds is doing well.  She denies any complaints.  She reports that she had a right axillary "knot."  She saw Dr. Lodema Hong who thought it may be an infection.  She treated it with antibiotics and it has resolved.   The patient's weight is up to 292 lbs and this is bothersome for her, and rightfully so.  We spent some time discussing healthy eating choices.  She anticipates walking more now that the weather has improved.  I have encouraged her to do so.  Another option for her is water aerobics due to her weight this may be easier on her joints.   She is due for her next screening mammogram in November of 2014.  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  She is enjoying that.  She work between 20-30 hours per week.  As a result of this employment, she has new insurance.  She has not yet seen dr. Cherly Hensen regarding consideration for oophorectomy due to her BRCA1 positivity due to being on medicaid in the past.  Now that she has insurance, she has  assured me that she will get an appointment with Dr. Cherly Hensen to discuss this options.   Oncologically, she denies any complaints and ROS questioning is negative.    Past Medical History  Diagnosis Date  . Migraines   . Breast mass in female     right  . Arthritis   . Hyperlipidemia   . Breast cancer     Right  . Left rotator cuff tear   . Morbid obesity   . Anemia LIFELONG    HEAVY MENSES MAR 2012 HB 13 MCV 86.7 FERRITIN  66    has NEOPLASM, MALIGNANT, BREAST, LEFT; Thyroid nodule; DYSLIPIDEMIA; HIP PAIN, RIGHT; RUPTURE ROTATOR CUFF; FATIGUE; HEADACHE; INGUINAL PAIN, RIGHT; Screening for colon cancer; History of iron deficiency anemia; Prediabetes; Colonic adenoma; Invasive ductal carcinoma of right breast, stage 1; BRCA1 positive; Knee pain, left; OA (osteoarthritis) of knee; Anemia; Obesity, morbid, BMI 40.0-49.9; and Axillary adenitis on her problem list.     is allergic to ibuprofen.  Ms. Jeon had no medications administered during this visit.  Past Surgical History  Procedure Laterality Date  . Dilation and curettage of uterus    . Colonoscopy  2004 DR. SMITH    No polyp, hemorrhoids  . Colonoscopy  06/09/10    XBJ:YNWGNF adenomas   . Hystersonogram  2009  . Dilation and curettage of uterus  05/2002  . Mastectomy  1999    left breast cancer   .  Breast surgery  11/1997    Left breast cancer-mastectomy  . Breast biopsy  02/13/2011    Procedure: BREAST BIOPSY WITH NEEDLE LOCALIZATION;  Surgeon: Ernestene Mention, MD;  Location: [facility];  Service: General;  Laterality: Right;  . Breast surgery  2/13-    rt axilly bx  . Portacath placement  03/27/2011    Procedure: INSERTION PORT-A-CATH;  Surgeon: Ernestene Mention, MD;  Location: [facility];  Service: General;  Laterality: Left;  insert port a cath  . Port-a-cath removal  10/20/2011    Procedure: MINOR REMOVAL PORT-A-CATH;  Surgeon: Ernestene Mention, MD;  Location: [facility];   Service: General;  Laterality: N/A;  Porta-cath removal  left    Denies any headaches, dizziness, double vision, fevers, chills, night sweats, nausea, vomiting, diarrhea, constipation, chest pain, heart palpitations, shortness of breath, blood in stool, black tarry stool, urinary pain, urinary burning, urinary frequency, hematuria.   PHYSICAL EXAMINATION  ECOG PERFORMANCE STATUS: 0 - Asymptomatic  Filed Vitals:   05/04/12 1331  BP: 110/74  Pulse: 67  Temp: 98 F (36.7 C)  Resp: 18    GENERAL:alert, no distress, well nourished, well developed, comfortable, cooperative, morbidly obese and smiling  SKIN: skin color, texture, turgor are normal, no rashes or significant lesions  HEAD: Normocephalic, No masses, lesions, tenderness or abnormalities  EYES: normal, Conjunctiva are pink and non-injected  EARS: External ears normal  OROPHARYNX:lips, buccal mucosa, and tongue normal and mucous membranes are moist  NECK: supple, no adenopathy, thyroid normal size, non-tender, without nodularity, no stridor, non-tender, trachea midline  LYMPH: no palpable lymphadenopathy, no hepatosplenomegaly  BREAST:right breast normal without mass, skin or nipple changes or axillary nodes, hyperpigmentation and erythema secondary to radiation.  Left post-mastectomy site well healed and free of suspicious changes  LUNGS: clear to auscultation and percussion  HEART: regular rate & rhythm, no murmurs, no gallops, S1 normal and S2 normal  ABDOMEN:abdomen soft, non-tender, morbidly obese and normal bowel sounds  BACK: Back symmetric, no curvature., No CVA tenderness  EXTREMITIES:less then 2 second capillary refill, no joint deformities, effusion, or inflammation, no edema, no skin discoloration, no clubbing, no cyanosis  NEURO: alert & oriented x 3 with fluent speech, no focal motor/sensory deficits, gait normal   LABORATORY DATA: CBC    Component Value Date/Time   WBC 4.3 02/26/2012 0922   RBC 4.09 02/26/2012  0922   HGB 13.2 02/26/2012 0922   HCT 38.3 02/26/2012 0922   PLT 197 02/26/2012 0922   MCV 93.6 02/26/2012 0922   MCH 32.3 02/26/2012 0922   MCHC 34.5 02/26/2012 0922   RDW 14.8 02/26/2012 0922   LYMPHSABS 0.6* 02/26/2012 0922   MONOABS 0.2 02/26/2012 0922   EOSABS 0.0 02/26/2012 0922   BASOSABS 0.0 02/26/2012 0922      Chemistry      Component Value Date/Time   NA 139 02/26/2012 0922   K 3.8 02/26/2012 0922   CL 103 02/26/2012 0922   CO2 24 02/26/2012 0922   BUN 19 02/26/2012 0922   CREATININE 0.75 02/26/2012 0922   CREATININE 0.64 10/29/2011 1000      Component Value Date/Time   CALCIUM 10.0 02/26/2012 0922   ALKPHOS 67 02/26/2012 0922   AST 20 02/26/2012 0922   ALT 22 02/26/2012 0922   BILITOT 0.3 02/26/2012 0922     Results for LINNAEA, AHN (MRN [id number]) as of 05/04/2012 13:37  Ref. Range 03/03/2012 11:40  Hemoglobin A1C Latest Range: <  5.7 % 5.9 (H)      RADIOGRAPHIC STUDIES:  12/16/11  *RADIOLOGY REPORT*  Clinical Data: The patient underwent left mastectomy for breast  cancer years ago and right lumpectomy with radiation therapy for  breast cancer in 2013.  DIGITAL DIAGNOSTIC RIGHT MAMMOGRAM WITH CAD  Comparison: 01/07/2011, 12/11/2010, 12/05/2009  Findings: The breast tissue is almost entirely fatty. Right  lumpectomy changes are present in the right upper inner quadrant.  There is no suspicious dominant mass, nonsurgical architectural  distortion or calcification to suggest malignancy.  Mammographic images were processed with CAD.  IMPRESSION:  No mammographic evidence of malignancy.  RECOMMENDATION:  Yearly diagnostic mammography is suggested.  I have discussed the findings and recommendations with the patient.  Results were also provided in writing at the conclusion of the  visit.  BI-RADS CATEGORY 2: Benign finding(s).  Original Report Authenticated By: Cain Saupe, M.D.     ASSESSMENT:  1. 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 Claud Kelp on 02/13/2011 followed by sentinel node biopsy which was negative. Her Ki-67 marker was high at 54% HER-2/neu was nonamplified and she was ER/PR negative. She is now status post 6 cycles of carboplatin and Taxotere and S/P radiation. No evidence of recurrence.  2. BRCA1 positivity. She anticipates following up with Dr.Cousins for consideration of oophorectomy  3. History of left-sided breast cancer stage II grade 3 triple negative at that time as well with 7 negative nodes but a 2.3 cm primary with mastectomy on 11/27/1997. She was treated with AC. x4 cycles followed by Taxol x4 cycles thus far without recurrence.  4. Right breast fibroglandular tissue with tenderness. Negative mammogram in November 2013.   PLAN:  1. I personally reviewed and went over laboratory results with the patient. 2. I personally reviewed and went over radiographic studies with the patient. 3. Labs in 6 months: CBC diff, CMET 4. Discussion regarding weight loss 5. Recommended she follow-up with Dr. Cherly Hensen regarding the option of oophorectomy due to her BRCA1 positivity. 6. Mammogram is due in November 2014 7. Return in 6 months  All questions were answered. The patient knows to call the clinic with any problems, questions or concerns. We can certainly see the patient much sooner if necessary.  The patient and plan discussed with Glenford Peers, MD and he is in agreement with the aforementioned.  KEFALAS,THOMAS

## 2012-06-30 ENCOUNTER — Encounter: Payer: Self-pay | Admitting: Family Medicine

## 2012-06-30 ENCOUNTER — Ambulatory Visit (INDEPENDENT_AMBULATORY_CARE_PROVIDER_SITE_OTHER): Payer: BC Managed Care – PPO | Admitting: Family Medicine

## 2012-06-30 VITALS — BP 106/70 | HR 72 | Resp 18 | Ht 65.0 in | Wt 293.1 lb

## 2012-06-30 DIAGNOSIS — E785 Hyperlipidemia, unspecified: Secondary | ICD-10-CM

## 2012-06-30 DIAGNOSIS — M25569 Pain in unspecified knee: Secondary | ICD-10-CM

## 2012-06-30 DIAGNOSIS — J209 Acute bronchitis, unspecified: Secondary | ICD-10-CM

## 2012-06-30 DIAGNOSIS — M25562 Pain in left knee: Secondary | ICD-10-CM

## 2012-06-30 DIAGNOSIS — E8881 Metabolic syndrome: Secondary | ICD-10-CM

## 2012-06-30 DIAGNOSIS — R7309 Other abnormal glucose: Secondary | ICD-10-CM

## 2012-06-30 DIAGNOSIS — R7303 Prediabetes: Secondary | ICD-10-CM

## 2012-06-30 LAB — HEMOGLOBIN A1C
Hgb A1c MFr Bld: 6.3 % — ABNORMAL HIGH
Mean Plasma Glucose: 134 mg/dL — ABNORMAL HIGH

## 2012-06-30 MED ORDER — METFORMIN HCL 500 MG PO TABS: 500.0000 mg | ORAL_TABLET | Freq: Two times a day (BID) | ORAL | Status: DC

## 2012-06-30 MED ORDER — FLUCONAZOLE 150 MG PO TABS: ORAL_TABLET | ORAL | Status: AC

## 2012-06-30 MED ORDER — AZITHROMYCIN 250 MG PO TABS: ORAL_TABLET | ORAL | Status: AC

## 2012-06-30 MED ORDER — PHENTERMINE HCL 37.5 MG PO TABS: 37.5000 mg | ORAL_TABLET | Freq: Every day | ORAL | Status: DC

## 2012-06-30 NOTE — Progress Notes (Signed)
  Subjective:    Patient ID: Chauntelle Azpeitia, female    DOB: [birth year], 52 y.o.   MRN: [id number]  HPI The PT is here for follow up and re-evaluation of chronic medical conditions, medication management and review of any available recent lab and radiology data.  Preventive health is updated, specifically  Cancer screening and Immunization.   Questions or concerns regarding consultations or procedures which the PT has had in the interim are  addresse. 5 day h/o progressive post nasal drainage and sinus pressure, as well as cough productive of yellow sputum Alos c/o increased weight, wants help with weight loss , has worsening knee pain , has been advised of need for surgery but is hesitant      Review of Systems See HPI Denies recent fever, has had r chills. . Denies chest pains, palpitations and leg swelling Denies abdominal pain, nausea, vomiting,diarrhea or constipation.   Denies dysuria, frequency, hesitancy or incontinence.  Denies headaches, seizures, numbness, or tingling. Denies depression, anxiety or insomnia. Denies skin break down or rash.        Objective:   Physical Exam  Patient alert and oriented and in no cardiopulmonary distress.  HEENT: No facial asymmetry, EOMI, no sinus tenderness,  oropharynx pink and moist.  Neck supple no adenopathy.  Chest: Scattered crackles, no wheezes CVS: S1, S2 no murmurs, no S3.  ABD: Soft non tender. Bowel sounds normal.  Ext: No edema  MS: Adequate ROM spine, shoulders, hips and reduced in  Knees , left greater than right  Skin: Intact, no ulcerations or rash noted.  Psych: Good eye contact, normal affect. Memory intact not anxious or depressed appearing.  CNS: CN 2-12 intact, power, tone and sensation normal throughout.       Assessment & Plan:

## 2012-06-30 NOTE — Patient Instructions (Addendum)
F/u in 4 month  Weight loss goal of 12 to 16 pounds   HBA1C today.  Zpack prescribed for cough, and you will get fluconazole script to keep in case you develop vaginal itch after the antibiotic  New is metformin one twice daily to help with blood sugar and weight management   Start phentermine half tablet daily  You will get 1500 cal diet sheet also, measuring food quantity is very important

## 2012-07-02 DIAGNOSIS — E8881 Metabolic syndrome: Secondary | ICD-10-CM | POA: Insufficient documentation

## 2012-07-02 NOTE — Assessment & Plan Note (Signed)
Deteriorated. Pt to start metformin as well as work aggressively on weight loss

## 2012-07-02 NOTE — Assessment & Plan Note (Signed)
Worsening , has been advised of need for surgery, delaying this as much as possible

## 2012-07-02 NOTE — Assessment & Plan Note (Signed)
Pt to use decongestant and z pack prescribed

## 2012-07-02 NOTE — Assessment & Plan Note (Signed)
Hyperlipidemia:Low fat diet discussed and encouraged.  Updated lab next visit 

## 2012-07-02 NOTE — Assessment & Plan Note (Signed)
Pt made aware of increased CV risk and need to reduce this by lifestyle change and weight loss

## 2012-07-02 NOTE — Assessment & Plan Note (Addendum)
Deteriorated. Patient re-educated about  the importance of commitment to a  minimum of 150 minutes of exercise per week. The importance of healthy food choices with portion control discussed. Encouraged to start a food diary, count calories and to consider  joining a support group. Sample diet sheets offered. Goals set by the patient for the next several months.   Start phentermine half daily 

## 2012-08-24 ENCOUNTER — Ambulatory Visit (HOSPITAL_COMMUNITY): Payer: Medicaid Other | Admitting: Oncology

## 2012-09-29 ENCOUNTER — Encounter (HOSPITAL_COMMUNITY): Payer: BC Managed Care – PPO | Attending: Oncology | Admitting: Oncology

## 2012-09-29 ENCOUNTER — Encounter (HOSPITAL_COMMUNITY): Payer: Self-pay | Admitting: Oncology

## 2012-09-29 VITALS — BP 129/73 | HR 86 | Temp 96.5°F | Resp 18 | Wt 292.7 lb

## 2012-09-29 DIAGNOSIS — Z853 Personal history of malignant neoplasm of breast: Secondary | ICD-10-CM

## 2012-09-29 DIAGNOSIS — C50911 Malignant neoplasm of unspecified site of right female breast: Secondary | ICD-10-CM

## 2012-09-29 DIAGNOSIS — Z1501 Genetic susceptibility to malignant neoplasm of breast: Secondary | ICD-10-CM

## 2012-09-29 DIAGNOSIS — C50419 Malignant neoplasm of upper-outer quadrant of unspecified female breast: Secondary | ICD-10-CM

## 2012-09-29 NOTE — Patient Instructions (Addendum)
[facility] Discharge Instructions  RECOMMENDATIONS MADE BY THE CONSULTANT AND ANY TEST RESULTS WILL BE SENT TO YOUR REFERRING PHYSICIAN.  Exam good today. Keep your upcoming appointment with Dr.Cousins. Mammogram November 2014. From there we will alternate MRI breast and Mammogram every 6 months. MD appointment again in 6 months. Report any issues/concerns to clinic as needed prior to appointments.  Thank you for choosing [facility] to provide your oncology and hematology care.  To afford each patient quality time with our providers, please arrive at least 15 minutes before your scheduled appointment time.  With your help, our goal is to use those 15 minutes to complete the necessary work-up to ensure our physicians have the information they need to help with your evaluation and healthcare recommendations.    Effective January 1st, 2014, we ask that you re-schedule your appointment with our physicians should you arrive 10 or more minutes late for your appointment.  We strive to give you quality time with our providers, and arriving late affects you and other patients whose appointments are after yours.    Again, thank you for choosing [facility].  Our hope is that these requests will decrease the amount of time that you wait before being seen by our physicians.       _____________________________________________________________  Should you have questions after your visit to [facility], please contact our office at [phone] between the hours of 8:30 a.m. and 5:00 p.m.  Voicemails left after 4:30 p.m. will not be returned until the following business day.  For prescription refill requests, have your pharmacy contact our office with your prescription refill request.

## 2012-09-29 NOTE — Progress Notes (Signed)
Kathleen Overman, MD [address] [place] [address]  Invasive ductal carcinoma of breast, stage 1, right  BRCA1 positive  CURRENT THERAPY:Observation and surveillance   INTERVAL HISTORY: Kathleen Reynolds 52 y.o. 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 Claud Kelp on 02/13/2011 followed by sentinel node biopsy which was negative. Her Ki-67 marker was high at 54% HER-2/neu was nonamplified and she was ER/PR negative. She is now status post 6 cycles of carboplatin and Taxotere (04/03/11- 07/16/11) and S/P radiation finishing in August 2013. AND History of left-sided breast cancer stage II grade 3 triple negative at that time as well with 7 negative nodes but a 2.3 cm primary with mastectomy on 11/27/1997. She was treated with AC. x4 cycles followed by Taxol x4 cycles thus far without recurrence.  Kathleen Reynolds is doing well.  She notes a right sided, lateral, breast discomfort, just superior to infra-mammary fold.  She reports that she notices it only when she touches the area.  She denies any breast disfiguring or nipple changes.    She does have an appointment with Dr. Cherly Hensen (Gynecologist) for consideration of oophorectomy due to her BRCA 1 and increased risk of breast cancer.    Due to this increased risk, screening modality will be more aggressive with Mammogram yearly and MRI breast yearly alternating so a test is being performed every 6 months.   She feels great otherwise, and oncology ROS questioning is negative.     Past Medical History  Diagnosis Date  . Migraines   . Breast mass in female     right  . Arthritis   . Hyperlipidemia   . Breast cancer     Right  . Left rotator cuff tear   . Morbid obesity   . Anemia LIFELONG    HEAVY MENSES MAR 2012 HB 13 MCV 86.7 FERRITIN  66    has NEOPLASM, MALIGNANT, BREAST, LEFT; Thyroid nodule;  DYSLIPIDEMIA; HIP PAIN, RIGHT; RUPTURE ROTATOR CUFF; FATIGUE; HEADACHE; INGUINAL PAIN, RIGHT; Screening for colon cancer; History of iron deficiency anemia; Prediabetes; Colonic adenoma; Invasive ductal carcinoma of right breast, stage 1; BRCA1 positive; Knee pain, left; OA (osteoarthritis) of knee; Anemia; Obesity, morbid, BMI 40.0-49.9; Acute bronchitis; and Metabolic syndrome X on her problem list.     is allergic to ibuprofen.  Kathleen Reynolds had no medications administered during this visit.  Past Surgical History  Procedure Laterality Date  . Dilation and curettage of uterus    . Colonoscopy  2004 DR. SMITH    No polyp, hemorrhoids  . Colonoscopy  06/09/10    XBJ:YNWGNF adenomas   . Hystersonogram  2009  . Dilation and curettage of uterus  05/2002  . Mastectomy  1999    left breast cancer   . Breast surgery  11/1997    Left breast cancer-mastectomy  . Breast biopsy  02/13/2011    Procedure: BREAST BIOPSY WITH NEEDLE LOCALIZATION;  Surgeon: Ernestene Mention, MD;  Location: [facility];  Service: General;  Laterality: Right;  . Breast surgery  2/13-    rt axilly bx  . Portacath placement  03/27/2011    Procedure: INSERTION PORT-A-CATH;  Surgeon: Ernestene Mention, MD;  Location: [facility];  Service: General;  Laterality: Left;  insert port a cath  . Port-a-cath removal  10/20/2011    Procedure: MINOR REMOVAL  PORT-A-CATH;  Surgeon: Ernestene Mention, MD;  Location:  S[facility];  Service: General;  Laterality: N/A;  Porta-cath removal  left    Denies any headaches, dizziness, double vision, fevers, chills, night sweats, nausea, vomiting, diarrhea, constipation, chest pain, heart palpitations, shortness of breath, blood in stool, black tarry stool, urinary pain, urinary burning, urinary frequency, hematuria.   PHYSICAL EXAMINATION  ECOG PERFORMANCE STATUS: 0 - Asymptomatic  Filed Vitals:   09/29/12 1200  BP: 129/73  Pulse: 86  Temp: 96.5 F  (35.8 C)  Resp: 18    GENERAL:alert, no distress, well nourished, well developed, comfortable, cooperative, morbidly obese and smiling  SKIN: skin color, texture, turgor are normal, no rashes or significant lesions  HEAD: Normocephalic, No masses, lesions, tenderness or abnormalities  EYES: normal, Conjunctiva are pink and non-injected  EARS: External ears normal  OROPHARYNX:lips, buccal mucosa, and tongue normal and mucous membranes are moist  NECK: supple, no adenopathy, thyroid normal size, non-tender, without nodularity, no stridor, non-tender, trachea midline  LYMPH: no palpable lymphadenopathy, no hepatosplenomegaly  BREAST:right breast normal without mass, skin or nipple changes or axillary nodes, hyperpigmentation and erythema secondary to radiation with a small fibrotic area in the 7-8 o'clock position. Left post-mastectomy site well healed and free of suspicious changes. LUNGS: clear to auscultation and percussion  HEART: regular rate & rhythm, no murmurs, no gallops, S1 normal and S2 normal  ABDOMEN:abdomen soft, non-tender, morbidly obese and normal bowel sounds  BACK: Back symmetric, no curvature., No CVA tenderness  EXTREMITIES:less then 2 second capillary refill, no joint deformities, effusion, or inflammation, no edema, no skin discoloration, no clubbing, no cyanosis  NEURO: alert & oriented x 3 with fluent speech, no focal motor/sensory deficits, gait normal     LABORATORY DATA: CBC    Component Value Date/Time   WBC 4.3 02/26/2012 0922   RBC 4.09 02/26/2012 0922   HGB 13.2 02/26/2012 0922   HCT 38.3 02/26/2012 0922   PLT 197 02/26/2012 0922   MCV 93.6 02/26/2012 0922   MCH 32.3 02/26/2012 0922   MCHC 34.5 02/26/2012 0922   RDW 14.8 02/26/2012 0922   LYMPHSABS 0.6* 02/26/2012 0922   MONOABS 0.2 02/26/2012 0922   EOSABS 0.0 02/26/2012 0922   BASOSABS 0.0 02/26/2012 0922      Chemistry      Component Value Date/Time   NA 139 02/26/2012 0922   K 3.8 02/26/2012 0922   CL  103 02/26/2012 0922   CO2 24 02/26/2012 0922   BUN 19 02/26/2012 0922   CREATININE 0.75 02/26/2012 0922   CREATININE 0.64 10/29/2011 1000      Component Value Date/Time   CALCIUM 10.0 02/26/2012 0922   ALKPHOS 67 02/26/2012 0922   AST 20 02/26/2012 0922   ALT 22 02/26/2012 0922   BILITOT 0.3 02/26/2012 0922        ASSESSMENT:  1. 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 Claud Kelp on 02/13/2011 followed by sentinel node biopsy which was negative. Her Ki-67 marker was high at 54% HER-2/neu was nonamplified and she was ER/PR negative. She is now status post 6 cycles of carboplatin and Taxotere and S/P radiation. No evidence of recurrence.  2. BRCA1 positivity. She anticipates following up with Dr. Cherly Hensen for consideration of oophorectomy  3. History of left-sided breast cancer stage II grade 3 triple negative at that time as well with 7 negative nodes but a 2.3 cm primary with  mastectomy on 11/27/1997. She was treated with AC. x4 cycles followed by Taxol x4 cycles thus far without recurrence.  4. Right breast fibroglandular tissue with tenderness. Negative mammogram in November 2013.   PLAN:  1. I personally reviewed and went over laboratory results with the patient.  2. I personally reviewed and went over radiographic studies with the patient.  3. Labs in 6 months: CBC diff, CMET  4. Discussion regarding weight loss  5. Recommended she follow-up with Dr. Cherly Hensen regarding the option of oophorectomy due to her BRCA1 positivity.  6. Mammogram is due in November 2014  7. Will alternate MRI of breast with mammogram every 6 months.  8. Return in 6 months    THERAPY PLAN:  I will follow the NCCN guidelines recommendations for increased risk breast cancer patient due to her BRCA 1 positivity and history of 2 breast cancers at the age of 37.  Will perform yearly mammogram and MRI breast alternating so a screening test will be  performed every 6 months.   All questions were answered. The patient knows to call the clinic with any problems, questions or concerns. We can certainly see the patient much sooner if necessary.  Patient and plan discussed with Dr. Erline Hau and he is in agreement with the aforementioned.   KEFALAS,THOMAS

## 2012-10-06 ENCOUNTER — Telehealth (HOSPITAL_COMMUNITY): Payer: Self-pay | Admitting: Oncology

## 2012-10-31 ENCOUNTER — Encounter: Payer: Self-pay | Admitting: Family Medicine

## 2012-10-31 ENCOUNTER — Ambulatory Visit (INDEPENDENT_AMBULATORY_CARE_PROVIDER_SITE_OTHER): Payer: BC Managed Care – PPO | Admitting: Family Medicine

## 2012-10-31 VITALS — BP 122/78 | HR 68 | Resp 16 | Ht 65.0 in | Wt 293.1 lb

## 2012-10-31 DIAGNOSIS — E785 Hyperlipidemia, unspecified: Secondary | ICD-10-CM

## 2012-10-31 DIAGNOSIS — R7303 Prediabetes: Secondary | ICD-10-CM

## 2012-10-31 DIAGNOSIS — E041 Nontoxic single thyroid nodule: Secondary | ICD-10-CM

## 2012-10-31 DIAGNOSIS — R51 Headache: Secondary | ICD-10-CM

## 2012-10-31 DIAGNOSIS — R7309 Other abnormal glucose: Secondary | ICD-10-CM

## 2012-10-31 MED ORDER — MECLIZINE HCL 25 MG PO TABS: 25.0000 mg | ORAL_TABLET | Freq: Three times a day (TID) | ORAL | Status: DC | PRN

## 2012-10-31 MED ORDER — METFORMIN HCL 500 MG PO TABS: 500.0000 mg | ORAL_TABLET | Freq: Two times a day (BID) | ORAL | Status: DC

## 2012-10-31 MED ORDER — FLUTICASONE PROPIONATE 50 MCG/ACT NA SUSP: 2.0000 | Freq: Every day | NASAL | Status: DC

## 2012-10-31 NOTE — Assessment & Plan Note (Signed)
Needs f/u in January 2015 with ENT who is now following the problem

## 2012-10-31 NOTE — Assessment & Plan Note (Signed)
Infrequent, no more often than once per week  requests refill on fioricet so she can have this available when needed

## 2012-10-31 NOTE — Assessment & Plan Note (Signed)
Deteriorated. Patient re-educated about  the importance of commitment to a  minimum of 150 minutes of exercise per week. The importance of healthy food choices with portion control discussed. Encouraged to start a food diary, count calories and to consider  joining a support group. Sample diet sheets offered. Goals set by the patient for the next several months.    

## 2012-10-31 NOTE — Progress Notes (Signed)
  Subjective:    Patient ID: Kathleen Reynolds, female    DOB: 1961-01-15, 52 y.o.   MRN: [id number]  HPI The PT is here for follow up and re-evaluation of chronic medical conditions, medication management and review of any available recent lab and radiology data.  Preventive health is updated, specifically  Cancer screening and Immunization.   Questions or concerns regarding consultations or procedures which the PT has had in the interim are  addressed. The PT denies any adverse reactions to current medications since the last visit.  Somewhat frustrated with lack of weight loss despite increased exercise and reported reduced oral intake, clearly needs more consistency in both areas as she has weight gain    Review of Systems See HPI Denies recent fever or chills. Increased allergy symptoms with change in season Denies chest congestion, productive cough or wheezing. Denies chest pains, palpitations and leg swelling Denies abdominal pain, nausea, vomiting,diarrhea or constipation.   Denies dysuria, frequency, hesitancy or incontinence. Denies joint pain, swelling and limitation in mobility. C/o intermittent and less frequent  Headaches,denies  seizures, numbness, or tingling. Currently grieving recent loss of ailing aunt Denies skin break down or rash.        Objective:   Physical Exam  Patient alert and oriented and in no cardiopulmonary distress.  HEENT: No facial asymmetry, EOMI, no sinus tenderness,  oropharynx pink and moist.  Neck supple no adenopathy.  Chest: Clear to auscultation bilaterally.  CVS: S1, S2 no murmurs, no S3.  ABD: Soft non tender. Bowel sounds normal.  Ext: No edema  MS: Adequate ROM spine, shoulders, hips and knees.  Skin: Intact, no ulcerations or rash noted.  Psych: Good eye contact, normal affect. Memory intact not anxious or depressed appearing.  CNS: CN 2-12 intact, power, tone and sensation normal throughout.       Assessment &  Plan:

## 2012-10-31 NOTE — Patient Instructions (Addendum)
F/u in 4 month, call if you need me before  Fasting lipid, chem 7, HBa1C, TSH this week result note will be sent to you, pls respond  Weight loss goal of 2 to 3 pounds per month  It is important that you exercise regularly at least 30 minutes 5 times a week. If you develop chest pain, have severe difficulty breathing, or feel very tired, stop exercising immediately and seek medical attention    A healthy diet is rich in fruit, vegetables and whole grains. Poultry fish, nuts and beans are a healthy choice for protein rather then red meat. A low sodium diet and drinking 64 ounces of water daily is generally recommended. Oils and sweet should be limited. Carbohydrates especially for those who are diabetic or overweight, should be limited to 45 to 60 gram per meal. It is important to eat on a regular schedule, at least 3 times daily. Snacks should be primarily fruits, vegetables or nuts.   Non fasting HBA1C, CBC, and chem 7 in 4 months, before next visit please

## 2012-10-31 NOTE — Assessment & Plan Note (Addendum)
Patient educated about the importance of limiting  Carbohydrate intake , the need to commit to daily physical activity for a minimum of 30 minutes , and to commit weight loss. The fact that changes in all these areas will reduce or eliminate all together the development of diabetes is stressed.   Updated lab this week Start metformin

## 2012-10-31 NOTE — Assessment & Plan Note (Signed)
Hyperlipidemia:Low fat diet discussed and encouraged.  Updated lab this week 

## 2012-11-03 LAB — BASIC METABOLIC PANEL
BUN: 13 mg/dL (ref 6–23)
CO2: 25 mEq/L (ref 19–32)
Calcium: 9.4 mg/dL (ref 8.4–10.5)
Glucose, Bld: 91 mg/dL (ref 70–99)

## 2012-11-03 LAB — LIPID PANEL
Cholesterol: 194 mg/dL (ref 0–200)
HDL: 44 mg/dL (ref >39)
Total CHOL/HDL Ratio: 4.4 Ratio

## 2012-11-03 LAB — HEMOGLOBIN A1C: Hgb A1c MFr Bld: 6.3 % — ABNORMAL HIGH (ref <5.7)

## 2012-11-04 ENCOUNTER — Encounter: Payer: Self-pay | Admitting: Family Medicine

## 2012-11-10 ENCOUNTER — Other Ambulatory Visit (HOSPITAL_COMMUNITY): Payer: Medicaid Other

## 2012-11-14 ENCOUNTER — Other Ambulatory Visit: Payer: Self-pay | Admitting: Family Medicine

## 2012-11-14 DIAGNOSIS — C50911 Malignant neoplasm of unspecified site of right female breast: Secondary | ICD-10-CM

## 2012-11-17 ENCOUNTER — Ambulatory Visit (HOSPITAL_COMMUNITY): Payer: Medicaid Other | Admitting: Oncology

## 2012-12-21 ENCOUNTER — Encounter (HOSPITAL_COMMUNITY): Payer: BC Managed Care – PPO

## 2012-12-21 ENCOUNTER — Ambulatory Visit (HOSPITAL_COMMUNITY)
Admission: RE | Admit: 2012-12-21 | Discharge: 2012-12-21 | Disposition: A | Discharge status: Home / Self Care | Payer: BC Managed Care – PPO | Source: Ambulatory Visit | Attending: Family Medicine | Admitting: Family Medicine

## 2012-12-21 DIAGNOSIS — Z9010 Acquired absence of unspecified breast and nipple: Secondary | ICD-10-CM | POA: Insufficient documentation

## 2012-12-21 DIAGNOSIS — C50911 Malignant neoplasm of unspecified site of right female breast: Secondary | ICD-10-CM

## 2012-12-21 DIAGNOSIS — Z853 Personal history of malignant neoplasm of breast: Secondary | ICD-10-CM | POA: Insufficient documentation

## 2013-01-16 ENCOUNTER — Other Ambulatory Visit (INDEPENDENT_AMBULATORY_CARE_PROVIDER_SITE_OTHER): Payer: Self-pay | Admitting: Otolaryngology

## 2013-01-16 DIAGNOSIS — R221 Localized swelling, mass and lump, neck: Secondary | ICD-10-CM

## 2013-01-19 ENCOUNTER — Ambulatory Visit (HOSPITAL_COMMUNITY)
Admission: RE | Admit: 2013-01-19 | Discharge: 2013-01-19 | Disposition: A | Discharge status: Home / Self Care | Payer: BC Managed Care – PPO | Source: Ambulatory Visit | Attending: Otolaryngology | Admitting: Otolaryngology

## 2013-01-19 DIAGNOSIS — R221 Localized swelling, mass and lump, neck: Secondary | ICD-10-CM

## 2013-01-19 DIAGNOSIS — E042 Nontoxic multinodular goiter: Secondary | ICD-10-CM | POA: Insufficient documentation

## 2013-02-23 ENCOUNTER — Ambulatory Visit (INDEPENDENT_AMBULATORY_CARE_PROVIDER_SITE_OTHER): Payer: BC Managed Care – PPO

## 2013-02-23 ENCOUNTER — Ambulatory Visit (INDEPENDENT_AMBULATORY_CARE_PROVIDER_SITE_OTHER): Payer: BC Managed Care – PPO | Admitting: Orthopedic Surgery

## 2013-02-23 ENCOUNTER — Encounter: Payer: Self-pay | Admitting: Orthopedic Surgery

## 2013-02-23 VITALS — BP 126/80 | Ht 64.0 in | Wt 293.0 lb

## 2013-02-23 DIAGNOSIS — M7662 Achilles tendinitis, left leg: Principal | ICD-10-CM

## 2013-02-23 DIAGNOSIS — M79609 Pain in unspecified limb: Secondary | ICD-10-CM

## 2013-02-23 DIAGNOSIS — M79671 Pain in right foot: Secondary | ICD-10-CM

## 2013-02-23 DIAGNOSIS — M79672 Pain in left foot: Principal | ICD-10-CM

## 2013-02-23 DIAGNOSIS — M7660 Achilles tendinitis, unspecified leg: Secondary | ICD-10-CM

## 2013-02-23 DIAGNOSIS — M7661 Achilles tendinitis, right leg: Secondary | ICD-10-CM | POA: Insufficient documentation

## 2013-02-23 MED ORDER — NABUMETONE 500 MG PO TABS: 500.0000 mg | ORAL_TABLET | Freq: Two times a day (BID) | ORAL | Status: DC

## 2013-02-23 NOTE — Progress Notes (Signed)
Patient ID: Kathleen Reynolds, female   DOB: 09-30-1960, 53 y.o.   MRN: [id number] Chief Complaint  Patient presents with  . Foot Pain    Bilateral foot pain, no injuries    History this is a 53 year old female complains of bilateral heel pain left greater than right with no evidence of trauma. She got a cortisone shot in her heel 3 years ago did well up until 2 months ago. She complains of throbbing pain in the back of the heel which has become constant and interferes with her daily activities including walking her pain level is 7/10 her symptoms are not relieved by Aleve  Review of systems weight gain is water somewhat she also has some palpitations, shortness of breath and snoring she has some numbness and tingling related to diabetic neuropathy and some easy bruising the remaining systems reviewed were inclusive and were negative  BP 126/80  Ht 5\' 4"  (1.626 m)  Wt 293 lb (132.904 kg)  BMI 50.27 kg/m2  LMP 03/27/2011 General appearance is normal, the patient is alert and oriented x3 with normal mood and affect. She is ambulatory without assistive device she's gone to an open packed slightly elevated shoe which is good  She is tenderness and swelling in the posterior aspects of both calcanei with tenderness in the Achilles tendon left greater than right she has normal stability at the ankle joint And the motor function aound the ankle is normal. The skin is intact she is a good pulser there is no evidence of pathologic reflexes or lymphangitis  X-ray shows large Achilles spur right greater than left Recommend continue current shoe wear, start physical therapy changed to nabumetone followup 6 weeks

## 2013-02-23 NOTE — Patient Instructions (Addendum)
Achilles Tendinitis Achilles tendinitis is inflammation of the tough, cord-like band that attaches the lower muscles of your leg to your heel (Achilles tendon). It is usually caused by overusing the tendon and joint involved.  CAUSES Achilles tendinitis can happen because of:  A sudden increase in exercise or activity (such as running).  Doing the same exercises or activities (such as jumping) over and over.  Not warming up calf muscles before exercising.  Exercising in shoes that are worn out or not made for exercise.  Having arthritis or a bone growth on the back of the heel bone. This can rub against the tendon and hurt the tendon. SIGNS AND SYMPTOMS The most common symptoms are:  Pain in the back of the leg, just above the heel. The pain usually gets worse with exercise and better with rest.  Stiffness or soreness in the back of the leg, especially in the morning.  Swelling of the skin over the Achilles tendon.  Trouble standing on tiptoe. Sometimes, an Achilles tendon tears (ruptures). Symptoms of an Achilles tendon rupture can include:  Sudden, severe pain in the back of the leg.  Trouble putting weight on the foot or walking normally. DIAGNOSIS Achilles tendinitis will be diagnosed based on symptoms and a physical examination. An X-ray may be done to check if another condition is causing your symptoms. An MRI may be ordered if your health care provider suspects you may have completely torn your tendon, which is called an Achilles tendon rupture.  TREATMENT  Achilles tendinitis usually gets better over time. It can take weeks to months to heal completely. Treatment focuses on treating the symptoms and helping the injury heal. HOME CARE INSTRUCTIONS   Rest your Achilles tendon and avoid activities that cause pain.  Apply ice to the injured area:  Put ice in a plastic bag.  Place a towel between your skin and the bag.  Leave the ice on for 20 minutes, 2 3 times a  day  Try to avoid using the tendon (other than gentle range of motion) while the tendon is painful. Do not resume use until instructed by your health care provider. Then begin use gradually. Do not increase use to the point of pain. If pain does develop, decrease use and continue the above measures. Gradually increase activities that do not cause discomfort until you achieve normal use.  Do exercises to make your calf muscles stronger and more flexible. Your health care provider or physical therapist can recommend exercises for you to do.  Wrap your ankle with an elastic bandage or other wrap. This can help keep your tendon from moving too much. Your health care provider will show you how to wrap your ankle correctly.  Only take over-the-counter or prescription medicines for pain, discomfort, or fever as directed by your health care provider. SEEK MEDICAL CARE IF:   Your pain and swelling increase or pain is uncontrolled with medicines.  You develop new, unexplained symptoms or your symptoms get worse.  You are unable to move your toes or foot.  You develop warmth and swelling in your foot.  You have an unexplained temperature. MAKE SURE YOU:   Understand these instructions.  Will watch your condition.  Will get help right away if you are not doing well or get worse. Document Released: 10/29/2004 Document Revised: 11/09/2012 Document Reviewed: 08/31/2012 [facility] Patient Information 2014 [place].   Stop aleve , start nabumetone 500 mg twice a day  Continue current shoe wear  Start  physical therapy

## 2013-03-02 ENCOUNTER — Ambulatory Visit (HOSPITAL_COMMUNITY)
Admission: RE | Admit: 2013-03-02 | Discharge: 2013-03-02 | Disposition: A | Discharge status: Home / Self Care | Payer: BC Managed Care – PPO | Source: Ambulatory Visit | Attending: Orthopedic Surgery | Admitting: Orthopedic Surgery

## 2013-03-02 ENCOUNTER — Encounter: Payer: Self-pay | Admitting: Family Medicine

## 2013-03-02 ENCOUNTER — Ambulatory Visit (INDEPENDENT_AMBULATORY_CARE_PROVIDER_SITE_OTHER): Payer: BC Managed Care – PPO | Admitting: Family Medicine

## 2013-03-02 VITALS — BP 130/80 | HR 90 | Resp 16 | Ht 65.0 in | Wt 295.8 lb

## 2013-03-02 DIAGNOSIS — E8881 Metabolic syndrome: Secondary | ICD-10-CM

## 2013-03-02 DIAGNOSIS — R7309 Other abnormal glucose: Secondary | ICD-10-CM

## 2013-03-02 DIAGNOSIS — R51 Headache: Secondary | ICD-10-CM

## 2013-03-02 DIAGNOSIS — M25676 Stiffness of unspecified foot, not elsewhere classified: Secondary | ICD-10-CM

## 2013-03-02 DIAGNOSIS — M25673 Stiffness of unspecified ankle, not elsewhere classified: Secondary | ICD-10-CM

## 2013-03-02 DIAGNOSIS — R7303 Prediabetes: Secondary | ICD-10-CM

## 2013-03-02 DIAGNOSIS — E785 Hyperlipidemia, unspecified: Secondary | ICD-10-CM

## 2013-03-02 DIAGNOSIS — M25579 Pain in unspecified ankle and joints of unspecified foot: Secondary | ICD-10-CM

## 2013-03-02 LAB — HEMOGLOBIN A1C
Hgb A1c MFr Bld: 6 % — ABNORMAL HIGH (ref <5.7)
MEAN PLASMA GLUCOSE: 126 mg/dL — AB (ref <117)

## 2013-03-02 MED ORDER — BUTALBITAL-APAP-CAFFEINE 50-325-40 MG PO TABS: 1.0000 | ORAL_TABLET | Freq: Two times a day (BID) | ORAL | Status: DC | PRN

## 2013-03-02 MED ORDER — PHENTERMINE HCL 37.5 MG PO CAPS: 37.5000 mg | ORAL_CAPSULE | ORAL | Status: DC

## 2013-03-02 NOTE — Evaluation (Signed)
Physical Therapy Evaluation  Patient Details  Name: Kathleen Reynolds MRN: [id number] Date of Birth: [birth year]  Today's Date: 03/02/2013 Time: 1530-1610 PT Time Calculation (min): 40 min  Charge:  Evaluation 1530-1600; manual 1600-1610            Visit#: 1 of 8  Re-eval: 04/01/13 Assessment Diagnosis: B achilles tendoinitis Next MD Visit: 04/06/2013  Authorization: Lorella Nimrod     Past Medical History:  Past Medical History  Diagnosis Date  . Migraines   . Breast mass in female     right  . Arthritis   . Hyperlipidemia   . Breast cancer     Right  . Left rotator cuff tear   . Morbid obesity   . Anemia LIFELONG    HEAVY MENSES MAR 2012 HB 13 MCV 86.7 FERRITIN  66   Past Surgical History:  Past Surgical History  Procedure Laterality Date  . Dilation and curettage of uterus    . Colonoscopy  2004 DR. SMITH    No polyp, hemorrhoids  . Colonoscopy  06/09/10    IRW:ERXVQM adenomas   . Hystersonogram  2009  . Dilation and curettage of uterus  05/2002  . Mastectomy  1999    left breast cancer   . Breast surgery  11/1997    Left breast cancer-mastectomy  . Breast biopsy  02/13/2011    Procedure: BREAST BIOPSY WITH NEEDLE LOCALIZATION;  Surgeon: Adin Hector, MD;  Location: [place];  Service: General;  Laterality: Right;  . Breast surgery  2/13-    rt axilly bx  . Portacath placement  03/27/2011    Procedure: INSERTION PORT-A-CATH;  Surgeon: Adin Hector, MD;  Location: [place];  Service: General;  Laterality: Left;  insert port a cath  . Port-a-cath removal  10/20/2011    Procedure: MINOR REMOVAL PORT-A-CATH;  Surgeon: Adin Hector, MD;  Location: [place];  Service: General;  Laterality: N/A;  Porta-cath removal  left    Subjective Symptoms/Limitations Symptoms: Pt states that she has been having progressive pain in the back of her heels.  She states she has been doing some stretches and icing with a slight  improvement.  She is being referred to therapy to decrease her sx of pain and improve her quality of life.  How long can you stand comfortably?: able to stand for an hour. How long can you walk comfortably?: Pt has been able to walk for thirty minutes.  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 Leisure: Hobbies-no      Sensation/Coordination/Flexibility/Functional Tests Functional Tests Functional Tests: FS 47  Assessment RLE AROM (degrees) Right Ankle Dorsiflexion: 8 Right Ankle Plantar Flexion: 45 Right Ankle Inversion: 25 Right Ankle Eversion: 20 RLE Strength Right Ankle Dorsiflexion: 3+/5 Right Ankle Plantar Flexion: 3+/5 Right Ankle Inversion: 3+/5 Right Ankle Eversion: 3+/5 LLE AROM (degrees) Left Ankle Dorsiflexion: 5 Left Ankle Plantar Flexion: 45 Left Ankle Inversion: 25 Left Ankle Eversion: 20 LLE Strength Left Ankle Dorsiflexion: 3+/5 Left Ankle Plantar Flexion: 3+/5 Left Ankle Inversion: 3+/5 Left Ankle Eversion: 3+/5 Palpation Palpation:  (mm spasms B throughout gastrocnemius mm )  Exercise/Treatments    Gastroc stretch 15 sec x 3 B   Manual Therapy Manual Therapy: Other (comment) Other Manual Therapy: myofascial release techniques used to B gastroc mm  Physical Therapy Assessment and Plan PT Assessment and Plan Clinical Impression Statement: Pt is a 53 yo female who has been suffering with B achillis tendonitis for several months.  The pt is now being referred to PT for evaluation and treatment.  Evaluation shows bilateral  decreased dorsiflexion as well as bilateral  decreased ankle strengths for all motions.  Pt will benefit from skilled PT to address these issues and return pt to previous level of function.    Pt will benefit from skilled therapeutic intervention in order to improve on the following deficits: Abnormal gait;Decreased strength;Difficulty walking;Decreased range of motion Rehab Potential: Good PT Frequency: Min 2X/week PT Duration: 4 weeks PT Treatment/Interventions: Therapeutic activities;Therapeutic exercise;Manual techniques;Modalities PT Plan: begin fascial stretch and strengthen isometric, t-band..... NO TOE WALKING     Goals Home Exercise Program Pt/caregiver will Perform Home Exercise Program: For increased ROM PT Goal: Perform Home Exercise Program - Progress: Progressing toward goal PT Short Term Goals Time to Complete Short Term Goals: 2 weeks PT Short Term Goal 1: Pt ROM B ankle DF to be to 10 degrees to allow normal heel toe walking. PT Short Term Goal 2: Pt pain to be no greater than a 3/10 PT Short Term Goal 3: Pt to be able to walk for 45 mintues without any pain PT Long Term Goals Time to Complete Long Term Goals: 4 weeks PT Long Term Goal 1: I in advance HEP PT Long Term Goal 2: Pt to be able to walk for an hour without any pain Long Term Goal 3: Pt to be able to stand for an hour without pain Long Term Goal 4: no spasms palpatable in gastrocnemius mm   Problem List Patient Active Problem List   Diagnosis Date Noted  . Pain in joint, ankle and foot 03/02/2013  . Stiffness of joint, not elsewhere classified, ankle and foot 03/02/2013  . Achilles tendonitis, bilateral 02/23/2013  . Bilateral foot pain 02/23/2013  . Metabolic syndrome X 07/02/2012  . Anemia 02/18/2012  . Obesity, morbid, BMI 40.0-49.9 02/18/2012  . OA (osteoarthritis) of knee 12/29/2011  . Knee pain, left 12/14/2011  . BRCA1 positive 07/01/2011  . Invasive ductal carcinoma of right breast, stage 1 02/18/2011  . 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  Prediabetes 01/22/2011  . Screening for colon cancer 05/29/2010  . History of iron deficiency anemia 05/29/2010  . HEADACHE 01/01/2010  . Thyroid nodule 05/17/2009  . HIP PAIN, RIGHT 01/11/2008  . FATIGUE 01/11/2008  . INGUINAL PAIN, RIGHT 01/11/2008  . RUPTURE ROTATOR CUFF 06/08/2007  . NEOPLASM, MALIGNANT, BREAST, LEFT 02/22/2007  . DYSLIPIDEMIA 02/22/2007    PT Plan of Care PT Home Exercise Plan: given   GP    RUSSELL,CINDY 03/02/2013, 5:30 PM  Physician Documentation Your signature is required to indicate approval of the treatment plan as stated above.  Please sign and either send electronically or make a copy of this report for your files and return this physician signed original.   Please mark one 1.__approve of plan  2. ___approve of plan with the following conditions.   ______________________________                                                            _____________________ Physician Signature                                                                                                             Date

## 2013-03-02 NOTE — Patient Instructions (Signed)
F/u in 4 month  10 pound weight loss  HBa1C, chem 7 today  Fasting lipid, HBa1C  In 4 month Commit to daily phentermine  VEGETABLES need to increase

## 2013-03-03 ENCOUNTER — Encounter: Payer: Self-pay | Admitting: Family Medicine

## 2013-03-03 LAB — BASIC METABOLIC PANEL
BUN: 14 mg/dL (ref 6–23)
CALCIUM: 9.4 mg/dL (ref 8.4–10.5)
CO2: 22 mEq/L (ref 19–32)
CREATININE: 0.79 mg/dL (ref 0.50–1.10)
Chloride: 106 mEq/L (ref 96–112)
Glucose, Bld: 87 mg/dL (ref 70–99)
Potassium: 4.2 mEq/L (ref 3.5–5.3)
Sodium: 138 mEq/L (ref 135–145)

## 2013-03-05 NOTE — Assessment & Plan Note (Signed)
Deteriorated. Patient re-educated about  the importance of commitment to a  minimum of 150 minutes of exercise per week. The importance of healthy food choices with portion control discussed. Encouraged to start a food diary, count calories and to consider  joining a support group. Sample diet sheets offered. Goals set by the patient for the next several months.    

## 2013-03-05 NOTE — Progress Notes (Signed)
   Subjective:    Patient ID: Kathleen Reynolds, female    DOB: [birth year], 53 y.o.   MRN: [id number]  HPI The PT is here for follow up and re-evaluation of chronic medical conditions, medication management and review of any available recent lab and radiology data.  Preventive health is updated, specifically  Cancer screening and Immunization.   Reports inconsistency in lifestyle change , hence weight remains unchanged, states she will change this, her biggest problem is with her diet , feels she is losing inches even if not pounds, and has purchased a dress to get into      Review of Systems See HPI Denies recent fever or chills. Denies sinus pressure, nasal congestion, ear pain or sore throat. Denies chest congestion, productive cough or wheezing. Denies chest pains, palpitations and leg swelling Denies abdominal pain, nausea, vomiting,diarrhea or constipation.   Denies dysuria, frequency, hesitancy or incontinence.  Denies headaches, seizures, numbness, or tingling. Denies depression, anxiety or insomnia. Denies skin break down or rash.        Objective:   Physical Exam  Patient alert and oriented and in no cardiopulmonary distress.  HEENT: No facial asymmetry, EOMI, no sinus tenderness,  oropharynx pink and moist.  Neck supple no adenopathy.  Chest: Clear to auscultation bilaterally.  CVS: S1, S2 no murmurs, no S3.  ABD: Soft non tender. Bowel sounds normal.  Ext: No edema  MS: Adequate ROM spine, shoulders, hips and knees.  Skin: Intact, no ulcerations or rash noted.  Psych: Good eye contact, normal affect. Memory intact not anxious or depressed appearing.  CNS: CN 2-12 intact, power, tone and sensation normal throughout.       Assessment & Plan:

## 2013-03-05 NOTE — Assessment & Plan Note (Signed)
The increased risk of cardiovascular disease associated with this diagnosis, and the need to consistently work on lifestyle to change this is discussed. Following  a  heart healthy diet ,commitment to 30 minutes of exercise at least 5 days per week, as well as control of blood sugar and cholesterol , and achieving a healthy weight are all the areas to be addressed .  

## 2013-03-05 NOTE — Assessment & Plan Note (Signed)
Improved, uses fioricet as needed, generally not needed more than twice per month

## 2013-03-05 NOTE — Assessment & Plan Note (Signed)
Patient educated about the importance of limiting  Carbohydrate intake , the need to commit to daily physical activity for a minimum of 30 minutes , and to commit weight loss. The fact that changes in all these areas will reduce or eliminate all together the development of diabetes is stressed.    

## 2013-03-05 NOTE — Assessment & Plan Note (Signed)
Hyperlipidemia:Low fat diet discussed and encouraged.  Updated lab needed at/ before next visit.  

## 2013-03-08 ENCOUNTER — Ambulatory Visit (HOSPITAL_COMMUNITY)
Admission: RE | Admit: 2013-03-08 | Discharge: 2013-03-08 | Disposition: A | Discharge status: Home / Self Care | Payer: BC Managed Care – PPO | Source: Ambulatory Visit | Attending: Family Medicine | Admitting: Family Medicine

## 2013-03-08 DIAGNOSIS — IMO0001 Reserved for inherently not codable concepts without codable children: Secondary | ICD-10-CM | POA: Insufficient documentation

## 2013-03-08 DIAGNOSIS — M79609 Pain in unspecified limb: Secondary | ICD-10-CM | POA: Insufficient documentation

## 2013-03-08 NOTE — Progress Notes (Signed)
Physical Therapy Treatment Patient Details  Name: Kathleen Reynolds MRN: [id number] Date of Birth: [birth year]  Today's Date: 03/08/2013 Time: 3167-4255 PT Time Calculation (min): 33 min Visit#: 2 of 8  Re-eval: 04/01/13 Authorization: [employer]  Charges:  Therex [phone] (12'), manual [phone] (19')   Subjective: Symptoms/Limitations Symptoms: Pt states her Rt achilles has been hurting, 4/10.  NO pain in her Lt.  States she is working today. Pain Assessment Currently in Pain?: Yes Pain Score: 4  Pain Orientation: Right   Exercise/Treatments Ankle Stretches Slant Board Stretch: 3 reps;30 seconds Ankle Exercises - Seated Other Seated Ankle Exercises: green theraband 5 reps each inv/ev/DF only    Manual Therapy Manual Therapy: Other (comment) Other Manual Therapy: MFR and friction massage to Rt gastroc and achilles, joint mobs to Rt ankle in prone lying.  Physical Therapy Assessment and Plan PT Assessment and Plan Clinical Impression Statement: Focused on Rt gastroc today as no pain voiced in LT.  Extreme tightness palpated with manual techniques with overall improvment and relief of pain at end of session.  Pt educated wtih therband exericses for bilateral ankles.  Given theraband and written instructions for HEP.  Pt will need reinforcment for exercise form. PT Plan: continue with  fascial stretch and strengthening. NO TOE WALKING      Problem List Patient Active Problem List   Diagnosis Date Noted  . Pain in joint, ankle and foot 03/02/2013  . Stiffness of joint, not elsewhere classified, ankle and foot 03/02/2013  . Achilles tendonitis, bilateral 02/23/2013  . Bilateral foot pain 02/23/2013  . Metabolic syndrome X 25/89/4834  . Anemia 02/18/2012  . Obesity, morbid, BMI 40.0-49.9 02/18/2012  . OA (osteoarthritis) of knee 12/29/2011  . Knee pain, left 12/14/2011  . BRCA1 positive 07/01/2011  . Invasive ductal carcinoma of right breast, stage 1 02/18/2011  . Colonic  adenoma 02/05/2011  . Prediabetes 01/22/2011  . Screening for colon cancer 05/29/2010  . History of iron deficiency anemia 05/29/2010  . HEADACHE 01/01/2010  . Thyroid nodule 05/17/2009  . HIP PAIN, RIGHT 01/11/2008  . FATIGUE 01/11/2008  . INGUINAL PAIN, RIGHT 01/11/2008  . RUPTURE ROTATOR CUFF 06/08/2007  . NEOPLASM, MALIGNANT, BREAST, LEFT 02/22/2007  . DYSLIPIDEMIA 02/22/2007    PT Plan of Care PT Home Exercise Plan: given for theraband ankle exericses along with green theraband   Teena Irani, PTA/CLT 03/08/2013, 10:23 AM

## 2013-03-15 ENCOUNTER — Inpatient Hospital Stay (HOSPITAL_COMMUNITY)
Admission: RE | Admit: 2013-03-15 | Payer: BC Managed Care – PPO | Source: Ambulatory Visit | Admitting: Physical Therapy

## 2013-03-22 ENCOUNTER — Ambulatory Visit (HOSPITAL_COMMUNITY): Payer: BC Managed Care – PPO | Admitting: Physical Therapy

## 2013-03-23 ENCOUNTER — Ambulatory Visit (HOSPITAL_COMMUNITY): Payer: BC Managed Care – PPO | Admitting: *Deleted

## 2013-03-24 ENCOUNTER — Other Ambulatory Visit (HOSPITAL_COMMUNITY): Payer: Self-pay

## 2013-03-24 DIAGNOSIS — C50919 Malignant neoplasm of unspecified site of unspecified female breast: Secondary | ICD-10-CM

## 2013-03-28 ENCOUNTER — Encounter (HOSPITAL_COMMUNITY): Payer: BC Managed Care – PPO | Attending: Hematology and Oncology

## 2013-03-28 DIAGNOSIS — C50919 Malignant neoplasm of unspecified site of unspecified female breast: Secondary | ICD-10-CM | POA: Insufficient documentation

## 2013-03-28 DIAGNOSIS — C50419 Malignant neoplasm of upper-outer quadrant of unspecified female breast: Secondary | ICD-10-CM

## 2013-03-28 LAB — CBC WITH DIFFERENTIAL/PLATELET
BASOS ABS: 0 10*3/uL (ref 0.0–0.1)
BASOS PCT: 0 % (ref 0–1)
Eosinophils Absolute: 0.1 10*3/uL (ref 0.0–0.7)
Eosinophils Relative: 2 % (ref 0–5)
HCT: 39.5 % (ref 36.0–46.0)
Hemoglobin: 13.8 g/dL (ref 12.0–15.0)
LYMPHS PCT: 39 % (ref 12–46)
Lymphs Abs: 1.6 10*3/uL (ref 0.7–4.0)
MCH: 31.9 pg (ref 26.0–34.0)
MCHC: 34.9 g/dL (ref 30.0–36.0)
MCV: 91.2 fL (ref 78.0–100.0)
Monocytes Absolute: 0.2 10*3/uL (ref 0.1–1.0)
Monocytes Relative: 6 % (ref 3–12)
NEUTROS ABS: 2.2 10*3/uL (ref 1.7–7.7)
Neutrophils Relative %: 54 % (ref 43–77)
Platelets: 221 10*3/uL (ref 150–400)
RBC: 4.33 MIL/uL (ref 3.87–5.11)
RDW: 14.5 % (ref 11.5–15.5)
WBC: 4.1 10*3/uL (ref 4.0–10.5)

## 2013-03-28 LAB — COMPREHENSIVE METABOLIC PANEL
ALBUMIN: 4.3 g/dL (ref 3.5–5.2)
ALT: 29 U/L (ref 0–35)
AST: 27 U/L (ref 0–37)
Alkaline Phosphatase: 71 U/L (ref 39–117)
BILIRUBIN TOTAL: 0.3 mg/dL (ref 0.3–1.2)
BUN: 14 mg/dL (ref 6–23)
CALCIUM: 9.5 mg/dL (ref 8.4–10.5)
CHLORIDE: 103 meq/L (ref 96–112)
CO2: 23 mEq/L (ref 19–32)
Creatinine, Ser: 0.72 mg/dL (ref 0.50–1.10)
GFR calc Af Amer: >90 mL/min (ref >90)
GFR calc non Af Amer: >90 mL/min (ref >90)
Glucose, Bld: 108 mg/dL — ABNORMAL HIGH (ref 70–99)
Potassium: 4.1 mEq/L (ref 3.7–5.3)
Sodium: 140 mEq/L (ref 137–147)
Total Protein: 7.8 g/dL (ref 6.0–8.3)

## 2013-03-28 NOTE — Progress Notes (Signed)
Labs drawn today for cbc/diff,cmp 

## 2013-03-29 ENCOUNTER — Ambulatory Visit (HOSPITAL_COMMUNITY)
Admission: RE | Admit: 2013-03-29 | Discharge: 2013-03-29 | Disposition: A | Discharge status: Home / Self Care | Payer: BC Managed Care – PPO | Source: Ambulatory Visit | Attending: Family Medicine | Admitting: Family Medicine

## 2013-03-29 NOTE — Progress Notes (Signed)
Physical Therapy Treatment Patient Details  Name: Kathleen Reynolds MRN: [id number] Date of Birth: 31-May-1960  Today's Date: 03/29/2013 Time: 0223-3612 PT Time Calculation (min): 45 min Charges: Therex x [phone]) Manual x [phone])  Visit#: 3 of 8  Re-eval: 04/01/13  Authorization: [employer]    Subjective: Symptoms/Limitations Symptoms: Pt states that she wore high heels to church and it has caused her to have more pain.  Pain Assessment Currently in Pain?: Yes Pain Score: 6  Pain Location: Heel Pain Orientation: Right;Left;Posterior   Exercise/Treatments Ankle Stretches Plantar Fascia Stretch: 3 reps;30 seconds Slant Board Stretch: 3 reps;30 seconds;Limitations Slant Board Stretch Limitations: Gastroc and soleus Ankle Exercises - Seated Other Seated Ankle Exercises: green theraband 10 reps each inv/ev/DF only  Manual Therapy Manual Therapy: Myofascial release Myofascial Release: MFR to bilateral calf musculature to decrease pain and fascial restrictions  Physical Therapy Assessment and Plan PT Assessment and Plan Clinical Impression Statement: Therapist facilitated therapeutics exercises to decrease calf/plantar fascia tightness and ankle strength. Pt requires multimodal cueing to isolate ankle motion with theraband strengthening. Myofascial release (MFR) completed to bilateral calves to decrease pain and fascial restrictions. Greater tightness and spasms noted throughout right calf compared to right. Pt reports pain decrease to 1/10 at end of session.   PT Plan: Continue to progress ankle strength and decrease fascial restrictions per PT POC. (NO TOE WALKING)      Problem List Patient Active Problem List   Diagnosis Date Noted  . Pain in joint, ankle and foot 03/02/2013  . Stiffness of joint, not elsewhere classified, ankle and foot 03/02/2013  . Achilles tendonitis, bilateral 02/23/2013  . Bilateral foot pain 02/23/2013  . Metabolic syndrome X 67/02/4101   . Anemia 02/18/2012  . Obesity, morbid, BMI 40.0-49.9 02/18/2012  . OA (osteoarthritis) of knee 12/29/2011  . Knee pain, left 12/14/2011  . BRCA1 positive 07/01/2011  . Invasive ductal carcinoma of right breast, stage 1 02/18/2011  . 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 Prediabetes 01/22/2011  . Screening for colon cancer 05/29/2010  . History of iron deficiency anemia 05/29/2010  . HEADACHE 01/01/2010  . Thyroid nodule 05/17/2009  . HIP PAIN, RIGHT 01/11/2008  . FATIGUE 01/11/2008  . INGUINAL PAIN, RIGHT 01/11/2008  . RUPTURE ROTATOR CUFF 06/08/2007  . NEOPLASM, MALIGNANT, BREAST, LEFT 02/22/2007  . DYSLIPIDEMIA 02/22/2007    PT - End of Session Activity Tolerance: Patient tolerated treatment well General Behavior During Therapy: [facility] for tasks assessed/performed  Rachelle Hora, PTA  03/29/2013, 9:53 AM

## 2013-03-30 ENCOUNTER — Ambulatory Visit (HOSPITAL_COMMUNITY): Payer: BC Managed Care – PPO

## 2013-03-30 ENCOUNTER — Ambulatory Visit (HOSPITAL_COMMUNITY): Payer: Self-pay | Admitting: Oncology

## 2013-03-31 ENCOUNTER — Other Ambulatory Visit (HOSPITAL_COMMUNITY): Payer: BC Managed Care – PPO

## 2013-04-04 ENCOUNTER — Ambulatory Visit (HOSPITAL_COMMUNITY)
Admission: RE | Admit: 2013-04-04 | Discharge: 2013-04-04 | Disposition: A | Discharge status: Home / Self Care | Payer: BC Managed Care – PPO | Source: Ambulatory Visit | Attending: Family Medicine | Admitting: Family Medicine

## 2013-04-04 DIAGNOSIS — M25673 Stiffness of unspecified ankle, not elsewhere classified: Secondary | ICD-10-CM

## 2013-04-04 DIAGNOSIS — M7660 Achilles tendinitis, unspecified leg: Secondary | ICD-10-CM | POA: Insufficient documentation

## 2013-04-04 DIAGNOSIS — M25579 Pain in unspecified ankle and joints of unspecified foot: Secondary | ICD-10-CM

## 2013-04-04 DIAGNOSIS — IMO0001 Reserved for inherently not codable concepts without codable children: Secondary | ICD-10-CM | POA: Insufficient documentation

## 2013-04-04 DIAGNOSIS — M25676 Stiffness of unspecified foot, not elsewhere classified: Secondary | ICD-10-CM

## 2013-04-04 NOTE — Progress Notes (Signed)
Physical Therapy Treatment Patient Details  Name: Kathleen Reynolds MRN: [id number] Date of Birth: 1961/01/22  Today's Date: 04/04/2013 Time: 1018-1100 PT Time Calculation (min): 42 min Charge TE [id number], Manual 1035-1100  Visit#: 4 of 8  Re-eval: 04/01/13    Authorization: BCBS  Authorization Time Period:    Authorization Visit#:   of     Subjective: Symptoms/Limitations Symptoms: Pt stated Lt heel  is feeling good today, Rt pain scale 5/10 today.  Feeling better, Pain Assessment Currently in Pain?: Yes Pain Score: 5  Pain Location: Heel Pain Orientation: Right;Posterior  Objective:  Exercise/Treatments Ankle Stretches Plantar Fascia Stretch: 3 reps;30 seconds Slant Board Stretch: 3 reps;30 seconds;Limitations Slant Board Stretch Limitations: Gastroc and soleus Ankle Exercises - Seated Other Seated Ankle Exercises: blue theraband 15x each LE DF/ Inv/Env Manual Therapy Manual Therapy: Myofascial release Myofascial Release: MFR to bilateral calf musculature to decrease pain and fascial restrictions  Physical Therapy Assessment and Plan PT Assessment and Plan Clinical Impression Statement: Pt with improved ankle coordination, min cueing to reduce compensation wtih theraband activties for strengthening, able to increase resistance to blue theraband without difficutly.  Manual techniques complete to reduce fascial restrictions and passive stretches for increased flexibilty.  Pt stated pain free at end of session.      Goals PT Short Term Goals Time to Complete Short Term Goals: 2 weeks PT Short Term Goal 1: Pt ROM B ankle DF to be to 10 degrees to allow normal heel toe walking. PT Short Term Goal 1 - Progress: Progressing toward goal PT Short Term Goal 2: Pt pain to be no greater than a 3/10 PT Short Term Goal 2 - Progress: Progressing toward goal PT Short Term Goal 3: Pt to be able to walk for 45 mintues without any pain PT Long Term Goals Time to Complete Long Term  Goals: 4 weeks PT Long Term Goal 1: I in advance HEP PT Long Term Goal 2: Pt to be able to walk for an hour without any pain Long Term Goal 3: Pt to be able to stand for an hour without pain Long Term Goal 4: no spasms palpatable in gastrocnemius mm   Problem List Patient Active Problem List   Diagnosis Date Noted  . Pain in joint, ankle and foot 03/02/2013  . Stiffness of joint, not elsewhere classified, ankle and foot 03/02/2013  . Achilles tendonitis, bilateral 02/23/2013  . Bilateral foot pain 02/23/2013  . Metabolic syndrome X 80/99/8338  . Anemia 02/18/2012  . Obesity, morbid, BMI 40.0-49.9 02/18/2012  . OA (osteoarthritis) of knee 12/29/2011  . Knee pain, left 12/14/2011  . BRCA1 positive 07/01/2011  . Invasive ductal carcinoma of right breast, stage 1 02/18/2011  . 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 Prediabetes 01/22/2011  . Screening for colon cancer 05/29/2010  . History of iron deficiency anemia 05/29/2010  . HEADACHE 01/01/2010  . Thyroid nodule 05/17/2009  . HIP PAIN, RIGHT 01/11/2008  . FATIGUE 01/11/2008  . INGUINAL PAIN, RIGHT 01/11/2008  . RUPTURE ROTATOR CUFF 06/08/2007  . NEOPLASM, MALIGNANT, BREAST, LEFT 02/22/2007  . DYSLIPIDEMIA 02/22/2007    PT - End of Session Activity Tolerance: Patient tolerated treatment well General Behavior During Therapy: WFL for tasks assessed/performed  GP   Devona Konig, DPT, PT Aldona Lento 04/04/2013, 12:27 PM

## 2013-04-06 ENCOUNTER — Ambulatory Visit (HOSPITAL_COMMUNITY)
Admission: RE | Admit: 2013-04-06 | Discharge: 2013-04-06 | Disposition: A | Discharge status: Home / Self Care | Payer: BC Managed Care – PPO | Source: Ambulatory Visit | Attending: Family Medicine | Admitting: Family Medicine

## 2013-04-06 ENCOUNTER — Ambulatory Visit (INDEPENDENT_AMBULATORY_CARE_PROVIDER_SITE_OTHER): Payer: BC Managed Care – PPO | Admitting: Orthopedic Surgery

## 2013-04-06 VITALS — BP 123/73 | Ht 64.0 in | Wt 293.0 lb

## 2013-04-06 DIAGNOSIS — M25676 Stiffness of unspecified foot, not elsewhere classified: Secondary | ICD-10-CM

## 2013-04-06 DIAGNOSIS — M25673 Stiffness of unspecified ankle, not elsewhere classified: Secondary | ICD-10-CM

## 2013-04-06 DIAGNOSIS — M25579 Pain in unspecified ankle and joints of unspecified foot: Secondary | ICD-10-CM

## 2013-04-06 DIAGNOSIS — M7662 Achilles tendinitis, left leg: Secondary | ICD-10-CM

## 2013-04-06 DIAGNOSIS — M7660 Achilles tendinitis, unspecified leg: Secondary | ICD-10-CM

## 2013-04-06 DIAGNOSIS — M79672 Pain in left foot: Principal | ICD-10-CM

## 2013-04-06 DIAGNOSIS — IMO0001 Reserved for inherently not codable concepts without codable children: Secondary | ICD-10-CM | POA: Diagnosis not present

## 2013-04-06 DIAGNOSIS — M79609 Pain in unspecified limb: Secondary | ICD-10-CM

## 2013-04-06 DIAGNOSIS — M7661 Achilles tendinitis, right leg: Secondary | ICD-10-CM

## 2013-04-06 DIAGNOSIS — M79671 Pain in right foot: Secondary | ICD-10-CM

## 2013-04-06 NOTE — Progress Notes (Addendum)
Physical Therapy Re-evaluation/Progress Note/Discharge summary  Patient Details  Name: Kathleen Reynolds MRN: [id number] Date of Birth: [birth year]  Today's Date: 04/06/2013 Time: 1350-1440 PT Time Calculation (min): 50 min Charge TE [id number], Manual 1415-1430, MMT/ROM Measurement 1430-1435, FOTO no charge              Visit#: 5 of 8  Re-eval: 04/01/13 Assessment Diagnosis: B achilles tendoinitis Next MD Visit: Aline Brochure 04/06/2013  Authorization: BCBS     Subjective Symptoms/Limitations Symptoms: Pt reported pain free today, reports most pain arrives following driving and sit to stands.   Pain Assessment Currently in Pain?: No/denies  Sensation/Coordination/Flexibility/Functional Tests Functional Tests Functional Tests: FOTO status 89%, limitation 11% (FS 47)  Assessment RLE AROM (degrees) Right Ankle Dorsiflexion: 18 Right Ankle Plantar Flexion: 45 Right Ankle Inversion: 25 Right Ankle Eversion: 23 RLE Strength Right Ankle Dorsiflexion:  (4+/5 was 3+/5) Right Ankle Plantar Flexion: 5/5 (was 3+/5) Right Ankle Inversion: 5/5 (was 3+/5) Right Ankle Eversion: 5/5 (was 3+/5) LLE AROM (degrees) Left Ankle Dorsiflexion: 15 (5) Left Ankle Plantar Flexion: 45 Left Ankle Inversion: 25 Left Ankle Eversion: 28 LLE Strength Left Ankle Dorsiflexion:  (4+/5 was 3+/5) Left Ankle Plantar Flexion: 5/5 (was 3+/5) Left Ankle Inversion: 5/5 (was 3+/5) Left Ankle Eversion: 5/5 (was 3+/5) Palpation Palpation: minimal spasms Lt gastroc/soleus, no spasms palpated on Rt  (was multiple spasms palpated Bil gastrocnemius)  Exercise/Treatments Ankle Stretches Plantar Fascia Stretch: 3 reps;30 seconds Slant Board Stretch: 3 reps;30 seconds;Limitations Slant Board Stretch Limitations: Gastroc and soleus Ankle Exercises - Standing SLS: L 60", Rt 35" max of 3;  5 reps small movements to increased intrinsic mm activation Heel Raises: 10 reps Toe Raise: 10 reps    Manual Therapy Manual  Therapy: Myofascial release Myofascial Release: MFR to bilateral calf musculature to decrease pain and fascial restrictions  Physical Therapy Assessment and Plan PT Assessment and Plan Clinical Impression Statement: Re-eval complete with the following findings:  Kathleen Reynolds is complaint with HEP and able to demonstrate appropriate technique with all exercises.  Pt reports pain free with increased functional abilities to be able to stand and walk for one hour without increased pain.  Bil ankle strength and ROM are WNL.  Significant decrease in Bil LE gastrocnemius with improved flexibilty.  Reviewed HEP, pt confident and compliant with all exercises.  Pt with significant improvements in perceived  functional abilites per FOTO score. PT Plan: D/C to HEP per all goals met.      Goals PT Short Term Goals Time to Complete Short Term Goals: 2 weeks PT Short Term Goal 1: Pt ROM B ankle DF to be to 10 degrees to allow normal heel toe walking. PT Short Term Goal 1 - Progress: Met PT Short Term Goal 2: Pt pain to be no greater than a 3/10 PT Short Term Goal 2 - Progress: Met PT Short Term Goal 3: Pt to be able to walk for 45 mintues without any pain PT Short Term Goal 3 - Progress: Met PT Long Term Goals Time to Complete Long Term Goals: 4 weeks PT Long Term Goal 1: I in advance HEP PT Long Term Goal 1 - Progress: Met PT Long Term Goal 2: Pt to be able to walk for an hour without any pain PT Long Term Goal 2 - Progress: Met Long Term Goal 3: Pt to be able to stand for an hour without pain Long Term Goal 3 Progress: Met Long Term Goal 4: no spasms palpatable in gastrocnemius mm  Long Term  Goal 4 Progress: Progressing toward goal  Problem List Patient Active Problem List   Diagnosis Date Noted  . Pain in joint, ankle and foot 03/02/2013  . Stiffness of joint, not elsewhere classified, ankle and foot 03/02/2013  . Achilles tendonitis, bilateral 02/23/2013  . Bilateral foot pain 02/23/2013  .  Metabolic syndrome X 81/11/3157  . Anemia 02/18/2012  . Obesity, morbid, BMI 40.0-49.9 02/18/2012  . OA (osteoarthritis) of knee 12/29/2011  . Knee pain, left 12/14/2011  . BRCA1 positive 07/01/2011  . Invasive ductal carcinoma of right breast, stage 1 02/18/2011  . 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 Prediabetes 01/22/2011  . Screening for colon cancer 05/29/2010  . History of iron deficiency anemia 05/29/2010  . HEADACHE 01/01/2010  . Thyroid nodule 05/17/2009  . HIP PAIN, RIGHT 01/11/2008  . FATIGUE 01/11/2008  . INGUINAL PAIN, RIGHT 01/11/2008  . RUPTURE ROTATOR CUFF 06/08/2007  . NEOPLASM, MALIGNANT, BREAST, LEFT 02/22/2007  . DYSLIPIDEMIA 02/22/2007    PT - End of Session Activity Tolerance: Patient tolerated treatment well General Behavior During Therapy: [facility] for tasks assessed/performed  GP    Aldona Lento 04/06/2013, 2:53 PM  Physician Documentation Your signature is required to indicate approval of the treatment plan as stated above.  Please sign and either send electronically or make a copy of this report for your files and return this physician signed original.   Please mark one 1.__approve of plan  2. ___approve of plan with the following conditions.   ______________________________                                                          _____________________ Physician Signature                                                                                                             Date

## 2013-04-06 NOTE — Progress Notes (Signed)
Patient ID: Kathleen Reynolds, female   DOB: [birth year], 53 y.o.   MRN: [id number] Chief Complaint  Patient presents with  . Follow-up    6 week recheck bilateral achilles tendonitis s/p physical therapy    Status post physical therapy for bilateral Achilles tendinitis after other measures did not help. She was also in agreement on 500 mg twice a day. She reports resolution of symptoms except when the shoe counter hits the back of her foot  Review of systems negative for numbness tingling weakness catching locking or giving way  Physical examination shows well-developed well-nourished female oriented x3 mood affect normal gait normal  Inspection of both feet reveals no tenderness. She has full range of motion at the ankle joint. Achilles tendons are intact without nodularity. Ankle joints are stable  Impression Achilles tendinitis resolved  Take Relafen Followup as needed

## 2013-04-12 ENCOUNTER — Encounter (HOSPITAL_COMMUNITY): Payer: BC Managed Care – PPO | Attending: Hematology and Oncology

## 2013-04-12 ENCOUNTER — Encounter (HOSPITAL_COMMUNITY): Payer: Self-pay

## 2013-04-12 VITALS — BP 115/75 | HR 83 | Temp 97.7°F | Resp 21 | Wt 288.9 lb

## 2013-04-12 DIAGNOSIS — C50919 Malignant neoplasm of unspecified site of unspecified female breast: Secondary | ICD-10-CM | POA: Insufficient documentation

## 2013-04-12 DIAGNOSIS — Z1501 Genetic susceptibility to malignant neoplasm of breast: Secondary | ICD-10-CM

## 2013-04-12 DIAGNOSIS — C50419 Malignant neoplasm of upper-outer quadrant of unspecified female breast: Secondary | ICD-10-CM

## 2013-04-12 DIAGNOSIS — Z1509 Genetic susceptibility to other malignant neoplasm: Secondary | ICD-10-CM

## 2013-04-12 DIAGNOSIS — Z853 Personal history of malignant neoplasm of breast: Secondary | ICD-10-CM

## 2013-04-12 DIAGNOSIS — Z171 Estrogen receptor negative status [ER-]: Secondary | ICD-10-CM

## 2013-04-12 NOTE — Patient Instructions (Signed)
[facility] Discharge Instructions  RECOMMENDATIONS MADE BY THE CONSULTANT AND ANY TEST RESULTS WILL BE SENT TO YOUR REFERRING PHYSICIAN.  EXAM FINDINGS BY THE PHYSICIAN TODAY AND SIGNS OR SYMPTOMS TO REPORT TO CLINIC OR PRIMARY PHYSICIAN: Exam and findings as discussed by Dr. Barnet Glasgow.  Labs are stable.  Report any new lumps, bone pain, shortness of breath or other symptoms.  MEDICATIONS PRESCRIBED:  none  INSTRUCTIONS/FOLLOW-UP: Follow-up with labs and office visit in 6 months.  Thank you for choosing [facility] to provide your oncology and hematology care.  To afford each patient quality time with our providers, please arrive at least 15 minutes before your scheduled appointment time.  With your help, our goal is to use those 15 minutes to complete the necessary work-up to ensure our physicians have the information they need to help with your evaluation and healthcare recommendations.    Effective January 1st, 2014, we ask that you re-schedule your appointment with our physicians should you arrive 10 or more minutes late for your appointment.  We strive to give you quality time with our providers, and arriving late affects you and other patients whose appointments are after yours.    Again, thank you for choosing [facility].  Our hope is that these requests will decrease the amount of time that you wait before being seen by our physicians.       _____________________________________________________________  Should you have questions after your visit to [facility], please contact our office at [phone] between the hours of 8:30 a.m. and 5:00 p.m.  Voicemails left after 4:30 p.m. will not be returned until the following business day.  For prescription refill requests, have your pharmacy contact our office with your prescription refill request.

## 2013-04-12 NOTE — Progress Notes (Signed)
[facility]  OFFICE PROGRESS NOTE  [name], MD [address], [address] [address]  DIAGNOSIS: Invasive ductal carcinoma of breast, stage 1  BRCA1 positive  Chief Complaint  Patient presents with  . Breast cancer, triple negative    CURRENT THERAPY: Watchful expectation.  INTERVAL HISTORY: Nakea Gouger 53 y.o. female returns for followup of bilateral breast cancer the first of 1999 the second 2013, both triple negative, both treated with surgery followed by chemotherapy with lumpectomy and radiation on the right and mastectomy on the left, the latter performed in October 1999. She is BRCA1 positive. She has 3 sisters but she is not sure whether they had gone for testing. She has a cousin with breast cancer with mother also had breast cancer. She did see Dr. cousins in November of 2014. Recommendation was to lose weight before the oophorectomy was to be done. She denies any lymphedema. Since last night she developed a hacking cough with out nasal congestion or earache, sore throat, or fever. She does have spring allergies. Bowel movements are regular with no melena, hematochezia, vaginal bleeding, hot flashes, abdominal pain, bone pain, wheezing, skin rash, headache, or seizures.  MEDICAL HISTORY: Past Medical History  Diagnosis Date  . Migraines   . Breast mass in female     right  . Arthritis   . Hyperlipidemia   . Breast cancer     Right  . Left rotator cuff tear   . Morbid obesity   . Anemia LIFELONG    HEAVY MENSES MAR 2012 HB 13 MCV 86.7 FERRITIN  66    INTERIM HISTORY: has NEOPLASM, MALIGNANT, BREAST, LEFT; Thyroid nodule; DYSLIPIDEMIA; HIP PAIN, RIGHT; RUPTURE ROTATOR CUFF; FATIGUE; HEADACHE; INGUINAL PAIN, RIGHT; Screening for colon cancer; History of iron deficiency anemia; Prediabetes; Colonic adenoma; Invasive ductal carcinoma of right breast, stage 1; BRCA1 positive; Knee pain, left; OA  (osteoarthritis) of knee; Anemia; Obesity, morbid, BMI 40.0-49.9; Metabolic syndrome X; Achilles tendonitis, bilateral; Bilateral foot pain; Pain in joint, ankle and foot; and Stiffness of joint, not elsewhere classified, ankle and foot on her problem list.    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 Fanny Skates on 02/13/2011 followed by sentinel node biopsy which was negative. Her Ki-67 marker was high at 54% HER-2/neu was nonamplified and she was ER/PR negative. She is  status post 6 cycles of carboplatin and Taxotere (04/03/11- 07/16/11) and S/P radiation finishing in August 2013. AND History of left-sided breast cancer stage II grade 3 triple negative at that time as well with 7 negative nodes but a 2.3 cm primary with mastectomy on 11/27/1997. She was treated with AC. x4 cycles followed by Taxol x4 cycles thus far without recurrence  ALLERGIES:  is allergic to ibuprofen.  MEDICATIONS: has a current medication list which includes the following prescription(s): butalbital-acetaminophen-caffeine, flaxseed oil, fluticasone, garlic, meclizine, metformin, multivitamin with minerals, nabumetone, and phentermine, and the following Facility-Administered Medications: influenza (>/= 3 years) inactive virus vaccine.  SURGICAL HISTORY:  Past Surgical History  Procedure Laterality Date  . Dilation and curettage of uterus    . Colonoscopy  2004 DR. SMITH    No polyp, hemorrhoids  . Colonoscopy  06/09/10    GQQ:PYPPJK adenomas   . Hystersonogram  2009  . Dilation and curettage of uterus  05/2002  . Mastectomy  1999    left breast cancer   .  Breast surgery  11/1997    Left breast cancer-mastectomy  . Breast biopsy  02/13/2011    Procedure: BREAST BIOPSY WITH NEEDLE LOCALIZATION;  Surgeon: Adin Hector, MD;  Location: [facility];  Service: General;  Laterality: Right;  . Breast surgery  2/13-    rt axilly bx  . Portacath  placement  03/27/2011    Procedure: INSERTION PORT-A-CATH;  Surgeon: Adin Hector, MD;  Location: [place];  Service: General;  Laterality: Left;  insert port a cath  . Port-a-cath removal  10/20/2011    Procedure: MINOR REMOVAL PORT-A-CATH;  Surgeon: Adin Hector, MD;  Location: [place];  Service: General;  Laterality: N/A;  Porta-cath removal  left    FAMILY HISTORY: family history includes Cancer in her brother and father; Diabetes in her sister; Heart disease in her sister; Hypertension in her mother.  SOCIAL HISTORY:  reports that she has never smoked. She has never used smokeless tobacco. She reports that she drinks about 1.0 ounces of alcohol per week. She reports that she does not use illicit drugs.  REVIEW OF SYSTEMS:  Other than that discussed above is noncontributory.  PHYSICAL EXAMINATION: ECOG PERFORMANCE STATUS: 1 - Symptomatic but completely ambulatory  Weight 288 lb 14.4 oz (131.044 kg), last menstrual period 03/27/2011.  GENERAL:alert, no distress and comfortable. Morbidly obese. SKIN: skin color, texture, turgor are normal, no rashes or significant lesions EYES: PERLA; Conjunctiva are pink and non-injected, sclera clear OROPHARYNX:no exudate, no erythema on lips, buccal mucosa, or tongue. NECK: supple, thyroid normal size, non-tender, without nodularity. No masses CHEST: Status post left mastectomy with no subcutaneous nodules. Right breast is pendulous with no masses appreciated. LYMPH:  no palpable lymphadenopathy in the cervical, axillary or inguinal LUNGS: clear to auscultation and percussion with normal breathing effort HEART: regular rate & rhythm and no murmurs. ABDOMEN:abdomen soft, non-tender and normal bowel sounds MUSCULOSKELETAL:no cyanosis of digits and no clubbing. Range of motion normal.  NEURO: alert & oriented x 3 with fluent speech, no focal motor/sensory deficits   LABORATORY DATA: Infusion on 03/28/2013    Component Date Value Ref Range Status  . WBC 03/28/2013 4.1  4.0 - 10.5 K/uL Final  . RBC 03/28/2013 4.33  3.87 - 5.11 MIL/uL Final  . Hemoglobin 03/28/2013 13.8  12.0 - 15.0 g/dL Final  . HCT 03/28/2013 39.5  36.0 - 46.0 % Final  . MCV 03/28/2013 91.2  78.0 - 100.0 fL Final  . MCH 03/28/2013 31.9  26.0 - 34.0 pg Final  . MCHC 03/28/2013 34.9  30.0 - 36.0 g/dL Final  . RDW 03/28/2013 14.5  11.5 - 15.5 % Final  . Platelets 03/28/2013 221  150 - 400 K/uL Final  . Neutrophils Relative % 03/28/2013 54  43 - 77 % Final  . Neutro Abs 03/28/2013 2.2  1.7 - 7.7 K/uL Final  . Lymphocytes Relative 03/28/2013 39  12 - 46 % Final  . Lymphs Abs 03/28/2013 1.6  0.7 - 4.0 K/uL Final  . Monocytes Relative 03/28/2013 6  3 - 12 % Final  . Monocytes Absolute 03/28/2013 0.2  0.1 - 1.0 K/uL Final  . Eosinophils Relative 03/28/2013 2  0 - 5 % Final  . Eosinophils Absolute 03/28/2013 0.1  0.0 - 0.7 K/uL Final  . Basophils Relative 03/28/2013 0  0 - 1 % Final  . Basophils Absolute 03/28/2013 0.0  0.0 - 0.1 K/uL Final  . Sodium 03/28/2013 140  137 - 147 mEq/L  Final  . Potassium 03/28/2013 4.1  3.7 - 5.3 mEq/L Final  . Chloride 03/28/2013 103  96 - 112 mEq/L Final  . CO2 03/28/2013 23  19 - 32 mEq/L Final  . Glucose, Bld 03/28/2013 108* 70 - 99 mg/dL Final  . BUN 03/28/2013 14  6 - 23 mg/dL Final  . Creatinine, Ser 03/28/2013 0.72  0.50 - 1.10 mg/dL Final  . Calcium 03/28/2013 9.5  8.4 - 10.5 mg/dL Final  . Total Protein 03/28/2013 7.8  6.0 - 8.3 g/dL Final  . Albumin 03/28/2013 4.3  3.5 - 5.2 g/dL Final  . AST 03/28/2013 27  0 - 37 U/L Final  . ALT 03/28/2013 29  0 - 35 U/L Final  . Alkaline Phosphatase 03/28/2013 71  39 - 117 U/L Final  . Total Bilirubin 03/28/2013 0.3  0.3 - 1.2 mg/dL Final  . GFR calc non Af Amer 03/28/2013 >90  >90 mL/min Final  . GFR calc Af Amer 03/28/2013 >90  >90 mL/min Final   Comment: (NOTE)                          The eGFR has been calculated using the CKD EPI equation.                           This calculation has not been validated in all clinical situations.                          eGFR's persistently <90 mL/min signify possible Chronic Kidney                          Disease.    PATHOLOGY: Triple negative bilateral breast cancer.  Urinalysis No results found for this basename: colorurine, appearanceur, labspec, phurine, glucoseu, hgbur, bilirubinur, ketonesur, proteinur, urobilinogen, nitrite, leukocytesur    RADIOGRAPHIC STUDIES: MM Digital Diagnostic Unilat R Status: Final result            Study Result    CLINICAL DATA: Patient is status post left mastectomy. Right  lumpectomy in 2013.  EXAM:  DIGITAL DIAGNOSTIC RIGHT MAMMOGRAM  COMPARISON: Multiple priors  ACR Breast Density Category b: There are scattered areas of  fibroglandular density.  FINDINGS:  No suspicious changes in the lumpectomy bed in the upper posterior  right breast. New dystrophic calcifications are noted within the  lumpectomy site. No suspicious microcalcifications, masses, or new  architectural distortion.  IMPRESSION:  Benign findings  RECOMMENDATION:  Right diagnostic mammogram in 1 year.  I have discussed the findings and recommendations with the patient.  Results were also provided in writing at the conclusion of the  visit. If applicable, a reminder letter will be sent to the patient  regarding the next appointment.  BI-RADS CATEGORY 2: Benign Finding(s)  Electronically Signed  By: Donavan Burnet M.D.  On: 12/21/2012 13      ASSESSMENT:  #1.Right sided triple negative breast cancer 9 mm in size status post wire localization and removal by Dr. Fanny Skates on 02/13/2011 followed by sentinel node biopsy which was negative. Her Ki-67 marker was high at 54% HER-2/neu was nonamplified and she was ER/PR negative. She is now status post 6 cycles of carboplatin and Taxotere and S/P radiation. No evidence of recurrence. #2.Left-sided breast cancer stage  II grade 3 triple negative at that time as well with 7 negative  nodes but a 2.3 cm primary with mastectomy on 11/27/1997. She was treated with AC. x4 cycles followed by Taxol x4 cycles thus far without recurrence. #3. BRCA1 positivity. #4. Morbid obesity, attending to lose weight in anticipation of undergoing bilateral oophorectomy. #5. Symptomatic allergic rhinitis. #6. Diabetes mellitus with metabolic syndrome.   PLAN:  #1. Weight reduction. #2. Use Netti-pot each night prior to using Flonase inhaler. May add loratadine 10 mg daily to help with rhinitis symptoms and cough. #3. She has notified her 3 sisters of her BRCA positivity. #4. Followup in 6 months with CBC, chem profile, CEA, and CA 27-29 with mammogram scheduled in November of 2015.   All questions were answered. The patient knows to call the clinic with any problems, questions or concerns. We can certainly see the patient much sooner if necessary.   I spent 25 minutes counseling the patient face to face. The total time spent in the appointment was 30 minutes.    Doroteo Bradford, MD 04/12/2013 1:31 PM

## 2013-06-05 ENCOUNTER — Ambulatory Visit
Admission: RE | Admit: 2013-06-05 | Discharge: 2013-06-05 | Disposition: A | Discharge status: Home / Self Care | Payer: BC Managed Care – PPO | Source: Ambulatory Visit | Attending: Oncology | Admitting: Oncology

## 2013-06-05 ENCOUNTER — Other Ambulatory Visit: Payer: Self-pay | Admitting: Family Medicine

## 2013-06-05 DIAGNOSIS — C50911 Malignant neoplasm of unspecified site of right female breast: Secondary | ICD-10-CM

## 2013-06-05 DIAGNOSIS — Z1501 Genetic susceptibility to malignant neoplasm of breast: Secondary | ICD-10-CM

## 2013-06-05 DIAGNOSIS — Z1509 Genetic susceptibility to other malignant neoplasm: Secondary | ICD-10-CM

## 2013-06-05 LAB — CBC
HCT: 40.9 % (ref 36.0–46.0)
HEMOGLOBIN: 13.8 g/dL (ref 12.0–15.0)
MCH: 31.2 pg (ref 26.0–34.0)
MCHC: 33.7 g/dL (ref 30.0–36.0)
MCV: 92.3 fL (ref 78.0–100.0)
Platelets: 241 10*3/uL (ref 150–400)
RBC: 4.43 MIL/uL (ref 3.87–5.11)
RDW: 15.7 % — ABNORMAL HIGH (ref 11.5–15.5)
WBC: 4.5 10*3/uL (ref 4.0–10.5)

## 2013-06-05 LAB — HEMOGLOBIN A1C
Hgb A1c MFr Bld: 6 % — ABNORMAL HIGH (ref <5.7)
Mean Plasma Glucose: 126 mg/dL — ABNORMAL HIGH (ref <117)

## 2013-06-05 MED ORDER — GADOBENATE DIMEGLUMINE 529 MG/ML IV SOLN
20.0000 mL | Freq: Once | INTRAVENOUS | Status: AC | PRN
  Administered 2013-06-05: 20 mL via INTRAVENOUS

## 2013-06-06 LAB — BASIC METABOLIC PANEL
BUN: 15 mg/dL (ref 6–23)
CO2: 25 meq/L (ref 19–32)
CREATININE: 0.74 mg/dL (ref 0.50–1.10)
Calcium: 9 mg/dL (ref 8.4–10.5)
Chloride: 107 mEq/L (ref 96–112)
GLUCOSE: 100 mg/dL — AB (ref 70–99)
Potassium: 4 mEq/L (ref 3.5–5.3)
SODIUM: 143 meq/L (ref 135–145)

## 2013-06-08 ENCOUNTER — Ambulatory Visit: Payer: BC Managed Care – PPO | Admitting: Family Medicine

## 2013-08-17 ENCOUNTER — Encounter: Payer: Self-pay | Admitting: Family Medicine

## 2013-08-17 ENCOUNTER — Ambulatory Visit (INDEPENDENT_AMBULATORY_CARE_PROVIDER_SITE_OTHER): Payer: BC Managed Care – PPO | Admitting: Family Medicine

## 2013-08-17 VITALS — BP 128/82 | HR 91 | Resp 16 | Ht 65.0 in | Wt 289.0 lb

## 2013-08-17 DIAGNOSIS — M25519 Pain in unspecified shoulder: Secondary | ICD-10-CM

## 2013-08-17 DIAGNOSIS — R5383 Other fatigue: Secondary | ICD-10-CM

## 2013-08-17 DIAGNOSIS — R7309 Other abnormal glucose: Secondary | ICD-10-CM

## 2013-08-17 DIAGNOSIS — E8881 Metabolic syndrome: Secondary | ICD-10-CM

## 2013-08-17 DIAGNOSIS — R51 Headache: Secondary | ICD-10-CM

## 2013-08-17 DIAGNOSIS — Z139 Encounter for screening, unspecified: Secondary | ICD-10-CM

## 2013-08-17 DIAGNOSIS — R5381 Other malaise: Secondary | ICD-10-CM

## 2013-08-17 DIAGNOSIS — E785 Hyperlipidemia, unspecified: Secondary | ICD-10-CM

## 2013-08-17 DIAGNOSIS — M25511 Pain in right shoulder: Secondary | ICD-10-CM

## 2013-08-17 DIAGNOSIS — R7303 Prediabetes: Secondary | ICD-10-CM

## 2013-08-17 MED ORDER — METFORMIN HCL 500 MG PO TABS: 500.0000 mg | ORAL_TABLET | Freq: Two times a day (BID) | ORAL | Status: DC

## 2013-08-17 NOTE — Progress Notes (Signed)
   Subjective:    Patient ID: Kathleen Reynolds, female    DOB: 07-16-1960, 53 y.o.   MRN: [id number]  HPI The PT is here for follow up and re-evaluation of chronic medical conditions, medication management and review of any available recent lab and radiology data.  Preventive health is updated, specifically  Cancer screening and Immunization.   Recent breast MRI reviewed and she has upcoming appt with oncology  The PT denies any adverse reactions to current medications since the last visit.  There are no new concerns. Still working at weight loss and lifestyle change Intermittent knee pain, o buckling, persistent and worsening right shoulder and upper arm pain with limitation in movement in past  3 months    Review of Systems See HPI Denies recent fever or chills. Denies sinus pressure, nasal congestion, ear pain or sore throat. Denies chest congestion, productive cough or wheezing. Denies chest pains, palpitations and leg swelling Denies abdominal pain, nausea, vomiting,diarrhea or constipation.   Denies dysuria, frequency, hesitancy or incontinence.  Denies uncontrolled headaches, seizures, numbness, or tingling.Has had no recent vertigo, but keeps antivert on hand Denies depression, anxiety or insomnia. Denies skin break down or rash.        Objective:   Physical Exam BP 128/82  Pulse 91  Resp 16  Ht 5\' 5"  (1.651 m)  Wt 289 lb (131.09 kg)  BMI 48.09 kg/m2  SpO2 96%  LMP 03/27/2011 Patient alert and oriented and in no cardiopulmonary distress.  HEENT: No facial asymmetry, EOMI,   oropharynx pink and moist.  Neck supple no JVD, no mass.  Chest: Clear to auscultation bilaterally.  CVS: S1, S2 no murmurs, no S3.Regular rate.  ABD: Soft non tender.   Ext: No edema  MS: Adequate ROM spine,  hips and knees.Decreased ROM right shoulder , tender over middle of right  arm  Skin: Intact, no ulcerations or rash noted.  Psych: Good eye contact, normal affect. Memory  intact not anxious or depressed appearing.  CNS: CN 2-12 intact, power,  normal throughout.no focal deficits noted.        Assessment & Plan:  Prediabetes unchanged Patient educated about the importance of limiting  Carbohydrate intake , the need to commit to daily physical activity for a minimum of 30 minutes , and to commit weight loss. The fact that changes in all these areas will reduce or eliminate all together the development of diabetes is stressed.   Increase metformin to twice daily  Shoulder pain, right Progressively worsening , needs ortho eval, referral to Dr Aline Brochure entered  DYSLIPIDEMIA Hyperlipidemia:Low fat diet discussed and encouraged.  Updated lab needed at/ before next visit.   Obesity, morbid, BMI 40.0-49.9 Improved. Pt applauded on succesful weight loss through lifestyle change, and encouraged to continue same. Weight loss goal set for the next several months.   Metabolic syndrome X The increased risk of cardiovascular disease associated with this diagnosis, and the need to consistently work on lifestyle to change this is discussed. Following  a  heart healthy diet ,commitment to 30 minutes of exercise at least 5 days per week, as well as control of blood sugar and cholesterol , and achieving a healthy weight are all the areas to be addressed .   HEADACHE No recent flares, on avg 2 per month, good response to fioricet

## 2013-08-17 NOTE — Patient Instructions (Addendum)
F/U in 4 month, call if you need me before    Congrats on weight loss of 7 pounds, aim for 2 pounds each month and HBA1C of 5.8   Fasting lipid, cmp, TSH, cBC, TsH, vit D in 4 month, before visit  You are referred to Dr Aline Brochure, re shoulder and arm pain  Meds will be refilled

## 2013-08-19 NOTE — Assessment & Plan Note (Signed)
No recent flares, on avg 2 per month, good response to fioricet

## 2013-08-19 NOTE — Assessment & Plan Note (Signed)
Improved. Pt applauded on succesful weight loss through lifestyle change, and encouraged to continue same. Weight loss goal set for the next several months.  

## 2013-08-19 NOTE — Assessment & Plan Note (Signed)
Progressively worsening , needs ortho eval, referral to Dr Aline Brochure entered

## 2013-08-19 NOTE — Assessment & Plan Note (Signed)
unchanged Patient educated about the importance of limiting  Carbohydrate intake , the need to commit to daily physical activity for a minimum of 30 minutes , and to commit weight loss. The fact that changes in all these areas will reduce or eliminate all together the development of diabetes is stressed.   Increase metformin to twice daily

## 2013-08-19 NOTE — Assessment & Plan Note (Signed)
The increased risk of cardiovascular disease associated with this diagnosis, and the need to consistently work on lifestyle to change this is discussed. Following  a  heart healthy diet ,commitment to 30 minutes of exercise at least 5 days per week, as well as control of blood sugar and cholesterol , and achieving a healthy weight are all the areas to be addressed .  

## 2013-08-19 NOTE — Assessment & Plan Note (Signed)
Hyperlipidemia:Low fat diet discussed and encouraged.  Updated lab needed at/ before next visit.  

## 2013-09-07 ENCOUNTER — Encounter: Payer: Self-pay | Admitting: Orthopedic Surgery

## 2013-09-07 ENCOUNTER — Ambulatory Visit (INDEPENDENT_AMBULATORY_CARE_PROVIDER_SITE_OTHER): Payer: BC Managed Care – PPO

## 2013-09-07 ENCOUNTER — Ambulatory Visit (INDEPENDENT_AMBULATORY_CARE_PROVIDER_SITE_OTHER): Payer: BC Managed Care – PPO | Admitting: Orthopedic Surgery

## 2013-09-07 VITALS — BP 122/69 | Ht 65.0 in | Wt 289.0 lb

## 2013-09-07 DIAGNOSIS — M75101 Unspecified rotator cuff tear or rupture of right shoulder, not specified as traumatic: Secondary | ICD-10-CM

## 2013-09-07 DIAGNOSIS — M25511 Pain in right shoulder: Secondary | ICD-10-CM

## 2013-09-07 DIAGNOSIS — M67919 Unspecified disorder of synovium and tendon, unspecified shoulder: Secondary | ICD-10-CM

## 2013-09-07 DIAGNOSIS — M719 Bursopathy, unspecified: Secondary | ICD-10-CM

## 2013-09-07 DIAGNOSIS — M25519 Pain in unspecified shoulder: Secondary | ICD-10-CM

## 2013-09-07 MED ORDER — TRAMADOL-ACETAMINOPHEN 37.5-325 MG PO TABS: 1.0000 | ORAL_TABLET | ORAL | Status: DC | PRN

## 2013-09-07 NOTE — Patient Instructions (Signed)
CALL TO ARRANGE OT AT [facility]

## 2013-09-07 NOTE — Progress Notes (Signed)
53 year old female presents with a 3 to four-month history of atraumatic onset of right shoulder pain associated with stiffness and aching pain around the shoulder. She wakes up in the morning with a stiff shoulder. She has pain 5/10. The nabumetone that she takes for arthritis is not helping. She has not had any other treatment.  Past Medical History  Diagnosis Date  . Migraines   . Breast mass in female     right  . Arthritis   . Hyperlipidemia   . Breast cancer     Right  . Left rotator cuff tear   . Morbid obesity   . Anemia LIFELONG    HEAVY MENSES MAR 2012 HB 13 MCV 86.7 FERRITIN  66   Past Surgical History  Procedure Laterality Date  . Dilation and curettage of uterus    . Colonoscopy  2004 DR. SMITH    No polyp, hemorrhoids  . Colonoscopy  06/09/10    ZOX:WRUEAV adenomas   . Hystersonogram  2009  . Dilation and curettage of uterus  05/2002  . Mastectomy  1999    left breast cancer   . Breast surgery  11/1997    Left breast cancer-mastectomy  . Breast biopsy  02/13/2011    Procedure: BREAST BIOPSY WITH NEEDLE LOCALIZATION;  Surgeon: Adin Hector, MD;  Location: [place];  Service: General;  Laterality: Right;  . Breast surgery  2/13-    rt axilly bx  . Portacath placement  03/27/2011    Procedure: INSERTION PORT-A-CATH;  Surgeon: Adin Hector, MD;  Location: [place];  Service: General;  Laterality: Left;  insert port a cath  . Port-a-cath removal  10/20/2011    Procedure: MINOR REMOVAL PORT-A-CATH;  Surgeon: Adin Hector, MD;  Location: [place];  Service: General;  Laterality: N/A;  Porta-cath removal  left   Review of systems has been recorded reviewed and signed and scanned into the chart  Vital signs BP 122/69  Ht 5\' 5"  (1.651 m)  Wt 289 lb (131.09 kg)  BMI 48.09 kg/m2  LMP 03/27/2011   General appearance: Development, nutrition are normal. Body habitus obese No gross deformities are noted and  grooming normal.  Peripheral vascular system no swelling or varicose veins are noted and pulses are palpable without tenderness, temperature warm to touch no edema.  No palpable lymph nodes are noted in the cervical area or axillae.  The skin overlying the right and left shoulder cervical and thoracic spine is normal without rash, lesion or ulceration  Deep tendon reflexes are normal and equal. And pathologic reflexes such as Hoffman sign are negative.  Sensation remains normal.  The patient is oriented to person place and time, the mood and affect are normal  Ambulation remains normal  Cervical spine no mass or tenderness. Range of motion is normal. Muscle tone is normal. Skin is normal.   Left shoulder  inspection reveals no tenderness or malalignment. There is no crepitation. The range of motion remains full flexion internal and external rotation. Stability tests are normal in abduction external rotation inferior subluxation test as well as the posterior stress test. Manual muscle testing of the supraspinatus, internal and external rotators 5 over 5  Impingement sign is normal, Hawkins maneuver normal, a.c. joint stress test normal.  Right shoulder  There is tenderness around the anterior deltoid and the posterior aspect of the acromion, internal rotation is limited, forward elevation is limited. External rotation is normal. The patient is  stable in abduction external rotation. There is  weakness of the supraspinatus tendon 4/5 with normal internal and external rotation strength are 5/5. The impingement sign is positive The a.c. joint stress test is positive A Hawkins maneuver is positive  X-rays show normal glenohumeral joint but abnormalities in the a.c. Joint  Right rotator cuff syndrome  Our treatment plan is to  Inject the subacromial space Start patient therapy Continue nabumetone Add Ultracet Return 1 month

## 2013-09-19 ENCOUNTER — Ambulatory Visit (HOSPITAL_COMMUNITY)
Admission: RE | Admit: 2013-09-19 | Discharge: 2013-09-19 | Disposition: A | Discharge status: Home / Self Care | Payer: BC Managed Care – PPO | Source: Ambulatory Visit | Attending: Orthopedic Surgery | Admitting: Orthopedic Surgery

## 2013-09-19 DIAGNOSIS — M25519 Pain in unspecified shoulder: Secondary | ICD-10-CM | POA: Diagnosis not present

## 2013-09-19 DIAGNOSIS — M6281 Muscle weakness (generalized): Secondary | ICD-10-CM | POA: Diagnosis not present

## 2013-09-19 DIAGNOSIS — M25619 Stiffness of unspecified shoulder, not elsewhere classified: Secondary | ICD-10-CM | POA: Insufficient documentation

## 2013-09-19 DIAGNOSIS — IMO0001 Reserved for inherently not codable concepts without codable children: Secondary | ICD-10-CM | POA: Insufficient documentation

## 2013-09-19 DIAGNOSIS — M75101 Unspecified rotator cuff tear or rupture of right shoulder, not specified as traumatic: Secondary | ICD-10-CM

## 2013-09-19 DIAGNOSIS — M75100 Unspecified rotator cuff tear or rupture of unspecified shoulder, not specified as traumatic: Secondary | ICD-10-CM | POA: Insufficient documentation

## 2013-09-19 NOTE — Evaluation (Addendum)
Occupational Therapy Evaluation  Patient Details  Name: Kathleen Reynolds MRN: [id number] Date of Birth: [birth year]  Today's Date: 09/19/2013 Time: 1275-1700 OT Time Calculation (min): 40 min OT eval 1749-4496 40'  Visit#: 1 of 8  Re-eval: 10/17/13  Assessment Diagnosis: right rotator cuff syndrome Next MD Visit: 10/12/13 Aline Brochure Prior Therapy: None    Past Medical History:  Past Medical History  Diagnosis Date  . Migraines   . Breast mass in female     right  . Arthritis   . Hyperlipidemia   . Breast cancer     Right  . Left rotator cuff tear   . Morbid obesity   . Anemia LIFELONG    HEAVY MENSES MAR 2012 HB 13 MCV 86.7 FERRITIN  66   Past Surgical History:  Past Surgical History  Procedure Laterality Date  . Dilation and curettage of uterus    . Colonoscopy  2004 DR. SMITH    No polyp, hemorrhoids  . Colonoscopy  06/09/10    PRF:FMBWGY adenomas   . Hystersonogram  2009  . Dilation and curettage of uterus  05/2002  . Mastectomy  1999    left breast cancer   . Breast surgery  11/1997    Left breast cancer-mastectomy  . Breast biopsy  02/13/2011    Procedure: BREAST BIOPSY WITH NEEDLE LOCALIZATION;  Surgeon: Adin Hector, MD;  Location: [place];  Service: General;  Laterality: Right;  . Breast surgery  2/13-    rt axilly bx  . Portacath placement  03/27/2011    Procedure: INSERTION PORT-A-CATH;  Surgeon: Adin Hector, MD;  Location: [place];  Service: General;  Laterality: Left;  insert port a cath  . Port-a-cath removal  10/20/2011    Procedure: MINOR REMOVAL PORT-A-CATH;  Surgeon: Adin Hector, MD;  Location: [place];  Service: General;  Laterality: N/A;  Porta-cath removal  left    Subjective Symptoms/Limitations Symptoms: S: The morning is the worse. I'm really stiff.  Pertinent History: 3 months ago approx. patient woke up and experienced pain and decreased range of motion. Patient was  diagnosed with rotator cuff syndrome. Cortizone shot 09/07/13 was administered. Dr. Aline Brochure has referred patient to occupational therapy for evaluation and treatment.  Limitations: raising arm up overhead, combing hair Special Tests: FOTO score: 52/100 Patient Stated Goals: To regain full motion of arm and decrease pain Pain Assessment Currently in Pain?:  (Pain is greater in the morning)  Precautions/Restrictions  Precautions Precautions: None Precaution Comments: Pt is not allowed to lift over 10#  Balance Screening Balance Screen Has the patient fallen in the past 6 months: No Has the patient had a decrease in activity level because of a fear of falling? : No Is the patient reluctant to leave their home because of a fear of falling? : No  Prior [place] expects to be discharged to:: Private residence Prior Function Level of Independence: Independent with basic ADLs;Independent with gait  Able to Take Stairs?: Yes Driving: Yes Vocation: Full time employment Vocation Requirements: Aide - Private duty, Research officer, political party ADL/Vision/Perception ADL ADL Comments: Difficulty combing, reaching up overhead, lifting heavy items, hooking/unhooking bra from behind, washing back Dominant Hand: Right Vision - History Baseline Vision: No visual deficits  Cognition/Observation Cognition Overall Cognitive Status: Within Functional Limits for tasks assessed Arousal/Alertness: Awake/alert Orientation Level: Oriented X4   Additional Assessments RUE Assessment RUE Assessment:  (assessed seated. IR/ER adducted) RUE AROM (degrees)  Right Shoulder Flexion: 142 Degrees Right Shoulder ABduction: 150 Degrees Right Shoulder Internal Rotation: 90 Degrees Right Shoulder External Rotation: 39 Degrees RUE Strength Right Shoulder Flexion: 3/5 Right Shoulder ABduction: 3/5 Right Shoulder Internal Rotation: 3/5 Right Shoulder External Rotation:  3-/5 Palpation Palpation: Mod fascial restrictions in right upper arm and trapezius region.      Occupational Therapy Assessment and Plan OT Assessment and Plan Clinical Impression Statement: A: Patient is a 53 y/o female s/p right rotator cuff syndrome resulting in increased pain and fascial restrictions and decreased joint mobility and strength causing increased difficulty completing daily tasks.  Pt will benefit from skilled therapeutic intervention in order to improve on the following deficits: Pain;Increased fascial restricitons;Decreased strength;Decreased range of motion Rehab Potential: Excellent OT Frequency: Min 2X/week OT Duration: 4 weeks OT Treatment/Interventions: Self-care/ADL training;Therapeutic exercise;Manual therapy;Patient/family education;Therapeutic activities;Modalities OT Plan: P:  Skilled OT intervention to decrease pain and fascial restrictions and improve pain free mobility in her right shoulder region.  Progress as tolerated.  Treatment Plan:  MFR and manual stretching, AAROM progress to AROM, scapular stability and alignment exercises.      Goals Short Term Goals Time to Complete Short Term Goals: 2 weeks Short Term Goal 1: Patient will be educated on HEP Short Term Goal 2: Patient will decrease pain to a 5/10 during daily tasks.  Long Term Goals Time to Complete Long Term Goals: 4 weeks Long Term Goal 1: Patient will return to highest level of independence with all BADL and work related tasks. Long Term Goal 2: Patient will decrease pain to 3/10 during daily tasks.  Long Term Goal 3: Patient will increase AROM to WNL to increase ability to comb hair. Long Term Goal 4: Patient will increase strength to 5/5 to increase ability to lift items no more than 10#. Long Term Goal 5: Patient will decrease fascial restrictions to trace amount.   Problem List Patient Active Problem List   Diagnosis Date Noted  . Muscle weakness (generalized) 09/19/2013  . Rotator  cuff syndrome 09/19/2013  . Shoulder pain, right 08/17/2013  . Pain in joint, ankle and foot 03/02/2013  . Stiffness of joint, not elsewhere classified, ankle and foot 03/02/2013  . Achilles tendonitis, bilateral 02/23/2013  . Bilateral foot pain 02/23/2013  . Metabolic syndrome X 37/62/8315  . Anemia 02/18/2012  . Obesity, morbid, BMI 40.0-49.9 02/18/2012  . OA (osteoarthritis) of knee 12/29/2011  . Knee pain, left 12/14/2011  . BRCA1 positive 07/01/2011  . Invasive ductal carcinoma of right breast, stage 1 02/18/2011  . 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 Prediabetes 01/22/2011  . Screening for colon cancer 05/29/2010  . History of iron deficiency anemia 05/29/2010  . HEADACHE 01/01/2010  . Thyroid nodule 05/17/2009  . HIP PAIN, RIGHT 01/11/2008  . FATIGUE 01/11/2008  . INGUINAL PAIN, RIGHT 01/11/2008  . RUPTURE ROTATOR CUFF 06/08/2007  . NEOPLASM, MALIGNANT, BREAST, LEFT 02/22/2007  . DYSLIPIDEMIA 02/22/2007    End of Session Activity Tolerance: Patient tolerated treatment well General Behavior During Therapy: [facility] for tasks assessed/performed OT Plan of Care OT Home Exercise Plan: theraband and AROM exercises OT Patient Instructions: handout (scanned) Consulted and Agree with Plan of Care: Patient   Ailene Ravel, OTR/L,CBIS   09/19/2013, 4:37 PM  Physician Documentation Your signature is required to indicate approval of the treatment plan as stated above.  Please sign and either send electronically or make a copy of this report for your files and return this physician signed original.  Please mark one 1.__approve  of plan  2. ___approve of plan with the following conditions.   ______________________________                                                          _____________________ Physician Signature                                                                                                             Date SBNR

## 2013-09-22 ENCOUNTER — Inpatient Hospital Stay (HOSPITAL_COMMUNITY): Admission: RE | Admit: 2013-09-22 | Payer: BC Managed Care – PPO | Source: Ambulatory Visit

## 2013-09-26 ENCOUNTER — Ambulatory Visit (HOSPITAL_COMMUNITY)
Admission: RE | Admit: 2013-09-26 | Discharge: 2013-09-26 | Disposition: A | Discharge status: Home / Self Care | Payer: BC Managed Care – PPO | Source: Ambulatory Visit | Attending: Family Medicine | Admitting: Family Medicine

## 2013-09-26 DIAGNOSIS — IMO0001 Reserved for inherently not codable concepts without codable children: Secondary | ICD-10-CM | POA: Diagnosis not present

## 2013-09-26 NOTE — Progress Notes (Signed)
Occupational Therapy Treatment Patient Details  Name: Kathleen Reynolds MRN: [id number] Date of Birth: 1960/10/11  Today's Date: 09/26/2013 Time: 3729-0211 OT Time Calculation (min): 43 min MFR 155-208 15' Therex 022-336 28'  Visit#: 2 of 8  Re-eval: 10/17/13     Subjective Symptoms/Limitations Symptoms: S: My pain is more by my elbow versus my shoulder.  Pain Assessment Currently in Pain?: No/denies  Precautions/Restrictions  Precautions Precautions: None Precaution Comments: Pt is not allowed to lift over 10#  Exercise/Treatments Supine Protraction: PROM;5 reps;AAROM;12 reps Horizontal ABduction: PROM;5 reps;AAROM;12 reps External Rotation: PROM;5 reps;AAROM;12 reps Internal Rotation: PROM;5 reps;AAROM;12 reps Flexion: PROM;5 reps;AAROM;12 reps ABduction: PROM;5 reps;AAROM;12 reps Standing Protraction: AAROM;12 reps Horizontal ABduction: AAROM;12 reps External Rotation: AAROM;12 reps Internal Rotation: AAROM;12 reps Flexion: AAROM;12 reps ABduction: AAROM;12 reps     Manual Therapy Manual Therapy: Myofascial release Myofascial Release: Myofascial release and manual stretching to right upper arm, trapezius, and scapularis region as well as distal portion of bicep to decrease fascial restrictions and increase joint mobility in a pain free zone.   Occupational Therapy Assessment and Plan OT Assessment and Plan Clinical Impression Statement: A: Initiated myofascial release, manual stretching, AAROM supine and standing. Patient tolerated well with some complaint of muscle fatigue. Patient reports having more pain and tightness on distal postion of right bicep versus right rotator cuff . OT Plan: P: Add proximal shoulder strengthening supine and standing, wall wash    Goals Short Term Goals Time to Complete Short Term Goals: 2 weeks Short Term Goal 1: Patient will be educated on HEP Short Term Goal 1 Progress: Progressing toward goal Short Term Goal 2: Patient  will decrease pain to a 5/10 during daily tasks.  Short Term Goal 2 Progress: Progressing toward goal Long Term Goals Time to Complete Long Term Goals: 4 weeks Long Term Goal 1: Patient will return to highest level of independence with all BADL and work related tasks. Long Term Goal 1 Progress: Progressing toward goal Long Term Goal 2: Patient will decrease pain to 3/10 during daily tasks.  Long Term Goal 2 Progress: Progressing toward goal Long Term Goal 3: Patient will increase AROM to WNL to increase ability to comb hair. Long Term Goal 3 Progress: Progressing toward goal Long Term Goal 4: Patient will increase strength to 5/5 to increase ability to lift items no more than 10#. Long Term Goal 4 Progress: Progressing toward goal Long Term Goal 5: Patient will decrease fascial restrictions to trace amount.  Long Term Goal 5 Progress: Progressing toward goal  Problem List Patient Active Problem List   Diagnosis Date Noted  . Muscle weakness (generalized) 09/19/2013  . Rotator cuff syndrome 09/19/2013  . Shoulder pain, right 08/17/2013  . Pain in joint, ankle and foot 03/02/2013  . Stiffness of joint, not elsewhere classified, ankle and foot 03/02/2013  . Achilles tendonitis, bilateral 02/23/2013  . Bilateral foot pain 02/23/2013  . Metabolic syndrome X 01/25/4974  . Anemia 02/18/2012  . Obesity, morbid, BMI 40.0-49.9 02/18/2012  . OA (osteoarthritis) of knee 12/29/2011  . Knee pain, left 12/14/2011  . BRCA1 positive 07/01/2011  . Invasive ductal carcinoma of right breast, stage 1 02/18/2011  . 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 Prediabetes 01/22/2011  . Screening for colon cancer 05/29/2010  . History of iron deficiency anemia 05/29/2010  . HEADACHE 01/01/2010  . Thyroid nodule 05/17/2009  . HIP PAIN, RIGHT 01/11/2008  . FATIGUE 01/11/2008  . INGUINAL PAIN, RIGHT 01/11/2008  . RUPTURE ROTATOR CUFF 06/08/2007  . NEOPLASM, MALIGNANT,  BREAST, LEFT 02/22/2007  . DYSLIPIDEMIA  02/22/2007    End of Session Activity Tolerance: Patient tolerated treatment well General Behavior During Therapy: [facility] for tasks assessed/performed   Ailene Ravel, OTR/L,CBIS   09/26/2013, 9:55 AM

## 2013-09-29 ENCOUNTER — Ambulatory Visit (HOSPITAL_COMMUNITY)
Admission: RE | Admit: 2013-09-29 | Discharge: 2013-09-29 | Disposition: A | Discharge status: Home / Self Care | Payer: BC Managed Care – PPO | Source: Ambulatory Visit | Attending: Family Medicine | Admitting: Family Medicine

## 2013-09-29 DIAGNOSIS — IMO0001 Reserved for inherently not codable concepts without codable children: Secondary | ICD-10-CM | POA: Diagnosis not present

## 2013-09-29 NOTE — Progress Notes (Signed)
Occupational Therapy Treatment Patient Details  Name: Kathleen Reynolds MRN: [id number] Date of Birth: 1960-12-05  Today's Date: 09/29/2013 Time: 0301-3143 OT Time Calculation (min): 40 min MFR: 8887-5797 9' Therex: 2820-6015 31'  Visit#: 3 of 8  Re-eval: 10/17/13    Subjective Symptoms/Limitations Symptoms: S: The exercises are making my arm a little tired.  Pain Assessment Currently in Pain?: No/denies  Precautions/Restrictions  Precautions Precautions: None Precaution Comments: Pt is not allowed to lift over 10#  Exercise/Treatments Supine Protraction: PROM;5 reps;AAROM;12 reps Horizontal ABduction: PROM;5 reps;AAROM;12 reps External Rotation: PROM;5 reps;AAROM;12 reps Internal Rotation: PROM;5 reps;AAROM;12 reps Flexion: PROM;5 reps;AAROM;12 reps ABduction: PROM;5 reps;AAROM;12 reps    Standing Protraction: AAROM;12 reps Horizontal ABduction: AAROM;12 reps External Rotation: AAROM;12 reps Internal Rotation: AAROM;12 reps Flexion: AAROM;12 reps ABduction: AAROM;12 reps    ROM / Strengthening / Isometric Strengthening Wall Wash: 1' Proximal Shoulder Strengthening, Supine: 12X No rest breaks Proximal Shoulder Strengthening, Seated: 12X No rest breaks          Manual Therapy Manual Therapy: Myofascial release Myofascial Release: Myofascial release and manual stretching to right upper arm, trapezius, and scapularis region as well as distal portion of bicep to decrease fascial restrictions and increase joint mobility in a pain free zone  Occupational Therapy Assessment and Plan OT Assessment and Plan Clinical Impression Statement: A: Added proximal shoulder strengthening in supine and standing. Added wall wash.  Patient tolerated well with some complaint of muscle fatigue. No pain or tightness in right bicep this session. Fascial restrictions noted in deltoid.  OT Plan: P: Attempt AROM supine   Goals Short Term Goals Time to Complete Short Term Goals: 2  weeks Short Term Goal 1: Patient will be educated on HEP Short Term Goal 2: Patient will decrease pain to a 5/10 during daily tasks.  Long Term Goals Time to Complete Long Term Goals: 4 weeks Long Term Goal 1: Patient will return to highest level of independence with all BADL and work related tasks. Long Term Goal 2: Patient will decrease pain to 3/10 during daily tasks.  Long Term Goal 3: Patient will increase AROM to WNL to increase ability to comb hair. Long Term Goal 4: Patient will increase strength to 5/5 to increase ability to lift items no more than 10#. Long Term Goal 5: Patient will decrease fascial restrictions to trace amount.   Problem List Patient Active Problem List   Diagnosis Date Noted  . Muscle weakness (generalized) 09/19/2013  . Rotator cuff syndrome 09/19/2013  . Shoulder pain, right 08/17/2013  . Pain in joint, ankle and foot 03/02/2013  . Stiffness of joint, not elsewhere classified, ankle and foot 03/02/2013  . Achilles tendonitis, bilateral 02/23/2013  . Bilateral foot pain 02/23/2013  . Metabolic syndrome X 61/53/7943  . Anemia 02/18/2012  . Obesity, morbid, BMI 40.0-49.9 02/18/2012  . OA (osteoarthritis) of knee 12/29/2011  . Knee pain, left 12/14/2011  . BRCA1 positive 07/01/2011  . Invasive ductal carcinoma of right breast, stage 1 02/18/2011  . 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 Prediabetes 01/22/2011  . Screening for colon cancer 05/29/2010  . History of iron deficiency anemia 05/29/2010  . HEADACHE 01/01/2010  . Thyroid nodule 05/17/2009  . HIP PAIN, RIGHT 01/11/2008  . FATIGUE 01/11/2008  . INGUINAL PAIN, RIGHT 01/11/2008  . RUPTURE ROTATOR CUFF 06/08/2007  . NEOPLASM, MALIGNANT, BREAST, LEFT 02/22/2007  . DYSLIPIDEMIA 02/22/2007    End of Session Activity Tolerance: Patient tolerated treatment well General Behavior During Therapy: [facility] for tasks assessed/performed  Ailene Ravel, OTR/L,CBIS   09/29/2013, 11:04 AM

## 2013-10-03 ENCOUNTER — Ambulatory Visit (HOSPITAL_COMMUNITY)
Admission: RE | Admit: 2013-10-03 | Discharge: 2013-10-03 | Disposition: A | Discharge status: Home / Self Care | Payer: BC Managed Care – PPO | Source: Ambulatory Visit | Attending: Family Medicine | Admitting: Family Medicine

## 2013-10-03 DIAGNOSIS — M6281 Muscle weakness (generalized): Secondary | ICD-10-CM | POA: Diagnosis not present

## 2013-10-03 DIAGNOSIS — IMO0001 Reserved for inherently not codable concepts without codable children: Secondary | ICD-10-CM | POA: Diagnosis not present

## 2013-10-03 DIAGNOSIS — M25519 Pain in unspecified shoulder: Secondary | ICD-10-CM | POA: Insufficient documentation

## 2013-10-03 DIAGNOSIS — M25619 Stiffness of unspecified shoulder, not elsewhere classified: Secondary | ICD-10-CM | POA: Insufficient documentation

## 2013-10-03 NOTE — Progress Notes (Signed)
Occupational Therapy Treatment Patient Details  Name: Kathleen Reynolds MRN: [id number] Date of Birth: 12-Dec-1960  Today's Date: 10/03/2013 Time: 3335-4562 OT Time Calculation (min): 38 min MFR: 5638-9373 11' Therex: 4287-6811 27'  Visit#: 4 of 8  Re-eval: 10/17/13    Subjective Symptoms/Limitations Symptoms: S: My arm has been feeling alright. It's easier for me to reach higher in my cabinets now.  Pain Assessment Currently in Pain?: No/denies  Precautions/Restrictions  Precautions Precautions: None Precaution Comments: Pt is not allowed to lift over 10#  Exercise/Treatments Supine Protraction: PROM;5 reps;AROM;10 reps Horizontal ABduction: PROM;5 reps;AROM;12 reps External Rotation: PROM;5 reps;AROM;10 reps Internal Rotation: PROM;5 reps;AROM;12 reps Flexion: PROM;5 reps;AROM;12 reps ABduction: PROM;5 reps;AROM;12 reps    Standing Protraction: AROM;12 reps Horizontal ABduction: AROM;12 reps External Rotation: AROM;12 reps Internal Rotation: AROM;12 reps Flexion: AROM;12 reps ABduction: AROM;12 reps Extension: Theraband;12 reps Theraband Level (Shoulder Extension): Level 2 (Red) Row: Theraband;12 reps Theraband Level (Shoulder Row): Level 2 (Red) Retraction: Theraband;12 reps Theraband Level (Shoulder Retraction): Level 2 (Red)    ROM / Strengthening / Isometric Strengthening Wall Wash: 1' X to V Arms: 12X Proximal Shoulder Strengthening, Supine: 12X No rest breaks Proximal Shoulder Strengthening, Seated: 12X No rest breaks          Manual Therapy Manual Therapy: Myofascial release Myofascial Release: Myofascial release and manual stretching to right upper arm, trapezius, and scapularis region as well as distal portion of bicep to decrease fascial restrictions and increase joint mobility in a pain free zone  Occupational Therapy Assessment and Plan OT Assessment and Plan Clinical Impression Statement: A: Added AROM in supine and standing, x to v  arms, and red theraband exercises.  Patient tolerated well. Patient reported muscle fatigue towards the end of the session.  OT Plan: P: Increase AROM repetitions to 15. Attempt UBE bike.    Goals Short Term Goals Time to Complete Short Term Goals: 2 weeks Short Term Goal 1: Patient will be educated on HEP Short Term Goal 1 Progress: Progressing toward goal Short Term Goal 2: Patient will decrease pain to a 5/10 during daily tasks.  Short Term Goal 2 Progress: Progressing toward goal Long Term Goals Time to Complete Long Term Goals: 4 weeks Long Term Goal 1: Patient will return to highest level of independence with all BADL and work related tasks. Long Term Goal 1 Progress: Progressing toward goal Long Term Goal 2: Patient will decrease pain to 3/10 during daily tasks.  Long Term Goal 2 Progress: Progressing toward goal Long Term Goal 3: Patient will increase AROM to WNL to increase ability to comb hair. Long Term Goal 3 Progress: Progressing toward goal Long Term Goal 4: Patient will increase strength to 5/5 to increase ability to lift items no more than 10#. Long Term Goal 4 Progress: Progressing toward goal Long Term Goal 5: Patient will decrease fascial restrictions to trace amount.  Long Term Goal 5 Progress: Progressing toward goal  Problem List Patient Active Problem List   Diagnosis Date Noted  . Muscle weakness (generalized) 09/19/2013  . Rotator cuff syndrome 09/19/2013  . Shoulder pain, right 08/17/2013  . Pain in joint, ankle and foot 03/02/2013  . Stiffness of joint, not elsewhere classified, ankle and foot 03/02/2013  . Achilles tendonitis, bilateral 02/23/2013  . Bilateral foot pain 02/23/2013  . Metabolic syndrome X 57/26/2035  . Anemia 02/18/2012  . Obesity, morbid, BMI 40.0-49.9 02/18/2012  . OA (osteoarthritis) of knee 12/29/2011  . Knee pain, left 12/14/2011  . BRCA1 positive 07/01/2011  .  Invasive ductal carcinoma of right breast, stage 1 02/18/2011  .  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 Prediabetes 01/22/2011  . Screening for colon cancer 05/29/2010  . History of iron deficiency anemia 05/29/2010  . HEADACHE 01/01/2010  . Thyroid nodule 05/17/2009  . HIP PAIN, RIGHT 01/11/2008  . FATIGUE 01/11/2008  . INGUINAL PAIN, RIGHT 01/11/2008  . RUPTURE ROTATOR CUFF 06/08/2007  . NEOPLASM, MALIGNANT, BREAST, LEFT 02/22/2007  . DYSLIPIDEMIA 02/22/2007    End of Session Activity Tolerance: Patient tolerated treatment well General Behavior During Therapy: [facility] for tasks assessed/performed      Ailene Ravel, OTR/L,CBIS   10/03/2013, 9:31 AM

## 2013-10-06 ENCOUNTER — Ambulatory Visit (HOSPITAL_COMMUNITY)
Admission: RE | Admit: 2013-10-06 | Discharge: 2013-10-06 | Disposition: A | Discharge status: Home / Self Care | Payer: BC Managed Care – PPO | Source: Ambulatory Visit | Attending: Family Medicine | Admitting: Family Medicine

## 2013-10-06 DIAGNOSIS — IMO0001 Reserved for inherently not codable concepts without codable children: Secondary | ICD-10-CM | POA: Diagnosis not present

## 2013-10-06 NOTE — Progress Notes (Signed)
Occupational Therapy Treatment Patient Details  Name: Kathleen Reynolds MRN: [id number] Date of Birth: 1960-03-17  Today's Date: 10/06/2013 Time: 2094-7096 OT Time Calculation (min): 34 min MFR: 2836-6294 14' Therex: 7654-6503 20'  Visit#: 5 of 8  Re-eval: 10/17/13     Subjective Symptoms/Limitations Symptoms: S: It's ok today. I cleaned this morning so it's been working today.  Pain Assessment Currently in Pain?: No/denies  Precautions/Restrictions  Precautions Precautions: None Precaution Comments: Pt is not allowed to lift over 10#  Exercise/Treatments Supine Protraction: PROM;5 reps;AROM;15 reps Horizontal ABduction: PROM;5 reps;AROM;15 reps External Rotation: PROM;5 reps;AROM;15 reps Internal Rotation: PROM;5 reps;AROM;15 reps Flexion: PROM;5 reps;AROM;15 reps ABduction: PROM;5 reps;AROM;15 reps   Standing Protraction: AROM;12 reps Horizontal ABduction: AROM;12 reps External Rotation: AROM;12 reps Internal Rotation: AROM;12 reps Flexion: AROM;12 reps ABduction: AROM;12 reps   ROM / Strengthening / Isometric Strengthening Wall Wash: 1:30' X to V Arms: 12X Proximal Shoulder Strengthening, Supine: 12X No rest breaks Proximal Shoulder Strengthening, Seated: 12X No rest breaks        Manual Therapy Manual Therapy: Myofascial release Myofascial Release: Myofascial release and manual stretching to right upper arm, trapezius, and scapularis region as well as distal portion of bicep to decrease fascial restrictions and increase joint mobility in a pain free zone  Occupational Therapy Assessment and Plan OT Assessment and Plan Clinical Impression Statement: A: Increased AROM repetitions to 15 in supine.  Increased wall wash to 1:30'.  Patient tolerated well.  Ended session early due to patient reporting soreness in arm after cleaning at her home this morning.  Encouraged patient to try and rest arm as much as possible this weekend and to apply ice for  pain/soreness.  OT Plan: P: Increase AROM repetitions to 15 in standing. Attempt UBE bike.    Goals Short Term Goals Time to Complete Short Term Goals: 2 weeks Short Term Goal 1: Patient will be educated on HEP Short Term Goal 2: Patient will decrease pain to a 5/10 during daily tasks.  Long Term Goals Time to Complete Long Term Goals: 4 weeks Long Term Goal 1: Patient will return to highest level of independence with all BADL and work related tasks. Long Term Goal 2: Patient will decrease pain to 3/10 during daily tasks.  Long Term Goal 3: Patient will increase AROM to WNL to increase ability to comb hair. Long Term Goal 4: Patient will increase strength to 5/5 to increase ability to lift items no more than 10#. Long Term Goal 5: Patient will decrease fascial restrictions to trace amount.   Problem List Patient Active Problem List   Diagnosis Date Noted  . Muscle weakness (generalized) 09/19/2013  . Rotator cuff syndrome 09/19/2013  . Shoulder pain, right 08/17/2013  . Pain in joint, ankle and foot 03/02/2013  . Stiffness of joint, not elsewhere classified, ankle and foot 03/02/2013  . Achilles tendonitis, bilateral 02/23/2013  . Bilateral foot pain 02/23/2013  . Metabolic syndrome X 54/65/6812  . Anemia 02/18/2012  . Obesity, morbid, BMI 40.0-49.9 02/18/2012  . OA (osteoarthritis) of knee 12/29/2011  . Knee pain, left 12/14/2011  . BRCA1 positive 07/01/2011  . Invasive ductal carcinoma of right breast, stage 1 02/18/2011  . 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 Prediabetes 01/22/2011  . Screening for colon cancer 05/29/2010  . History of iron deficiency anemia 05/29/2010  . HEADACHE 01/01/2010  . Thyroid nodule 05/17/2009  . HIP PAIN, RIGHT 01/11/2008  . FATIGUE 01/11/2008  . INGUINAL PAIN, RIGHT 01/11/2008  . RUPTURE ROTATOR CUFF 06/08/2007  .  NEOPLASM, MALIGNANT, BREAST, LEFT 02/22/2007  . DYSLIPIDEMIA 02/22/2007    End of Session Activity Tolerance: Patient tolerated  treatment well General Behavior During Therapy: [facility] for tasks assessed/performed     Ailene Ravel, OTR/L,CBIS   10/06/2013, 9:25 AM

## 2013-10-10 ENCOUNTER — Encounter (HOSPITAL_COMMUNITY): Payer: BC Managed Care – PPO | Attending: Hematology and Oncology

## 2013-10-10 ENCOUNTER — Ambulatory Visit (HOSPITAL_COMMUNITY)
Admission: RE | Admit: 2013-10-10 | Discharge: 2013-10-10 | Disposition: A | Discharge status: Home / Self Care | Payer: BC Managed Care – PPO | Source: Ambulatory Visit | Attending: Orthopedic Surgery | Admitting: Orthopedic Surgery

## 2013-10-10 DIAGNOSIS — E8881 Metabolic syndrome: Secondary | ICD-10-CM | POA: Insufficient documentation

## 2013-10-10 DIAGNOSIS — C50919 Malignant neoplasm of unspecified site of unspecified female breast: Secondary | ICD-10-CM | POA: Insufficient documentation

## 2013-10-10 DIAGNOSIS — J309 Allergic rhinitis, unspecified: Secondary | ICD-10-CM | POA: Insufficient documentation

## 2013-10-10 DIAGNOSIS — E119 Type 2 diabetes mellitus without complications: Secondary | ICD-10-CM | POA: Diagnosis not present

## 2013-10-10 DIAGNOSIS — Z9010 Acquired absence of unspecified breast and nipple: Secondary | ICD-10-CM | POA: Insufficient documentation

## 2013-10-10 DIAGNOSIS — C50911 Malignant neoplasm of unspecified site of right female breast: Secondary | ICD-10-CM

## 2013-10-10 DIAGNOSIS — R55 Syncope and collapse: Secondary | ICD-10-CM | POA: Diagnosis not present

## 2013-10-10 DIAGNOSIS — IMO0001 Reserved for inherently not codable concepts without codable children: Secondary | ICD-10-CM | POA: Diagnosis not present

## 2013-10-10 LAB — CBC WITH DIFFERENTIAL/PLATELET
Basophils Absolute: 0 10*3/uL (ref 0.0–0.1)
Basophils Relative: 1 % (ref 0–1)
Eosinophils Absolute: 0.1 10*3/uL (ref 0.0–0.7)
Eosinophils Relative: 1 % (ref 0–5)
HCT: 37.4 % (ref 36.0–46.0)
Hemoglobin: 13 g/dL (ref 12.0–15.0)
LYMPHS ABS: 1.4 10*3/uL (ref 0.7–4.0)
Lymphocytes Relative: 38 % (ref 12–46)
MCH: 31.5 pg (ref 26.0–34.0)
MCHC: 34.8 g/dL (ref 30.0–36.0)
MCV: 90.6 fL (ref 78.0–100.0)
Monocytes Absolute: 0.3 10*3/uL (ref 0.1–1.0)
Monocytes Relative: 7 % (ref 3–12)
NEUTROS ABS: 2 10*3/uL (ref 1.7–7.7)
NEUTROS PCT: 53 % (ref 43–77)
Platelets: 208 10*3/uL (ref 150–400)
RBC: 4.13 MIL/uL (ref 3.87–5.11)
RDW: 14.8 % (ref 11.5–15.5)
WBC: 3.8 10*3/uL — ABNORMAL LOW (ref 4.0–10.5)

## 2013-10-10 LAB — COMPREHENSIVE METABOLIC PANEL
ALK PHOS: 66 U/L (ref 39–117)
ALT: 27 U/L (ref 0–35)
AST: 21 U/L (ref 0–37)
Albumin: 4.1 g/dL (ref 3.5–5.2)
Anion gap: 12 (ref 5–15)
BILIRUBIN TOTAL: 0.3 mg/dL (ref 0.3–1.2)
BUN: 15 mg/dL (ref 6–23)
CHLORIDE: 103 meq/L (ref 96–112)
CO2: 24 meq/L (ref 19–32)
Calcium: 9.2 mg/dL (ref 8.4–10.5)
Creatinine, Ser: 0.66 mg/dL (ref 0.50–1.10)
GLUCOSE: 88 mg/dL (ref 70–99)
POTASSIUM: 4.2 meq/L (ref 3.7–5.3)
Sodium: 139 mEq/L (ref 137–147)
Total Protein: 7.5 g/dL (ref 6.0–8.3)

## 2013-10-10 NOTE — Progress Notes (Signed)
[facility]

## 2013-10-10 NOTE — Progress Notes (Signed)
Occupational Therapy Treatment Patient Details  Name: Kathleen Reynolds MRN: [id number] Date of Birth: Jun 17, 1960  Today's Date: 10/10/2013 Time: 2122-4825 OT Time Calculation (min): 42 min Manual therapy 850-905 15' Therapeutic exercises [id number] 67' Visit#: 6 of 8  Re-eval: 10/17/13    Subjective Symptoms/Limitations Symptoms: S:  I can do some things like reach behind my back and up overhead that I couldnt do before.  I still have a hard time reaching all the way up into the cabinet.  Pain Assessment Currently in Pain?: No/denies  Precautions/Restrictions   progress as tolerated  Exercise/Treatments Supine Protraction: PROM;5 reps;AROM;15 reps Horizontal ABduction: PROM;5 reps;AROM;15 reps External Rotation: PROM;5 reps;AROM;15 reps Internal Rotation: PROM;5 reps;AROM;15 reps Flexion: PROM;5 reps;AROM;15 reps ABduction: PROM;5 reps;AROM;15 reps Standing Protraction: AROM;15 reps Horizontal ABduction: AROM;15 reps External Rotation: AROM;15 reps (10 with shoulder abducted, 5 adducted) Internal Rotation: AROM;15 reps Flexion: AROM;15 reps ABduction: AROM;15 reps Extension: Theraband;10 reps Theraband Level (Shoulder Extension): Level 3 (Green) Row: Theraband;10 reps Theraband Level (Shoulder Row): Level 3 (Green) Retraction: Theraband;10 reps Theraband Level (Shoulder Retraction): Level 3 (Green) ROM / Strengthening / Isometric Strengthening UBE (Upper Arm Bike): 2' forward and 2' reverse at 1.0 intensity Wall Wash: 2' "W" Arms: 5X X to V Arms: 12X Proximal Shoulder Strengthening, Supine: 12X No rest breaks Proximal Shoulder Strengthening, Seated: 12X No rest breaks      Manual Therapy Manual Therapy: Myofascial release Myofascial Release: Myofascial release and manual stretching to right upper arm, trapezius, and scapularis region as well as distal portion of bicep to decrease fascial restrictions and increase joint mobility in a pain free zone  Occupational  Therapy Assessment and Plan OT Assessment and Plan Clinical Impression Statement: A: Increased to green mod resist theraband, added UBE and focused on proximal shoulder strengthening.  OT Plan: P:  Reassess for MD visit, attempt strengthening in standing.    Goals Short Term Goals Time to Complete Short Term Goals: 2 weeks Short Term Goal 1: Patient will be educated on HEP Short Term Goal 2: Patient will decrease pain to a 5/10 during daily tasks.  Long Term Goals Time to Complete Long Term Goals: 4 weeks Long Term Goal 1: Patient will return to highest level of independence with all BADL and work related tasks. Long Term Goal 2: Patient will decrease pain to 3/10 during daily tasks.  Long Term Goal 3: Patient will increase AROM to WNL to increase ability to comb hair. Long Term Goal 4: Patient will increase strength to 5/5 to increase ability to lift items no more than 10#. Long Term Goal 5: Patient will decrease fascial restrictions to trace amount.   Problem List Patient Active Problem List   Diagnosis Date Noted  . Muscle weakness (generalized) 09/19/2013  . Rotator cuff syndrome 09/19/2013  . Shoulder pain, right 08/17/2013  . Pain in joint, ankle and foot 03/02/2013  . Stiffness of joint, not elsewhere classified, ankle and foot 03/02/2013  . Achilles tendonitis, bilateral 02/23/2013  . Bilateral foot pain 02/23/2013  . Metabolic syndrome X 88/89/1694  . Anemia 02/18/2012  . Obesity, morbid, BMI 40.0-49.9 02/18/2012  . OA (osteoarthritis) of knee 12/29/2011  . Knee pain, left 12/14/2011  . BRCA1 positive 07/01/2011  . Invasive ductal carcinoma of right breast, stage 1 02/18/2011  . 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 Prediabetes 01/22/2011  . Screening for colon cancer 05/29/2010  . History of iron deficiency anemia 05/29/2010  . HEADACHE 01/01/2010  . Thyroid nodule 05/17/2009  . HIP PAIN, RIGHT 01/11/2008  .  FATIGUE 01/11/2008  . INGUINAL PAIN, RIGHT 01/11/2008  .  RUPTURE ROTATOR CUFF 06/08/2007  . NEOPLASM, MALIGNANT, BREAST, LEFT 02/22/2007  . DYSLIPIDEMIA 02/22/2007    End of Session Activity Tolerance: Patient tolerated treatment well General Behavior During Therapy: [facility] for tasks assessed/performed  [place], OTR/L [phone]  10/10/2013, 9:34 AM

## 2013-10-11 LAB — CEA: CEA: 1.8 ng/mL (ref 0.0–5.0)

## 2013-10-11 LAB — CANCER ANTIGEN 27.29: CA 27.29: 9 U/mL (ref 0–39)

## 2013-10-12 ENCOUNTER — Encounter (HOSPITAL_COMMUNITY): Payer: BC Managed Care – PPO

## 2013-10-12 ENCOUNTER — Encounter (HOSPITAL_BASED_OUTPATIENT_CLINIC_OR_DEPARTMENT_OTHER): Payer: BC Managed Care – PPO

## 2013-10-12 ENCOUNTER — Ambulatory Visit (INDEPENDENT_AMBULATORY_CARE_PROVIDER_SITE_OTHER): Payer: BC Managed Care – PPO | Admitting: Orthopedic Surgery

## 2013-10-12 ENCOUNTER — Encounter: Payer: Self-pay | Admitting: Orthopedic Surgery

## 2013-10-12 ENCOUNTER — Ambulatory Visit (HOSPITAL_COMMUNITY)
Admission: RE | Admit: 2013-10-12 | Discharge: 2013-10-12 | Disposition: A | Discharge status: Home / Self Care | Payer: BC Managed Care – PPO | Source: Ambulatory Visit | Attending: Family Medicine | Admitting: Family Medicine

## 2013-10-12 VITALS — BP 129/72 | Ht 65.0 in | Wt 289.0 lb

## 2013-10-12 VITALS — BP 113/67 | HR 87 | Temp 98.6°F | Resp 18 | Wt 291.0 lb

## 2013-10-12 DIAGNOSIS — M75101 Unspecified rotator cuff tear or rupture of right shoulder, not specified as traumatic: Secondary | ICD-10-CM

## 2013-10-12 DIAGNOSIS — C50919 Malignant neoplasm of unspecified site of unspecified female breast: Secondary | ICD-10-CM

## 2013-10-12 DIAGNOSIS — M67919 Unspecified disorder of synovium and tendon, unspecified shoulder: Secondary | ICD-10-CM

## 2013-10-12 DIAGNOSIS — M719 Bursopathy, unspecified: Secondary | ICD-10-CM

## 2013-10-12 DIAGNOSIS — C50911 Malignant neoplasm of unspecified site of right female breast: Secondary | ICD-10-CM

## 2013-10-12 DIAGNOSIS — IMO0001 Reserved for inherently not codable concepts without codable children: Secondary | ICD-10-CM | POA: Diagnosis not present

## 2013-10-12 NOTE — Patient Instructions (Signed)
[place] Discharge Instructions  RECOMMENDATIONS MADE BY THE CONSULTANT AND ANY TEST RESULTS WILL BE SENT TO YOUR REFERRING PHYSICIAN.  EXAM FINDINGS BY THE PHYSICIAN TODAY AND SIGNS OR SYMPTOMS TO REPORT TO CLINIC OR PRIMARY PHYSICIAN: Exam and findings as discussed by Dr Barnet Glasgow.  Follow-up scheduled in 6 months for office visit and labs.    Thank you for choosing [facility] to provide your oncology and hematology care.  To afford each patient quality time with our providers, please arrive at least 15 minutes before your scheduled appointment time.  With your help, our goal is to use those 15 minutes to complete the necessary work-up to ensure our physicians have the information they need to help with your evaluation and healthcare recommendations.    Effective January 1st, 2014, we ask that you re-schedule your appointment with our physicians should you arrive 10 or more minutes late for your appointment.  We strive to give you quality time with our providers, and arriving late affects you and other patients whose appointments are after yours.    Again, thank you for choosing [facility].  Our hope is that these requests will decrease the amount of time that you wait before being seen by our physicians.       _____________________________________________________________  Should you have questions after your visit to [facility], please contact our office at [phone] between the hours of 8:30 a.m. and 4:30 p.m.  Voicemails left after 4:30 p.m. will not be returned until the following business day.  For prescription refill requests, have your pharmacy contact our office with your prescription refill request.    _______________________________________________________________  We hope that we have given you very good care.  You may receive a patient satisfaction survey in the mail, please complete it and return it as soon as  possible.  We value your feedback!  _______________________________________________________________  Have you asked about our STAR program?  STAR stands for Survivorship Training and Rehabilitation, and this is a nationally recognized cancer care program that focuses on survivorship and rehabilitation.  Cancer and cancer treatments may cause problems, such as, pain, making you feel tired and keeping you from doing the things that you need or want to do. Cancer rehabilitation can help. Our goal is to reduce these troubling effects and help you have the best quality of life possible.  You may receive a survey from a nurse that asks questions about your current state of health.  Based on the survey results, all eligible patients will be referred to the [facility] program for an evaluation so we can better serve you!  A frequently asked questions sheet is available upon request.

## 2013-10-12 NOTE — Evaluation (Signed)
Occupational Therapy Evaluation  Patient Details  Name: Kathleen Reynolds MRN: [id number] Date of Birth: 27-Jun-1960  Today's Date: 10/12/2013 Time: 1884-1660 OT Time Calculation (min): 40 min Manual therapy 805-820 15' ROM 820-845 25' Visit#: 7 of 8  Re-eval: 10/12/13  Assessment Diagnosis: right rotator cuff syndrome   Past Medical History:  Past Medical History  Diagnosis Date  . Migraines   . Breast mass in female     right  . Arthritis   . Hyperlipidemia   . Breast cancer     Right  . Left rotator cuff tear   . Morbid obesity   . Anemia LIF[place]ONG    HEAVY MENSES MAR 2012 HB 13 MCV 86.7 FERRITIN  66   Past Surgical History:  Past Surgical History  Procedure Laterality Date  . Dilation and curettage of uterus    . Colonoscopy  2004 DR. SMITH    No polyp, hemorrhoids  . Colonoscopy  06/09/10    YTK:ZSWFUX adenomas   . Hystersonogram  2009  . Dilation and curettage of uterus  05/2002  . Mastectomy  1999    left breast cancer   . Breast surgery  11/1997    Left breast cancer-mastectomy  . Breast biopsy  02/13/2011    Procedure: BREAST BIOPSY WITH NEEDLE LOCALIZATION;  Surgeon: Kathleen Hector, MD;  Location: [place];  Service: General;  Laterality: Right;  . Breast surgery  2/13-    rt axilly bx  . Portacath placement  03/27/2011    Procedure: INSERTION PORT-A-CATH;  Surgeon: Kathleen Hector, MD;  Location: [place];  Service: General;  Laterality: Left;  insert port a cath  . Port-a-cath removal  10/20/2011    Procedure: MINOR REMOVAL PORT-A-CATH;  Surgeon: Kathleen Hector, MD;  Location: [place];  Service: General;  Laterality: N/A;  Porta-cath removal  left    Subjective  S:  It feels ok today.   Special Tests: Kathleen Reynolds is 61 was 52 at initial evaluation     Additional Assessments RUE AROM (degrees) RUE Overall AROM Comments: assessed in seated ER/IR with shoulder adducted (8/18) Right Shoulder Flexion:  170 Degrees (142) Right Shoulder ABduction: 170 Degrees (150) Right Shoulder Internal Rotation: 93 Degrees (90) Right Shoulder External Rotation: 45 Degrees (39) RUE Strength Right Shoulder Flexion: 5/5 (3/5) Right Shoulder ABduction: 5/5 (3/5) Right Shoulder Internal Rotation: 5/5 (3/5) Right Shoulder External Rotation:  (4+/5 (3-/5)) Palpation Palpation: trace fascial restrictions     Exercise/Treatments     Manual Therapy Myofascial Release: Myofascial release and manual stretching to right upper arm, trapezius, and scapularis region as well as distal portion of bicep to decrease fascial restrictions and increase joint mobility in a pain free zone  Occupational Therapy Assessment and Plan OT Assessment and Plan Clinical Impression Statement: A:  Patient has made significant improvements in AROM and strength.  She is able to complete all desired functional activities.  DC from skilled OT intervention this date.  OT Plan: P:  DC from skilled OT intervention this date.    Goals Short Term Goals Time to Complete Short Term Goals: 2 weeks Short Term Goal 1: Patient will be educated on HEP Short Term Goal 1 Progress: Met Short Term Goal 2: Patient will decrease pain to a 5/10 during daily tasks.  Short Term Goal 2 Progress: Met Long Term Goals Time to Complete Long Term Goals: 4 weeks Long Term Goal 1: Patient will return to highest level of independence with  all BADL and work related tasks. Long Term Goal 1 Progress: Met Long Term Goal 2: Patient will decrease pain to 3/10 during daily tasks.  Long Term Goal 2 Progress: Met Long Term Goal 3: Patient will increase AROM to WNL to increase ability to comb hair. Long Term Goal 3 Progress: Met Long Term Goal 4: Patient will increase strength to 5/5 to increase ability to lift items no more than 10#. Long Term Goal 4 Progress: Met Long Term Goal 5: Patient will decrease fascial restrictions to trace amount.  Long Term Goal 5  Progress: Met  Problem List Patient Active Problem List   Diagnosis Date Noted  . Muscle weakness (generalized) 09/19/2013  . Rotator cuff syndrome 09/19/2013  . Shoulder pain, right 08/17/2013  . Pain in joint, ankle and foot 03/02/2013  . Stiffness of joint, not elsewhere classified, ankle and foot 03/02/2013  . Achilles tendonitis, bilateral 02/23/2013  . Bilateral foot pain 02/23/2013  . Metabolic syndrome X 82/64/1583  . Anemia 02/18/2012  . Obesity, morbid, BMI 40.0-49.9 02/18/2012  . OA (osteoarthritis) of knee 12/29/2011  . Knee pain, left 12/14/2011  . BRCA1 positive 07/01/2011  . Invasive ductal carcinoma of right breast, stage 1 02/18/2011  . 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 Prediabetes 01/22/2011  . Screening for colon cancer 05/29/2010  . History of iron deficiency anemia 05/29/2010  . HEADACHE 01/01/2010  . Thyroid nodule 05/17/2009  . HIP PAIN, RIGHT 01/11/2008  . FATIGUE 01/11/2008  . INGUINAL PAIN, RIGHT 01/11/2008  . RUPTURE ROTATOR CUFF 06/08/2007  . NEOPLASM, MALIGNANT, BREAST, LEFT 02/22/2007  . DYSLIPIDEMIA 02/22/2007    End of Session Activity Tolerance: Patient tolerated treatment well General Behavior During Therapy: [facility] for tasks assessed/performed OT Plan of Care OT Home Exercise Plan: theraband for extension, retraction, row, ER, IR  and cervical stretches  [place], OTR/L [phone]  10/12/2013, 9:26 AM  Physician Documentation Your signature is required to indicate approval of the treatment plan as stated above.  Please sign and either send electronically or make a copy of this report for your files and return this physician signed original.  Please mark one 1.__approve of plan  2. ___approve of plan with the following conditions.   ______________________________                                                          _____________________ Physician Signature                                                                                                              Date

## 2013-10-12 NOTE — Progress Notes (Signed)
Established patient improved followup visit  Chief Complaint  Patient presents with  . Follow-up    1 month follow up Right shoulder s/p injection and therapy    No complaints of pain at this point no complaints of any difficulty with range of motion or strength  Therapy completed  She has good strength in the rotator cuff and equal strength in the rotator cuff.  Full range of motion has been restored  She will followup as needed

## 2013-10-12 NOTE — Addendum Note (Signed)
Addended by: Mellissa Kohut on: 10/12/2013 04:42 PM   Modules accepted: Orders

## 2013-10-12 NOTE — Progress Notes (Signed)
[place]  OFFICE PROGRESS NOTE  [name], MD [address], [address] [place] [address]  DIAGNOSIS: Invasive ductal carcinoma of breast, stage 1, right - Plan: CBC with Differential, Comprehensive metabolic panel, CEA, Cancer antigen 27.29  Chief Complaint  Patient presents with  . Bilateral breast cancer  . BRCA1 positive   CURRENT THERAPY: Watchful expectation  INTERVAL HISTORY: Kathleen Reynolds 53 y.o. female returns  for followup of bilateral breast cancer the first of 1999 the second 2013, both triple negative, both treated with surgery followed by chemotherapy with lumpectomy and radiation on the right and mastectomy on the left, the latter performed in October 1999. She is BRCA1 positive. She has 3 sisters but she is not sure whether they had gone for testing. She has a cousin with breast cancer with mother also had breast cancer. Of course and she is gained 3 pounds since her last visit. Oophorectomy has been postponed in the past because of her weight. She does get hot flashes mainly during the day. She's also had left upper extremity lymphedema but not debilitating. She denies any nausea, vomiting, diarrhea, constipation, peripheral paresthesias, lower 70 swelling or redness, cough, wheezing, sore throat, headache, or seizures.     MEDICAL HISTORY: Past Medical History  Diagnosis Date  . Migraines   . Breast mass in female     right  . Arthritis   . Hyperlipidemia   . Breast cancer     Right  . Left rotator cuff tear   . Morbid obesity   . Anemia LIFELONG    HEAVY MENSES MAR 2012 HB 13 MCV 86.7 FERRITIN  66    INTERIM HISTORY: has NEOPLASM, MALIGNANT, BREAST, LEFT; Thyroid nodule; DYSLIPIDEMIA; HIP PAIN, RIGHT; RUPTURE ROTATOR CUFF; FATIGUE; HEADACHE; INGUINAL PAIN, RIGHT; Screening for colon cancer; History of iron deficiency anemia; Prediabetes; Colonic adenoma; Invasive ductal carcinoma of right breast,  stage 1; BRCA1 positive; Knee pain, left; OA (osteoarthritis) of knee; Anemia; Obesity, morbid, BMI 40.0-49.9; Metabolic syndrome X; Achilles tendonitis, bilateral; Bilateral foot pain; Pain in joint, ankle and foot; Stiffness of joint, not elsewhere classified, ankle and foot; Shoulder pain, right; Muscle weakness (generalized); and Rotator cuff syndrome on her problem list.   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 Fanny Skates on 02/13/2011 followed by sentinel node biopsy which was negative. Her Ki-67 marker was high at 54% HER-2/neu was nonamplified and she was ER/PR negative. She is status post 6 cycles of carboplatin and Taxotere (04/03/11- 07/16/11) and S/P radiation finishing in August 2013. AND History of left-sided breast cancer stage II grade 3 triple negative at that time as well with 7 negative nodes but a 2.3 cm primary with mastectomy on 11/27/1997. She was treated with AC. x4 cycles followed by Taxol x4 cycles thus far without recurrence     ALLERGIES:  is allergic to ibuprofen.  MEDICATIONS: has a current medication list which includes the following prescription(s): butalbital-acetaminophen-caffeine, flaxseed oil, fluticasone, garlic, meclizine, metformin, multivitamin with minerals, phentermine, and tramadol-acetaminophen, and the following Facility-Administered Medications: influenza (>/= 3 years) inactive virus vaccine.  SURGICAL HISTORY:  Past Surgical History  Procedure Laterality Date  . Dilation and curettage of uterus    . Colonoscopy  2004 DR. SMITH    No polyp, hemorrhoids  . Colonoscopy  06/09/10    [facility]:SLHTDS adenomas   . Hystersonogram  2009  .  Dilation and curettage of uterus  05/2002  . Mastectomy  1999    left breast cancer   . Breast surgery  11/1997    Left breast cancer-mastectomy  . Breast biopsy  02/13/2011    Procedure: BREAST BIOPSY WITH NEEDLE LOCALIZATION;  Surgeon: Adin Hector, MD;   Location: [facility];  Service: General;  Laterality: Right;  . Breast surgery  2/13-    rt axilly bx  . Portacath placement  03/27/2011    Procedure: INSERTION PORT-A-CATH;  Surgeon: Adin Hector, MD;  Location: [facility];  Service: General;  Laterality: Left;  insert port a cath  . Port-a-cath removal  10/20/2011    Procedure: MINOR REMOVAL PORT-A-CATH;  Surgeon: Adin Hector, MD;  Location: [facility];  Service: General;  Laterality: N/A;  Porta-cath removal  left    FAMILY HISTORY: family history includes Cancer in her brother and father; Diabetes in her sister; Heart disease in her sister; Hypertension in her mother.  SOCIAL HISTORY:  reports that she has never smoked. She has never used smokeless tobacco. She reports that she drinks about one ounce of alcohol per week. She reports that she does not use illicit drugs.  REVIEW OF SYSTEMS:  Other than that discussed above is noncontributory.  PHYSICAL EXAMINATION: ECOG PERFORMANCE STATUS: 1 - Symptomatic but completely ambulatory  Blood pressure 113/67, pulse 87, temperature 98.6 F (37 C), temperature source Oral, resp. rate 18, weight 291 lb (131.997 kg), last menstrual period 03/27/2011, SpO2 98.00%.  GENERAL:alert, no distress and comfortable. Morbidly obese. SKIN: skin color, texture, turgor are normal, no rashes or significant lesions EYES: PERLA; Conjunctiva are pink and non-injected, sclera clear SINUSES: No redness or tenderness over maxillary or ethmoid sinuses OROPHARYNX:no exudate, no erythema on lips, buccal mucosa, or tongue. NECK: supple, thyroid normal size, non-tender, without nodularity. No masses CHEST: Status post bilateral lumpectomy with no masses. LYMPH:  no palpable lymphadenopathy in the cervical, axillary or inguinal LUNGS: clear to auscultation and percussion with normal breathing effort HEART: regular rate & rhythm and no murmurs. ABDOMEN:abdomen soft,  non-tender and normal bowel sounds. Obese with inability to appreciate organomegaly. MUSCULOSKELETAL:no cyanosis of digits and no clubbing. Range of motion normal. +1 left upper extremity lymphedema. NEURO: alert & oriented x 3 with fluent speech, no focal motor/sensory deficits   LABORATORY DATA: Lab on 10/10/2013  Component Date Value Ref Range Status  . WBC 10/10/2013 3.8* 4.0 - 10.5 K/uL Final  . RBC 10/10/2013 4.13  3.87 - 5.11 MIL/uL Final  . Hemoglobin 10/10/2013 13.0  12.0 - 15.0 g/dL Final  . HCT 10/10/2013 37.4  36.0 - 46.0 % Final  . MCV 10/10/2013 90.6  78.0 - 100.0 fL Final  . MCH 10/10/2013 31.5  26.0 - 34.0 pg Final  . MCHC 10/10/2013 34.8  30.0 - 36.0 g/dL Final  . RDW 10/10/2013 14.8  11.5 - 15.5 % Final  . Platelets 10/10/2013 208  150 - 400 K/uL Final  . Neutrophils Relative % 10/10/2013 53  43 - 77 % Final  . Neutro Abs 10/10/2013 2.0  1.7 - 7.7 K/uL Final  . Lymphocytes Relative 10/10/2013 38  12 - 46 % Final  . Lymphs Abs 10/10/2013 1.4  0.7 - 4.0 K/uL Final  . Monocytes Relative 10/10/2013 7  3 - 12 % Final  . Monocytes Absolute 10/10/2013 0.3  0.1 - 1.0 K/uL Final  . Eosinophils Relative 10/10/2013 1  0 - 5 % Final  .  Eosinophils Absolute 10/10/2013 0.1  0.0 - 0.7 K/uL Final  . Basophils Relative 10/10/2013 1  0 - 1 % Final  . Basophils Absolute 10/10/2013 0.0  0.0 - 0.1 K/uL Final  . Sodium 10/10/2013 139  137 - 147 mEq/L Final  . Potassium 10/10/2013 4.2  3.7 - 5.3 mEq/L Final  . Chloride 10/10/2013 103  96 - 112 mEq/L Final  . CO2 10/10/2013 24  19 - 32 mEq/L Final  . Glucose, Bld 10/10/2013 88  70 - 99 mg/dL Final  . BUN 10/10/2013 15  6 - 23 mg/dL Final  . Creatinine, Ser 10/10/2013 0.66  0.50 - 1.10 mg/dL Final  . Calcium 10/10/2013 9.2  8.4 - 10.5 mg/dL Final  . Total Protein 10/10/2013 7.5  6.0 - 8.3 g/dL Final  . Albumin 10/10/2013 4.1  3.5 - 5.2 g/dL Final  . AST 10/10/2013 21  0 - 37 U/L Final  . ALT 10/10/2013 27  0 - 35 U/L Final  . Alkaline  Phosphatase 10/10/2013 66  39 - 117 U/L Final  . Total Bilirubin 10/10/2013 0.3  0.3 - 1.2 mg/dL Final  . GFR calc non Af Amer 10/10/2013 >90  >90 mL/min Final  . GFR calc Af Amer 10/10/2013 >90  >90 mL/min Final   Comment: (NOTE)                          The eGFR has been calculated using the CKD EPI equation.                          This calculation has not been validated in all clinical situations.                          eGFR's persistently <90 mL/min signify possible Chronic Kidney                          Disease.  . Anion gap 10/10/2013 12  5 - 15 Final  . CEA 10/10/2013 1.8  0.0 - 5.0 ng/mL Final   Performed at [facility]  . CA 27.29 10/10/2013 9  0 - 39 U/mL Final   Performed at [facility]: No new pathology.  Urinalysis No results found for this basename: colorurine,  appearanceur,  labspec,  phurine,  glucoseu,  hgbur,  bilirubinur,  ketonesur,  proteinur,  urobilinogen,  nitrite,  leukocytesur    RADIOGRAPHIC STUDIES: MR Breast Bilateral W Wo Contrast Status: Final result         PACS Images    Show images for MR Breast Bilateral W Wo Contrast         Study Result    CLINICAL DATA: History of left breast cancer treated with  mastectomy in 2000. History of right breast cancer treated with  lumpectomy radiation and chemotherapy in 2013. BRCA-1 gene mutation  positive.  LABS: Not applicable  EXAM:  BILATERAL BREAST MRI WITH AND WITHOUT CONTRAST  TECHNIQUE:  Multiplanar, multisequence MR images of both breasts were obtained  prior to and following the intravenous administration of 62m of  MultiHance  THREE-DIMENSIONAL MR IMAGE RENDERING ON INDEPENDENT WORKSTATION:  Three-dimensional MR images were rendered by post-processing of the  original MR data on an independent workstation. The  three-dimensional MR images were interpreted, and findings are  reported in the following complete  MRI report for this study. Three    dimensional images were evaluated at the independent DynaCad  workstation  COMPARISON: Previous exams  FINDINGS:  Breast composition: b. Scattered fibroglandular tissue.  Background parenchymal enhancement: Minimal  Right breast: Postoperative changes are identified. No suspicious  enhancement.  Left breast: Status post mastectomy. Chest wall is unremarkable in  appearance.  Lymph nodes: No abnormal appearing lymph nodes.  Ancillary findings: None.  IMPRESSION:  1. Expected postoperative changes bilaterally.  2. No MRI evidence for malignancy in the right breast or the left  chest wall.  RECOMMENDATION:  1. Continued annual screening is recommended. Next screening  mammogram is recommended in November 2015.  2. Based on the recommendations of the [place],  annual MRI is recommended based on the patient's history of BRCA-1  gene mutation.  BI-RADS CATEGORY 2: Benign.  Electronically Signed  By: Shon Hale M.D.  On: 06/09/2013        ASSESSMENT:  #1.Right sided triple negative breast cancer 9 mm in size status post wire localization and removal by Dr. Fanny Skates on 02/13/2011 followed by sentinel node biopsy which was negative. Her Ki-67 marker was high at 54% HER-2/neu was nonamplified and she was ER/PR negative. She is now status post 6 cycles of carboplatin and Taxotere and S/P radiation. No evidence of recurrence.  #2.Left-sided breast cancer stage II grade 3 triple negative at that time as well with 7 negative nodes but a 2.3 cm primary with mastectomy on 11/27/1997. She was treated with AC. x4 cycles followed by Taxol x4 cycles thus far without recurrence.  #3. BRCA1 positivity.  #4. Morbid obesity, attending to lose weight in anticipation of undergoing bilateral oophorectomy.  #5. Symptomatic allergic rhinitis.  #6. Diabetes mellitus with metabolic syndrome. #7. Vasomotor instability.    PLAN:  #1. Weight reduction. Perhaps introduction of therapy  with  may be helpful. #2. Repeat mammogram in November 2015. #3. Followup in 6 months with CBC, chem profile, CEA, CA 27-29 patient was offered venlafaxine for hot flashes but will think about it. #4. Gynecologic followup for prophylactic oophorectomy.   All questions were answered. The patient knows to call the clinic with any problems, questions or concerns. We can certainly see the patient much sooner if necessary.   I spent 25 minutes counseling the patient face to face. The total time spent in the appointment was 30 minutes.    Doroteo Bradford, MD 10/12/2013 1:39 PM  DISCLAIMER:  This note was dictated with voice recognition software.  Similar sounding words can inadvertently be transcribed inaccurately and may not be corrected upon review.

## 2013-10-12 NOTE — Progress Notes (Signed)
STAR Program Physical Impairment and Functional Assessment Screening Tool  1. Are you having any pain, including headaches, joint pain, or muscle pain (upper body = OT; lower body = PT)?  Yes, but I hand this before my cancer diagnosis.  2. Do your hands and/or feet feel numb or tingle (PT)?  Yes, but I hand this before my cancer diagnosis.  3. Does any part of your body feel swollen or larger than usual (upper body = OT; lower body = PT)?  Yes, but I hand this before my cancer diagnosis.  4. Are you so tired that you cannot do the things you want or need to do (PT or OT)?  No  5. Are you feeling weak or are you having trouble moving any part of your body (PT/OT)?  No  6. Are you having trouble concentrating, thinking, or remembering things (OT/ST)?  No  7. Are you having trouble moving around or feel like you might trip or fall (PT)?  No  8. Are you having trouble swallowing (ST)?  No  9. Are you having trouble speaking (ST)?  No  10. Are you having trouble with going or getting to the bathroom (OT)?  No  11. Are you having trouble with your sexual function (OT)?  No  12. Are you having trouble lifting things, even just your arms (OT/PT)?  Yes, but I hand this before my cancer diagnosis.  48. Are you having trouble taking care of yourself as in dressing or bathing (OT)?  No  14. Are you having trouble with daily tasks like chores or shopping (OT)?  No  15. Are you having trouble driving (OT)?  No  16. Are you having trouble returning to work or completing your tasks at work (OT)?  No  Other concerns:    Legend: OT = Occupational Therapy PT = Physical Therapy ST = Speech Therapy

## 2013-11-21 ENCOUNTER — Other Ambulatory Visit: Payer: Self-pay | Admitting: Family Medicine

## 2013-11-21 DIAGNOSIS — Z853 Personal history of malignant neoplasm of breast: Secondary | ICD-10-CM

## 2013-12-18 ENCOUNTER — Other Ambulatory Visit: Payer: Self-pay | Admitting: Family Medicine

## 2013-12-18 LAB — CBC WITH DIFFERENTIAL/PLATELET
BASOS PCT: 0 % (ref 0–1)
Basophils Absolute: 0 10*3/uL (ref 0.0–0.1)
Eosinophils Absolute: 0 10*3/uL (ref 0.0–0.7)
Eosinophils Relative: 1 % (ref 0–5)
HCT: 40.6 % (ref 36.0–46.0)
Hemoglobin: 13.5 g/dL (ref 12.0–15.0)
LYMPHS PCT: 32 % (ref 12–46)
Lymphs Abs: 1.3 10*3/uL (ref 0.7–4.0)
MCH: 30.8 pg (ref 26.0–34.0)
MCHC: 33.3 g/dL (ref 30.0–36.0)
MCV: 92.5 fL (ref 78.0–100.0)
Monocytes Absolute: 0.2 10*3/uL (ref 0.1–1.0)
Monocytes Relative: 6 % (ref 3–12)
NEUTROS PCT: 61 % (ref 43–77)
Neutro Abs: 2.4 10*3/uL (ref 1.7–7.7)
PLATELETS: 226 10*3/uL (ref 150–400)
RBC: 4.39 MIL/uL (ref 3.87–5.11)
RDW: 15.5 % (ref 11.5–15.5)
WBC: 4 10*3/uL (ref 4.0–10.5)

## 2013-12-18 LAB — COMPREHENSIVE METABOLIC PANEL WITH GFR
ALT: 22 U/L (ref 0–35)
AST: 17 U/L (ref 0–37)
Albumin: 4.4 g/dL (ref 3.5–5.2)
Alkaline Phosphatase: 71 U/L (ref 39–117)
BUN: 17 mg/dL (ref 6–23)
CO2: 20 meq/L (ref 19–32)
Calcium: 9.8 mg/dL (ref 8.4–10.5)
Chloride: 106 meq/L (ref 96–112)
Creat: 0.75 mg/dL (ref 0.50–1.10)
Glucose, Bld: 90 mg/dL (ref 70–99)
Potassium: 4.1 meq/L (ref 3.5–5.3)
Sodium: 140 meq/L (ref 135–145)
Total Bilirubin: 0.3 mg/dL (ref 0.2–1.2)
Total Protein: 7.8 g/dL (ref 6.0–8.3)

## 2013-12-18 LAB — LIPID PANEL
Cholesterol: 201 mg/dL — ABNORMAL HIGH (ref 0–200)
HDL: 57 mg/dL (ref >39)
LDL Cholesterol: 116 mg/dL — ABNORMAL HIGH (ref 0–99)
Total CHOL/HDL Ratio: 3.5 Ratio
Triglycerides: 138 mg/dL (ref <150)
VLDL: 28 mg/dL (ref 0–40)

## 2013-12-19 LAB — VITAMIN D 25 HYDROXY (VIT D DEFICIENCY, FRACTURES): Vit D, 25-Hydroxy: 30 ng/mL (ref 30–100)

## 2013-12-19 LAB — TSH: TSH: 3.414 u[IU]/mL (ref 0.350–4.500)

## 2013-12-19 LAB — HEMOGLOBIN A1C
Hgb A1c MFr Bld: 5.9 % — ABNORMAL HIGH (ref <5.7)
MEAN PLASMA GLUCOSE: 123 mg/dL — AB (ref <117)

## 2013-12-20 ENCOUNTER — Ambulatory Visit (INDEPENDENT_AMBULATORY_CARE_PROVIDER_SITE_OTHER): Payer: BC Managed Care – PPO | Admitting: Family Medicine

## 2013-12-20 VITALS — BP 112/72 | HR 88 | Resp 14 | Ht 65.0 in | Wt 294.4 lb

## 2013-12-20 DIAGNOSIS — E8881 Metabolic syndrome: Secondary | ICD-10-CM

## 2013-12-20 DIAGNOSIS — F418 Other specified anxiety disorders: Secondary | ICD-10-CM | POA: Insufficient documentation

## 2013-12-20 DIAGNOSIS — Z23 Encounter for immunization: Secondary | ICD-10-CM

## 2013-12-20 DIAGNOSIS — M79631 Pain in right forearm: Secondary | ICD-10-CM

## 2013-12-20 DIAGNOSIS — M79641 Pain in right hand: Secondary | ICD-10-CM | POA: Insufficient documentation

## 2013-12-20 MED ORDER — VENLAFAXINE HCL ER 37.5 MG PO CP24: 37.5000 mg | ORAL_CAPSULE | Freq: Every day | ORAL | Status: DC

## 2013-12-20 NOTE — Patient Instructions (Addendum)
F/u in 2. 5 month, call if you need me before  New for depression is effexor 1 daily  Pls get xray of right forearm and you are also referred to Dr Aline Brochure re right forearm pain   You are referred for counseling as discussed and start making the changes slowly in your life as we discussed  Flu vaccine today  All the best for the season and 2016!

## 2013-12-22 ENCOUNTER — Ambulatory Visit
Admission: RE | Admit: 2013-12-22 | Discharge: 2013-12-22 | Disposition: A | Discharge status: Home / Self Care | Payer: BC Managed Care – PPO | Source: Ambulatory Visit | Attending: Family Medicine | Admitting: Family Medicine

## 2013-12-22 DIAGNOSIS — Z853 Personal history of malignant neoplasm of breast: Secondary | ICD-10-CM

## 2013-12-24 ENCOUNTER — Encounter: Payer: Self-pay | Admitting: Family Medicine

## 2013-12-24 DIAGNOSIS — Z23 Encounter for immunization: Secondary | ICD-10-CM | POA: Insufficient documentation

## 2013-12-24 NOTE — Assessment & Plan Note (Signed)
Vaccine administered at visit.  

## 2013-12-24 NOTE — Assessment & Plan Note (Signed)
Progressive pain  and reduced ROM right forearm x 4 to 6 monht, xrays and eval by ortho

## 2013-12-24 NOTE — Assessment & Plan Note (Signed)
Deteriorated. Patient re-educated about  the importance of commitment to a  minimum of 150 minutes of exercise per week. The importance of healthy food choices with portion control discussed. Encouraged to start a food diary, count calories and to consider  joining a support group. Sample diet sheets offered. Goals set by the patient for the next several months.    

## 2013-12-24 NOTE — Progress Notes (Signed)
   Subjective:    Patient ID: Kathleen Reynolds, female    DOB: [birth year], 53 y.o.   MRN: [id number]  HPI The PT is here for follow up and re-evaluation of chronic medical conditions, medication management and review of any available recent lab and radiology data.  Preventive health is updated, specifically  Cancer screening and Immunization.   6 month h/o ight forearm pain aggravated by movement, no inciting trauma known Frustrated with weight gain and depressed, wants help, not suicidal or homicidal      Review of Systems See HPI Denies recent fever or chills. Denies sinus pressure, nasal congestion, ear pain or sore throat. Denies chest congestion, productive cough or wheezing. Denies chest pains, palpitations and leg swelling Denies abdominal pain, nausea, vomiting,diarrhea or constipation.   Denies dysuria, frequency, hesitancy or incontinence. Denies headaches, seizures, numbness, or tingling.  Denies skin break down or rash.         Objective:   Physical Exam  BP 112/72 mmHg  Pulse 88  Resp 14  Ht 5\' 5"  (1.651 m)  Wt 294 lb 6.4 oz (133.539 kg)  BMI 48.99 kg/m2  SpO2 96%  LMP 03/27/2011  Patient alert and oriented and in no cardiopulmonary distress.Taqerful  HEENT: No facial asymmetry, EOMI,   oropharynx pink and moist.  Neck supple no JVD, no mass.  Chest: Clear to auscultation bilaterally.  CVS: S1, S2 no murmurs, no S3.Regular rate.  ABD: Soft non tender.   Ext: No edema  MS: Adequate though reduced  ROM spine, shoulders, hips and knees.Decreased ROM right forearm  Skin: Intact, no ulcerations or rash noted.  Psych: Good eye contact, normal affect. Memory intact , tearful, anxious  anxious and  depressed appearing.  CNS: CN 2-12 intact, power,  normal throughout.no focal deficits noted.      Assessment & Plan:  Right forearm pain Progressive pain  and reduced ROM right forearm x 4 to 6 monht, xrays and eval by ortho  Depression with  anxiety Pt to start medciation and is referred for therapy also She is not suicidal or homicidal but extremely depressed  Obesity, morbid, BMI 40.0-49.9 Deteriorated. Patient re-educated about  the importance of commitment to a  minimum of 150 minutes of exercise per week. The importance of healthy food choices with portion control discussed. Encouraged to start a food diary, count calories and to consider  joining a support group. Sample diet sheets offered. Goals set by the patient for the next several months.     Need for prophylactic vaccination and inoculation against influenza Vaccine administered at visit.

## 2013-12-24 NOTE — Assessment & Plan Note (Signed)
Pt to start medciation and is referred for therapy also She is not suicidal or homicidal but extremely depressed

## 2013-12-24 NOTE — Assessment & Plan Note (Signed)
The increased risk of cardiovascular disease associated with this diagnosis, and the need to consistently work on lifestyle to change this is discussed. Following  a  heart healthy diet ,commitment to 30 minutes of exercise at least 5 days per week, as well as control of blood sugar and cholesterol , and achieving a healthy weight are all the areas to be addressed .  

## 2014-01-09 ENCOUNTER — Ambulatory Visit (INDEPENDENT_AMBULATORY_CARE_PROVIDER_SITE_OTHER): Payer: BC Managed Care – PPO | Admitting: Orthopedic Surgery

## 2014-01-09 ENCOUNTER — Ambulatory Visit (INDEPENDENT_AMBULATORY_CARE_PROVIDER_SITE_OTHER): Payer: BC Managed Care – PPO

## 2014-01-09 ENCOUNTER — Encounter: Payer: Self-pay | Admitting: Orthopedic Surgery

## 2014-01-09 VITALS — BP 109/61 | Ht 65.0 in | Wt 294.4 lb

## 2014-01-09 DIAGNOSIS — M25531 Pain in right wrist: Secondary | ICD-10-CM

## 2014-01-09 DIAGNOSIS — M654 Radial styloid tenosynovitis [de Quervain]: Secondary | ICD-10-CM

## 2014-01-09 NOTE — Patient Instructions (Signed)
De Quervain's Tenosynovitis De Quervain's tenosynovitis involves inflammation of one or two tendon linings (sheaths) or strain of one or two tendons to the thumb: extensor pollicis brevis (EPB), or abductor pollicis longus (APL). This causes pain on the side of the wrist and base of the thumb. Tendon sheaths secrete a fluid that lubricates the tendon, allowing the tendon to move smoothly. When the sheath becomes inflamed, the tendon cannot move freely in the sheath. Both the EPB and APL tendons are important for proper use of the hand. The EPB tendon is important for straightening the thumb. The APL tendon is important for moving the thumb away from the index finger (abducting). The two tendons pass through a small tube (canal) in the wrist, near the base of the thumb. When the tendons become inflamed, pain is usually felt in this area. SYMPTOMS   Pain, tenderness, swelling, warmth, or redness over the base of the thumb and thumb side of the wrist.  Pain that gets worse when straightening the thumb.  Pain that gets worse when moving the thumb away from the index finger, against resistance.  Pain with pinching or gripping.  Locking or catching of the thumb.  Limited motion of the thumb.  Crackling sound (crepitation) when the tendon or thumb is moved or touched.  Fluid-filled cyst in the area of the base of the thumb. CAUSES   Tenosynovitis is often linked with overuse of the wrist.  Tenosynovitis may be caused by repeated injury to the thumb muscle and tendon units, and with repeated motions of the hand and wrist, due to friction of the tendon within the lining (sheath).  Tenosynovitis may also be due to a sudden increase in activity or change in activity. RISK INCREASES WITH:  Sports that involve repeated hand and wrist motions (golf, bowling, tennis, squash, racquetball).  Heavy labor.  Poor physical wrist strength and flexibility.  Failure to warm up properly before practice or  play.  Female gender.  New mothers who hold their baby's head for long periods or lift infants with thumbs in the infant's armpit (axilla). PREVENTION  Warm up and stretch properly before practice or competition.  Allow enough time for rest and recovery between practices and competition.  Maintain appropriate conditioning:  Cardiovascular fitness.  Forearm, wrist, and hand flexibility.  Muscle strength and endurance.  Use proper exercise technique. PROGNOSIS  This condition is usually curable within 6 weeks, if treated properly with non-surgical treatment and resting of the affected area.  RELATED COMPLICATIONS   Longer healing time if not properly treated or if not given enough time to heal.  Chronic inflammation, causing recurring symptoms of tenosynovitis. Permanent pain or restriction of movement.  Risks of surgery: infection, bleeding, injury to nerves (numbness of the thumb), continued pain, incomplete release of the tendon sheath, recurring symptoms, cutting of the tendons, tendons sliding out of position, weakness of the thumb, thumb stiffness. TREATMENT  First, treatment involves the use of medicine and ice, to reduce pain and inflammation. Patients are encouraged to stop or modify activities that aggravate the injury. Stretching and strengthening exercises may be advised. Exercises may be completed at home or with a therapist. You may be fitted with a brace or splint, to limit motion and allow the injury to heal. Your caregiver may also choose to give you a corticosteroid injection, to reduce the pain and inflammation. If non-surgical treatment is not successful, surgery may be needed. Most tenosynovitis surgeries are done as outpatient procedures (you go home the   same day). Surgery may involve local, regional (whole arm), or general anesthesia.  MEDICATION   If pain medicine is needed, nonsteroidal anti-inflammatory medicines (aspirin and ibuprofen), or other minor pain  relievers (acetaminophen), are often advised.  Do not take pain medicine for 7 days before surgery.  Prescription pain relievers are often prescribed only after surgery. Use only as directed and only as much as you need.  Corticosteroid injections may be given if your caregiver thinks they are needed. There is a limited number of times these injections may be given. COLD THERAPY   Cold treatment (icing) should be applied for 10 to 15 minutes every 2 to 3 hours for inflammation and pain, and immediately after activity that aggravates your symptoms. Use ice packs or an ice massage. SEEK MEDICAL CARE IF:   Symptoms get worse or do not improve in 2 to 4 weeks, despite treatment.  You experience pain, numbness, or coldness in the hand.  Blue, gray, or dark color appears in the fingernails.  Any of the following occur after surgery: increased pain, swelling, redness, drainage of fluids, bleeding in the affected area, or signs of infection.  New, unexplained symptoms develop. (Drugs used in treatment may produce side effects.) Document Released: 01/19/2005 Document Revised: 04/13/2011 Document Reviewed: 05/03/2008 ExitCare Patient Information 2015 [employer]. This information is not intended to replace advice given to you by your health care provider. Make sure you discuss any questions you have with your health care provider.   

## 2014-01-09 NOTE — Progress Notes (Signed)
Patient ID: Carrina Schoenberger, female   DOB: 10-15-1960, 53 y.o.   MRN: [id number] Patient ID: Connie Lasater, female   DOB: 13-Nov-1960, 53 y.o.   MRN: [id number]  Chief Complaint  Patient presents with  . Wrist Pain    right wrist pain, REF SIMPSON    HPI Gilbert Donofrio is a 53 y.o. female. HPI  Past Medical History  Diagnosis Date  . Migraines   . Breast mass in female     right  . Arthritis   . Hyperlipidemia   . Breast cancer     Right  . Left rotator cuff tear   . Morbid obesity   . Anemia LIFELONG    HEAVY MENSES MAR 2012 HB 13 MCV 86.7 FERRITIN  66    Past Surgical History  Procedure Laterality Date  . Dilation and curettage of uterus    . Colonoscopy  2004 DR. SMITH    No polyp, hemorrhoids  . Colonoscopy  06/09/10    [id number] adenomas   . Hystersonogram  2009  . Dilation and curettage of uterus  05/2002  . Mastectomy  1999    left breast cancer   . Breast surgery  11/1997    Left breast cancer-mastectomy  . Breast biopsy  02/13/2011    Procedure: BREAST BIOPSY WITH NEEDLE LOCALIZATION;  Surgeon: Adin Hector, MD;  Location: [place];  Service: General;  Laterality: Right;  . Breast surgery  2/13-    rt axilly bx  . Portacath placement  03/27/2011    Procedure: INSERTION PORT-A-CATH;  Surgeon: Adin Hector, MD;  Location: [place];  Service: General;  Laterality: Left;  insert port a cath  . Port-a-cath removal  10/20/2011    Procedure: MINOR REMOVAL PORT-A-CATH;  Surgeon: Adin Hector, MD;  Location: [place];  Service: General;  Laterality: N/A;  Porta-cath removal  left    Family History  Problem Relation Age of Onset  . Hypertension Mother   . Cancer Father     lung cancer  . Diabetes Sister   . Cancer Brother     colon cancer  . Heart disease Sister     heart attack    Social History History  Substance Use Topics  . Smoking status: Never Smoker   . Smokeless tobacco: Never  Used  . Alcohol Use: 1.0 oz/week    2 drink(s) per week     Comment: occasional    Allergies  Allergen Reactions  . Ibuprofen Itching    Current Outpatient Prescriptions  Medication Sig Dispense Refill  . butalbital-acetaminophen-caffeine (FIORICET, ESGIC) 50-325-40 MG per tablet Take 1 tablet by mouth 2 (two) times daily as needed. For migraine 14 tablet 3  . Garlic 300 MG TABS Take 1 tablet by mouth daily.    . meclizine (ANTIVERT) 25 MG tablet Take 1 tablet (25 mg total) by mouth 3 (three) times daily as needed. For dizziness 30 tablet 3  . metFORMIN (GLUCOPHAGE) 500 MG tablet Take 1 tablet (500 mg total) by mouth 2 (two) times daily with a meal. 60 tablet 5  . Multiple Vitamin (MULITIVITAMIN WITH MINERALS) TABS Take 1 tablet by mouth daily.    . nabumetone (RELAFEN) 500 MG tablet Take 500 mg by mouth daily.    . phentermine 37.5 MG capsule Take 1 capsule (37.5 mg total) by mouth every morning. 30 capsule 3  . Flaxseed, Linseed, (FLAXSEED OIL) 1000 MG CAPS Take 2,000 mg by mouth daily.     Marland Kitchen  fluticasone (FLONASE) 50 MCG/ACT nasal spray Place 2 sprays into the nose daily. 16 g 3  . traMADol-acetaminophen (ULTRACET) 37.5-325 MG per tablet Take 1 tablet by mouth every 4 (four) hours as needed. (Patient not taking: Reported on 01/09/2014) 60 tablet 1  . venlafaxine XR (EFFEXOR-XR) 37.5 MG 24 hr capsule Take 1 capsule (37.5 mg total) by mouth daily with breakfast. (Patient not taking: Reported on 01/09/2014) 30 capsule 5   Current Facility-Administered Medications  Medication Dose Route Frequency Provider Last Rate Last Dose  . Influenza (>/= 3 years) inactive virus vaccine (FLVIRIN/FLUZONE) injection SUSP 0.5 mL  0.5 mL Intramuscular Once Fayrene Helper, MD        Review of Systems Review of Systems Ankle , Stiff joints, swollen joints, dizziness. Denies fever chills fatigue skin rash. Blood pressure 109/61, height 5\' 5"  (1.651 m), weight 294 lb 6.4 oz (133.539 kg), last menstrual  period 03/27/2011.  Physical Exam Physical Exam Vitals as recorded stable appearance normal oriented 3 mood normal affect normal. Tenderness over the radial side of the right wrist positive Finkelstein's test painful ulnar deviation normal wrist flexion extension. Watson test negative. Grip strength normal. Skin intact pulses normal sensation normal and normal epitrochlear lymph nodes Data Reviewed X-ray obtained by interpretation is that the wrist x-ray is normal  Assessment    Encounter Diagnoses  Name Primary?  . Right wrist pain   . De Quervain's syndrome (tenosynovitis) Yes        Plan     rhino splints for 6 weeks   continue anti-inflammatory nabumetone Ad [employer] preparation #1; 3 times a day  Follow-up 6 weeks  Arther Abbott 01/09/2014, 3:36 PM

## 2014-01-30 ENCOUNTER — Ambulatory Visit (HOSPITAL_COMMUNITY): Payer: Self-pay | Admitting: Psychiatry

## 2014-02-20 ENCOUNTER — Ambulatory Visit: Payer: 59 | Admitting: Orthopedic Surgery

## 2014-03-15 ENCOUNTER — Ambulatory Visit (INDEPENDENT_AMBULATORY_CARE_PROVIDER_SITE_OTHER): Payer: 59 | Admitting: Orthopedic Surgery

## 2014-03-15 ENCOUNTER — Encounter: Payer: Self-pay | Admitting: Orthopedic Surgery

## 2014-03-15 VITALS — BP 119/65 | Ht 65.0 in | Wt 294.4 lb

## 2014-03-15 DIAGNOSIS — M654 Radial styloid tenosynovitis [de Quervain]: Secondary | ICD-10-CM

## 2014-03-15 NOTE — Progress Notes (Signed)
Chief Complaint  Patient presents with  . Follow-up    follow up Right DEQ s/p brace    BP 119/65 mmHg  Ht 5\' 5"  (1.651 m)  Wt 294 lb 6.4 oz (133.539 kg)  BMI 48.99 kg/m2  LMP 03/27/2011  Encounter Diagnosis  Name Primary?  Tennis Must Quervain's syndrome (tenosynovitis) Yes    This patient has been treated with bracing for de Quervain's syndrome she says she is a little bit better but can't be fully compliant with brace wear secondary to activities of daily living  Review of systems no numbness tingling or new symptoms  She has tenderness still over the first extensor compartment and a positive Finkelstein's test. Neurologic exam is intact good capillary refill is noted she is awake alert and oriented 3 mood and affect are normal vital signs are stable  We recommended a injection and she tolerated that well she is back and her brace follow-up 3 weeks if no improvement we will recommend surgical release.  Procedure note  Injection  Verbal consent was obtained to inject the  right first extensor compartment  Timeout procedure was completed to confirm injection site  Diagnosis de Quervain's syndrome right wrist  Medications used Depo-Medrol 40 mg 1 cc Lidocaine 1% plain 3 cc  Anesthesia was provided by ethyl chloride spray  Prep was performed with alcohol  Technique of injection  25-gauge needle injected medication as noted   No complications were noted

## 2014-03-20 ENCOUNTER — Telehealth: Payer: Self-pay | Admitting: *Deleted

## 2014-03-20 ENCOUNTER — Ambulatory Visit: Payer: BC Managed Care – PPO | Admitting: Family Medicine

## 2014-03-20 ENCOUNTER — Other Ambulatory Visit: Payer: Self-pay | Admitting: Orthopedic Surgery

## 2014-03-20 NOTE — Telephone Encounter (Signed)
Spoke with patient and rescheduled patient.

## 2014-03-20 NOTE — Telephone Encounter (Signed)
Pt called and LMOM for the nurse to call her back. Please advise

## 2014-03-27 ENCOUNTER — Other Ambulatory Visit: Payer: Self-pay | Admitting: *Deleted

## 2014-03-27 MED ORDER — NABUMETONE 500 MG PO TABS: 500.0000 mg | ORAL_TABLET | Freq: Every day | ORAL | Status: DC

## 2014-04-04 ENCOUNTER — Ambulatory Visit: Payer: Self-pay | Admitting: Gastroenterology

## 2014-04-05 ENCOUNTER — Ambulatory Visit: Payer: 59 | Admitting: Orthopedic Surgery

## 2014-04-05 ENCOUNTER — Encounter: Payer: Self-pay | Admitting: Orthopedic Surgery

## 2014-04-12 ENCOUNTER — Ambulatory Visit (HOSPITAL_COMMUNITY): Payer: 59 | Admitting: Hematology & Oncology

## 2014-04-12 ENCOUNTER — Other Ambulatory Visit (HOSPITAL_COMMUNITY): Payer: BC Managed Care – PPO

## 2014-04-17 ENCOUNTER — Other Ambulatory Visit: Payer: Self-pay | Admitting: Family Medicine

## 2014-04-24 ENCOUNTER — Encounter: Payer: Self-pay | Admitting: Orthopedic Surgery

## 2014-04-24 ENCOUNTER — Ambulatory Visit (INDEPENDENT_AMBULATORY_CARE_PROVIDER_SITE_OTHER): Payer: 59 | Admitting: Orthopedic Surgery

## 2014-04-24 VITALS — BP 131/70 | Ht 65.0 in | Wt 294.4 lb

## 2014-04-24 DIAGNOSIS — M654 Radial styloid tenosynovitis [de Quervain]: Secondary | ICD-10-CM

## 2014-04-24 NOTE — Progress Notes (Signed)
Follow-up visit  History de Quervain's syndrome treated with bracing and injection  Patient has no symptoms at this time  Clinical exam shows pain less ulnar deviation  Recommend follow-up as needed

## 2014-04-25 ENCOUNTER — Other Ambulatory Visit (HOSPITAL_COMMUNITY): Payer: Self-pay

## 2014-04-25 DIAGNOSIS — C50919 Malignant neoplasm of unspecified site of unspecified female breast: Secondary | ICD-10-CM

## 2014-05-01 ENCOUNTER — Other Ambulatory Visit (HOSPITAL_COMMUNITY): Payer: Self-pay | Admitting: Hematology & Oncology

## 2014-05-01 ENCOUNTER — Ambulatory Visit (HOSPITAL_COMMUNITY)
Admission: RE | Admit: 2014-05-01 | Discharge: 2014-05-01 | Disposition: A | Discharge status: Home / Self Care | Payer: 59 | Source: Ambulatory Visit | Attending: Hematology & Oncology | Admitting: Hematology & Oncology

## 2014-05-01 ENCOUNTER — Encounter (HOSPITAL_BASED_OUTPATIENT_CLINIC_OR_DEPARTMENT_OTHER): Payer: 59

## 2014-05-01 ENCOUNTER — Encounter (HOSPITAL_COMMUNITY): Payer: Self-pay | Admitting: Hematology & Oncology

## 2014-05-01 ENCOUNTER — Encounter (HOSPITAL_COMMUNITY): Payer: 59 | Attending: Hematology & Oncology | Admitting: Hematology & Oncology

## 2014-05-01 VITALS — BP 117/77 | HR 70 | Temp 98.1°F | Resp 18 | Wt 293.5 lb

## 2014-05-01 DIAGNOSIS — Z171 Estrogen receptor negative status [ER-]: Secondary | ICD-10-CM

## 2014-05-01 DIAGNOSIS — Z9012 Acquired absence of left breast and nipple: Secondary | ICD-10-CM | POA: Diagnosis not present

## 2014-05-01 DIAGNOSIS — Z1501 Genetic susceptibility to malignant neoplasm of breast: Secondary | ICD-10-CM

## 2014-05-01 DIAGNOSIS — Z1509 Genetic susceptibility to other malignant neoplasm: Secondary | ICD-10-CM

## 2014-05-01 DIAGNOSIS — C50912 Malignant neoplasm of unspecified site of left female breast: Secondary | ICD-10-CM | POA: Diagnosis not present

## 2014-05-01 DIAGNOSIS — C50911 Malignant neoplasm of unspecified site of right female breast: Secondary | ICD-10-CM | POA: Diagnosis not present

## 2014-05-01 DIAGNOSIS — M79602 Pain in left arm: Secondary | ICD-10-CM

## 2014-05-01 DIAGNOSIS — M79632 Pain in left forearm: Secondary | ICD-10-CM | POA: Diagnosis not present

## 2014-05-01 DIAGNOSIS — Z853 Personal history of malignant neoplasm of breast: Secondary | ICD-10-CM

## 2014-05-01 DIAGNOSIS — C50919 Malignant neoplasm of unspecified site of unspecified female breast: Secondary | ICD-10-CM

## 2014-05-01 LAB — CBC WITH DIFFERENTIAL/PLATELET
Basophils Absolute: 0 10*3/uL (ref 0.0–0.1)
Basophils Relative: 1 % (ref 0–1)
Eosinophils Absolute: 0.1 10*3/uL (ref 0.0–0.7)
Eosinophils Relative: 2 % (ref 0–5)
HEMATOCRIT: 36.4 % (ref 36.0–46.0)
Hemoglobin: 12.3 g/dL (ref 12.0–15.0)
LYMPHS PCT: 40 % (ref 12–46)
Lymphs Abs: 1.6 10*3/uL (ref 0.7–4.0)
MCH: 31.1 pg (ref 26.0–34.0)
MCHC: 33.8 g/dL (ref 30.0–36.0)
MCV: 92.2 fL (ref 78.0–100.0)
MONO ABS: 0.3 10*3/uL (ref 0.1–1.0)
MONOS PCT: 8 % (ref 3–12)
NEUTROS PCT: 49 % (ref 43–77)
Neutro Abs: 2 10*3/uL (ref 1.7–7.7)
Platelets: 190 10*3/uL (ref 150–400)
RBC: 3.95 MIL/uL (ref 3.87–5.11)
RDW: 14.8 % (ref 11.5–15.5)
WBC: 4.1 10*3/uL (ref 4.0–10.5)

## 2014-05-01 LAB — COMPREHENSIVE METABOLIC PANEL
ALBUMIN: 4.1 g/dL (ref 3.5–5.2)
ALK PHOS: 60 U/L (ref 39–117)
ALT: 37 U/L — AB (ref 0–35)
AST: 26 U/L (ref 0–37)
Anion gap: 8 (ref 5–15)
BUN: 16 mg/dL (ref 6–23)
CO2: 24 mmol/L (ref 19–32)
Calcium: 8.9 mg/dL (ref 8.4–10.5)
Chloride: 107 mmol/L (ref 96–112)
Creatinine, Ser: 0.65 mg/dL (ref 0.50–1.10)
GFR calc Af Amer: >90 mL/min (ref >90)
GFR calc non Af Amer: >90 mL/min (ref >90)
Glucose, Bld: 94 mg/dL (ref 70–99)
Potassium: 4 mmol/L (ref 3.5–5.1)
SODIUM: 139 mmol/L (ref 135–145)
Total Bilirubin: 0.4 mg/dL (ref 0.3–1.2)
Total Protein: 7.2 g/dL (ref 6.0–8.3)

## 2014-05-01 NOTE — Progress Notes (Signed)
LABS DRAWN

## 2014-05-01 NOTE — Progress Notes (Signed)
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 Fanny Skates on 02/13/2011 followed by sentinel node biopsy which was negative. Her Ki-67 marker was high at 54% HER-2/neu was nonamplified and she was ER/PR negative. She is now status post 6 cycles of carboplatin and Taxotere and S/P radiation. No evidence of recurrence.  #2.Left-sided breast cancer stage II grade 3 triple negative at that time as well with 7 negative nodes but a 2.3 cm primary with mastectomy on 11/27/1997. She was treated with AC. x4 cycles followed by Taxol x4 cycles thus far without recurrence.  #3. BRCA1 positivity.   CURRENT THERAPY:Observation and surveillance   INTERVAL HISTORY: Kathleen Reynolds 54 y.o. 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 Fanny Skates on 02/13/2011 followed by sentinel node biopsy which was negative. Her Ki-67 marker was high at 54% HER-2/neu was nonamplified and she was ER/PR negative. She is now status post 6 cycles of carboplatin and Taxotere (04/03/11- 07/16/11) and S/P radiation finishing in August 2013. AND History of left-sided breast cancer stage II grade 3 triple negative  with 7 negative nodes but a 2.3 cm primary with mastectomy on 11/27/1997. She was treated with AC. x4 cycles followed by Taxol x4 cycles thus far without recurrence.  She has discussed with Dr. Garwin Brothers (Gynecologist) an oophorectomy due to her BRCA 1 mutation but states that has not been pursued. She notes that she was encouraged to loose weight but has not been able to. He has tried Weight Watchers and states it was too expensive. She wants  to lose weight on her own. She finds increasing her activity level is difficult because of her work schedule.  She never had formal genetic counseling she states, she does understand she has an increased risk of breast cancer and ovarian cancer but is unfamiliar with the numbers. She has intermittently thought about a right mastectomy and reconstruction. She thinks her risk of getting breast cancer is higher if she has reconstruction. She has a lot of questions about her options moving forward. She states the MRI exam of her breasts is very expensive.  She notes left arm pain to the point she cannot raise her arm. She hurt her left shoulder several years ago but describes this pain as in the middle of her upper arm. It is consistent and not relieved by over-the-counter medications.    Past Medical History  Diagnosis Date  . Migraines   . Breast mass in female     right  . Arthritis   . Hyperlipidemia   . Breast cancer     Right  . Left rotator cuff tear   . Morbid obesity   . Anemia LIFELONG    HEAVY MENSES MAR 2012 HB 13 MCV 86.7 FERRITIN  66    has Malignant neoplasm of breast (female); Thyroid nodule; DYSLIPIDEMIA; HIP PAIN, RIGHT; RUPTURE ROTATOR CUFF; FATIGUE; HEADACHE; INGUINAL PAIN, RIGHT; Screening for colon cancer; History of iron deficiency anemia; Prediabetes; Colonic adenoma; Invasive ductal carcinoma of right breast, stage 1; BRCA1 positive; Knee pain, left; OA (osteoarthritis) of knee; Anemia; Obesity, morbid, BMI 40.0-49.9; Metabolic syndrome X; Achilles  tendonitis, bilateral; Bilateral foot pain; Pain in joint, ankle and foot; Stiffness of joint, not elsewhere classified, ankle and foot; Shoulder pain, right; Muscle weakness (generalized); Rotator cuff syndrome; Depression with anxiety; Hand pain, right; Right forearm pain; and Need for prophylactic vaccination and inoculation against influenza on her problem list.     is allergic to ibuprofen.  Ms. Popwell had no  medications administered during this visit.  Past Surgical History  Procedure Laterality Date  . Dilation and curettage of uterus    . Colonoscopy  2004 DR. SMITH    No polyp, hemorrhoids  . Colonoscopy  06/09/10    [id number] adenomas   . Hystersonogram  2009  . Dilation and curettage of uterus  05/2002  . Mastectomy  1999    left breast cancer   . Breast surgery  11/1997    Left breast cancer-mastectomy  . Breast biopsy  02/13/2011    Procedure: BREAST BIOPSY WITH NEEDLE LOCALIZATION;  Surgeon: Adin Hector, MD;  Location: [place];  Service: General;  Laterality: Right;  . Breast surgery  2/13-    rt axilly bx  . Portacath placement  03/27/2011    Procedure: INSERTION PORT-A-CATH;  Surgeon: Adin Hector, MD;  Location: [place];  Service: General;  Laterality: Left;  insert port a cath  . Port-a-cath removal  10/20/2011    Procedure: MINOR REMOVAL PORT-A-CATH;  Surgeon: Adin Hector, MD;  Location: [place];  Service: General;  Laterality: N/A;  Porta-cath removal  left    Denies any headaches, dizziness, double vision, fevers, chills, night sweats, nausea, vomiting, diarrhea, constipation, chest pain, heart palpitations, shortness of breath, blood in stool, black tarry stool, urinary pain, urinary burning, urinary frequency, hematuria.   PHYSICAL EXAMINATION  ECOG PERFORMANCE STATUS: 0 - Asymptomatic  There were no vitals filed for this visit.  GENERAL:alert, no distress, well nourished, well developed, comfortable, cooperative, morbidly obese and smiling  SKIN: skin color, texture, turgor are normal, no rashes or significant lesions  HEAD: Normocephalic, No masses, lesions, tenderness or abnormalities  EYES: normal, Conjunctiva are pink and non-injected  EARS: External ears normal  OROPHARYNX:lips, buccal mucosa, and tongue normal and mucous membranes are moist  NECK: supple, no adenopathy, thyroid normal size,  non-tender, without nodularity, no stridor, non-tender, trachea midline  LYMPH: no palpable lymphadenopathy, no hepatosplenomegaly  BREAST:right breast normal without mass, skin or nipple changes or axillary nodes, hyperpigmentation and erythema secondary to radiation with a small fibrotic area in the 7-8 o'clock position. Questionable area of abnormality at the 8 oclock position. (patient is large breasted)  Left post-mastectomy site well healed and free of suspicious changes. LUNGS: clear to auscultation and percussion  HEART: regular rate & rhythm, no murmurs, no gallops, S1 normal and S2 normal  ABDOMEN:abdomen soft, non-tender, morbidly obese and normal bowel sounds  BACK: Back symmetric, no curvature., No CVA tenderness  EXTREMITIES:less then 2 second capillary refill, no joint deformities, effusion, or inflammation, no edema, no skin discoloration, no clubbing, no cyanosis  NEURO: alert & oriented x 3 with fluent speech, no focal motor/sensory deficits, gait normal   LABORATORY DATA: CBC    Component Value Date/Time   WBC 4.0 12/18/2013 0741   RBC 4.39 12/18/2013 0741   HGB 13.5 12/18/2013 0741   HCT 40.6 12/18/2013 0741   PLT 226 12/18/2013 0741   MCV 92.5 12/18/2013 0741   MCH 30.8 12/18/2013 0741   MCHC 33.3 12/18/2013 0741   RDW  15.5 12/18/2013 0741   LYMPHSABS 1.3 12/18/2013 0741   MONOABS 0.2 12/18/2013 0741   EOSABS 0.0 12/18/2013 0741   BASOSABS 0.0 12/18/2013 0741      Chemistry      Component Value Date/Time   NA 140 12/18/2013 0741   K 4.1 12/18/2013 0741   CL 106 12/18/2013 0741   CO2 20 12/18/2013 0741   BUN 17 12/18/2013 0741   CREATININE 0.75 12/18/2013 0741   CREATININE 0.66 10/10/2013 1033      Component Value Date/Time   CALCIUM 9.8 12/18/2013 0741   ALKPHOS 71 12/18/2013 0741   AST 17 12/18/2013 0741   ALT 22 12/18/2013 0741   BILITOT 0.3 12/18/2013 0741        ASSESSMENT:  1. 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 Fanny Skates on 02/13/2011 followed by sentinel node biopsy which was negative. Her Ki-67 marker was high at 54% HER-2/neu was nonamplified and she was ER/PR negative. She is now status post 6 cycles of carboplatin and Taxotere and S/P radiation. No evidence of recurrence.  2. BRCA1 positivity.  3. History of left-sided breast cancer stage II grade 3 triple negative at that time as well with 7 negative nodes but a 2.3 cm primary with mastectomy on 11/27/1997. She was treated with AC. x4 cycles followed by Taxol x4 cycles thus far without recurrence.   PLAN:   I spent a significant portion of her visit today discussing BRCA 1 mutation and the risk of breast and ovarian cancer. I put this in perspective for her as she stated she did not have a good understanding of her risk. We also talked about options including ongoing mammography and breast MRI versus mastectomy with reconstruction. We discussed the risks and benefits associated with both.  We discussed different options for weight loss and again she is adamant she will do this on her own. I would like for her to see her gynecologist again for consideration of oophorectomy.  I have ordered plain films of the left upper extremity if normal I advised her to call if her symptoms do not improve. We will keep her apprised of the results when they become available.  I would like to see her back in 3 months to discuss all of the above. In the interim she states she will go see her gynecologist. If she has problems or concerns prior to follow-up she is to let us know. Additional recommendations regarding her arm will be made once her plain films are available.  THERAPY PLAN:  I will follow the NCCN guidelines recommendations for increased risk breast cancer patient due to her BRCA 1 positivity and history of 2 breast cancers at the age of 3.  Will perform yearly mammogram and MRI breast alternating so a screening  test will be performed every 6 months.   All questions were answered. The patient knows to call the clinic with any problems, questions or concerns. We can certainly see the patient much sooner if necessary.   This note was signed electronically  Molli Hazard MD

## 2014-05-01 NOTE — Patient Instructions (Addendum)
..  [place] at [facility] Discharge Instructions  RECOMMENDATIONS MADE BY THE CONSULTANT AND ANY TEST RESULTS WILL BE SENT TO YOUR REFERRING PHYSICIAN.   Disease discussed per Dr. Shari Prows today and we will try to get you into have the breast MRI sooner.   Thank you for choosing [place] at [facility] to provide your oncology and hematology care.  To afford each patient quality time with our provider, please arrive at least 15 minutes before your scheduled appointment time.    You need to re-schedule your appointment should you arrive 10 or more minutes late.  We strive to give you quality time with our providers, and arriving late affects you and other patients whose appointments are after yours.  Also, if you no show three or more times for appointments you may be dismissed from the clinic at the providers discretion.     Again, thank you for choosing [facility].  Our hope is that these requests will decrease the amount of time that you wait before being seen by our physicians.       _____________________________________________________________  Should you have questions after your visit to [facility], please contact our office at [phone] between the hours of 8:30 a.m. and 4:30 p.m.  Voicemails left after 4:30 p.m. will not be returned until the following business day.  For prescription refill requests, have your pharmacy contact our office.

## 2014-05-02 ENCOUNTER — Other Ambulatory Visit: Payer: Self-pay

## 2014-05-02 ENCOUNTER — Ambulatory Visit (INDEPENDENT_AMBULATORY_CARE_PROVIDER_SITE_OTHER): Payer: 59 | Admitting: Family Medicine

## 2014-05-02 ENCOUNTER — Encounter: Payer: Self-pay | Admitting: Family Medicine

## 2014-05-02 VITALS — BP 148/82 | HR 80 | Resp 18 | Ht 65.0 in | Wt 295.0 lb

## 2014-05-02 DIAGNOSIS — R7303 Prediabetes: Secondary | ICD-10-CM

## 2014-05-02 DIAGNOSIS — G44019 Episodic cluster headache, not intractable: Secondary | ICD-10-CM | POA: Diagnosis not present

## 2014-05-02 DIAGNOSIS — R7309 Other abnormal glucose: Secondary | ICD-10-CM

## 2014-05-02 DIAGNOSIS — E785 Hyperlipidemia, unspecified: Secondary | ICD-10-CM

## 2014-05-02 DIAGNOSIS — E8881 Metabolic syndrome: Secondary | ICD-10-CM | POA: Diagnosis not present

## 2014-05-02 DIAGNOSIS — R42 Dizziness and giddiness: Secondary | ICD-10-CM

## 2014-05-02 LAB — CANCER ANTIGEN 27.29: CA 27.29: 13.9 U/mL (ref 0.0–38.6)

## 2014-05-02 MED ORDER — BUTALBITAL-APAP-CAFFEINE 50-325-40 MG PO TABS: 1.0000 | ORAL_TABLET | Freq: Two times a day (BID) | ORAL | Status: DC | PRN

## 2014-05-02 MED ORDER — METFORMIN HCL 500 MG PO TABS: 500.0000 mg | ORAL_TABLET | Freq: Two times a day (BID) | ORAL | Status: DC

## 2014-05-02 MED ORDER — MECLIZINE HCL 25 MG PO TABS: 25.0000 mg | ORAL_TABLET | Freq: Three times a day (TID) | ORAL | Status: DC | PRN

## 2014-05-02 NOTE — Patient Instructions (Addendum)
F/u in 3 months  Weight management as per your direction  Weight loss goal of 5 pounds per month, and commit to exercise 5 days per week for 30 mins each time  A lot to think about re breast and ovaries   hBa1C , fasting lipid  Left upper arm pain physical therapy that you already know , and if not successful , ortho to eval  Blood pressure elevated today it is vital that you work on changing lifestyle and weight lsos'  All the best, you can do this!

## 2014-05-09 ENCOUNTER — Ambulatory Visit (INDEPENDENT_AMBULATORY_CARE_PROVIDER_SITE_OTHER): Payer: 59 | Admitting: Gastroenterology

## 2014-05-09 ENCOUNTER — Encounter: Payer: Self-pay | Admitting: Gastroenterology

## 2014-05-09 VITALS — BP 143/74 | HR 80 | Temp 97.8°F | Ht 64.0 in | Wt 293.2 lb

## 2014-05-09 DIAGNOSIS — R740 Nonspecific elevation of levels of transaminase and lactic acid dehydrogenase [LDH]: Secondary | ICD-10-CM

## 2014-05-09 DIAGNOSIS — R7401 Elevation of levels of liver transaminase levels: Secondary | ICD-10-CM

## 2014-05-09 DIAGNOSIS — D500 Iron deficiency anemia secondary to blood loss (chronic): Secondary | ICD-10-CM | POA: Diagnosis not present

## 2014-05-09 DIAGNOSIS — R7402 Elevation of levels of lactic acid dehydrogenase (LDH): Secondary | ICD-10-CM | POA: Insufficient documentation

## 2014-05-09 NOTE — Progress Notes (Signed)
cc'ed to pcp °

## 2014-05-09 NOTE — Assessment & Plan Note (Signed)
MOST LIKELY DUE TO NASH.  CONTINUE YOUR WEIGHT LOSS EFFORTS. RECHECK HFP IN 3 MOS OPV IN 6 MOS

## 2014-05-09 NOTE — Patient Instructions (Addendum)
CONTINUE YOUR WEIGHT LOSS EFFORTS.  YOUR BLOOD COUNT WAS NORMAL IN MAR 2016(HEMOGLOBIN 12.3)  RECHECK YOUR LIVER TESTS IN 3 MOS.  FOLLOW UP IN 6 MOS.

## 2014-05-09 NOTE — Progress Notes (Signed)
Subjective:    Patient ID: Kathleen Reynolds, female    DOB: 05-23-1960, 54 y.o.   MRN: [id number]  Kathleen Nakayama, MD  HPI No questions or concerns. OCCASIONAL WINE. SOB WHEN SHE MOVES AROUND SOMETIMES-UP AND DOWN.  PT DENIES FEVER, CHILLS, HEMATOCHEZIA, HEMATEMESIS, nausea, vomiting, melena, diarrhea, CHEST PAIN, SHORTNESS OF BREATH,  CHANGE IN BOWEL IN HABITS, constipation, abdominal pain, problems swallowing,OR heartburn or indigestion.   Past Medical History  Diagnosis Date  . Migraines   . Breast mass in female     right  . Arthritis   . Hyperlipidemia   . Breast cancer     Right  . Left rotator cuff tear   . Morbid obesity   . Anemia LIFELONG    HEAVY MENSES MAR 2012 HB 13 MCV 86.7 FERRITIN  66    Past Surgical History  Procedure Laterality Date  . Dilation and curettage of uterus    . Colonoscopy  2004 DR. SMITH    No polyp, hemorrhoids  . Colonoscopy  06/09/10    [id number] adenomas   . Hystersonogram  2009  . Dilation and curettage of uterus  05/2002  . Mastectomy  1999    left breast cancer   . Breast surgery  11/1997    Left breast cancer-mastectomy  . Breast biopsy  02/13/2011    Procedure: BREAST BIOPSY WITH NEEDLE LOCALIZATION;  Surgeon: Adin Hector, MD;  Location: [place];  Service: General;  Laterality: Right;  . Breast surgery  2/13-    rt axilly bx  . Portacath placement  03/27/2011    Procedure: INSERTION PORT-A-CATH;  Surgeon: Adin Hector, MD;  Location: [place];  Service: General;  Laterality: Left;  insert port a cath  . Port-a-cath removal  10/20/2011    Procedure: MINOR REMOVAL PORT-A-CATH;  Surgeon: Adin Hector, MD;  Location: [place];  Service: General;  Laterality: N/A;  Porta-cath removal  left    Allergies  Allergen Reactions  . Ibuprofen Itching    Current Outpatient Prescriptions  Medication Sig Dispense Refill  . butalbital-acetaminophen-caffeine (FIORICET,  ESGIC) 50-325-40 MG per tablet Take 1 tablet by mouth 2 (two) times daily as needed. For migraine    . calcium-vitamin D (OSCAL WITH D) 500-200 MG-UNIT per tablet Take 1 tablet by mouth.    . Garlic 893 MG TABS Take 1 tablet by mouth daily.    . meclizine (ANTIVERT) 25 MG tablet Take 1 tablet (25 mg total) by mouth 3 (three) times daily as needed. For dizziness    . metFORMIN (GLUCOPHAGE) 500 MG tablet Take 1 tablet (500 mg total) by mouth 2 (two) times daily with a meal.    . Multiple Vitamin (MULITIVITAMIN WITH MINERALS) TABS Take 1 tablet by mouth daily.    . Flaxseed, Linseed, (FLAXSEED OIL) 1000 MG CAPS Take 2,000 mg by mouth daily.      Review of Systems MAR 2016 ALT 37    Objective:   Physical Exam  Constitutional: She is oriented to person, place, and time. She appears well-developed and well-nourished. No distress.  HENT:  Head: Normocephalic and atraumatic.  Mouth/Throat: Oropharynx is clear and moist. No oropharyngeal exudate.  Eyes: Pupils are equal, round, and reactive to light. No scleral icterus.  Neck: Normal range of motion. Neck supple.  Cardiovascular: Normal rate, regular rhythm and normal heart sounds.   Pulmonary/Chest: Effort normal and breath sounds normal. No respiratory distress.  Abdominal: Soft. Bowel sounds are  normal. She exhibits no distension. There is no tenderness.  Musculoskeletal: She exhibits no edema.  Lymphadenopathy:    She has no cervical adenopathy.  Neurological: She is alert and oriented to person, place, and time.  NO FOCAL DEFICITS   Psychiatric: She has a normal mood and affect.  Vitals reviewed.         Assessment & Plan:

## 2014-05-09 NOTE — Progress Notes (Signed)
ON RECALL  °

## 2014-05-09 NOTE — Assessment & Plan Note (Addendum)
Hb STABLE. NO BRBPR OR MELENA. MOST LIKELY DUE TO HEAVY MENSES AND NOW GOING THROUGH MENOPAUSE.  CONTINUE TO MONITOR CBC. FOLLOW UP IN 6 MOS.

## 2014-05-20 DIAGNOSIS — R42 Dizziness and giddiness: Secondary | ICD-10-CM | POA: Insufficient documentation

## 2014-05-20 NOTE — Assessment & Plan Note (Signed)
Unchnaged. Patient re-educated about  the importance of commitment to a  minimum of 150 minutes of exercise per week.  The importance of healthy food choices with portion control discussed. Encouraged to start a food diary, count calories and to consider  joining a support group. Sample diet sheets offered. Goals set by the patient for the next several months.   Weight /BMI 05/09/2014 05/02/2014 05/01/2014  WEIGHT 293 lb 3.2 oz 295 lb 293 lb 8 oz  HEIGHT 5\' 4"  5\' 5"  -  BMI 50.3 kg/m2 49.09 kg/m2 48.84 kg/m2    Current exercise per week 30 minutes.

## 2014-05-20 NOTE — Assessment & Plan Note (Signed)
The increased risk of cardiovascular disease associated with this diagnosis, and the need to consistently work on lifestyle to change this is discussed. Following  a  heart healthy diet ,commitment to 30 minutes of exercise at least 5 days per week, as well as control of blood sugar and cholesterol , and achieving a healthy weight are all the areas to be addressed .  

## 2014-05-20 NOTE — Assessment & Plan Note (Signed)
Denies any acute episodes in the past 4 months, script for antivert sent in , for as needed use

## 2014-05-20 NOTE — Assessment & Plan Note (Signed)
Controlled, no change in medication On avg pt experiencing 1 headache per month

## 2014-05-20 NOTE — Assessment & Plan Note (Addendum)
  Updated lab needed at/ before next visit.  Patient educated about the importance of limiting  Carbohydrate intake , the need to commit to daily physical activity for a minimum of 30 minutes , and to commit weight loss. The fact that changes in all these areas will reduce or eliminate all together the development of diabetes is stressed.   Diabetic Labs Latest Ref Rng 05/01/2014 12/18/2013 10/10/2013 06/05/2013 03/28/2013  HbA1c <5.7 % - 5.9(H) - 6.0(H) -  Chol 0 - 200 mg/dL - 201(H) - - -  HDL >39 mg/dL - 57 - - -  Calc LDL 0 - 99 mg/dL - 116(H) - - -  Triglycerides <150 mg/dL - 138 - - -  Creatinine 0.50 - 1.10 mg/dL 0.65 0.75 0.66 0.74 0.72   BP/Weight 05/09/2014 05/02/2014 05/01/2014 04/24/2014 03/15/2014 01/09/2014 47/10/2955  Systolic BP 473 403 709 643 838 184 037  Diastolic BP 74 82 77 70 65 61 72  Wt. (Lbs) 293.2 295 293.5 294.4 294.4 294.4 294.4  BMI 50.3 49.09 48.84 48.99 48.99 48.99 48.99   No flowsheet data found.

## 2014-05-20 NOTE — Progress Notes (Signed)
Subjective:    Patient ID: Kathleen Reynolds, female    DOB: Jun 17, 1960, 54 y.o.   MRN: [id number]  HPI The PT is here for follow up and re-evaluation of chronic medical conditions, medication management and review of any available recent lab and radiology data.  Preventive health is updated, specifically  Cancer screening and Immunization.   Questions or concerns regarding consultations or procedures which the PT has had in the interim are  Addressed.Contemplaitng her informed decision re upcoming gyne surgery No interest in bariatric surgery, dietary counseling, no weight loss,  But states this will change by next visit The PT denies any adverse reactions to current medications since the last visit.  Headaches fairly well controlled and infrequent, also requsts med to have on hand in the event of recurrent vertigo, no episode in past 3 months     Review of Systems See HPI Denies recent fever or chills. Denies sinus pressure, nasal congestion, ear pain or sore throat. Denies chest congestion, productive cough or wheezing. Denies chest pains, palpitations and leg swelling Denies abdominal pain, nausea, vomiting,diarrhea or constipation.   Denies dysuria, frequency, hesitancy or incontinence. Denies joint pain, swelling and limitation in mobility.c/o left outer upper arm discomfort for weeks, no trauma Denies headaches, seizures, numbness, or tingling. Denies depression, anxiety or insomnia. Denies skin break down or rash.        Objective:   Physical Exam BP 148/82 mmHg  Pulse 80  Resp 18  Ht 5\' 5"  (1.651 m)  Wt 295 lb (133.811 kg)  BMI 49.09 kg/m2  SpO2 96%  LMP 03/27/2011  Patient alert and oriented and in no cardiopulmonary distress.  HEENT: No facial asymmetry, EOMI,   oropharynx pink and moist.  Neck supple no JVD, no mass.  Chest: Clear to auscultation bilaterally.  CVS: S1, S2 no murmurs, no S3.Regular rate.  ABD: Soft non tender.   Ext: No edema  MS:  Adequate ROM spine, shoulders, hips and knees.No localized warmth or swelling over area of concern in left upper arm  Skin: Intact, no ulcerations or rash noted.  Psych: Good eye contact, normal affect. Memory intact not anxious or depressed appearing.  CNS: CN 2-12 intact, power,  normal throughout.no focal deficits noted.       Assessment & Plan:  Prediabetes  Updated lab needed at/ before next visit.  Patient educated about the importance of limiting  Carbohydrate intake , the need to commit to daily physical activity for a minimum of 30 minutes , and to commit weight loss. The fact that changes in all these areas will reduce or eliminate all together the development of diabetes is stressed.   Diabetic Labs Latest Ref Rng 05/01/2014 12/18/2013 10/10/2013 06/05/2013 03/28/2013  HbA1c <5.7 % - 5.9(H) - 6.0(H) -  Chol 0 - 200 mg/dL - 201(H) - - -  HDL >39 mg/dL - 57 - - -  Calc LDL 0 - 99 mg/dL - 116(H) - - -  Triglycerides <150 mg/dL - 138 - - -  Creatinine 0.50 - 1.10 mg/dL 0.65 0.75 0.66 0.74 0.72   BP/Weight 05/09/2014 05/02/2014 05/01/2014 04/24/2014 03/15/2014 01/09/2014 35/57/3220  Systolic BP 254 270 623 762 831 517 616  Diastolic BP 74 82 77 70 65 61 72  Wt. (Lbs) 293.2 295 293.5 294.4 294.4 294.4 294.4  BMI 50.3 49.09 48.84 48.99 48.99 48.99 48.99   No flowsheet data found.      Metabolic syndrome X The increased risk of cardiovascular disease associated with this  diagnosis, and the need to consistently work on lifestyle to change this is discussed. Following  a  heart healthy diet ,commitment to 30 minutes of exercise at least 5 days per week, as well as control of blood sugar and cholesterol , and achieving a healthy weight are all the areas to be addressed .    Obesity, morbid, BMI 40.0-49.9 Unchnaged. Patient re-educated about  the importance of commitment to a  minimum of 150 minutes of exercise per week.  The importance of healthy food choices with portion control  discussed. Encouraged to start a food diary, count calories and to consider  joining a support group. Sample diet sheets offered. Goals set by the patient for the next several months.   Weight /BMI 05/09/2014 05/02/2014 05/01/2014  WEIGHT 293 lb 3.2 oz 295 lb 293 lb 8 oz  HEIGHT 5\' 4"  5\' 5"  -  BMI 50.3 kg/m2 49.09 kg/m2 48.84 kg/m2    Current exercise per week 30 minutes.    Headache Controlled, no change in medication On avg pt experiencing 1 headache per month   Vertigo, intermittent Denies any acute episodes in the past 4 months, script for antivert sent in , for as needed use

## 2014-05-21 ENCOUNTER — Ambulatory Visit
Admission: RE | Admit: 2014-05-21 | Discharge: 2014-05-21 | Disposition: A | Discharge status: Home / Self Care | Payer: 59 | Source: Ambulatory Visit | Attending: Oncology | Admitting: Oncology

## 2014-05-21 DIAGNOSIS — Z1509 Genetic susceptibility to other malignant neoplasm: Principal | ICD-10-CM

## 2014-05-21 DIAGNOSIS — C50912 Malignant neoplasm of unspecified site of left female breast: Secondary | ICD-10-CM

## 2014-05-21 DIAGNOSIS — Z1501 Genetic susceptibility to malignant neoplasm of breast: Secondary | ICD-10-CM

## 2014-05-21 MED ORDER — GADOBENATE DIMEGLUMINE 529 MG/ML IV SOLN
20.0000 mL | Freq: Once | INTRAVENOUS | Status: AC | PRN
  Administered 2014-05-21: 20 mL via INTRAVENOUS

## 2014-05-22 ENCOUNTER — Other Ambulatory Visit (HOSPITAL_COMMUNITY): Payer: Self-pay | Admitting: Oncology

## 2014-05-22 DIAGNOSIS — C50912 Malignant neoplasm of unspecified site of left female breast: Secondary | ICD-10-CM

## 2014-05-22 DIAGNOSIS — Z1509 Genetic susceptibility to other malignant neoplasm: Secondary | ICD-10-CM

## 2014-05-22 DIAGNOSIS — Z1501 Genetic susceptibility to malignant neoplasm of breast: Secondary | ICD-10-CM

## 2014-05-22 DIAGNOSIS — C50911 Malignant neoplasm of unspecified site of right female breast: Secondary | ICD-10-CM

## 2014-07-03 ENCOUNTER — Encounter: Payer: Self-pay | Admitting: Family Medicine

## 2014-07-03 ENCOUNTER — Ambulatory Visit (INDEPENDENT_AMBULATORY_CARE_PROVIDER_SITE_OTHER): Payer: 59 | Admitting: Family Medicine

## 2014-07-03 ENCOUNTER — Telehealth: Payer: Self-pay

## 2014-07-03 VITALS — BP 124/80 | HR 98 | Temp 98.7°F | Resp 16 | Ht 65.0 in | Wt 293.0 lb

## 2014-07-03 DIAGNOSIS — J309 Allergic rhinitis, unspecified: Secondary | ICD-10-CM | POA: Insufficient documentation

## 2014-07-03 DIAGNOSIS — J019 Acute sinusitis, unspecified: Secondary | ICD-10-CM | POA: Insufficient documentation

## 2014-07-03 DIAGNOSIS — J0100 Acute maxillary sinusitis, unspecified: Secondary | ICD-10-CM | POA: Diagnosis not present

## 2014-07-03 DIAGNOSIS — J3089 Other allergic rhinitis: Secondary | ICD-10-CM

## 2014-07-03 DIAGNOSIS — J029 Acute pharyngitis, unspecified: Secondary | ICD-10-CM | POA: Insufficient documentation

## 2014-07-03 LAB — POCT RAPID STREP A (OFFICE): RAPID STREP A SCREEN: NEGATIVE

## 2014-07-03 MED ORDER — PREDNISONE 5 MG PO TABS: 5.0000 mg | ORAL_TABLET | Freq: Two times a day (BID) | ORAL | Status: AC

## 2014-07-03 MED ORDER — METHYLPREDNISOLONE ACETATE 80 MG/ML IJ SUSP
80.0000 mg | Freq: Once | INTRAMUSCULAR | Status: AC
  Administered 2014-07-03: 80 mg via INTRAMUSCULAR

## 2014-07-03 MED ORDER — FLUTICASONE PROPIONATE 50 MCG/ACT NA SUSP: 2.0000 | Freq: Every day | NASAL | Status: DC

## 2014-07-03 MED ORDER — MOMETASONE FUROATE 50 MCG/ACT NA SUSP: 2.0000 | Freq: Every day | NASAL | Status: DC

## 2014-07-03 MED ORDER — SULFAMETHOXAZOLE-TRIMETHOPRIM 800-160 MG PO TABS: 1.0000 | ORAL_TABLET | Freq: Two times a day (BID) | ORAL | Status: DC

## 2014-07-03 NOTE — Telephone Encounter (Signed)
9:15 this am work in  pls

## 2014-07-03 NOTE — Assessment & Plan Note (Signed)
Antibiotic prescribed for 1 week

## 2014-07-03 NOTE — Assessment & Plan Note (Signed)
Unchanged Patient re-educated about  the importance of commitment to a  minimum of 150 minutes of exercise per week.  The importance of healthy food choices with portion control discussed. Encouraged to start a food diary, count calories and to consider  joining a support group. Sample diet sheets offered. Goals set by the patient for the next several months.   Weight /BMI 07/03/2014 05/09/2014 05/02/2014  WEIGHT 293 lb 293 lb 3.2 oz 295 lb  HEIGHT 5\' 5"  5\' 4"  5\' 5"   BMI 48.76 kg/m2 50.3 kg/m2 49.09 kg/m2    Current exercise per week 100 minutes.

## 2014-07-03 NOTE — Progress Notes (Signed)
   Subjective:    Patient ID: Kathleen Reynolds, female    DOB: 06-04-1960, 54 y.o.   MRN: [id number]  HPI 1 week h/o progressively worsening head congestion with ear pain , and sore throat, denies cough, c/o excess sneezing preceding the episode   Review of Systems See HPI Denies recent fever has had r chills.  Denies chest pains, palpitations and leg swelling Denies abdominal pain, nausea, vomiting,diarrhea or constipation.   Denies dysuria, frequency, hesitancy or incontinence. Denies joint pain, swelling and limitation in mobility. Denies headaches, seizures, numbness, or tingling. Denies depression, anxiety or insomnia. Denies skin break down or rash.        Objective:   Physical Exam BP 124/80 mmHg  Pulse 98  Temp(Src) 98.7 F (37.1 C) (Oral)  Resp 16  Ht 5\' 5"  (1.651 m)  Wt 293 lb (132.904 kg)  BMI 48.76 kg/m2  SpO2 98%  LMP 03/27/2011 Patient alert and oriented and in no cardiopulmonary distress.  HEENT: No facial asymmetry, EOMI,   oropharynx pink and moist.No exudate.  Neck supple no JVD, left anterior cervical adenitis, bilateral maxillary sinus tenderness  Chest: Clear to auscultation bilaterally.  CVS: S1, S2 no murmurs, no S3.Regular rate.  ABD: Soft non tender.   Ext: No edema  MS: Adequate ROM spine, shoulders, hips and knees.  Skin: Intact, no ulcerations or rash noted.  Psych: Good eye contact, normal affect. Memory intact not anxious or depressed appearing.  CNS: CN 2-12 intact, power,  normal throughout.no focal deficits noted.        Assessment & Plan:  Allergic rhinitis Uncontrolled, depo medrol i i office then prednisone x 5 days, steroid nasal spray and singulair   Acute sinusitis Antibiotic prescribed for 1 week   Obesity, morbid, BMI 40.0-49.9 Unchanged Patient re-educated about  the importance of commitment to a  minimum of 150 minutes of exercise per week.  The importance of healthy food choices with portion control  discussed. Encouraged to start a food diary, count calories and to consider  joining a support group. Sample diet sheets offered. Goals set by the patient for the next several months.   Weight /BMI 07/03/2014 05/09/2014 05/02/2014  WEIGHT 293 lb 293 lb 3.2 oz 295 lb  HEIGHT 5\' 5"  5\' 4"  5\' 5"   BMI 48.76 kg/m2 50.3 kg/m2 49.09 kg/m2    Current exercise per week 100 minutes.    Acute pharyngitis Negative rapid strep test in office

## 2014-07-03 NOTE — Assessment & Plan Note (Signed)
Uncontrolled, depo medrol i i office then prednisone x 5 days, steroid nasal spray and singulair

## 2014-07-03 NOTE — Assessment & Plan Note (Signed)
Negative rapid strep test in office

## 2014-07-03 NOTE — Patient Instructions (Signed)
F/u in June as beforer , call if you need me sooner  You are treated with 2 meds for uncontrolled allergies, singulair and nasonex daily and 5 day course of prednisone.  Derpomedrol in office today  For sinus infection, septra prescribed for 1 week  Work excuse from yesterday return on June 2  Hop you feel better soon

## 2014-07-17 ENCOUNTER — Telehealth: Payer: Self-pay | Admitting: Gastroenterology

## 2014-07-17 NOTE — Telephone Encounter (Signed)
Patient is on the July recall list to have HFP labs done

## 2014-07-18 ENCOUNTER — Telehealth: Payer: Self-pay | Admitting: Gastroenterology

## 2014-07-18 ENCOUNTER — Other Ambulatory Visit: Payer: Self-pay

## 2014-07-18 DIAGNOSIS — D509 Iron deficiency anemia, unspecified: Secondary | ICD-10-CM

## 2014-07-18 NOTE — Telephone Encounter (Signed)
Patient is NIC'ed on the July recall to have HFP labs done

## 2014-07-18 NOTE — Telephone Encounter (Signed)
[facility] has orders to mail out with letter

## 2014-07-20 ENCOUNTER — Other Ambulatory Visit: Payer: Self-pay

## 2014-07-20 ENCOUNTER — Other Ambulatory Visit (HOSPITAL_COMMUNITY): Payer: Self-pay

## 2014-07-20 DIAGNOSIS — D509 Iron deficiency anemia, unspecified: Secondary | ICD-10-CM

## 2014-07-20 DIAGNOSIS — C50919 Malignant neoplasm of unspecified site of unspecified female breast: Secondary | ICD-10-CM

## 2014-08-01 ENCOUNTER — Encounter (HOSPITAL_BASED_OUTPATIENT_CLINIC_OR_DEPARTMENT_OTHER): Payer: 59

## 2014-08-01 ENCOUNTER — Encounter (HOSPITAL_COMMUNITY): Payer: 59 | Attending: Hematology & Oncology | Admitting: Hematology & Oncology

## 2014-08-01 ENCOUNTER — Ambulatory Visit (INDEPENDENT_AMBULATORY_CARE_PROVIDER_SITE_OTHER): Payer: 59 | Admitting: Family Medicine

## 2014-08-01 ENCOUNTER — Encounter: Payer: Self-pay | Admitting: Family Medicine

## 2014-08-01 VITALS — BP 120/73 | HR 66 | Temp 97.9°F | Resp 20 | Wt 294.9 lb

## 2014-08-01 VITALS — BP 130/78 | HR 93 | Resp 16 | Ht 65.0 in | Wt 295.0 lb

## 2014-08-01 DIAGNOSIS — C50912 Malignant neoplasm of unspecified site of left female breast: Secondary | ICD-10-CM

## 2014-08-01 DIAGNOSIS — E785 Hyperlipidemia, unspecified: Secondary | ICD-10-CM

## 2014-08-01 DIAGNOSIS — R740 Nonspecific elevation of levels of transaminase and lactic acid dehydrogenase [LDH]: Secondary | ICD-10-CM

## 2014-08-01 DIAGNOSIS — Z853 Personal history of malignant neoplasm of breast: Secondary | ICD-10-CM | POA: Diagnosis not present

## 2014-08-01 DIAGNOSIS — E8881 Metabolic syndrome: Secondary | ICD-10-CM | POA: Diagnosis not present

## 2014-08-01 DIAGNOSIS — G44019 Episodic cluster headache, not intractable: Secondary | ICD-10-CM

## 2014-08-01 DIAGNOSIS — C50919 Malignant neoplasm of unspecified site of unspecified female breast: Secondary | ICD-10-CM

## 2014-08-01 DIAGNOSIS — R7303 Prediabetes: Secondary | ICD-10-CM

## 2014-08-01 DIAGNOSIS — F418 Other specified anxiety disorders: Secondary | ICD-10-CM

## 2014-08-01 DIAGNOSIS — Z1501 Genetic susceptibility to malignant neoplasm of breast: Secondary | ICD-10-CM | POA: Diagnosis not present

## 2014-08-01 DIAGNOSIS — R7401 Elevation of levels of liver transaminase levels: Secondary | ICD-10-CM

## 2014-08-01 DIAGNOSIS — R7309 Other abnormal glucose: Secondary | ICD-10-CM | POA: Diagnosis not present

## 2014-08-01 DIAGNOSIS — R7402 Elevation of levels of lactic acid dehydrogenase (LDH): Secondary | ICD-10-CM

## 2014-08-01 DIAGNOSIS — Z1509 Genetic susceptibility to other malignant neoplasm: Secondary | ICD-10-CM

## 2014-08-01 DIAGNOSIS — R42 Dizziness and giddiness: Secondary | ICD-10-CM

## 2014-08-01 LAB — HEPATIC FUNCTION PANEL
ALBUMIN: 4.2 g/dL (ref 3.5–5.2)
ALK PHOS: 64 U/L (ref 39–117)
ALT: 22 U/L (ref 0–35)
AST: 15 U/L (ref 0–37)
Bilirubin, Direct: 0.1 mg/dL (ref 0.0–0.3)
Indirect Bilirubin: 0.3 mg/dL (ref 0.2–1.2)
TOTAL PROTEIN: 7.2 g/dL (ref 6.0–8.3)
Total Bilirubin: 0.4 mg/dL (ref 0.2–1.2)

## 2014-08-01 LAB — COMPREHENSIVE METABOLIC PANEL
ALT: 27 U/L (ref 14–54)
AST: 22 U/L (ref 15–41)
Albumin: 4.2 g/dL (ref 3.5–5.0)
Alkaline Phosphatase: 63 U/L (ref 38–126)
Anion gap: 9 (ref 5–15)
BUN: 15 mg/dL (ref 6–20)
CALCIUM: 8.9 mg/dL (ref 8.9–10.3)
CO2: 25 mmol/L (ref 22–32)
Chloride: 103 mmol/L (ref 101–111)
Creatinine, Ser: 0.69 mg/dL (ref 0.44–1.00)
GFR calc non Af Amer: >60 mL/min (ref >60)
Glucose, Bld: 86 mg/dL (ref 65–99)
Potassium: 3.8 mmol/L (ref 3.5–5.1)
SODIUM: 137 mmol/L (ref 135–145)
TOTAL PROTEIN: 7.6 g/dL (ref 6.5–8.1)
Total Bilirubin: 0.6 mg/dL (ref 0.3–1.2)

## 2014-08-01 LAB — CBC
HEMATOCRIT: 37.7 % (ref 36.0–46.0)
Hemoglobin: 13 g/dL (ref 12.0–15.0)
MCH: 31.7 pg (ref 26.0–34.0)
MCHC: 34.5 g/dL (ref 30.0–36.0)
MCV: 92 fL (ref 78.0–100.0)
Platelets: 210 10*3/uL (ref 150–400)
RBC: 4.1 MIL/uL (ref 3.87–5.11)
RDW: 14.8 % (ref 11.5–15.5)
WBC: 4.8 10*3/uL (ref 4.0–10.5)

## 2014-08-01 LAB — LIPID PANEL
Cholesterol: 211 mg/dL — ABNORMAL HIGH (ref 0–200)
HDL: 63 mg/dL (ref >=46)
LDL CALC: 121 mg/dL — AB (ref 0–99)
TRIGLYCERIDES: 137 mg/dL (ref <150)
Total CHOL/HDL Ratio: 3.3 Ratio
VLDL: 27 mg/dL (ref 0–40)

## 2014-08-01 LAB — HEMOGLOBIN A1C
Hgb A1c MFr Bld: 6.1 % — ABNORMAL HIGH (ref <5.7)
Mean Plasma Glucose: 128 mg/dL — ABNORMAL HIGH (ref <117)

## 2014-08-01 NOTE — Patient Instructions (Addendum)
F/u early Jan , call if you need me before  Flu vaccine available in sept  Commit to daily flonase  It is important that you exercise regularly at least 30 minutes7 times a week. If you develop chest pain, have severe difficulty breathing, or feel very tired, stop exercising immediately and seek medical attention     A healthy diet is rich in fruit, vegetables and whole grains. Poultry fish, nuts and beans are a healthy choice for protein rather then red meat. A low sodium diet and drinking 64 ounces of water daily is generally recommended. Oils and sweet should be limited. Carbohydrates especially for those who are diabetic or overweight, should be limited to 34-45 gram per meal. It is important to eat on a regular schedule, at least 3 times daily. Snacks should be primarily fruits, vegetables or nuts.   Fasting labs on day of draw with oncology clinic , this order will be mailed next month   Tsh, a1c,lipid, chem 7 egfr

## 2014-08-01 NOTE — Progress Notes (Signed)
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 Fanny Skates on 02/13/2011 followed by sentinel node biopsy which was negative. Her Ki-67 marker was high at 54% HER-2/neu was nonamplified and she was ER/PR negative. She is now status post 6 cycles of carboplatin and Taxotere and S/P radiation. No evidence of recurrence.  #2.Left-sided breast cancer stage II grade 3 triple negative at that time as well with 7 negative nodes but a 2.3 cm primary with mastectomy on 11/27/1997. She was treated with AC. x4 cycles followed by Taxol x4 cycles thus far without recurrence.  #3. BRCA1 positivity.   CURRENT THERAPY:Observation and surveillance   INTERVAL HISTORY: Kathleen Reynolds 54 y.o. 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 Fanny Skates on 02/13/2011 followed by sentinel node biopsy which was negative. Her Ki-67 marker was high at 54% HER-2/neu was nonamplified and she was ER/PR negative. She is now status post 6 cycles of carboplatin and Taxotere (04/03/11- 07/16/11) and S/P radiation finishing in August 2013. AND History of left-sided breast cancer stage II grade 3 triple negative  with 7 negative nodes but a 2.3 cm primary with mastectomy on 11/27/1997. She was treated with AC. x4 cycles followed by Taxol x4 cycles thus far without recurrence.  She has discussed with Dr. Garwin Brothers (Gynecologist) an oophorectomy due to her BRCA 1 mutation but states that has not been pursued. She notes that she was encouraged to loose weight but has not been able to. He has tried Weight Watchers and states it was too expensive. She wants  to lose weight on her own. She finds increasing her activity level is difficult because of her work schedule.  The patient is present alone today and says she is doing well. She states that her weight loss is not going well however she will begin to work on this more now that she has quit one of her jobs. She has recently seen Dr. Moshe Cipro for upper respiratory infection. She is improving. The patient has not had her son gentically tested. The patient expresses concern for the cause of her hair loss.  She says that it no longer grows and is thinning out drastically.  This disappoints her greatly. She denies chest pain and SOB.She denies a change in appetite or energy level. She has no new pain.   Past Medical History  Diagnosis Date  . Migraines   . Breast mass in female     right  . Arthritis   . Hyperlipidemia   . Breast cancer     Right  . Left rotator cuff tear   . Morbid obesity   . Anemia LIFELONG    HEAVY MENSES MAR 2012 HB 13 MCV 86.7 FERRITIN  66  . 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 RUPTURE ROTATOR CUFF 06/08/2007    Qualifier: Diagnosis of  By: Aline Brochure MD, Dorothyann Peng      has Malignant neoplasm of breast (female); Thyroid nodule; Dyslipidemia; Headache; INGUINAL PAIN, RIGHT; Screening for colon cancer; Prediabetes; Invasive ductal carcinoma of right breast, stage 1; BRCA1 positive; OA (osteoarthritis) of knee; Anemia; Obesity, morbid, BMI 40.0-49.9; Metabolic syndrome X; Achilles tendonitis,  bilateral; Bilateral foot pain; Pain in joint, ankle and foot; Stiffness of joint, not elsewhere classified, ankle and foot; Shoulder pain, right; Muscle weakness (generalized); Rotator cuff syndrome; Depression with anxiety; Hand pain, right; Right forearm pain; Nonspecific elevation of levels of transaminase or lactic acid dehydrogenase (LDH); Vertigo, intermittent; Allergic rhinitis; Acute sinusitis; and Acute pharyngitis on her problem list.     is allergic to ibuprofen.  [name] had no  medications administered during this visit.  Past Surgical History  Procedure Laterality Date  . Dilation and curettage of uterus    . Colonoscopy  2004 DR. SMITH    No polyp, hemorrhoids  . Colonoscopy  06/09/10    GEZ:MOQHUT adenomas   . Hystersonogram  2009  . Dilation and curettage of uterus  05/2002  . Mastectomy  1999    left breast cancer   . Breast surgery  11/1997    Left breast cancer-mastectomy  . Breast biopsy  02/13/2011    Procedure: BREAST BIOPSY WITH NEEDLE LOCALIZATION;  Surgeon: Adin Hector, MD;  Location: [facility];  Service: General;  Laterality: Right;  . Breast surgery  2/13-    rt axilly bx  . Portacath placement  03/27/2011    Procedure: INSERTION PORT-A-CATH;  Surgeon: Adin Hector, MD;  Location: [facility];  Service: General;  Laterality: Left;  insert port a cath  . Port-a-cath removal  10/20/2011    Procedure: MINOR REMOVAL PORT-A-CATH;  Surgeon: Adin Hector, MD;  Location: [facility];  Service: General;  Laterality: N/A;  Porta-cath removal  left    Denies any headaches, dizziness, double vision, fevers, chills, night sweats, nausea, vomiting, diarrhea, constipation, chest pain, heart palpitations, shortness of breath, blood in stool, black tarry stool, urinary pain, urinary burning, urinary frequency, hematuria.  14 point review of systems was performed and is negative except as detailed under history of present illness and above    PHYSICAL EXAMINATION  ECOG PERFORMANCE STATUS: 0 - Asymptomatic  Filed Vitals:   08/01/14 1309  BP: 120/73  Pulse: 66  Temp: 97.9 F (36.6 C)  Resp: 20    GENERAL:alert, no distress, well nourished, well developed, comfortable, cooperative, morbidly obese and smiling  SKIN: skin color, texture, turgor are normal, no rashes or significant lesions  HEAD: Normocephalic, No masses, lesions, tenderness or abnormalities  EYES: normal, Conjunctiva are pink and  non-injected  EARS: External ears normal  OROPHARYNX:lips, buccal mucosa, and tongue normal and mucous membranes are moist  NECK: supple, no adenopathy, thyroid normal size, non-tender, without nodularity, no stridor, non-tender, trachea midline  LYMPH: no palpable lymphadenopathy, no hepatosplenomegaly  BREAST:right breast normal without mass, skin or nipple changes or axillary nodes, hyperpigmentation and erythema secondary to radiation with a small fibrotic area in the 7-8 o'clock position. Questionable area of abnormality at the 8 oclock position. (patient is large breasted)  Left post-mastectomy site well healed and free of suspicious changes. LUNGS: clear to auscultation and percussion  HEART: regular rate & rhythm, no murmurs, no gallops, S1 normal and S2 normal  ABDOMEN:abdomen soft, non-tender, morbidly obese and normal bowel sounds  BACK: Back symmetric, no curvature., No CVA tenderness  EXTREMITIES:less then 2 second capillary refill, no joint deformities, effusion, or inflammation, no edema, no skin discoloration, no clubbing, no cyanosis  NEURO: alert & oriented x 3 with fluent speech, no focal motor/sensory deficits, gait normal   LABORATORY DATA: CBC    Component Value Date/Time   WBC 4.8  08/01/2014 1256   RBC 4.10 08/01/2014 1256   HGB 13.0 08/01/2014 1256   HCT 37.7 08/01/2014 1256   PLT 210 08/01/2014 1256   MCV 92.0 08/01/2014 1256   MCH 31.7 08/01/2014 1256   MCHC 34.5 08/01/2014 1256   RDW 14.8 08/01/2014 1256   LYMPHSABS 1.6 05/01/2014 1050   MONOABS 0.3 05/01/2014 1050   EOSABS 0.1 05/01/2014 1050   BASOSABS 0.0 05/01/2014 1050      Chemistry      Component Value Date/Time   NA 139 05/01/2014 1050   K 4.0 05/01/2014 1050   CL 107 05/01/2014 1050   CO2 24 05/01/2014 1050   BUN 16 05/01/2014 1050   CREATININE 0.65 05/01/2014 1050   CREATININE 0.75 12/18/2013 0741      Component Value Date/Time   CALCIUM 8.9 05/01/2014 1050   ALKPHOS 64 07/31/2014  0706   AST 15 07/31/2014 0706   ALT 22 07/31/2014 0706   BILITOT 0.4 07/31/2014 0706        ASSESSMENT:  1. 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 Fanny Skates on 02/13/2011 followed by sentinel node biopsy which was negative. Her Ki-67 marker was high at 54% HER-2/neu was nonamplified and she was ER/PR negative. She is now status post 6 cycles of carboplatin and Taxotere and S/P radiation. No evidence of recurrence.  2. BRCA1 positivity.  3. History of left-sided breast cancer stage II grade 3 triple negative at that time as well with 7 negative nodes but a 2.3 cm primary with mastectomy on 11/27/1997. She was treated with AC. x4 cycles followed by Taxol x4 cycles thus far without recurrence.   PLAN:   I spent a significant portion of her visit today discussing BRCA 1 mutation and the risk of breast and ovarian cancer. I put this in perspective for her as she stated she did not have a good understanding of her risk. We also talked about options including ongoing mammography and breast MRI versus mastectomy with reconstruction. We discussed the risks and benefits associated with both. She and I discussed these issues at her last visit but I feel she still has a poor understanding of her risks.  We discussed different options for weight loss and again she is adamant she will do this on her own. I would like for her to see her gynecologist again for consideration of oophorectomy.  I would like to see her back in 6 months for ongoing follow-up, we will also continue to discuss all of the above.  If she has problems or concerns prior to follow-up she is to let us know.   I will follow the NCCN guidelines recommendations for increased risk breast cancer patient due to her BRCA 1 positivity and history of 2 breast cancers at the age of 59.  Will perform yearly mammogram and MRI breast alternating so a screening test will be performed  every 6 months. She will continue with a breast MRI every April. She has an upcoming mammogram in November of this year. She was also given a prescription for mastectomy bras and prosthesis.  All questions were answered. The patient knows to call the clinic with any problems, questions or concerns. We can certainly see the patient much sooner if necessary.   This note was signed electronically I have reviewed the above documentation for accuracy and completeness and I agree with the above.  Molli Hazard, MD

## 2014-08-01 NOTE — Patient Instructions (Signed)
[place] at [facility] Discharge Instructions  RECOMMENDATIONS MADE BY THE CONSULTANT AND ANY TEST RESULTS WILL BE SENT TO YOUR REFERRING PHYSICIAN.  Follow up in 6 months. Report any issues/concerns to clinic as needed.  Thank you for choosing [place] at [facility] to provide your oncology and hematology care.  To afford each patient quality time with our provider, please arrive at least 15 minutes before your scheduled appointment time.    You need to re-schedule your appointment should you arrive 10 or more minutes late.  We strive to give you quality time with our providers, and arriving late affects you and other patients whose appointments are after yours.  Also, if you no show three or more times for appointments you may be dismissed from the clinic at the providers discretion.     Again, thank you for choosing [facility].  Our hope is that these requests will decrease the amount of time that you wait before being seen by our physicians.       _____________________________________________________________  Should you have questions after your visit to [facility], please contact our office at [phone] between the hours of 8:30 a.m. and 4:30 p.m.  Voicemails left after 4:30 p.m. will not be returned until the following business day.  For prescription refill requests, have your pharmacy contact our office.

## 2014-08-02 ENCOUNTER — Ambulatory Visit: Payer: 59 | Admitting: Family Medicine

## 2014-08-02 NOTE — Assessment & Plan Note (Signed)
No current or recent episode

## 2014-08-02 NOTE — Assessment & Plan Note (Signed)
Improved Hyperlipidemia:Low fat diet discussed and encouraged.   Lipid Panel  Lab Results  Component Value Date   CHOL 211* 07/31/2014   HDL 63 07/31/2014   LDLCALC 121* 07/31/2014   TRIG 137 07/31/2014   CHOLHDL 3.3 07/31/2014

## 2014-08-02 NOTE — Progress Notes (Signed)
Kathleen Reynolds     MRN: [id number]      DOB: Sep 03, 1960   HPI Kathleen Reynolds is here for follow up and re-evaluation of chronic medical conditions, medication management and review of any available recent lab and radiology data.  Preventive health is updated, specifically  Cancer screening and Immunization.   Questions or concerns regarding consultations or procedures which the PT has had in the interim are  addressed. The PT denies any adverse reactions to current medications since the last visit.  There are no new concerns.  There are no specific complaints  Just starting exercise, plans to start at 3 days per week minimum, has recently stopped one job  ROS Denies recent fever or chills. Denies sinus pressure, nasal congestion, ear pain or sore throat. Denies chest congestion, productive cough or wheezing. Denies chest pains, palpitations and leg swelling Denies abdominal pain, nausea, vomiting,diarrhea or constipation.   Denies dysuria, frequency, hesitancy or incontinence. Denies joint pain, swelling and limitation in mobility. Denies headaches, seizures, numbness, or tingling. Denies depression, anxiety or insomnia. Denies skin break down or rash.   PE  BP 130/78 mmHg  Pulse 93  Resp 16  Ht 5\' 5"  (1.651 m)  Wt 295 lb (133.811 kg)  BMI 49.09 kg/m2  SpO2 98%  LMP 03/27/2011  Patient alert and oriented and in no cardiopulmonary distress.  HEENT: No facial asymmetry, EOMI,   oropharynx pink and moist.  Neck supple no JVD, no mass.  Chest: Clear to auscultation bilaterally.  CVS: S1, S2 no murmurs, no S3.Regular rate.  ABD: Soft non tender.   Ext: No edema  MS: Adequate ROM spine, shoulders, hips and knees.  Skin: Intact, no ulcerations or rash noted.  Psych: Good eye contact, normal affect. Memory intact not anxious or depressed appearing.  CNS: CN 2-12 intact, power,  normal throughout.no focal deficits noted.   Assessment &  Plan   Prediabetes Patient educated about the importance of limiting  Carbohydrate intake , the need to commit to daily physical activity for a minimum of 30 minutes , and to commit weight loss. The fact that changes in all these areas will reduce or eliminate all together the development of diabetes is stressed.   Diabetic Labs Latest Ref Rng 08/01/2014 07/31/2014 05/01/2014 12/18/2013 10/10/2013  HbA1c <5.7 % - 6.1(H) - 5.9(H) -  Chol 0 - 200 mg/dL - 211(H) - 201(H) -  HDL >=46 mg/dL - 63 - 57 -  Calc LDL 0 - 99 mg/dL - 121(H) - 116(H) -  Triglycerides <150 mg/dL - 137 - 138 -  Creatinine 0.44 - 1.00 mg/dL 0.69 - 0.65 0.75 0.66   BP/Weight 08/01/2014 08/01/2014 07/03/2014 05/09/2014 05/02/2014 05/01/2014 4/85/4627  Systolic BP 035 009 381 829 937 169 678  Diastolic BP 78 73 80 74 82 77 70  Wt. (Lbs) 295 294.9 293 293.2 295 293.5 294.4  BMI 49.09 49.07 48.76 50.3 49.09 48.84 48.99   No flowsheet data found. Unchanged, currently on metformin 1000 mg daily    Dyslipidemia Improved Hyperlipidemia:Low fat diet discussed and encouraged.   Lipid Panel  Lab Results  Component Value Date   CHOL 211* 07/31/2014   HDL 63 07/31/2014   LDLCALC 121* 07/31/2014   TRIG 137 07/31/2014   CHOLHDL 3.3 07/31/2014        Obesity, morbid, BMI 40.0-49.9 Deteriorated. Patient re-educated about  the importance of commitment to a  minimum of 150 minutes of exercise per week.  The importance of healthy food  choices with portion control discussed. Encouraged to start a food diary, count calories and to consider  joining a support group. Sample diet sheets offered. Goals set by the patient for the next several months.   Weight /BMI 08/01/2014 08/01/2014 07/03/2014  WEIGHT 295 lb 294 lb 14.4 oz 293 lb  HEIGHT 5\' 5"  - 5\' 5"   BMI 49.09 kg/m2 49.07 kg/m2 48.76 kg/m2    Current exercise per week 40 minutes. Encouraged to seriously consider surgery   Metabolic syndrome X The increased risk of  cardiovascular disease associated with this diagnosis, and the need to consistently work on lifestyle to change this is discussed. Following  a  heart healthy diet ,commitment to 30 minutes of exercise at least 5 days per week, as well as control of blood sugar and cholesterol , and achieving a healthy weight are all the areas to be addressed .   Depression with anxiety Improved, on no medication  Nonspecific elevation of levels of transaminase or lactic acid dehydrogenase (LDH) Followed by GI, normal currently  Vertigo, intermittent No current or recent episode  Invasive ductal carcinoma of right breast, stage 1 Stable , followed bi annually by oncology  Headache Less frequent, and responds to fioricet x 1

## 2014-08-02 NOTE — Assessment & Plan Note (Signed)
Improved, on no medication

## 2014-08-02 NOTE — Assessment & Plan Note (Signed)
Deteriorated. Patient re-educated about  the importance of commitment to a  minimum of 150 minutes of exercise per week.  The importance of healthy food choices with portion control discussed. Encouraged to start a food diary, count calories and to consider  joining a support group. Sample diet sheets offered. Goals set by the patient for the next several months.   Weight /BMI 08/01/2014 08/01/2014 07/03/2014  WEIGHT 295 lb 294 lb 14.4 oz 293 lb  HEIGHT 5\' 5"  - 5\' 5"   BMI 49.09 kg/m2 49.07 kg/m2 48.76 kg/m2    Current exercise per week 40 minutes. Encouraged to seriously consider surgery

## 2014-08-02 NOTE — Assessment & Plan Note (Signed)
Less frequent, and responds to fioricet x 1

## 2014-08-02 NOTE — Assessment & Plan Note (Signed)
Patient educated about the importance of limiting  Carbohydrate intake , the need to commit to daily physical activity for a minimum of 30 minutes , and to commit weight loss. The fact that changes in all these areas will reduce or eliminate all together the development of diabetes is stressed.   Diabetic Labs Latest Ref Rng 08/01/2014 07/31/2014 05/01/2014 12/18/2013 10/10/2013  HbA1c <5.7 % - 6.1(H) - 5.9(H) -  Chol 0 - 200 mg/dL - 211(H) - 201(H) -  HDL >=46 mg/dL - 63 - 57 -  Calc LDL 0 - 99 mg/dL - 121(H) - 116(H) -  Triglycerides <150 mg/dL - 137 - 138 -  Creatinine 0.44 - 1.00 mg/dL 0.69 - 0.65 0.75 0.66   BP/Weight 08/01/2014 08/01/2014 07/03/2014 05/09/2014 05/02/2014 05/01/2014 0/60/0459  Systolic BP 977 414 239 532 023 343 568  Diastolic BP 78 73 80 74 82 77 70  Wt. (Lbs) 295 294.9 293 293.2 295 293.5 294.4  BMI 49.09 49.07 48.76 50.3 49.09 48.84 48.99   No flowsheet data found. Unchanged, currently on metformin 1000 mg daily

## 2014-08-02 NOTE — Assessment & Plan Note (Signed)
The increased risk of cardiovascular disease associated with this diagnosis, and the need to consistently work on lifestyle to change this is discussed. Following  a  heart healthy diet ,commitment to 30 minutes of exercise at least 5 days per week, as well as control of blood sugar and cholesterol , and achieving a healthy weight are all the areas to be addressed .  

## 2014-08-02 NOTE — Assessment & Plan Note (Signed)
Followed by GI, normal currently

## 2014-08-02 NOTE — Progress Notes (Signed)
LABS DRAWN

## 2014-08-02 NOTE — Assessment & Plan Note (Signed)
Stable , followed bi annually by oncology

## 2014-08-06 NOTE — Telephone Encounter (Signed)
PLEASE CALL PT. HER LIVER TESTS ARE NORMAL.

## 2014-08-07 ENCOUNTER — Encounter (HOSPITAL_COMMUNITY): Payer: Self-pay | Admitting: Hematology & Oncology

## 2014-08-07 NOTE — Telephone Encounter (Signed)
Letter mailed to pt, results normal.

## 2014-08-07 NOTE — Telephone Encounter (Signed)
Tried to call with no answer  

## 2014-08-27 ENCOUNTER — Encounter: Payer: Self-pay | Admitting: Family Medicine

## 2014-08-27 ENCOUNTER — Ambulatory Visit (INDEPENDENT_AMBULATORY_CARE_PROVIDER_SITE_OTHER): Payer: 59 | Admitting: Family Medicine

## 2014-08-27 VITALS — BP 140/80 | HR 69 | Resp 16 | Ht 65.0 in | Wt 296.0 lb

## 2014-08-27 DIAGNOSIS — E785 Hyperlipidemia, unspecified: Secondary | ICD-10-CM | POA: Diagnosis not present

## 2014-08-27 DIAGNOSIS — R7309 Other abnormal glucose: Secondary | ICD-10-CM

## 2014-08-27 DIAGNOSIS — M5431 Sciatica, right side: Secondary | ICD-10-CM

## 2014-08-27 DIAGNOSIS — R42 Dizziness and giddiness: Secondary | ICD-10-CM

## 2014-08-27 DIAGNOSIS — Z113 Encounter for screening for infections with a predominantly sexual mode of transmission: Secondary | ICD-10-CM | POA: Diagnosis not present

## 2014-08-27 DIAGNOSIS — R7303 Prediabetes: Secondary | ICD-10-CM

## 2014-08-27 DIAGNOSIS — M5386 Other specified dorsopathies, lumbar region: Secondary | ICD-10-CM | POA: Insufficient documentation

## 2014-08-27 MED ORDER — GABAPENTIN 300 MG PO CAPS: 300.0000 mg | ORAL_CAPSULE | Freq: Two times a day (BID) | ORAL | Status: DC

## 2014-08-27 MED ORDER — METHYLPREDNISOLONE ACETATE 80 MG/ML IJ SUSP
120.0000 mg | Freq: Once | INTRAMUSCULAR | Status: AC
  Administered 2014-08-27: 120 mg via INTRAMUSCULAR

## 2014-08-27 MED ORDER — PREDNISONE 10 MG (21) PO TBPK: 10.0000 mg | ORAL_TABLET | Freq: Every day | ORAL | Status: DC

## 2014-08-27 MED ORDER — HYDROCODONE-ACETAMINOPHEN 7.5-325 MG PO TABS: ORAL_TABLET | ORAL | Status: DC

## 2014-08-27 NOTE — Assessment & Plan Note (Addendum)
5 day h/o acute pain radiaiting to groin  And knee  Depomedrol then prednisone dose pack, gabapentin

## 2014-08-27 NOTE — Patient Instructions (Signed)
F/u as before  You have sciatica  Injection in office and medication sent in  Fasting lipid, cmp and EGFR, HBA1C , TSH and HIV in December please  Work excuse form today to return in 1 week  Thanks for choosing [facility], we consider it a privelige to serve you.  Sciatica Sciatica is pain, weakness, numbness, or tingling along your sciatic nerve. The nerve starts in the lower back and runs down the back of each leg. Nerve damage or certain conditions pinch or put pressure on the sciatic nerve. This causes the pain, weakness, and other discomforts of sciatica. HOME CARE   Only take medicine as told by your doctor.  Apply ice to the affected area for 20 minutes. Do this 3-4 times a day for the first 48-72 hours. Then try heat in the same way.  Exercise, stretch, or do your usual activities if these do not make your pain worse.  Go to physical therapy as told by your doctor.  Keep all doctor visits as told.  Do not wear high heels or shoes that are not supportive.  Get a firm mattress if your mattress is too soft to lessen pain and discomfort. GET HELP RIGHT AWAY IF:   You cannot control when you poop (bowel movement) or pee (urinate).  You have more weakness in your lower back, lower belly (pelvis), butt (buttocks), or legs.  You have redness or puffiness (swelling) of your back.  You have a burning feeling when you pee.  You have pain that gets worse when you lie down.  You have pain that wakes you from your sleep.  Your pain is worse than past pain.  Your pain lasts longer than 4 weeks.  You are suddenly losing weight without reason. MAKE SURE YOU:   Understand these instructions.  Will watch this condition.  Will get help right away if you are not doing well or get worse. Document Released: 10/29/2007 Document Revised: 07/21/2011 Document Reviewed: 05/31/2011 [facility] Patient Information 2015 [place], [place]. This information is not intended to  replace advice given to you by your health care provider. Make sure you discuss any questions you have with your health care provider.

## 2014-10-07 NOTE — Assessment & Plan Note (Signed)
Deteriorated. Patient re-educated about  the importance of commitment to a  minimum of 150 minutes of exercise per week.  The importance of healthy food choices with portion control discussed. Encouraged to start a food diary, count calories and to consider  joining a support group. Sample diet sheets offered. Goals set by the patient for the next several months.   Weight /BMI 08/27/2014 08/01/2014 08/01/2014  WEIGHT 296 lb 295 lb 294 lb 14.4 oz  HEIGHT 5\' 5"  5\' 5"  -  BMI 49.26 kg/m2 49.09 kg/m2 49.07 kg/m2    Current exercise per week 40 minutes.

## 2014-10-07 NOTE — Assessment & Plan Note (Signed)
No current flare, less frequent and severe, keeps antivert for as needed use

## 2014-10-07 NOTE — Progress Notes (Signed)
   Kathleen Reynolds     MRN: [id number]      DOB: Aug 17, 1960   HPI Kathleen Reynolds is here for for acute onset low back and righ lower extremity pain and numbness , which started acutely 5 days ago. Symptoms have progresiveley worsened as far as pain is concerned. 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 Denies sinus pressure, nasal congestion, ear pain or sore throat. Denies chest congestion, productive cough or wheezing. Denies chest pains, palpitations and leg swelling Denies abdominal pain, nausea, vomiting,diarrhea or constipation.   Denies dysuria, frequency, hesitancy or incontinence. DDenies depression, anxiety or insomnia. Denies skin break down or rash.   PE  BP 140/80 mmHg  Pulse 69  Resp 16  Ht 5\' 5"  (1.651 m)  Wt 296 lb (134.265 kg)  BMI 49.26 kg/m2  SpO2 97%  LMP 02/22/2013t  Patient alert and oriented and in no cardiopulmonary distress. Pt in severe pain, unable to sit comfortably and unsteady gaitHEENT: No facial asymmetry, EOMI,   oropharynx pink and moist.  Neck supple no JVD, no mass.  Chest: Clear to auscultation bilaterally.  CVS: S1, S2 no murmurs, no S3.Regular rate.  ABD: Soft non tender.   Ext: No edema  MS: decreased  ROM spine, and right lower extremity .  Psych: Good eye contact, normal affect. Memory intact not anxious or depressed appearing.  CNS: CN 2-12 intact, power,  normal throughout.no focal deficits noted.   Assessment & Plan   Sciatica of right side associated with disorder of lumbar spine 5 day h/o acute pain radiaiting to groin  And knee  Depomedrol then prednisone dose pack, gabapentin  Obesity, morbid, BMI 40.0-49.9 Deteriorated. Patient re-educated about  the importance of commitment to a  minimum of 150 minutes of exercise per week.  The importance of healthy food choices with portion control  discussed. Encouraged to start a food diary, count calories and to consider  joining a support group. Sample diet sheets offered. Goals set by the patient for the next several months.   Weight /BMI 08/27/2014 08/01/2014 08/01/2014  WEIGHT 296 lb 295 lb 294 lb 14.4 oz  HEIGHT 5\' 5"  5\' 5"  -  BMI 49.26 kg/m2 49.09 kg/m2 49.07 kg/m2    Current exercise per week 40 minutes.   Vertigo, intermittent No current flare, less frequent and severe, keeps antivert for as needed use

## 2014-10-15 ENCOUNTER — Encounter: Payer: Self-pay | Admitting: Gastroenterology

## 2014-11-22 ENCOUNTER — Ambulatory Visit (INDEPENDENT_AMBULATORY_CARE_PROVIDER_SITE_OTHER): Payer: 59 | Admitting: Gastroenterology

## 2014-11-22 ENCOUNTER — Encounter: Payer: Self-pay | Admitting: Gastroenterology

## 2014-11-22 VITALS — BP 130/70 | HR 76 | Temp 97.4°F | Ht 67.0 in | Wt 299.4 lb

## 2014-11-22 DIAGNOSIS — D500 Iron deficiency anemia secondary to blood loss (chronic): Secondary | ICD-10-CM | POA: Diagnosis not present

## 2014-11-22 NOTE — Assessment & Plan Note (Signed)
NO WARNING SIGNS/SYMPTOMS. NO BRBPR OR MELENA.  CONTINUE TO MONITOR SYMPTOMS. FOLLOW UP IN 6 MOS.

## 2014-11-22 NOTE — Progress Notes (Signed)
ON RECALL  °

## 2014-11-22 NOTE — Assessment & Plan Note (Signed)
Weight up 6 lbs.  CONTINUE YOUR WEIGHT LOSS EFFORTS. Lose 10 lbs. FOLLOW A LOW FAT DIET.  HANDOUT GIVEN. Try water aerobics. FOLLOW UP IN 6 MOS.    GREATER THAN 50% WAS SPENT IN COUNSELING & COORDINATION OF CARE WITH THE PATIENT: DISCUSSED BENEFITS, RISKS, AND MANAGEMENT OF morbid obesity and fatty liver disease. TOTAL ENCOUNTER TIME: 15 MINS.

## 2014-11-22 NOTE — Patient Instructions (Signed)
DRINK WATER TO KEEP YOUR URINE LIGHT YELLOW.  FOLLOW A LOW FAT/high fiber DIET. SEE INFO BELOW.  CONTINUE YOUR WEIGHT LOSS EFFORTS. Lose 10 Lbs  TRY WATER AEROBICS AT THE Y.  FOLLOW UP IN 6 MOS. MERRY CHRISTMAS AND HAPPY NEW YEAR!   Low-Fat Diet BREADS, CEREALS, PASTA, RICE, DRIED PEAS, AND BEANS These products are high in carbohydrates and most are low in fat. Therefore, they can be increased in the diet as substitutes for fatty foods. They too, however, contain calories and should not be eaten in excess. Cereals can be eaten for snacks as well as for breakfast.   FRUITS AND VEGETABLES It is good to eat fruits and vegetables. Besides being sources of fiber, both are rich in vitamins and some minerals. They help you get the daily allowances of these nutrients. Fruits and vegetables can be used for snacks and desserts.  MEATS Limit lean meat, chicken, [place], and fish to no more than 6 ounces per day. Beef, Pork, and Lamb Use lean cuts of beef, pork, and lamb. Lean cuts include:  Extra-lean ground beef.  Arm roast.  Sirloin tip.  Center-cut ham.  Round steak.  Loin chops.  Rump roast.  Tenderloin.  Trim all fat off the outside of meats before cooking. It is not necessary to severely decrease the intake of red meat, but lean choices should be made. Lean meat is rich in protein and contains a highly absorbable form of iron. Premenopausal women, in particular, should avoid reducing lean red meat because this could increase the risk for low red blood cells (iron-deficiency anemia).  Chicken and [place] These are good sources of protein. The fat of poultry can be reduced by removing the skin and underlying fat layers before cooking. Chicken and [place] can be substituted for lean red meat in the diet. Poultry should not be fried or covered with high-fat sauces. Fish and Shellfish Fish is a good source of protein. Shellfish contain cholesterol, but they usually are low in saturated fatty  acids. The preparation of fish is important. Like chicken and [place], they should not be fried or covered with high-fat sauces. EGGS Egg whites contain no fat or cholesterol. They can be eaten often. Try 1 to 2 egg whites instead of whole eggs in recipes or use egg substitutes that do not contain yolk. MILK AND DAIRY PRODUCTS Use skim or 1% milk instead of 2% or whole milk. Decrease whole milk, natural, and processed cheeses. Use nonfat or low-fat (2%) cottage cheese or low-fat cheeses made from vegetable oils. Choose nonfat or low-fat (1 to 2%) yogurt. Experiment with evaporated skim milk in recipes that call for heavy cream. Substitute low-fat yogurt or low-fat cottage cheese for sour cream in dips and salad dressings. Have at least 2 servings of low-fat dairy products, such as 2 glasses of skim (or 1%) milk each day to help get your daily calcium intake. FATS AND OILS Reduce the total intake of fats, especially saturated fat. Butterfat, lard, and beef fats are high in saturated fat and cholesterol. These should be avoided as much as possible. Vegetable fats do not contain cholesterol, but certain vegetable fats, such as coconut oil, palm oil, and palm kernel oil are very high in saturated fats. These should be limited. These fats are often used in bakery goods, processed foods, popcorn, oils, and nondairy creamers. Vegetable shortenings and some peanut butters contain hydrogenated oils, which are also saturated fats. Read the labels on these foods and check for saturated  vegetable oils. Unsaturated vegetable oils and fats do not raise blood cholesterol. However, they should be limited because they are fats and are high in calories. Total fat should still be limited to 30% of your daily caloric intake. Desirable liquid vegetable oils are corn oil, cottonseed oil, olive oil, canola oil, safflower oil, soybean oil, and sunflower oil. Peanut oil is not as good, but small amounts are acceptable. Buy a  heart-healthy tub margarine that has no partially hydrogenated oils in the ingredients. Mayonnaise and salad dressings often are made from unsaturated fats, but they should also be limited because of their high calorie and fat content. Seeds, nuts, peanut butter, olives, and avocados are high in fat, but the fat is mainly the unsaturated type. These foods should be limited mainly to avoid excess calories and fat. OTHER EATING TIPS Snacks  Most sweets should be limited as snacks. They tend to be rich in calories and fats, and their caloric content outweighs their nutritional value. Some good choices in snacks are graham crackers, melba toast, soda crackers, bagels (no egg), English muffins, fruits, and vegetables. These snacks are preferable to snack crackers, [place] fries, TORTILLA CHIPS, and POTATO chips. Popcorn should be air-popped or cooked in small amounts of liquid vegetable oil. Desserts Eat fruit, low-fat yogurt, and fruit ices instead of pastries, cake, and cookies. Sherbet, angel food cake, gelatin dessert, frozen low-fat yogurt, or other frozen products that do not contain saturated fat (pure fruit juice bars, frozen ice pops) are also acceptable.  COOKING METHODS Choose those methods that use little or no fat. They include: Poaching.  Braising.  Steaming.  Grilling.  Baking.  Stir-frying.  Broiling.  Microwaving.  Foods can be cooked in a nonstick pan without added fat, or use a nonfat cooking spray in regular cookware. Limit fried foods and avoid frying in saturated fat. Add moisture to lean meats by using water, broth, cooking wines, and other nonfat or low-fat sauces along with the cooking methods mentioned above. Soups and stews should be chilled after cooking. The fat that forms on top after a few hours in the refrigerator should be skimmed off. When preparing meals, avoid using excess salt. Salt can contribute to raising blood pressure in some people.  EATING AWAY FROM  HOME Order entres, potatoes, and vegetables without sauces or butter. When meat exceeds the size of a deck of cards (3 to 4 ounces), the rest can be taken home for another meal. Choose vegetable or fruit salads and ask for low-calorie salad dressings to be served on the side. Use dressings sparingly. Limit high-fat toppings, such as bacon, crumbled eggs, cheese, sunflower seeds, and olives. Ask for heart-healthy tub margarine instead of butter.

## 2014-11-22 NOTE — Progress Notes (Signed)
Subjective:    Patient ID: Kathleen Reynolds, female    DOB: 01/09/1961, 54 y.o.   MRN: [id number] Kathleen Nakayama, MD  HPI WEIGHT UP 6 LBS SINCE APR 2016. BMs: BID-NL WITH METFORMIN. EATING FIBER. HEARTBURN: NONE  PT DENIES FEVER, CHILLS, HEMATOCHEZIA, nausea, vomiting, melena, diarrhea, CHEST PAIN, SHORTNESS OF BREATH, CHANGE IN BOWEL IN HABITS, constipation, abdominal pain, problems swallowing, OR heartburn or indigestion.  Past Medical History  Diagnosis Date  . Migraines   . Breast mass in female     right  . Arthritis   . Hyperlipidemia   . Breast cancer ([place])     Right  . Left rotator cuff tear   . Morbid obesity (Kathleen Reynolds)   . Anemia LIFELONG    HEAVY MENSES MAR 2012 HB 13 MCV 86.7 FERRITIN  66  . 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 RUPTURE ROTATOR CUFF 06/08/2007    Qualifier: Diagnosis of  By: Aline Brochure MD, Dorothyann Peng     Past Surgical History  Procedure Laterality Date  . Dilation and curettage of uterus    . Colonoscopy  2004 DR. SMITH    No polyp, hemorrhoids  . Colonoscopy  06/09/10    SNK:[id number] adenomas   . Hystersonogram  2009  . Dilation and curettage of uterus  05/2002  . Mastectomy  1999    left breast cancer   . Breast surgery  11/1997    Left breast cancer-mastectomy  . Breast biopsy  02/13/2011    Procedure: BREAST BIOPSY WITH NEEDLE LOCALIZATION;  Surgeon: Adin Hector, MD;  Location: [facility];  Service: General;  Laterality: Right;  . Breast surgery  2/13-    rt axilly bx  . Portacath placement  03/27/2011    Procedure: INSERTION PORT-A-CATH;  Surgeon: Adin Hector, MD;  Location: [facility];  Service: General;  Laterality: Left;  insert port a cath  . Port-a-cath removal  10/20/2011    Procedure: MINOR REMOVAL PORT-A-CATH;  Surgeon: Adin Hector, MD;  Location: [facility];  Service: General;  Laterality: N/A;  Porta-cath removal  left   Allergies  Allergen Reactions  . Ibuprofen Itching   Current  Outpatient Prescriptions  Medication Sig Dispense Refill  . butalbital-acetaminophen-caffeine (FIORICET, ESGIC) 50-325-40 MG per tablet Take 1 tablet by mouth 2 (two) times daily as needed. For migraine    . calcium-vitamin D (OSCAL WITH D) 500-200 MG-UNIT per tablet Take 1 tablet by mouth.    . Flaxseed, Linseed, (FLAXSEED OIL) 1000 MG CAPS Take 2,000 mg by mouth daily.     . meclizine (ANTIVERT) 25 MG tablet Take 1 tablet (25 mg total) by mouth 3 (three) times daily as needed. For dizziness    . metFORMIN (GLUCOPHAGE) 500 MG tablet Take 1 tablet (500 mg total) by mouth 2 (two) times daily with a meal.    . Multiple Vitamin (MULITIVITAMIN WITH MINERALS) TABS Take 1 tablet by mouth daily.    .      .      .      .      .       Review of Systems PER HPI OTHERWISE ALL SYSTEMS ARE NEGATIVE.    Objective:   Physical Exam  Constitutional: She is oriented to person, place, and time. She appears well-developed and well-nourished. No distress.  HENT:  Head: Normocephalic and atraumatic.  Mouth/Throat: Oropharynx is clear and moist. No oropharyngeal exudate.  Eyes: Pupils are equal, round, and reactive to  light. No scleral icterus.  Neck: Normal range of motion. Neck supple.  Cardiovascular: Normal rate, regular rhythm and normal heart sounds.   Pulmonary/Chest: Effort normal and breath sounds normal. No respiratory distress.  Abdominal: Soft. Bowel sounds are normal. She exhibits no distension. There is no tenderness.  Musculoskeletal: She exhibits no edema.  Lymphadenopathy:    She has no cervical adenopathy.  Neurological: She is alert and oriented to person, place, and time.  NO FOCAL DEFICITS   Psychiatric: She has a normal mood and affect.  Vitals reviewed.         Assessment & Plan:

## 2014-11-22 NOTE — Progress Notes (Signed)
cc'ed to pcp °

## 2014-12-10 ENCOUNTER — Ambulatory Visit (HOSPITAL_COMMUNITY)
Admission: RE | Admit: 2014-12-10 | Discharge: 2014-12-10 | Disposition: A | Discharge status: Home / Self Care | Payer: 59 | Source: Ambulatory Visit | Attending: Hematology & Oncology | Admitting: Hematology & Oncology

## 2014-12-10 ENCOUNTER — Other Ambulatory Visit (HOSPITAL_COMMUNITY): Payer: Self-pay | Admitting: Hematology & Oncology

## 2014-12-10 ENCOUNTER — Ambulatory Visit (HOSPITAL_COMMUNITY): Payer: Self-pay

## 2014-12-10 DIAGNOSIS — Z1231 Encounter for screening mammogram for malignant neoplasm of breast: Secondary | ICD-10-CM | POA: Insufficient documentation

## 2014-12-10 DIAGNOSIS — Z853 Personal history of malignant neoplasm of breast: Secondary | ICD-10-CM | POA: Insufficient documentation

## 2014-12-10 DIAGNOSIS — Z9012 Acquired absence of left breast and nipple: Secondary | ICD-10-CM | POA: Insufficient documentation

## 2014-12-12 ENCOUNTER — Ambulatory Visit (HOSPITAL_COMMUNITY): Payer: 59

## 2014-12-19 ENCOUNTER — Other Ambulatory Visit (HOSPITAL_COMMUNITY): Payer: Self-pay | Admitting: Hematology & Oncology

## 2014-12-19 DIAGNOSIS — C50911 Malignant neoplasm of unspecified site of right female breast: Secondary | ICD-10-CM

## 2015-01-01 ENCOUNTER — Other Ambulatory Visit (HOSPITAL_COMMUNITY): Payer: Self-pay | Admitting: Hematology & Oncology

## 2015-01-01 ENCOUNTER — Ambulatory Visit (HOSPITAL_COMMUNITY)
Admission: RE | Admit: 2015-01-01 | Discharge: 2015-01-01 | Disposition: A | Discharge status: Home / Self Care | Payer: 59 | Source: Ambulatory Visit | Attending: Hematology & Oncology | Admitting: Hematology & Oncology

## 2015-01-01 DIAGNOSIS — Z9012 Acquired absence of left breast and nipple: Secondary | ICD-10-CM | POA: Diagnosis not present

## 2015-01-01 DIAGNOSIS — Z1231 Encounter for screening mammogram for malignant neoplasm of breast: Secondary | ICD-10-CM

## 2015-01-01 DIAGNOSIS — Z9221 Personal history of antineoplastic chemotherapy: Secondary | ICD-10-CM | POA: Insufficient documentation

## 2015-01-01 DIAGNOSIS — C50911 Malignant neoplasm of unspecified site of right female breast: Secondary | ICD-10-CM | POA: Insufficient documentation

## 2015-01-01 DIAGNOSIS — Z923 Personal history of irradiation: Secondary | ICD-10-CM | POA: Insufficient documentation

## 2015-01-31 ENCOUNTER — Ambulatory Visit (HOSPITAL_COMMUNITY): Payer: Self-pay | Admitting: Hematology & Oncology

## 2015-02-05 ENCOUNTER — Ambulatory Visit (INDEPENDENT_AMBULATORY_CARE_PROVIDER_SITE_OTHER): Payer: BLUE CROSS/BLUE SHIELD | Admitting: Family Medicine

## 2015-02-05 ENCOUNTER — Encounter: Payer: Self-pay | Admitting: Family Medicine

## 2015-02-05 VITALS — BP 120/76 | HR 88 | Resp 16 | Ht 67.0 in | Wt 297.0 lb

## 2015-02-05 DIAGNOSIS — T7840XD Allergy, unspecified, subsequent encounter: Secondary | ICD-10-CM

## 2015-02-05 DIAGNOSIS — R51 Headache: Secondary | ICD-10-CM

## 2015-02-05 DIAGNOSIS — T7840XA Allergy, unspecified, initial encounter: Secondary | ICD-10-CM | POA: Insufficient documentation

## 2015-02-05 DIAGNOSIS — Z114 Encounter for screening for human immunodeficiency virus [HIV]: Secondary | ICD-10-CM

## 2015-02-05 DIAGNOSIS — R7303 Prediabetes: Secondary | ICD-10-CM | POA: Diagnosis not present

## 2015-02-05 DIAGNOSIS — E785 Hyperlipidemia, unspecified: Secondary | ICD-10-CM | POA: Diagnosis not present

## 2015-02-05 DIAGNOSIS — M170 Bilateral primary osteoarthritis of knee: Secondary | ICD-10-CM

## 2015-02-05 DIAGNOSIS — Z23 Encounter for immunization: Secondary | ICD-10-CM

## 2015-02-05 DIAGNOSIS — Z1159 Encounter for screening for other viral diseases: Secondary | ICD-10-CM

## 2015-02-05 DIAGNOSIS — J3089 Other allergic rhinitis: Secondary | ICD-10-CM

## 2015-02-05 DIAGNOSIS — E8881 Metabolic syndrome: Secondary | ICD-10-CM | POA: Diagnosis not present

## 2015-02-05 DIAGNOSIS — R519 Headache, unspecified: Secondary | ICD-10-CM

## 2015-02-05 MED ORDER — BUTALBITAL-APAP-CAFFEINE 50-325-40 MG PO TABS: 1.0000 | ORAL_TABLET | Freq: Two times a day (BID) | ORAL | Status: DC | PRN

## 2015-02-05 MED ORDER — EPINEPHRINE 0.3 MG/0.3ML IJ SOAJ
0.3000 mg | Freq: Once | INTRAMUSCULAR | Status: DC
Start: 2015-02-05 — End: 2015-03-19

## 2015-02-05 MED ORDER — MECLIZINE HCL 25 MG PO TABS: 25.0000 mg | ORAL_TABLET | Freq: Three times a day (TID) | ORAL | Status: DC | PRN

## 2015-02-05 MED ORDER — METFORMIN HCL 500 MG PO TABS: 500.0000 mg | ORAL_TABLET | Freq: Two times a day (BID) | ORAL | Status: DC

## 2015-02-05 NOTE — Assessment & Plan Note (Signed)
Controlled, no change in medication  

## 2015-02-05 NOTE — Assessment & Plan Note (Signed)
Intermittent , with recent episode, responds to fioricet, non focal neuro exam , med refilled, Needs to control allergy symptoms also and commit to daily flonase

## 2015-02-05 NOTE — Progress Notes (Signed)
Subjective:    Patient ID: Kathleen Reynolds, female    DOB: Nov 30, 1960, 55 y.o.   MRN: [id number]  HPI Two epsisode of severe allergic reaction with lip swelling initially around 12/19, then again  then on 12/28 required hospitalization due to difficulty brreathing , she had been using ibuprofen OTC ,( called alleve,) unfortunately with established h/o allergy to ibuprofen. States it was suggested she  See an allergist due to symptom severity I will refer for testing Still struggling with her weight , re energized to change her approach and hold lerself more accountable  Review of Systems See HPI Denies recent fever or chills. Denies sinus pressure, ear pain or sore throat.c/o excessive nasal drainage which is clear Denies chest congestion, productive cough or wheezing. Denies chest pains, palpitations and leg swelling Denies abdominal pain, nausea, vomiting,diarrhea or constipation.   Denies dysuria, frequency, hesitancy or incontinence. C/o increased bilateral knee pain and stiffness and limitation in mobility. Denies headaches, seizures, numbness, or tingling. Denies depression, anxiety or insomnia. Denies skin break down or rash.        Objective:   Physical Exam BP 120/76 mmHg  Pulse 88  Resp 16  Ht 5\' 7"  (1.702 m)  Wt 297 lb (134.718 kg)  BMI 46.51 kg/m2  SpO2 96%  LMP 03/27/2011 Patient alert and oriented and in no cardiopulmonary distress.  HEENT: No facial asymmetry, EOMI,   oropharynx pink and moist.  Neck supple no JVD, no mass. No airway oropharyngeal obstruction, erythema and edema of nasal mucosa Chest: Clear to auscultation bilaterally.No wheezes  CVS: S1, S2 no murmurs, no S3.Regular rate.  ABD: Soft non tender.   Ext: No edema  MS: Adequate ROM spine, shoulders, hips and  Reduced in knees with stiffness , deformity and crepitus  Skin: Intact, no ulcerations or rash noted.  Psych: Good eye contact, normal affect. Memory intact not anxious or  depressed appearing.  CNS: CN 2-12 intact, power,  normal throughout.no focal deficits noted.        Assessment & Plan:  Allergic reaction Severe allergic reaction, presumably to ibuprofen, requiring hospitalization last week. Educated re the need to avoid NSAIDS, only use tylenol Epi pen prescribed, and refer to allergist  Allergic rhinitis Controlled, no change in medication   OA (osteoarthritis) of knee deteriorating with weight gain and morbid obesity. Cool compress, water exercise and biking and weights, and weight loss Tylenol for pain  Headache Intermittent , with recent episode, responds to fioricet, non focal neuro exam , med refilled, Needs to control allergy symptoms also and commit to daily flonase  Obesity, morbid, BMI 40.0-49.9 (HCC) Unchanged. Patient re-educated about  the importance of commitment to a  minimum of 150 minutes of exercise per week.  The importance of healthy food choices with portion control discussed. Encouraged to start a food diary, count calories and to consider  joining a support group. Sample diet sheets offered. Goals set by the patient for the next several months.   Weight /BMI 02/05/2015 11/22/2014 08/27/2014  WEIGHT 297 lb 299 lb 6.4 oz 296 lb  HEIGHT 5\' 7"  5\' 7"  5\' 5"   BMI 46.51 kg/m2 46.88 kg/m2 49.26 kg/m2    Current exercise per week 60 minutes.   Prediabetes Patient educated about the importance of limiting  Carbohydrate intake , the need to commit to daily physical activity for a minimum of 30 minutes , and to commit weight loss. The fact that changes in all these areas will reduce or eliminate all together  the development of diabetes is stressed.   Updated lab needed at/ before next visit.   Diabetic Labs Latest Ref Rng 08/01/2014 07/31/2014 05/01/2014 12/18/2013 10/10/2013  HbA1c <5.7 % - 6.1(H) - 5.9(H) -  Chol 0 - 200 mg/dL - 211(H) - 201(H) -  HDL >=46 mg/dL - 63 - 57 -  Calc LDL 0 - 99 mg/dL - 121(H) - 116(H) -    Triglycerides <150 mg/dL - 137 - 138 -  Creatinine 0.44 - 1.00 mg/dL 0.69 - 0.65 0.75 0.66   BP/Weight 02/05/2015 11/22/2014 08/27/2014 08/01/2014 08/01/2014 [id number]  Systolic BP 123456 [id number] 123456 [id number]  Diastolic BP 76 70 80 78 73 80 74  Wt. (Lbs) 297 299.4 296 295 294.9 293 293.2  BMI 46.51 46.88 49.26 49.09 49.07 48.76 50.3   No flowsheet data found.

## 2015-02-05 NOTE — Assessment & Plan Note (Signed)
Patient educated about the importance of limiting  Carbohydrate intake , the need to commit to daily physical activity for a minimum of 30 minutes , and to commit weight loss. The fact that changes in all these areas will reduce or eliminate all together the development of diabetes is stressed.   Updated lab needed at/ before next visit.   Diabetic Labs Latest Ref Rng 08/01/2014 07/31/2014 05/01/2014 12/18/2013 10/10/2013  HbA1c <5.7 % - 6.1(H) - 5.9(H) -  Chol 0 - 200 mg/dL - 211(H) - 201(H) -  HDL >=46 mg/dL - 63 - 57 -  Calc LDL 0 - 99 mg/dL - 121(H) - 116(H) -  Triglycerides <150 mg/dL - 137 - 138 -  Creatinine 0.44 - 1.00 mg/dL 0.69 - 0.65 0.75 0.66   BP/Weight 02/05/2015 11/22/2014 08/27/2014 08/01/2014 08/01/2014 [id number]  Systolic BP 123456 [id number] 123456 [id number]  Diastolic BP 76 70 80 78 73 80 74  Wt. (Lbs) 297 299.4 296 295 294.9 293 293.2  BMI 46.51 46.88 49.26 49.09 49.07 48.76 50.3   No flowsheet data found.

## 2015-02-05 NOTE — Patient Instructions (Addendum)
F/u in 3 month, call if you need me sooner  Weight loss  Goal of 5 pounds per month with CHANGE in diet.  Food diary very useful and you are referred to nutritionist  You are referred to allergist and epi pen prescribed due to severity of recent allergic reaction which necessitated hospitalization  NOTHING FOR PAIN EXCEPT TYLENOL,   Start sudafed, OTC one daily for next 3 to 5 days to reduce nasal drainage  Flu vaccine today Fasting lipid and HBA1C and TSH in 3 month  Thanks for choosing  Primary Care, we consider it a privelige to serve you. All the best fopr 2017!  We will work together for improved health  Tylenol and cool compress/ ice with weight loss will help knee pain immensely

## 2015-02-05 NOTE — Assessment & Plan Note (Signed)
Unchanged. Patient re-educated about  the importance of commitment to a  minimum of 150 minutes of exercise per week.  The importance of healthy food choices with portion control discussed. Encouraged to start a food diary, count calories and to consider  joining a support group. Sample diet sheets offered. Goals set by the patient for the next several months.   Weight /BMI 02/05/2015 11/22/2014 08/27/2014  WEIGHT 297 lb 299 lb 6.4 oz 296 lb  HEIGHT 5\' 7"  5\' 7"  5\' 5"   BMI 46.51 kg/m2 46.88 kg/m2 49.26 kg/m2    Current exercise per week 60 minutes.

## 2015-02-05 NOTE — Assessment & Plan Note (Signed)
Severe allergic reaction, presumably to ibuprofen, requiring hospitalization last week. Educated re the need to avoid NSAIDS, only use tylenol Epi pen prescribed, and refer to allergist

## 2015-02-05 NOTE — Assessment & Plan Note (Signed)
deteriorating with weight gain and morbid obesity. Cool compress, water exercise and biking and weights, and weight loss Tylenol for pain

## 2015-02-07 ENCOUNTER — Ambulatory Visit: Payer: Self-pay | Admitting: Family Medicine

## 2015-02-20 ENCOUNTER — Encounter: Payer: Self-pay | Admitting: Nutrition

## 2015-02-20 ENCOUNTER — Encounter (HOSPITAL_COMMUNITY): Payer: BLUE CROSS/BLUE SHIELD | Attending: Hematology & Oncology | Admitting: Hematology & Oncology

## 2015-02-20 ENCOUNTER — Encounter: Payer: BLUE CROSS/BLUE SHIELD | Attending: Family Medicine | Admitting: Nutrition

## 2015-02-20 ENCOUNTER — Encounter (HOSPITAL_COMMUNITY): Payer: Self-pay | Admitting: Hematology & Oncology

## 2015-02-20 VITALS — Ht 67.0 in | Wt 298.6 lb

## 2015-02-20 VITALS — BP 120/74 | HR 78 | Temp 97.9°F | Resp 18 | Wt 299.0 lb

## 2015-02-20 DIAGNOSIS — Z853 Personal history of malignant neoplasm of breast: Secondary | ICD-10-CM

## 2015-02-20 DIAGNOSIS — C50911 Malignant neoplasm of unspecified site of right female breast: Secondary | ICD-10-CM | POA: Diagnosis not present

## 2015-02-20 DIAGNOSIS — Z171 Estrogen receptor negative status [ER-]: Secondary | ICD-10-CM

## 2015-02-20 DIAGNOSIS — Z1501 Genetic susceptibility to malignant neoplasm of breast: Secondary | ICD-10-CM

## 2015-02-20 DIAGNOSIS — Z1509 Genetic susceptibility to other malignant neoplasm: Principal | ICD-10-CM

## 2015-02-20 DIAGNOSIS — C50912 Malignant neoplasm of unspecified site of left female breast: Secondary | ICD-10-CM

## 2015-02-20 DIAGNOSIS — R739 Hyperglycemia, unspecified: Secondary | ICD-10-CM

## 2015-02-20 NOTE — Patient Instructions (Addendum)
[place] at [facility] Discharge Instructions  RECOMMENDATIONS MADE BY THE CONSULTANT AND ANY TEST RESULTS WILL BE SENT TO YOUR REFERRING PHYSICIAN.   Exam and discussion completed by Dr Whitney Muse today All the MRI and mammogram just help detect it early. Mastectomy is the only was to help prevent it from coming back MRI to be scheduled in April, these will be yearly Next time you are here we will do a clinical breast exam  Return to see the doctor in 6 months Please call the clinic if you have any questions or concerns     Thank you for choosing [place] at [facility] to provide your oncology and hematology care.  To afford each patient quality time with our provider, please arrive at least 15 minutes before your scheduled appointment time.    You need to re-schedule your appointment should you arrive 10 or more minutes late.  We strive to give you quality time with our providers, and arriving late affects you and other patients whose appointments are after yours.  Also, if you no show three or more times for appointments you may be dismissed from the clinic at the providers discretion.     Again, thank you for choosing [facility].  Our hope is that these requests will decrease the amount of time that you wait before being seen by our physicians.       _____________________________________________________________  Should you have questions after your visit to [facility], please contact our office at [phone] between the hours of 8:30 a.m. and 4:30 p.m.  Voicemails left after 4:30 p.m. will not be returned until the following business day.  For prescription refill requests, have your pharmacy contact our office.

## 2015-02-20 NOTE — Progress Notes (Signed)
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 Fanny Skates on 02/13/2011 followed by sentinel node biopsy which was negative. Her Ki-67 marker was high at 54% HER-2/neu was nonamplified and she was ER/PR negative. She is now status post 6 cycles of carboplatin and Taxotere and S/P radiation. No evidence of recurrence.  #2.Left-sided breast cancer stage II grade 3 triple negative at that time as well with 7 negative nodes but a 2.3 cm primary with mastectomy on 11/27/1997. She was treated with AC. x4 cycles followed by Taxol x4 cycles thus far without recurrence.  #3. BRCA1 positivity.   CURRENT THERAPY:Observation and surveillance   INTERVAL HISTORY: Kathleen Reynolds 55 y.o. 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 Fanny Skates on 02/13/2011 followed by sentinel node biopsy which was negative. Her Ki-67 marker was high at 54% HER-2/neu was nonamplified and she was ER/PR negative. She is now status post 6 cycles of carboplatin and Taxotere (04/03/11- 07/16/11) and S/P radiation finishing in August 2013. AND History of left-sided breast cancer stage II grade 3 triple negative  with 7 negative nodes but a 2.3 cm primary with mastectomy on 11/27/1997. She was treated with AC. x4 cycles followed by Taxol x4 cycles thus far without recurrence.  She has discussed with Dr. Garwin Brothers (Gynecologist) an oophorectomy due to her BRCA 1 mutation but states that has not been pursued. She notes that she was encouraged to loose weight but has not been able to. He has tried Weight Watchers and states it was too expensive. She wants  to lose weight on her own. She finds increasing her activity level is difficult because of her work schedule.  Kathleen Reynolds returns to the [place] alone today. She had her mammogram on November 16th.  She says everything is going "okay I guess, I'm still here," and confirms that she had a good Christmas. She denies any pressing questions in general.  When asked if she's worried about anything, she mentions an allergic reaction two weeks ago and had to go to the ER. She took some advil and multivitamins that morning, ate an apple on the way, 3 peanut butter & chocolate cookies, and lasagna from the night before. She says she started itching, her tongue was swelling, her throat was swelling, she got hives. [facility] went to [facility] and the pharmacy gave her benadryl and called 911 because she couldn't breathe. She ended up at [facility] and was admitted overnight.   The reason she suspects advil is because of the fact that, several years ago, she took advil and started to itch, and "nothing would calm it down except the benadryl." She is going to see an allergist next month to see what's going on. She doesn't think it's peanut butter, since she's eaten some since and hasn't had a reaction. Kathleen Reynolds suspects that her allergy is ibuprofen related.  She reports that she's been taking alleve for the past 2 years, but that her PCP told her that she should take neither alleve nor advil at all anymore.  Other than this, she confirms that her bowels are okay, denies any bleeding,  says that her breathing is pretty good, and that she is working "too much," but that "you do what you have to do." She says she was swelling in her hands and that it used to be bad in her legs, but after she had her episode two weeks ago, everything went back down to normal.   She denies any unusual lumps or bumps anywhere and confirms that her appetite is good.  While she is ambulating to the exam table, she says "I have a jammed up  knee," and says that her PCP attributes this to arthritis. During the physical exam, she comments "I swell every once in a while right here," and gestures to the right side of her neck. She says she suspects it has something to do with sinus, and that it occurred after she had her allergic episode.  She denies any chest pain.   Past Medical History  Diagnosis Date  . Migraines   . Breast mass in female     right  . Arthritis   . Hyperlipidemia   . Breast cancer ([place])     Right  . Left rotator cuff tear   . Morbid obesity ([place])   . Anemia [place]    HEAVY MENSES MAR 2012 HB 13 MCV 86.7 FERRITIN  66  . 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 RUPTURE ROTATOR CUFF 06/08/2007    Qualifier: Diagnosis of  By: Aline Brochure MD, Dorothyann Peng      has Thyroid nodule; Dyslipidemia; Headache; Prediabetes; Invasive ductal carcinoma of right breast, stage 1; BRCA1 positive; OA (osteoarthritis) of knee; Anemia; Obesity, morbid, BMI 40.0-49.9 ([place]); Metabolic syndrome X; Achilles tendonitis, bilateral; Rotator cuff syndrome; Depression with anxiety; Nonspecific elevation of levels of transaminase or lactic acid dehydrogenase (LDH); Vertigo, intermittent; Allergic rhinitis; Sciatica of right side associated with disorder of lumbar spine; and Allergic reaction on her problem list.     is allergic to nsaids and ibuprofen.  Kathleen Reynolds had no medications administered during this visit.  Past Surgical History  Procedure Laterality Date  . Dilation and curettage of uterus    . Colonoscopy  2004 DR. SMITH    No polyp, hemorrhoids  . Colonoscopy  06/09/10    HCW:CBJSEG adenomas   . Hystersonogram  2009  . Dilation and curettage of uterus  05/2002  . Mastectomy  1999    left breast cancer   . Breast surgery  11/1997    Left breast cancer-mastectomy  . Breast biopsy  02/13/2011    Procedure: BREAST BIOPSY WITH NEEDLE LOCALIZATION;  Surgeon: Adin Hector, MD;  Location: [place];  Service: General;   Laterality: Right;  . Breast surgery  2/13-    rt axilly bx  . Portacath placement  03/27/2011    Procedure: INSERTION PORT-A-CATH;  Surgeon: Adin Hector, MD;  Location: [place];  Service: General;  Laterality: Left;  insert port a cath  . Port-a-cath removal  10/20/2011    Procedure: MINOR REMOVAL PORT-A-CATH;  Surgeon: Adin Hector, MD;  Location: [place];  Service: General;  Laterality: N/A;  Porta-cath removal  left    Denies any headaches, dizziness, double vision, fevers, chills, night sweats, nausea, vomiting, diarrhea, constipation, chest pain, heart palpitations, shortness of breath, blood in stool, black tarry stool, urinary pain, urinary burning, urinary frequency, hematuria.  14 point review of systems was performed and is negative except as detailed under history of present illness and above    PHYSICAL EXAMINATION  ECOG PERFORMANCE STATUS: 0 - Asymptomatic  Filed Vitals:   02/20/15 0955  BP: 120/74  Pulse: 78  Temp: 97.9 F (36.6 C)  Resp: 18    GENERAL:alert, no distress, well nourished, well developed, comfortable, cooperative, morbidly obese and smiling  SKIN: skin color, texture, turgor are normal, no rashes or significant lesions  HEAD: Normocephalic, No masses, lesions, tenderness or abnormalities  EYES: normal, Conjunctiva are pink and non-injected  EARS: External ears normal  OROPHARYNX:lips, buccal mucosa, and tongue normal and mucous membranes are moist  NECK: supple, no adenopathy, thyroid normal size, non-tender, without nodularity, no stridor, non-tender, trachea midline  LYMPH: no palpable lymphadenopathy, no hepatosplenomegaly  BREAST:right breast normal without mass, skin or nipple changes or axillary nodes, hyperpigmentation and erythema secondary to radiation with a small fibrotic area in the 7-8 o'clock position. (patient is large breasted)  Left post-mastectomy site well healed and free of suspicious  changes. LUNGS: clear to auscultation and percussion  HEART: regular rate & rhythm, no murmurs, no gallops, S1 normal and S2 normal  ABDOMEN:abdomen soft, non-tender, morbidly obese and normal bowel sounds  BACK: Back symmetric, no curvature., No CVA tenderness  EXTREMITIES:less then 2 second capillary refill, no joint deformities, effusion, or inflammation, no edema, no skin discoloration, no clubbing, no cyanosis  NEURO: alert & oriented x 3 with fluent speech, no focal motor/sensory deficits, gait normal   LABORATORY DATA: I have reviewed the data as listed.  CBC    Component Value Date/Time   WBC 4.8 08/01/2014 1256   RBC 4.10 08/01/2014 1256   HGB 13.0 08/01/2014 1256   HCT 37.7 08/01/2014 1256   PLT 210 08/01/2014 1256   MCV 92.0 08/01/2014 1256   MCH 31.7 08/01/2014 1256   MCHC 34.5 08/01/2014 1256   RDW 14.8 08/01/2014 1256   LYMPHSABS 1.6 05/01/2014 1050   MONOABS 0.3 05/01/2014 1050   EOSABS 0.1 05/01/2014 1050   BASOSABS 0.0 05/01/2014 1050      Chemistry      Component Value Date/Time   NA 137 08/01/2014 1256   K 3.8 08/01/2014 1256   CL 103 08/01/2014 1256   CO2 25 08/01/2014 1256   BUN 15 08/01/2014 1256   CREATININE 0.69 08/01/2014 1256   CREATININE 0.75 12/18/2013 0741      Component Value Date/Time   CALCIUM 8.9 08/01/2014 1256   ALKPHOS 63 08/01/2014 1256   AST 22 08/01/2014 1256   ALT 27 08/01/2014 1256   BILITOT 0.6 08/01/2014 1256     RADIOLOGY  No results found.  Study Result     CLINICAL DATA: The patient has a history of a previous left mastectomy for malignancy in 2000. There is a history of right breast lumpectomy followed by radiation therapy and chemotherapy in 2013. Follow-up evaluation.  EXAM: DIGITAL DIAGNOSTIC RIGHT MAMMOGRAM WITH 3D TOMOSYNTHESIS AND CAD  COMPARISON: Previous examinations including 12/10/2014, 12/22/2013, 12/21/2012, 12/16/2011.  ACR Breast Density Category a: The breast tissue is almost  entirely fatty.  FINDINGS: There are stable post lumpectomy scarring changes located posteriorly and superiorly within the right breast. The right breast parenchymal pattern is stable. There is no specific evidence for recurrent tumor or developing malignancy.  Mammographic images were processed with CAD.  IMPRESSION: Stable right breast parenchymal pattern and no findings worrisome for recurrent tumor or developing malignancy.  RECOMMENDATION: Right breast diagnostic mammography in 1 year.  I have discussed the findings and recommendations with the patient. Results were also provided in writing at the conclusion  of the visit. If applicable, a reminder letter will be sent to the patient regarding the next appointment.  BI-RADS CATEGORY 1: Negative.   Electronically Signed  By: Altamese Cabal M.D.  On: 01/01/2015 15:08    ASSESSMENT:  1. 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 Fanny Skates on 02/13/2011 followed by sentinel node biopsy which was negative. Her Ki-67 marker was high at 54% HER-2/neu was nonamplified and she was ER/PR negative. She is now status post 6 cycles of carboplatin and Taxotere and S/P radiation. No evidence of recurrence.  2. BRCA1 positivity.  3. History of left-sided breast cancer stage II grade 3 triple negative at that time as well with 7 negative nodes but a 2.3 cm primary with mastectomy on 11/27/1997. She was treated with AC. x4 cycles followed by Taxol x4 cycles thus far without recurrence.   PLAN:   I spent a significant portion of her visit today discussing BRCA 1 mutation and the risk of breast and ovarian cancer. I put this in perspective for her as she stated she did not have a good understanding of her risk. We also talked about options including ongoing mammography and breast MRI versus mastectomy with reconstruction. We discussed the risks and benefits  associated with both. She and I discussed these issues at her last visit but I feel she still has a poor understanding of her risks.  We discussed different options for weight loss and again she is adamant she will do this on her own. I would like for her to see her gynecologist again for consideration of oophorectomy.  I would like to see her back in 6 months for ongoing follow-up, we will also continue to discuss all of the above.  If she has problems or concerns prior to follow-up she is to let us know.   I will follow the NCCN guidelines recommendations for increased risk breast cancer patient due to her BRCA 1 positivity and history of 2 breast cancers at the age of 72.  Will perform yearly mammogram and MRI breast alternating so a screening test will be performed every 6 months.  She has a breast MRI coming up in March.   In general, she worries about affording her medical payments.  I will follow-up with her in 6 months. During her next appointment, she will need a breast exam.   All questions were answered. The patient knows to call the clinic with any problems, questions or concerns. We can certainly see the patient much sooner if necessary.   This document serves as a record of services personally performed by Ancil Linsey, MD. It was created on her behalf by Toni Amend, a trained medical scribe. The creation of this record is based on the scribe's personal observations and the provider's statements to them. This document has been checked and approved by the attending provider.  I have reviewed the above documentation for accuracy and completeness, and I agree with the above.  This note was signed electronically.  Molli Hazard, MD

## 2015-02-20 NOTE — Patient Instructions (Signed)
Goals: 1. Follow My Plate Method 2. Increase fresh fruits and low carb vegetables. 3. Drink 5 bottles of water per day. 4. Cut out snacks between meals. 5. Follow low salt high fiber low fat diet. 6. Eat 2- 3 carb choices per meal. 7. Lose 1-2 lbs per week til next visit.. 8. Get A1C less than 5.7% in three months. 9. Keep a food journal and bring at next visit.

## 2015-02-20 NOTE — Progress Notes (Signed)
  Medical Nutrition Therapy:  Appt start time: 1400  end time:  1500.  Assessment:  Primary concerns today: Prediabetes, obesity. Lives with her husband. Both do cooking and shoppping. She admits to frying most foods. Cooks and eats a lot of meals out. Most recent  A1C 6.1%.  Not testing her blood sugars. Metformin 500 mg BID. Complains of knee and back pain. She notes she is ready to make lifestyle and behavior changes to improve her health and lose weight. PMH: Hyperlipemia. TCHOL 211 and LDL >150 mg/dl.  Physical activity: ADL. Use to like to walk but hasn't recently. She works as a Quarry manager.. Typically eats 2-3 meals per day. Diet is excessive in calories, insistent in carbs and low in fresh fruits and low carb vegetables. Likes to drink milk.  Preferred Learning Style:   No preference indicated   Learning Readiness:  Ready  Change in progress   MEDICATIONS: See list   DIETARY INTAKE:  24-hr recall:  B ( AM): Oatmeal with cinnamon, or apple or banaa, water,  Snk ( AM): fruit, cheese or nuts L ( PM): [place] cheese sandwich and rotissie chicken, water Snk ( PM): apple, or carrots D ( PM): Steak,  water Snk ( PM): none: sometimes popcorn Beverages: water, milk, wine occasionally.  Usual physical activity: ADL  Estimated energy needs: 1500 calories 170 g carbohydrates 112 g protein 42 g fat  Progress Towards Goal(s):  In progress.   Nutritional Diagnosis:  NB-1.1 Food and nutrition-related knowledge deficit As related to Obesity and Prediabetse.  As evidenced by BMI>40 and A1C 6.1%..    Intervention: Nutrition and Diabetes education provided on My Plate, CHO counting, meal planning, portion sizes, timing of meals, avoiding snacks between meals unless having a low blood sugar, target ranges for A1C and blood sugars, signs/symptoms and treatment of hyper/hypoglycemia, monitoring blood sugars, taking medications as prescribed, benefits of exercising 30 minutes per day and prevention  of complications of DM.  Goals: 1. Follow My Plate Method 2. Increase fresh fruits and low carb vegetables. 3. Drink 5 bottles of water per day. 4. Cut out snacks between meals. 5. Follow low salt high fiber low fat diet. 6. Eat 2- 3 carb choices per meal. 7. Lose 1-2 lbs per week til next visit.. 8. Get A1C less than 5.7% in three months. 9. Keep a food journal and bring at next visit.   Teaching Method Utilized:  Visual Auditory Hands on  Handouts given during visit include: The Plate Method Meal Plan Card Diabetes Instructions  Barriers to learning/adherence to lifestyle change: None  Demonstrated degree of understanding via:  Teach Back   Monitoring/Evaluation:  Dietary intake, exercise, meal planning, food journal, and body weight in 1 month(s). Recommend to start a statin drug, omega three fish oil and a baby Asprin due to CVD risk factors with prediabetes and hyperlipidemia.

## 2015-03-19 ENCOUNTER — Encounter: Payer: Self-pay | Admitting: Allergy and Immunology

## 2015-03-19 ENCOUNTER — Ambulatory Visit (INDEPENDENT_AMBULATORY_CARE_PROVIDER_SITE_OTHER): Payer: BLUE CROSS/BLUE SHIELD | Admitting: Allergy and Immunology

## 2015-03-19 ENCOUNTER — Telehealth: Payer: Self-pay

## 2015-03-19 VITALS — BP 120/78 | HR 82 | Temp 98.0°F | Resp 18 | Ht 63.47 in | Wt 293.0 lb

## 2015-03-19 DIAGNOSIS — J309 Allergic rhinitis, unspecified: Secondary | ICD-10-CM

## 2015-03-19 DIAGNOSIS — H1010 Acute atopic conjunctivitis, unspecified eye: Secondary | ICD-10-CM

## 2015-03-19 DIAGNOSIS — L509 Urticaria, unspecified: Secondary | ICD-10-CM

## 2015-03-19 MED ORDER — EPINEPHRINE 0.3 MG/0.3ML IJ SOAJ: 0.3000 mg | Freq: Once | INTRAMUSCULAR | Status: DC

## 2015-03-19 NOTE — Progress Notes (Signed)
NEW PATIENT NOTE  RE: Kathleen Reynolds MRN: [id number] DOB: 1960-10-09 [facility]. [address] [place], [place] [address] Date of Office Visit: 03/19/2015  Dear Fayrene Helper, MD:  I had the pleasure of seeing Kathleen Reynolds today in initial evaluation as you recall-- Subjective:  Kathleen Reynolds is a 55 y.o. female who presents today for Allergic Reaction and Urticaria  Assessment:   1. Hives (Dec 2016).  2. Allergic rhinoconjunctivitis   3.      Suspected allergic reaction secondary to ibuprofen.  (Dec 2016). Plan:   Meds ordered this encounter  Medications  . EPINEPHrine 0.3 mg/0.3 mL IJ SOAJ injection    Sig: Inject 0.3 mLs (0.3 mg total) into the muscle once.    Dispense:  2 Device    Refill:  1   Patient Instructions  1. Avoidance: Mite and Pollen And ibuprofen (all NSAIDS) for now. 2. Antihistamine: Claritin 10mg  one by mouth once daily for runny nose or itching. 3. Nasal Spray: Flonase 1-2 spray(s) each nostril once daily for stuffy nose or drainage.  4. Selected labs at [facility] 5.  If new episodes of rash/skin changes take picture and document environment exposure/ingestion/activity. 6. Nasal Saline wash each evening at shower time. 7. Epi-pen/Benadryl as needed. 8. Follow up Visit:  2 months or sooner if needed and will obtain records from ED visit and hospitalization details.  HPI: Kathleen Reynolds presents to the office with a mild history of rhinorrhea, congestion, sneezing, and  Itchy watery eyes for more than 5 years, which she feels is greater in the spring and possibly with various outdoor, fluctuant weather pattern exposures and recently animal dander related to work exposures.  However, more recent concern is of hives, with suspected allergic reaction in December.  She reports going to work at her usual time.  Approximately an hour after taking ibuprofen for joint complaints.  She noticed itching, redness, hives and  swelling at her hands.  Shortly thereafter, there was tongue/throat irritation and what seemed to be change her breathing.  As the rash progressed, she went to [facility] to take Benadryl but worsened while in the store and the staff called 911.  She was transported to [facility] ED, per her report received prednisone and Xanax, then was monitored for several hours (notes unavailable today).  They were concerned about her throat complaints, therefore admitted her to the hospital.  There was no vomiting, diarrhea, cough or wheeze nor recollection of treatment with injections or nebulization treatments.  Earlier that morning, she ate left over beef lasagna, after a physician appointment in [place] and stated had the same meal the night before.  She has no restrictions to her diet and eats all foods including pork.  She has no recollection of tick bites or any chronic skin issues or eczema.  For at least 2 years,  she has used Aleve for knee pain and only switched to ibuprofen one month prior to her skin difficulty.  She uses various soaps, lotions, detergents without issue or concern and has had no further skin difficulties since the December 28th episode.  She has made no modifications in her normal regime or meals except, no NSAIDs since her episode.  She does recall rare episodes of hives in 2009 with unclear trigger and no further difficulties until now.  Denies Urgent care visits, antibiotic courses.  Medical History: Past Medical History  Diagnosis Date  . Migraines   . Breast mass in female  right  . Arthritis   . Hyperlipidemia   . Breast cancer ([place])     Right  . Left rotator cuff tear   . Morbid obesity ([place])   . Anemia LIFELONG    HEAVY MENSES MAR 2012 HB 13 MCV 86.7 FERRITIN  66  . 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 RUPTURE ROTATOR CUFF 06/08/2007    Qualifier: Diagnosis of  By: Aline Brochure MD, Dorothyann Peng    . Urticaria    Surgical History: Past Surgical History  Procedure Laterality Date  . Dilation  and curettage of uterus    . Colonoscopy  2004 DR. SMITH    No polyp, hemorrhoids  . Colonoscopy  06/09/10    [id number] adenomas   . Hystersonogram  2009  . Dilation and curettage of uterus  05/2002  . Mastectomy  1999    left breast cancer   . Breast surgery  11/1997    Left breast cancer-mastectomy  . Breast biopsy  02/13/2011    Procedure: BREAST BIOPSY WITH NEEDLE LOCALIZATION;  Surgeon: Adin Hector, MD;  Location: [facility];  Service: General;  Laterality: Right;  . Breast surgery  2/13-    rt axilly bx  . Portacath placement  03/27/2011    Procedure: INSERTION PORT-A-CATH;  Surgeon: Adin Hector, MD;  Location: [facility];  Service: General;  Laterality: Left;  insert port a cath  . Port-a-cath removal  10/20/2011    Procedure: MINOR REMOVAL PORT-A-CATH;  Surgeon: Adin Hector, MD;  Location: [facility];  Service: General;  Laterality: N/A;  Porta-cath removal  left   Family History: Family History  Problem Relation Age of Onset  . Hypertension Mother   . Cancer Father     lung cancer  . Diabetes Sister   . Cancer Brother     colon cancer  . Heart disease Sister     heart attack  . Asthma Sister   . Eczema Sister   . Allergic rhinitis Neg Hx   . Urticaria Neg Hx    Social History: Social History  . Marital Status: Single    Spouse Name: N/A  . Number of Children: N/A  . Years of Education: N/A   Social History Main Topics  . Smoking status: Never Smoker   . Smokeless tobacco: Never Used  . Alcohol Use: 1.0 oz/week    2 drink(s) per week     Comment: occasional  . Drug Use: No  . 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   No tobacco products   Kathleen Reynolds has a current medication list which includes the following prescription(s): butalbital-acetaminophen-caffeine, calcium-vitamin d,  diphenhydramine hcl, fluticasone, meclizine, metformin, multivitamin with minerals and epinephrine.   Drug Allergies: Allergies  Allergen Reactions  . Nsaids Anaphylaxis    Ibuprofen required hospitalization in 2016 due to ibuprofen  . Ibuprofen Itching   Environmental History: [place] lives in a 55 year old house for 14 years wood floors, central air and heat, indoor dog, Stuffed mattress, non-feather pillow and comforter, without humidifier, or smokers.  Review of Systems  Constitutional: Negative for fever, weight loss and malaise/fatigue.  HENT: Positive for congestion. Negative for ear pain, hearing loss, nosebleeds and sore throat.   Eyes: Negative for discharge and redness.  Respiratory: Negative for shortness of breath.  Denies history of bronchitis and pneumonia.  Negative PPD.  Gastrointestinal: Negative for heartburn, nausea, vomiting, abdominal pain, diarrhea and constipation.  Genitourinary: Negative.   Musculoskeletal: Positive for joint pain. Negative for myalgias.  Skin: Negative.  Negative for itching and rash.       Currently asymptomatic, see history of present illness.  Neurological: Positive for headaches. Negative for dizziness, seizures and weakness.  Endo/Heme/Allergies: Positive for environmental allergies.       Denies sensitivity to stinging insects, foods, latex, jewelry and cosmetics.   Objective:   Filed Vitals:   03/19/15 0842  BP: 120/78  Pulse: 82  Temp: 98 F (36.7 C)  Resp: 18   Physical Exam  Constitutional: She is well-developed, well-nourished, and in no distress.  HENT:  Head: Atraumatic.  Right Ear: Tympanic membrane and ear canal normal.  Left Ear: Tympanic membrane and ear canal normal.  Nose: Mucosal edema present. No rhinorrhea. No epistaxis.  Mouth/Throat: Oropharynx is clear and moist and mucous membranes are normal. No oropharyngeal exudate, posterior oropharyngeal edema or posterior oropharyngeal erythema.  Eyes:  Conjunctivae are normal.  Neck: Neck supple.  Cardiovascular: Normal rate, S1 normal and S2 normal.   No murmur heard. Pulmonary/Chest: Effort normal. She has no wheezes. She has no rhonchi. She has no rales.  Abdominal: Soft. Normal appearance and bowel sounds are normal.  Musculoskeletal: She exhibits no edema.  Lymphadenopathy:    She has no cervical adenopathy.  Neurological: She is alert.  Skin: Skin is warm and intact. No rash noted. No cyanosis. Nails show no clubbing.   Diagnostics:   Skin testing: Very strong reactivity to ash tree pollen; mild reactivity to tree pollen mix, dust mite, cockroach and elm tree pollen (reactive histamine control).---after testing she did report she had taken meclizine 24 hours prior to appointment.    Roselyn M. Ishmael Holter, MD   cc: [name], MD

## 2015-03-19 NOTE — Patient Instructions (Addendum)
Take Home Sheet  1. Avoidance: Mite and Pollen  And ibuprofen (all NSAIDS) for now.   2. Antihistamine: Claritin 10mg  one by mouth once daily for runny nose or itching.   3. Nasal Spray: Flonase 1-2 spray(s) each nostril once daily for stuffy nose or drainage.    4.  Selected labs at  [facility]   5.  If new episodes of rash/skin changes take picture and document environment exposure/ingestion/activity.  6. Nasal Saline wash each evening at shower time.   7. Epi-pen?benadryl as needed.  8. Follow up Visit:  2 months or sooner if needed.   Websites that have reliable Patient information: 1. American Academy of Asthma, Allergy, & Immunology: www.aaaai.org 2. Food Allergy Network: www.foodallergy.org 3. Mothers of Asthmatics: www.aanma.org 4. [place]: DiningCalendar.de 5. American College of Allergy, Asthma, & Immunology: https://robertson.info/ or www.acaai.org

## 2015-03-19 NOTE — Telephone Encounter (Signed)
Dr. Ishmael Holter wanted me to call patient to see if patient can sign a records release to get information from [facility] concerning her hospitalization.  Tried to call.  No answer.  Left message to call the [place] office today 03/19/15

## 2015-03-20 NOTE — Telephone Encounter (Signed)
Called patient.  Left message on voicemail requesting a return call.

## 2015-03-21 NOTE — Telephone Encounter (Signed)
I left message on voicemail to call office.

## 2015-03-26 NOTE — Telephone Encounter (Signed)
Tried to call patient.  No answer.  Left message to call our office in [place] today before 5:00 PM.  Need to get patient to sign a medical release for Dr. Ishmael Holter to get records from [facility] hospitalization visit.

## 2015-03-26 NOTE — Telephone Encounter (Signed)
Spoke with patient today at 4:39 PM and she states will come by the [place] office on Tuesday, Feb. 28,2017 and sign the medical records release, so Dr. Ishmael Holter can get the records from [facility].

## 2015-04-01 ENCOUNTER — Encounter: Payer: BLUE CROSS/BLUE SHIELD | Attending: Family Medicine | Admitting: Nutrition

## 2015-04-01 VITALS — Ht 67.0 in | Wt 292.8 lb

## 2015-04-01 DIAGNOSIS — E785 Hyperlipidemia, unspecified: Secondary | ICD-10-CM

## 2015-04-01 DIAGNOSIS — R7303 Prediabetes: Secondary | ICD-10-CM | POA: Diagnosis not present

## 2015-04-01 DIAGNOSIS — R739 Hyperglycemia, unspecified: Secondary | ICD-10-CM

## 2015-04-01 NOTE — Patient Instructions (Addendum)
    Goals: 1. Follow My Plate Method  2. Walk 15 minutes in the morning before going to work. 3. Choose to buy the 100 caloire pack popcorn. 4. Lose 1-2 lbs per week. 5. Put money in jar that you have saved. 6. Choose fruit or veggies or yogurt. 7. Get A1C less than 5.7% in three months. 8. Keep a food journal and bring at next visit.

## 2015-04-01 NOTE — Progress Notes (Signed)
  Medical Nutrition Therapy:  Appt start time: 0800  end time:  0830.  Assessment:  Primary concerns today: Prediabetes, obesity. Lost 7 lbs. Has made changes with eating more fresh fruits, vegetables. Working on cutting out unnecessary snacks. Eating three better balanced meals. Cut out junk food and fast foods for the most part. Starting to walk daily before going to get job. Kept a food journal a few days but got side tracked. Only drinking water.  Diet is improving.  Preferred Learning Style:   No preference indicated   Learning Readiness:  Ready  Change in progress   MEDICATIONS: See list   DIETARY INTAKE:  24-hr recall:  B ( AM): Oatmeal with cinnamon, or apple or banaa, water,  Snk ( AM):  L ( PM): [place] sandwich on ww bread, let/tomato and mayonnaise, water Snk ( PM): apple, or carrots D ( PM): Homemade taco salad. Snk ( PM):  sometimes popcorn Beverages: water, milk, wine occasionally.  Usual physical activity: ADL  Estimated energy needs: 1500 calories 170 g carbohydrates 112 g protein 42 g fat  Progress Towards Goal(s):  In progress.   Nutritional Diagnosis:  NB-1.1 Food and nutrition-related knowledge deficit As related to Obesity and Prediabetse.  As evidenced by BMI>40 and A1C 6.1%..    Intervention: Nutrition and Diabetes education provided on My Plate, CHO counting, meal planning, portion sizes, timing of meals, avoiding snacks between meals unless having a low blood sugar, target ranges for A1C and blood sugars, signs/symptoms and treatment of hyper/hypoglycemia, monitoring blood sugars, taking medications as prescribed, benefits of exercising 30 minutes per day and prevention of complications of DM.  Goals: 1. Follow My Plate Method  2. Walk 15 minutes in the morning before going to work. 3. Choose to buy the 100 caloire pack popcorn. 4. Lose 1-2 lbs per week. 5. Put money in jar that you have saved. 6. Choose fruit or veggies or yogurt. 7. Get  A1C less than 5.7% in three months. 8. Keep a food journal and bring at next visit.   Teaching Method Utilized:  Visual Auditory Hands on  Handouts given during visit include: The Plate Method Meal Plan Card Diabetes Instructions  Barriers to learning/adherence to lifestyle change: None  Demonstrated degree of understanding via:  Teach Back   Monitoring/Evaluation:  Dietary intake, exercise, meal planning, food journal, and body weight in 1 month(s). Recommend to start a statin drug, omega three fish oil and a baby Asprin due to CVD risk factors with prediabetes and hyperlipidemia.

## 2015-04-23 ENCOUNTER — Encounter: Payer: Self-pay | Admitting: Gastroenterology

## 2015-04-30 ENCOUNTER — Other Ambulatory Visit: Payer: Self-pay | Admitting: Allergy and Immunology

## 2015-04-30 ENCOUNTER — Encounter: Payer: Self-pay | Admitting: *Deleted

## 2015-04-30 LAB — BASIC METABOLIC PANEL
BUN: 13 mg/dL (ref 7–25)
CO2: 23 mmol/L (ref 20–31)
Calcium: 9.3 mg/dL (ref 8.6–10.4)
Chloride: 107 mmol/L (ref 98–110)
Creat: 0.7 mg/dL (ref 0.50–1.05)
Glucose, Bld: 104 mg/dL — ABNORMAL HIGH (ref 65–99)
POTASSIUM: 3.8 mmol/L (ref 3.5–5.3)
SODIUM: 139 mmol/L (ref 135–146)

## 2015-04-30 LAB — HIV ANTIBODY (ROUTINE TESTING W REFLEX): HIV: NONREACTIVE

## 2015-04-30 LAB — HEPATITIS C ANTIBODY: HCV AB: NEGATIVE

## 2015-04-30 LAB — TSH: TSH: 2.39 m[IU]/L

## 2015-05-01 LAB — HEMOGLOBIN A1C
Hgb A1c MFr Bld: 6.1 % — ABNORMAL HIGH (ref <5.7)
Mean Plasma Glucose: 128 mg/dL

## 2015-05-01 LAB — IGE: IGE (IMMUNOGLOBULIN E), SERUM: 50 kU/L (ref <115)

## 2015-05-03 LAB — ALPHA-GAL PANEL
Allergen, Pork, f26: <0.1 kU/L
Beef: <0.1 kU/L
Galactose-alpha-1,3-galactose IgE: <0.1 kU/L (ref <0.35)

## 2015-05-07 ENCOUNTER — Encounter: Payer: Self-pay | Admitting: Family Medicine

## 2015-05-07 ENCOUNTER — Ambulatory Visit (INDEPENDENT_AMBULATORY_CARE_PROVIDER_SITE_OTHER): Payer: BLUE CROSS/BLUE SHIELD | Admitting: Family Medicine

## 2015-05-07 VITALS — BP 124/80 | HR 90 | Resp 16 | Ht 67.0 in | Wt 293.4 lb

## 2015-05-07 DIAGNOSIS — M62838 Other muscle spasm: Secondary | ICD-10-CM

## 2015-05-07 DIAGNOSIS — R7303 Prediabetes: Secondary | ICD-10-CM | POA: Diagnosis not present

## 2015-05-07 DIAGNOSIS — E785 Hyperlipidemia, unspecified: Secondary | ICD-10-CM

## 2015-05-07 DIAGNOSIS — E8881 Metabolic syndrome: Secondary | ICD-10-CM

## 2015-05-07 DIAGNOSIS — M6248 Contracture of muscle, other site: Secondary | ICD-10-CM | POA: Diagnosis not present

## 2015-05-07 DIAGNOSIS — J3089 Other allergic rhinitis: Secondary | ICD-10-CM

## 2015-05-07 DIAGNOSIS — R42 Dizziness and giddiness: Secondary | ICD-10-CM

## 2015-05-07 DIAGNOSIS — E559 Vitamin D deficiency, unspecified: Secondary | ICD-10-CM

## 2015-05-07 MED ORDER — DIAZEPAM 5 MG PO TABS: ORAL_TABLET | ORAL | Status: DC

## 2015-05-07 MED ORDER — BUTALBITAL-APAP-CAFFEINE 50-325-40 MG PO TABS: 1.0000 | ORAL_TABLET | Freq: Two times a day (BID) | ORAL | Status: DC | PRN

## 2015-05-07 MED ORDER — PREDNISONE 5 MG (21) PO TBPK
5.0000 mg | ORAL_TABLET | ORAL | Status: DC
Start: 2015-05-07 — End: 2015-08-16

## 2015-05-07 NOTE — Progress Notes (Signed)
Subjective:    Patient ID: Kathleen Reynolds, female    DOB: December 31, 1960, 55 y.o.   MRN: [id number]  HPI   Kathleen Reynolds     MRN: [id number]      DOB: 10/04/1960   HPI Kathleen Reynolds is here for follow up and re-evaluation of chronic medical conditions, medication management and review of any available recent lab and radiology data.  Preventive health is updated, specifically  Cancer screening and Immunization.   Questions or concerns regarding consultations or procedures which the PT has had in the interim are  Addressed.Happyu with nutrition consult and has lost 6 pounds! The PT denies any adverse reactions to current medications since the last visit.  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 C/o need for daily use of antivert , dizziness persists and is more severe  ROS Denies recent fever or chills. Denies sinus pressure, nasal congestion, ear pain or sore throat. Denies chest congestion, productive cough or wheezing. Denies chest pains, palpitations and leg swelling Denies abdominal pain, nausea, vomiting,diarrhea or constipation.   Denies dysuria, frequency, hesitancy or incontinenDenies joint pain, swelling and limitation in mobility. Denies headaches, seizures, numbness, or tingling. Denies depression, anxiety or insomnia. Denies skin break down or rash.   PE  BP 124/80 mmHg  Pulse 90  Resp 16  Ht 5\' 7"  (1.702 m)  Wt 293 lb 6.4 oz (133.085 kg)  BMI 45.94 kg/m2  SpO2 96%  LMP 03/27/2011  Patient alert and oriented and in no cardiopulmonary distress.  HEENT: No facial asymmetry, EOMI,   oropharynx pink and moist.  Neck decreased ROM with right spasm,  no JVD, no mass.No nystagmus  Chest: Clear to auscultation bilaterally.  CVS: S1, S2 no murmurs, no S3.Regular rate.  ABD: Soft non tender.   Ext: No edema  MS: Adequate though reduced  ROM lumbar spine normal in  shoulders, hips and knees.  Skin: Intact, no  ulcerations or rash noted.  Psych: Good eye contact, normal affect. Memory intact not anxious or depressed appearing.  CNS: CN 2-12 intact, power,  normal throughout.no focal deficits noted.   Assessment & Plan   Vertigo, intermittent Increased,  frequency and severity of vertigo, feels as though she needs to take antivert daily, will refer to ENT for furhter eval and management  Neck muscle spasm Acute flare following upper extremity jerking 1 week prior, short prednisone course and valium, work excuse x 2 days  Dyslipidemia Hyperlipidemia:Low fat diet discussed and encouraged.   Lipid Panel  Lab Results  Component Value Date   CHOL 211* 07/31/2014   HDL 63 07/31/2014   LDLCALC 121* 07/31/2014   TRIG 137 07/31/2014   CHOLHDL 3.3 07/31/2014   Uncontrolled Updated lab needed at/ before next visit.      Obesity, morbid, BMI 40.0-49.9 ([place]) Improved. Pt applauded on succesful weight loss through lifestyle change, and encouraged to continue same. Weight loss goal set for the next several months.   Prediabetes Patient educated about the importance of limiting  Carbohydrate intake , the need to commit to daily physical activity for a minimum of 30 minutes , and to commit weight loss. The fact that changes in all these areas will reduce or eliminate all together the development of diabetes is stressed.  Unchanged  Diabetic Labs Latest Ref Rng 04/30/2015 08/01/2014 07/31/2014 05/01/2014 12/18/2013  HbA1c <5.7 % 6.1(H) - 6.1(H) - 5.9(H)  Chol 0 - 200 mg/dL - - 211(H) -  201(H)  HDL >=46 mg/dL - - 63 - 57  Calc LDL 0 - 99 mg/dL - - 121(H) - 116(H)  Triglycerides <150 mg/dL - - 137 - 138  Creatinine 0.50 - 1.05 mg/dL 0.70 0.69 - 0.65 0.75   BP/Weight 05/07/2015 04/01/2015 03/19/2015 02/20/2015 02/20/2015 02/05/2015 [id number]  Systolic BP A999333 - 123456 - 123456 123456 [id number]  Diastolic BP 80 - 78 - 74 76 70  Wt. (Lbs) 293.4 292.8 293 298.6 299 297 299.4  BMI 45.94 45.85 51.15 46.76 46.82 46.51  46.88   No flowsheet data found.     Allergic rhinitis Controlled, no change in medication        Review of Systems     Objective:   Physical Exam        Assessment & Plan:

## 2015-05-07 NOTE — Patient Instructions (Addendum)
F/u in 4.5 month, call if you need me before  You are referred to eNT for recurrent vertigo  Prednisone and valiuum sent for left neck spasm, and  Work excuse from today return on 05/09/2015  CONGRATS on 6 pound weight loss Goal is 10 pounds  Please work on good  health habits so that your health will improve. 1. Commitment to daily physical activity for 30 to 60  minutes, if you are able to do this.  2. Commitment to wise food choices. Aim for half of your  food intake to be vegetable and fruit, one quarter starchy foods, and one quarter protein. Try to eat on a regular schedule  3 meals per day, snacking between meals should be limited to vegetables or fruits or small portions of nuts. 64 ounces of water per day is generally recommended, unless you have specific health conditions, like heart failure or kidney failure where you will need to limit fluid intake.  3. Commitment to sufficient and a  good quality of physical and mental rest daily, generally between 6 to 8 hours per day.  WITH PERSISTANCE AND PERSEVERANCE, THE IMPOSSIBLE , BECOMES THE NORM! Thank you  for choosing [facility]. We consider it a privelige to serve you.  Delivering excellent health care in a caring and  compassionate way is our goal.  Partnering with you,  so that together we can achieve this goal is our strategy. Fasting labs in 4.5 month

## 2015-05-13 ENCOUNTER — Telehealth: Payer: Self-pay | Admitting: Allergy and Immunology

## 2015-05-13 NOTE — Assessment & Plan Note (Signed)
Improved. Pt applauded on succesful weight loss through lifestyle change, and encouraged to continue same. Weight loss goal set for the next several months.  

## 2015-05-13 NOTE — Telephone Encounter (Signed)
Phone call to patient to review lab results--all negative.  No answer and no voicemail. Unable to leave message. Will review at return office visit.

## 2015-05-13 NOTE — Assessment & Plan Note (Signed)
Controlled, no change in medication  

## 2015-05-13 NOTE — Assessment & Plan Note (Signed)
Increased,  frequency and severity of vertigo, feels as though she needs to take antivert daily, will refer to ENT for furhter eval and management

## 2015-05-13 NOTE — Assessment & Plan Note (Signed)
Acute flare following upper extremity jerking 1 week prior, short prednisone course and valium, work excuse x 2 days

## 2015-05-13 NOTE — Assessment & Plan Note (Signed)
Patient educated about the importance of limiting  Carbohydrate intake , the need to commit to daily physical activity for a minimum of 30 minutes , and to commit weight loss. The fact that changes in all these areas will reduce or eliminate all together the development of diabetes is stressed.  Unchanged  Diabetic Labs Latest Ref Rng 04/30/2015 08/01/2014 07/31/2014 05/01/2014 12/18/2013  HbA1c <5.7 % 6.1(H) - 6.1(H) - 5.9(H)  Chol 0 - 200 mg/dL - - 211(H) - 201(H)  HDL >=46 mg/dL - - 63 - 57  Calc LDL 0 - 99 mg/dL - - 121(H) - 116(H)  Triglycerides <150 mg/dL - - 137 - 138  Creatinine 0.50 - 1.05 mg/dL 0.70 0.69 - 0.65 0.75   BP/Weight 05/07/2015 04/01/2015 03/19/2015 02/20/2015 02/20/2015 02/05/2015 [id number]  Systolic BP A999333 - 123456 - 123456 123456 [id number]  Diastolic BP 80 - 78 - 74 76 70  Wt. (Lbs) 293.4 292.8 293 298.6 299 297 299.4  BMI 45.94 45.85 51.15 46.76 46.82 46.51 46.88   No flowsheet data found.

## 2015-05-13 NOTE — Assessment & Plan Note (Signed)
Hyperlipidemia:Low fat diet discussed and encouraged.   Lipid Panel  Lab Results  Component Value Date   CHOL 211* 07/31/2014   HDL 63 07/31/2014   LDLCALC 121* 07/31/2014   TRIG 137 07/31/2014   CHOLHDL 3.3 07/31/2014   Uncontrolled Updated lab needed at/ before next visit.

## 2015-05-23 ENCOUNTER — Ambulatory Visit (HOSPITAL_COMMUNITY): Payer: BLUE CROSS/BLUE SHIELD

## 2015-05-23 ENCOUNTER — Ambulatory Visit: Payer: Self-pay | Admitting: Gastroenterology

## 2015-05-31 ENCOUNTER — Ambulatory Visit: Payer: Self-pay | Admitting: Nutrition

## 2015-06-04 ENCOUNTER — Ambulatory Visit: Payer: BLUE CROSS/BLUE SHIELD | Admitting: Allergy and Immunology

## 2015-06-13 ENCOUNTER — Encounter (INDEPENDENT_AMBULATORY_CARE_PROVIDER_SITE_OTHER): Payer: Self-pay | Admitting: Gastroenterology

## 2015-06-13 NOTE — Progress Notes (Signed)
   Subjective:    Patient ID: Kathleen Reynolds, female    DOB: [birth year], 55 y.o.   MRN: [id number]  Kathleen Nakayama, MD  HPI   PT DENIES FEVER, CHILLS, HEMATOCHEZIA, HEMATEMESIS, nausea, vomiting, melena, diarrhea, CHEST PAIN, SHORTNESS OF BREATH,  CHANGE IN BOWEL IN HABITS, constipation, abdominal pain, problems swallowing, problems with sedation, heartburn or indigestion.   Past Medical History  Diagnosis Date  . Migraines   . Breast mass in female     right  . Arthritis   . Hyperlipidemia   . Breast cancer ([place])     Right  . Left rotator cuff tear   . Morbid obesity ([place])   . Anemia LIFELONG    HEAVY MENSES MAR 2012 HB 13 MCV 86.7 FERRITIN  66  . 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 RUPTURE ROTATOR CUFF 06/08/2007    Qualifier: Diagnosis of  By: Aline Brochure MD, Dorothyann Peng    . Urticaria      Past Surgical History  Procedure Laterality Date  . Dilation and curettage of uterus    . Colonoscopy  2004 DR. SMITH    No polyp, hemorrhoids  . Colonoscopy  06/09/10    [id number] adenomas   . Hystersonogram  2009  . Dilation and curettage of uterus  05/2002  . Mastectomy  1999    left breast cancer   . Breast surgery  11/1997    Left breast cancer-mastectomy  . Breast biopsy  02/13/2011    Procedure: BREAST BIOPSY WITH NEEDLE LOCALIZATION;  Surgeon: Adin Hector, MD;  Location: [facility];  Service: General;  Laterality: Right;  . Breast surgery  2/13-    rt axilly bx  . Portacath placement  03/27/2011    Procedure: INSERTION PORT-A-CATH;  Surgeon: Adin Hector, MD;  Location: [facility];  Service: General;  Laterality: Left;  insert port a cath  . Port-a-cath removal  10/20/2011    Procedure: MINOR REMOVAL PORT-A-CATH;  Surgeon: Adin Hector, MD;  Location: [facility] [facility];  Service: General;  Laterality: N/A;  Porta-cath removal  left    Allergies  Allergen Reactions  . Nsaids Anaphylaxis    Ibuprofen required hospitalization in  2016 due to ibuprofen  . Ibuprofen Itching       Review of Systems PER HPI OTHERWISE ALL SYSTEMS ARE NEGATIVE.    Objective:   Physical Exam  Constitutional: She is oriented to person, place, and time. She appears well-developed and well-nourished. No distress.  HENT:  Head: Normocephalic and atraumatic.  Mouth/Throat: Oropharynx is clear and moist. No oropharyngeal exudate.  Eyes: Pupils are equal, round, and reactive to light. No scleral icterus.  Neck: Normal range of motion. Neck supple.  Cardiovascular: Normal rate, regular rhythm and normal heart sounds.   Pulmonary/Chest: Effort normal and breath sounds normal. No respiratory distress.  Abdominal: Soft. Bowel sounds are normal. She exhibits no distension. There is no tenderness.  Musculoskeletal: She exhibits no edema.  Lymphadenopathy:    She has no cervical adenopathy.  Neurological: She is alert and oriented to person, place, and time.  Psychiatric: She has a normal mood and affect.  Vitals reviewed.         Assessment & Plan:

## 2015-07-25 ENCOUNTER — Other Ambulatory Visit: Payer: Self-pay

## 2015-07-25 ENCOUNTER — Encounter: Payer: Self-pay | Admitting: Gastroenterology

## 2015-07-25 ENCOUNTER — Ambulatory Visit (INDEPENDENT_AMBULATORY_CARE_PROVIDER_SITE_OTHER): Payer: BLUE CROSS/BLUE SHIELD | Admitting: Gastroenterology

## 2015-07-25 VITALS — BP 149/79 | HR 72 | Temp 97.3°F | Ht 64.0 in | Wt 288.4 lb

## 2015-07-25 DIAGNOSIS — Z1211 Encounter for screening for malignant neoplasm of colon: Secondary | ICD-10-CM | POA: Diagnosis not present

## 2015-07-25 MED ORDER — NA SULFATE-K SULFATE-MG SULF 17.5-3.13-1.6 GM/177ML PO SOLN: 1.0000 | ORAL | Status: DC

## 2015-07-25 NOTE — Progress Notes (Signed)
Subjective:    Patient ID: Kathleen Reynolds, female    DOB: 05/18/1960, 55 y.o.   MRN: [id number]  [name], MD  HPI Working on weight loss. HAVING TROUBLE WITH ACHILLES TENDINITIS. PT DENIES FEVER, CHILLS, HEMATOCHEZIA, HEMATEMESIS, nausea, vomiting, melena, diarrhea, CHEST PAIN, SHORTNESS OF BREATH,  CHANGE IN BOWEL IN HABITS, constipation, abdominal pain, problems swallowing, problems with sedation, heartburn or indigestion.   Past Medical History  Diagnosis Date  . Migraines   . Breast mass in female     right  . Arthritis   . Hyperlipidemia   . Breast cancer ([place])     Right  . Left rotator cuff tear   . Morbid obesity ([place])   . Anemia LIFELONG    HEAVY MENSES MAR 2012 HB 13 MCV 86.7 FERRITIN  66  . 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 RUPTURE ROTATOR CUFF 06/08/2007    Qualifier: Diagnosis of  By: Aline Brochure MD, Dorothyann Peng    . Urticaria     Past Surgical History  Procedure Laterality Date  . Dilation and curettage of uterus    . Colonoscopy  2004 DR. SMITH    No polyp, hemorrhoids  . Colonoscopy  06/09/10    [id number] adenomas   . Hystersonogram  2009  . Dilation and curettage of uterus  05/2002  . Mastectomy  1999    left breast cancer   . Breast surgery  11/1997    Left breast cancer-mastectomy  . Breast biopsy  02/13/2011    Procedure: BREAST BIOPSY WITH NEEDLE LOCALIZATION;  Surgeon: Adin Hector, MD;  Location: [place];  Service: General;  Laterality: Right;  . Breast surgery  2/13-    rt axilly bx  . Portacath placement  03/27/2011    Procedure: INSERTION PORT-A-CATH;  Surgeon: Adin Hector, MD;  Location: [facility] [place];  Service: General;  Laterality: Left;  insert port a cath  . Port-a-cath removal  10/20/2011    Procedure: MINOR REMOVAL PORT-A-CATH;  Surgeon: Adin Hector, MD;  Location: [facility];  Service: General;  Laterality: N/A;  Porta-cath removal  left   Allergies  Allergen Reactions  .  Nsaids Anaphylaxis    Ibuprofen required hospitalization in 2016 due to ibuprofen  . Ibuprofen Itching   Current Outpatient Prescriptions  Medication Sig Dispense Refill  . FIORICET, ESGIC 50-325-40 MG tablet Take 1 tablet by mouth 2 (two) times daily as needed. For migraine    . calcium-vitamin D (OSCAL WITH D 500-200 MG-UNIT per tablet Take 1 tablet by mouth.    . DiphenhydrAMINE HCl (BENADRYL ALLERGY PO) Take by mouth.    . EPINEPHrine 0.3 mg/0.3 mL IJ SOAJ injection Inject 0.3 mLs (0.3 mg total) into the muscle once.    . Flaxseed, Linseed, (FLAXSEED OIL) 1000 MG CAPS Take 2,000 mg by mouth daily. Reported on 03/19/2015    . fluticasone (FLONASE) 50 MCG/ACT nasal spray Place 2 sprays into both nostrils daily.    . meclizine (ANTIVERT) 25 MG tablet Take 1 tablet (25 mg total) by mouth 3 (three) times daily as needed. For dizziness    . metFORMIN (GLUCOPHAGE) 500 MG tablet Take 1 tablet (500 mg total) by mouth 2 (two) times daily with a meal.    . Multiple Vitamin (MULITIVITAMIN WITH MINERALS) TABS Take 1 tablet by mouth daily.    .      .       Review of Systems PER HPI OTHERWISE ALL SYSTEMS ARE NEGATIVE.  Objective:   Physical Exam  Constitutional: She is oriented to person, place, and time. She appears well-developed and well-nourished. No distress.  HENT:  Head: Normocephalic and atraumatic.  Mouth/Throat: Oropharynx is clear and moist. No oropharyngeal exudate.  Eyes: Pupils are equal, round, and reactive to light. No scleral icterus.  Neck: Normal range of motion. Neck supple.  Cardiovascular: Normal rate, regular rhythm and normal heart sounds.   Pulmonary/Chest: Effort normal and breath sounds normal. No respiratory distress.  Abdominal: Soft. Bowel sounds are normal. She exhibits no distension. There is no tenderness.  Musculoskeletal: She exhibits no edema.  Lymphadenopathy:    She has no cervical adenopathy.  Neurological: She is alert and oriented to person, place,  and time.  Psychiatric: She has a normal mood and affect.  Vitals reviewed.     Assessment & Plan:

## 2015-07-25 NOTE — Progress Notes (Signed)
cc'ed to pcp °

## 2015-07-25 NOTE — Patient Instructions (Signed)
FULL LIQUIDS WITH BREAKFAST ON DAY BEFORE COLONOSCOPY. SEE INFO BELOW.  COLONOSCOPY IN July 2017.  Full Liquid Diet A high-calorie, high-protein supplement should be used to meet your nutritional requirements when the full liquid diet is continued for more than 2 or 3 days. If this diet is to be used for an extended period of time (more than 7 days), a multivitamin should be considered.  Breads and Starches  Allowed: None are allowed.   Avoid: Any others.    Potatoes/Pasta/Rice  Allowed: ANY ITEM AS A SOUP OR SMALL PLATE OF MASHED POTATOES OR SCRAMBLED EGGS.       Vegetables  Allowed: Strained tomato or vegetable juice. Vegetables pureed in soup.   Avoid: Any others.    Fruit  Allowed: Any strained fruit juices and fruit drinks. Include 1 serving of citrus or vitamin C-enriched fruit juice daily.   Avoid: Any others.  Meat and Meat Substitutes  Allowed: Egg  Avoid: Any meat, fish, or fowl. All cheese.  Milk  Allowed: SOY Milk beverages, including milk shakes and instant breakfast mixes. Smooth yogurt.   Avoid: Any others. Avoid dairy products if not tolerated.    Soups and Combination Foods  Allowed: Broth, strained cream soups. Strained, broth-based soups.   Avoid: Any others.    Desserts and Sweets  Allowed: flavored gelatin, tapioca, ice cream, sherbet, smooth pudding, junket, fruit ices, frozen ice pops, pudding pops, frozen fudge pops, chocolate syrup. Sugar, honey, jelly, syrup.   Avoid: Any others.  Fats and Oils  Allowed: Margarine, butter, cream, sour cream, oils.   Avoid: Any others.  Beverages  Allowed: All.   Avoid: None.  Condiments  Allowed: Iodized salt, pepper, spices, flavorings. Cocoa powder.   Avoid: Any others.    SAMPLE MEAL PLAN Breakfast   cup orange juice.   1 OR 2 EGGS  1 cup milk.   1 cup beverage (coffee or tea).   Cream or sugar, if desired.    Midmorning Snack  2 SCRAMBLED OR HARD BOILED  EGG   Lunch  1 cup cream soup.    cup fruit juice.   1 cup milk.    cup custard.   1 cup beverage (coffee or tea).   Cream or sugar, if desired.    Midafternoon Snack  1 cup milk shake.  Dinner  1 cup cream soup.    cup fruit juice.   1 cup MILK    cup pudding.   1 cup beverage (coffee or tea).   Cream or sugar, if desired.  Evening Snack  1 cup supplement.  To increase calories, add sugar, cream, butter, or margarine if possible. Nutritional supplements will also increase the total calories.

## 2015-07-25 NOTE — Assessment & Plan Note (Signed)
Weight trending down. LIMITED BY PAIN IN HEELS.  CALL IF SHE WOULD LIKE TO SEE DR. GOSRANI.

## 2015-07-25 NOTE — Assessment & Plan Note (Signed)
BRO HAD COLON CA AGE < 60. LAST TCS MAY 2012.  FULL LIQUIDS WITH BREAKFAST ON DAY BEFORE COLONOSCOPY. SEE INFO BELOW. CONTINUE GLUCOPHAGE. NO NEED TO HOLD. COLONOSCOPY IN July 2017. DISCUSSED PROCEDURE, BENEFITS, & RISKS: < 1% chance of medication reaction, bleeding, perforation, or rupture of spleen/liver.

## 2015-08-01 ENCOUNTER — Telehealth: Payer: Self-pay | Admitting: Gastroenterology

## 2015-08-01 NOTE — Telephone Encounter (Signed)
Pt is scheduled for 7/17 and her prep is too expensive. Is there something else cheaper or do we have a sample kit to give her? Please call [phone]

## 2015-08-02 NOTE — Telephone Encounter (Signed)
Pt will come by to pick up a sample

## 2015-08-07 ENCOUNTER — Other Ambulatory Visit: Payer: Self-pay | Admitting: Family Medicine

## 2015-08-16 ENCOUNTER — Other Ambulatory Visit (HOSPITAL_COMMUNITY): Payer: Self-pay | Admitting: Hematology & Oncology

## 2015-08-16 ENCOUNTER — Encounter (HOSPITAL_COMMUNITY): Payer: BLUE CROSS/BLUE SHIELD | Attending: Hematology & Oncology | Admitting: Hematology & Oncology

## 2015-08-16 ENCOUNTER — Encounter (HOSPITAL_COMMUNITY): Payer: Self-pay | Admitting: Hematology & Oncology

## 2015-08-16 VITALS — BP 127/71 | HR 78 | Temp 98.1°F | Resp 16 | Wt 292.2 lb

## 2015-08-16 DIAGNOSIS — C50912 Malignant neoplasm of unspecified site of left female breast: Secondary | ICD-10-CM

## 2015-08-16 DIAGNOSIS — Z853 Personal history of malignant neoplasm of breast: Secondary | ICD-10-CM

## 2015-08-16 DIAGNOSIS — C50911 Malignant neoplasm of unspecified site of right female breast: Secondary | ICD-10-CM

## 2015-08-16 DIAGNOSIS — R222 Localized swelling, mass and lump, trunk: Secondary | ICD-10-CM | POA: Diagnosis not present

## 2015-08-16 DIAGNOSIS — N63 Unspecified lump in breast: Secondary | ICD-10-CM

## 2015-08-16 DIAGNOSIS — Z1501 Genetic susceptibility to malignant neoplasm of breast: Secondary | ICD-10-CM | POA: Diagnosis not present

## 2015-08-16 DIAGNOSIS — N6320 Unspecified lump in the left breast, unspecified quadrant: Secondary | ICD-10-CM

## 2015-08-16 DIAGNOSIS — Z1509 Genetic susceptibility to other malignant neoplasm: Secondary | ICD-10-CM

## 2015-08-16 NOTE — Progress Notes (Signed)
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 Fanny Skates on 02/13/2011 followed by sentinel node biopsy which was negative. Her Ki-67 marker was high at 54% HER-2/neu was nonamplified and she was ER/PR negative. She is now status post 6 cycles of carboplatin and Taxotere and S/P radiation. No evidence of recurrence.  #2.Left-sided breast cancer stage II grade 3 triple negative at that time as well with 7 negative nodes but a 2.3 cm primary with mastectomy on 11/27/1997. She was treated with AC. x4 cycles followed by Taxol x4 cycles thus far without recurrence.  #3. BRCA1 positivity.   CURRENT THERAPY:Observation and surveillance   INTERVAL HISTORY: Kathleen Reynolds 55 y.o. 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 Fanny Skates on 02/13/2011 followed by sentinel node biopsy which was negative. Her Ki-67 marker was high at 54% HER-2/neu was nonamplified and she was ER/PR negative. She is now status post 6 cycles of carboplatin and Taxotere (04/03/11- 07/16/11) and S/P radiation finishing in August 2013. AND History of left-sided breast cancer stage II grade 3 triple negative  with 7 negative nodes but a 2.3 cm primary with mastectomy on 11/27/1997. She was treated with AC. x4 cycles followed by Taxol x4 cycles thus far without recurrence.  She has discussed with Dr. Garwin Brothers (Gynecologist) an oophorectomy due to her BRCA 1 mutation but states that has not been pursued. She notes that she was encouraged to loose weight but has not been able to. He has tried Weight Watchers and states it was too expensive. She wants  to lose weight on her own. She finds increasing her activity level is difficult because of her work schedule.  Kathleen Reynolds returns to the [facility] today unaccompanied.  She's advised that she really needs to have another breast MRI. She notes that they never called her for her last one, even though it was ordered in the computer; then she suddenly remembers that she cancelled it, because she's still paying on the other two. She was advised that she can get a mastectomy to prevent breast cancer occurence and the patient notes "I know."  She will be returning to see her gynecologist at the end of the year.  In terms of how she's been feeling, she notes that she's fine, aside from the back of her L knee. She also notes issues in her heels.  Kathleen Reynolds denies any lumps or bumps that she's noticed. She confirms that her appetite is good, but notes that her energy is "iffy." In terms of her swelling, she denies any big change. She still has headaches, but these are per her normal.  She confirms that she's doing her mammograms here, with 3D mammography.  She has no plans for the summer and notes that her family is doing fine. Her one complaint is the hot weather.  She regretted doing radiation after she did it because it burnt her so badly and she couldn't wear anything over it. She notes that it took her a while to get comfortable with a bra again.  During the breast exam, she notes that she stays tender in a  portion of her R breast, permanent discomfort. She notes that she sometimes has problems in her L chest wall, but not all the time.    Past Medical History  Diagnosis Date  . Migraines   . Breast mass in female     right  . Arthritis   . Hyperlipidemia   . Breast cancer ([place])     Right  . Left rotator cuff tear   . Morbid obesity ([place])   . Anemia LIFELONG    HEAVY MENSES MAR 2012 HB 13 MCV 86.7 FERRITIN  66  . 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 RUPTURE ROTATOR CUFF 06/08/2007     Qualifier: Diagnosis of  By: Aline Brochure MD, Dorothyann Peng    . Urticaria     has Thyroid nodule; Dyslipidemia; Headache; Prediabetes; Invasive ductal carcinoma of right breast, stage 1; BRCA1 positive; OA (osteoarthritis) of knee; Anemia; Obesity, morbid, BMI 40.0-49.9 ([place]); Metabolic syndrome X; Achilles tendonitis, bilateral; Rotator cuff syndrome; Depression with anxiety; Nonspecific elevation of levels of transaminase or lactic acid dehydrogenase (LDH); Vertigo, intermittent; Allergic rhinitis; Sciatica of right side associated with disorder of lumbar spine; Allergic reaction; Neck muscle spasm; and Colon cancer screening on her problem list.     is allergic to nsaids and ibuprofen.  Ms. Kuehne had no medications administered during this visit.  Past Surgical History  Procedure Laterality Date  . Dilation and curettage of uterus    . Colonoscopy  2004 DR. SMITH    No polyp, hemorrhoids  . Colonoscopy  06/09/10    [id number] adenomas   . Hystersonogram  2009  . Dilation and curettage of uterus  05/2002  . Mastectomy  1999    left breast cancer   . Breast surgery  11/1997    Left breast cancer-mastectomy  . Breast biopsy  02/13/2011    Procedure: BREAST BIOPSY WITH NEEDLE LOCALIZATION;  Surgeon: Adin Hector, MD;  Location: [place];  Service: General;  Laterality: Right;  . Breast surgery  2/13-    rt axilly bx  . Portacath placement  03/27/2011    Procedure: INSERTION PORT-A-CATH;  Surgeon: Adin Hector, MD;  Location: [facility];  Service: General;  Laterality: Left;  insert port a cath  . Port-a-cath removal  10/20/2011    Procedure: MINOR REMOVAL PORT-A-CATH;  Surgeon: Adin Hector, MD;  Location: [place];  Service: General;  Laterality: N/A;  Porta-cath removal  left    Denies any headaches, dizziness, double vision, fevers, chills, night sweats, nausea, vomiting, diarrhea, constipation, chest pain, heart palpitations,  shortness of breath, blood in stool, black tarry stool, urinary pain, urinary burning, urinary frequency, hematuria. 14 point review of systems was performed and is negative except as detailed under history of present illness and above    PHYSICAL EXAMINATION  ECOG PERFORMANCE STATUS: 0 - Asymptomatic  Filed Vitals:   08/16/15 1341  BP: 127/71  Pulse: 78  Temp: 98.1 F (36.7 C)  Resp: 16    GENERAL:alert, no distress, well nourished, well developed, comfortable, cooperative, morbidly obese and smiling  SKIN: skin color, texture, turgor are normal, no rashes or significant lesions  HEAD: Normocephalic, No masses, lesions, tenderness or abnormalities  EYES: normal, Conjunctiva are pink and non-injected  EARS: External ears normal  OROPHARYNX:lips, buccal mucosa, and tongue normal and mucous membranes are moist  NECK: supple, no adenopathy, thyroid normal size, non-tender, without nodularity, no stridor, non-tender, trachea midline  LYMPH: no palpable lymphadenopathy, no hepatosplenomegaly  BREAST:right  breast normal without mass, skin or nipple changes or axillary nodes, hyperpigmentation and erythema secondary to radiation with a small fibrotic area in the 7-8 o'clock position. (patient is large breasted)  Left post-mastectomy site well healed; suspicious mass at the 7 o'clock position, mobile and soft LUNGS: clear to auscultation and percussion  HEART: regular rate & rhythm, no murmurs, no gallops, S1 normal and S2 normal  ABDOMEN:abdomen soft, non-tender, morbidly obese and normal bowel sounds  BACK: Back symmetric, no curvature., No CVA tenderness  EXTREMITIES:less then 2 second capillary refill, no joint deformities, effusion, or inflammation, no edema, no skin discoloration, no clubbing, no cyanosis  NEURO: alert & oriented x 3 with fluent speech, no focal motor/sensory deficits, gait normal   LABORATORY DATA: I have reviewed the data as listed.  CBC    Component Value  Date/Time   WBC 4.8 08/01/2014 1256   RBC 4.10 08/01/2014 1256   HGB 13.0 08/01/2014 1256   HCT 37.7 08/01/2014 1256   PLT 210 08/01/2014 1256   MCV 92.0 08/01/2014 1256   MCH 31.7 08/01/2014 1256   MCHC 34.5 08/01/2014 1256   RDW 14.8 08/01/2014 1256   LYMPHSABS 1.6 05/01/2014 1050   MONOABS 0.3 05/01/2014 1050   EOSABS 0.1 05/01/2014 1050   BASOSABS 0.0 05/01/2014 1050      Chemistry      Component Value Date/Time   NA 139 04/30/2015 0836   K 3.8 04/30/2015 0836   CL 107 04/30/2015 0836   CO2 23 04/30/2015 0836   BUN 13 04/30/2015 0836   CREATININE 0.70 04/30/2015 0836   CREATININE 0.69 08/01/2014 1256      Component Value Date/Time   CALCIUM 9.3 04/30/2015 0836   ALKPHOS 63 08/01/2014 1256   AST 22 08/01/2014 1256   ALT 27 08/01/2014 1256   BILITOT 0.6 08/01/2014 1256     RADIOLOGY  No results found.  Study Result     CLINICAL DATA: The patient has a history of a previous left mastectomy for malignancy in 2000. There is a history of right breast lumpectomy followed by radiation therapy and chemotherapy in 2013. Follow-up evaluation.  EXAM: DIGITAL DIAGNOSTIC RIGHT MAMMOGRAM WITH 3D TOMOSYNTHESIS AND CAD  COMPARISON: Previous examinations including 12/10/2014, 12/22/2013, 12/21/2012, 12/16/2011.  ACR Breast Density Category a: The breast tissue is almost entirely fatty.  FINDINGS: There are stable post lumpectomy scarring changes located posteriorly and superiorly within the right breast. The right breast parenchymal pattern is stable. There is no specific evidence for recurrent tumor or developing malignancy.  Mammographic images were processed with CAD.  IMPRESSION: Stable right breast parenchymal pattern and no findings worrisome for recurrent tumor or developing malignancy.  RECOMMENDATION: Right breast diagnostic mammography in 1 year.  I have discussed the findings and recommendations with the patient. Results were also  provided in writing at the conclusion of the visit. If applicable, a reminder letter will be sent to the patient regarding the next appointment.  BI-RADS CATEGORY 1: Negative.   Electronically Signed  By: Altamese Cabal M.D.  On: 01/01/2015 15:08    ASSESSMENT:  1. 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 Fanny Skates on 02/13/2011 followed by sentinel node biopsy which was negative. Her Ki-67 marker was high at 54% HER-2/neu was nonamplified and she was ER/PR negative. She is now status post 6 cycles of carboplatin and Taxotere and S/P radiation. No evidence of recurrence.  2. BRCA1 positivity.  3.  History of left-sided breast cancer stage II grade 3 triple negative at that time as well with 7 negative nodes but a 2.3 cm primary with mastectomy on 11/27/1997. She was treated with AC. x4 cycles followed by Taxol x4 cycles thus far without recurrence.  4. Left chest wall mass below prior surgical incision site at 6 to 7 o clock position  PLAN:   Given the palpable abnormality on the L chest wall I have arranged for further evaluation, she is agreeable.  We again discussed appropriate surveillance of women with BRCA 1 positivity. I advised her that I only emphasize this to try to provide her with the best care.   A breast exam was performed during her appointment today.   She didn't go for her last breast MRI which was ordered; she cancelled last time. We'll make sure that, when she leaves today, she has her appointment, date and time in her hand. I certainly understand cost issues. We discussed mastectomy and reconstruction, I advised her that reconstruction is paid for by insurance, I have encouraged her to explore all of her options.   We will see her back in 6 months. Sooner of course if imaging of the chest wall lesion is suspicious.   Orders Placed This Encounter  Procedures  . US Breast Complete Uni Left Inc  Axilla    Standing Status:   Future    Number of Occurrences:   1    Standing Expiration Date:   10/16/2016    Order Specific Question:   Reason for Exam (SYMPTOM  OR DIAGNOSIS REQUIRED)    Answer:   palpable mass at approx 7 oclock position, 2 cm mobile. BRCA1 patient    Order Specific Question:   Preferred imaging location?    Answer:   [facility]  . MR Breast Bilateral W Wo Contrast    293 LBS/YES CLAUS/5'4/NO METAL EXPOSURE/PT HAD SURGERY ON BREAST/NO BRAIN EAR EYE OR HEART SURGERY/NO IMPLANTS/NO HTN/NO DIABETIC/NO KIDNEY OR LIVER DZ/NOT ALLERGIC TO CONTRAST/NO LUPUS OR RA/PT TAKES ALEVE EVERYDAY/NO NEEDS/NO PERIOD/PF: 08/20/15 BCG/INS-BCBS/CR/PT/AMY   7/21:  FAXED REQ FOR UPDATED ORDER//JTB    Standing Status:   Future    Number of Occurrences:   1    Standing Expiration Date:   10/23/2016    Order Specific Question:   If indicated for the ordered procedure, I authorize the administration of contrast media per Radiology protocol    Answer:   Yes    Order Specific Question:   Reason for Exam (SYMPTOM  OR DIAGNOSIS REQUIRED)    Answer:   BRCA1 mutation, history bilateral breast cancer    Order Specific Question:   Preferred imaging location?    Answer:   [facility] (table limit-550lbs)    Order Specific Question:   What is the patient's sedation requirement?    Answer:   No Sedation    Order Specific Question:   Does the patient have a pacemaker or implanted devices?    Answer:   No     All questions were answered. The patient knows to call the clinic with any problems, questions or concerns. We can certainly see the patient much sooner if necessary.   This document serves as a record of services personally performed by Ancil Linsey, MD. It was created on her behalf by Toni Amend, a trained medical scribe. The creation of this record is based on the scribe's personal observations and the provider's statements to them. This document has been  checked and  approved by the attending provider.  I have reviewed the above documentation for accuracy and completeness, and I agree with the above.  This note was signed electronically.  Molli Hazard, MD

## 2015-08-16 NOTE — Patient Instructions (Signed)
[facility] Discharge Instructions  RECOMMENDATIONS MADE BY THE CONSULTANT AND ANY TEST RESULTS WILL BE SENT TO YOUR REFERRING PHYSICIAN.  You saw Dr. Whitney Muse today. Return to clinic in 6 months. Breast MRI. U/S left chest wall next Tuesday, 08/20/15, for palpable mass.  Thank you for choosing [facility] at [facility] to provide your oncology and hematology care.  To afford each patient quality time with our provider, please arrive at least 15 minutes before your scheduled appointment time.   Beginning January 23rd 2017 lab work for the [facility] will be done in the  Main lab at [facility] on 1st floor. If you have a lab appointment with the [facility] please come in thru the  Main Entrance and check in at the main information desk  You need to re-schedule your appointment should you arrive 10 or more minutes late.  We strive to give you quality time with our providers, and arriving late affects you and other patients whose appointments are after yours.  Also, if you no show three or more times for appointments you may be dismissed from the clinic at the providers discretion.     Again, thank you for choosing [facility].  Our hope is that these requests will decrease the amount of time that you wait before being seen by our physicians.       _____________________________________________________________  Should you have questions after your visit to [facility], please contact our office at [phone] between the hours of 8:30 a.m. and 4:30 p.m.  Voicemails left after 4:30 p.m. will not be returned until the following business day.  For prescription refill requests, have your pharmacy contact our office.         Resources For Cancer Patients and their Caregivers ? American Cancer Society: Can assist with transportation, wigs, general needs, runs Look Good Feel Better.         367-353-8999 ? Cancer Care: Provides financial assistance, online support groups, medication/co-pay assistance.  1-800-813-HOPE 857 685 4703) ? [facility] Assists [place] cancer patients and their families through emotional , educational and financial support.  (360) 626-3334 ? [place] DSS Where to apply for food stamps, Medicaid and utility assistance. (819)092-0154 ? [facility]: Transportation to medical appointments. (202)697-0147 ? Social Security Administration: May apply for disability if have a Stage IV cancer. 208-628-2133 865-099-4473 ? [employer], Disability and Transit Services: Assists with nutrition, care and transit needs. [place] Support Programs: @10RELATIVEDAYS @ > Cancer Support Group  2nd Tuesday of the month 1pm-2pm, Journey Room  > Creative Journey  3rd Tuesday of the month 1130am-1pm, Journey Room  > Look Good Feel Better  1st Wednesday of the month 10am-12 noon, Journey Room (Call [facility] to register [phone])

## 2015-08-19 ENCOUNTER — Encounter (HOSPITAL_COMMUNITY)
Admission: RE | Disposition: A | Discharge status: Home / Self Care | Payer: Self-pay | Source: Ambulatory Visit | Attending: Gastroenterology

## 2015-08-19 ENCOUNTER — Ambulatory Visit (HOSPITAL_COMMUNITY)
Admission: RE | Admit: 2015-08-19 | Discharge: 2015-08-19 | Disposition: A | Discharge status: Home / Self Care | Payer: BLUE CROSS/BLUE SHIELD | Source: Ambulatory Visit | Attending: Gastroenterology | Admitting: Gastroenterology

## 2015-08-19 ENCOUNTER — Encounter (HOSPITAL_COMMUNITY): Payer: Self-pay | Admitting: *Deleted

## 2015-08-19 DIAGNOSIS — M1990 Unspecified osteoarthritis, unspecified site: Secondary | ICD-10-CM | POA: Insufficient documentation

## 2015-08-19 DIAGNOSIS — Z6841 Body Mass Index (BMI) 40.0 and over, adult: Secondary | ICD-10-CM | POA: Insufficient documentation

## 2015-08-19 DIAGNOSIS — Z1211 Encounter for screening for malignant neoplasm of colon: Secondary | ICD-10-CM | POA: Diagnosis not present

## 2015-08-19 DIAGNOSIS — Z800 Family history of malignant neoplasm of digestive organs: Secondary | ICD-10-CM

## 2015-08-19 DIAGNOSIS — D124 Benign neoplasm of descending colon: Secondary | ICD-10-CM | POA: Insufficient documentation

## 2015-08-19 DIAGNOSIS — Z7984 Long term (current) use of oral hypoglycemic drugs: Secondary | ICD-10-CM | POA: Insufficient documentation

## 2015-08-19 DIAGNOSIS — K644 Residual hemorrhoidal skin tags: Secondary | ICD-10-CM | POA: Diagnosis not present

## 2015-08-19 DIAGNOSIS — Q438 Other specified congenital malformations of intestine: Secondary | ICD-10-CM | POA: Diagnosis not present

## 2015-08-19 DIAGNOSIS — Z853 Personal history of malignant neoplasm of breast: Secondary | ICD-10-CM | POA: Diagnosis not present

## 2015-08-19 DIAGNOSIS — K648 Other hemorrhoids: Secondary | ICD-10-CM | POA: Diagnosis not present

## 2015-08-19 DIAGNOSIS — D122 Benign neoplasm of ascending colon: Secondary | ICD-10-CM | POA: Diagnosis not present

## 2015-08-19 DIAGNOSIS — E785 Hyperlipidemia, unspecified: Secondary | ICD-10-CM | POA: Insufficient documentation

## 2015-08-19 DIAGNOSIS — K635 Polyp of colon: Secondary | ICD-10-CM | POA: Diagnosis not present

## 2015-08-19 DIAGNOSIS — Z79899 Other long term (current) drug therapy: Secondary | ICD-10-CM | POA: Insufficient documentation

## 2015-08-19 DIAGNOSIS — K5730 Diverticulosis of large intestine without perforation or abscess without bleeding: Secondary | ICD-10-CM | POA: Diagnosis not present

## 2015-08-19 DIAGNOSIS — D123 Benign neoplasm of transverse colon: Secondary | ICD-10-CM | POA: Diagnosis not present

## 2015-08-19 HISTORY — PX: COLONOSCOPY: SHX5424

## 2015-08-19 LAB — GLUCOSE, CAPILLARY: GLUCOSE-CAPILLARY: 85 mg/dL (ref 65–99)

## 2015-08-19 SURGERY — COLONOSCOPY
Anesthesia: Moderate Sedation

## 2015-08-19 MED ORDER — PROMETHAZINE HCL 25 MG/ML IJ SOLN
INTRAMUSCULAR | Status: AC
  Administered 2015-08-19: 12.5 mg via INTRAVENOUS
  Filled 2015-08-19: qty 1

## 2015-08-19 MED ORDER — SODIUM CHLORIDE 0.9 % IV SOLN
INTRAVENOUS | Status: DC
  Administered 2015-08-19: 13:00:00 via INTRAVENOUS

## 2015-08-19 MED ORDER — MIDAZOLAM HCL 5 MG/5ML IJ SOLN
INTRAMUSCULAR | Status: DC | PRN
  Administered 2015-08-19: 1 mg via INTRAVENOUS
  Administered 2015-08-19 (×2): 2 mg via INTRAVENOUS

## 2015-08-19 MED ORDER — MEPERIDINE HCL 100 MG/ML IJ SOLN
INTRAMUSCULAR | Status: DC
Start: 2015-08-19 — End: 2015-08-19
  Filled 2015-08-19: qty 2

## 2015-08-19 MED ORDER — PROMETHAZINE HCL 25 MG/ML IJ SOLN
12.5000 mg | Freq: Once | INTRAMUSCULAR | Status: AC
  Administered 2015-08-19: 12.5 mg via INTRAVENOUS

## 2015-08-19 MED ORDER — MEPERIDINE HCL 100 MG/ML IJ SOLN
INTRAMUSCULAR | Status: DC | PRN
  Administered 2015-08-19 (×2): 25 mg via INTRAVENOUS

## 2015-08-19 MED ORDER — MIDAZOLAM HCL 5 MG/5ML IJ SOLN
INTRAMUSCULAR | Status: AC
  Filled 2015-08-19: qty 10

## 2015-08-19 MED ORDER — SODIUM CHLORIDE 0.9% FLUSH
INTRAVENOUS | Status: AC
  Administered 2015-08-19: 5 mL
  Filled 2015-08-19: qty 10

## 2015-08-19 NOTE — Discharge Instructions (Signed)
You have small internal AND MODERATE EXTERNAL hemorrhoids and diverticulosis IN YOUR LEFT COLON. YOU HAD FOUR SMALL POLYPS REMOVED.   DRINK WATER TO KEEP YOUR URINE LIGHT YELLOW.  CONTINUE YOUR WEIGHT LOSS EFFORTS.  FOLLOW A HIGH FIBER DIET. AVOID ITEMS THAT CAUSE BLOATING. See info below.  YOUR BIOPSY RESULTS WILL BE AVAILABLE IN MY CHART AFTER JUL 20 AND MY OFFICE WILL CONTACT YOU IN 10-14 DAYS WITH YOUR RESULTS.   Next colonoscopy in 3-5 years.  Colonoscopy Care After Read the instructions outlined below and refer to this sheet in the next week. These discharge instructions provide you with general information on caring for yourself after you leave the hospital. While your treatment has been planned according to the most current medical practices available, unavoidable complications occasionally occur. If you have any problems or questions after discharge, call DR. Media Pizzini, [phone].  ACTIVITY  You may resume your regular activity, but move at a slower pace for the next 24 hours.   Take frequent rest periods for the next 24 hours.   Walking will help get rid of the air and reduce the bloated feeling in your belly (abdomen).   No driving for 24 hours (because of the medicine (anesthesia) used during the test).   You may shower.   Do not sign any important legal documents or operate any machinery for 24 hours (because of the anesthesia used during the test).    NUTRITION  Drink plenty of fluids.   You may resume your normal diet as instructed by your doctor.   Begin with a light meal and progress to your normal diet. Heavy or fried foods are harder to digest and may make you feel sick to your stomach (nauseated).   Avoid alcoholic beverages for 24 hours or as instructed.    MEDICATIONS  You may resume your normal medications.   WHAT YOU CAN EXPECT TODAY  Some feelings of bloating in the abdomen.   Passage of more gas than usual.   Spotting of blood in your  stool or on the toilet paper  .  IF YOU HAD POLYPS REMOVED DURING THE COLONOSCOPY:  Eat a soft diet IF YOU HAVE NAUSEA, BLOATING, ABDOMINAL PAIN, OR VOMITING.    FINDING OUT THE RESULTS OF YOUR TEST Not all test results are available during your visit. DR. Oneida Alar WILL CALL YOU WITHIN 14 DAYS OF YOUR PROCEDUE WITH YOUR RESULTS. Do not assume everything is normal if you have not heard from DR. Aracelys Glade, CALL HER OFFICE AT [phone].  SEEK IMMEDIATE MEDICAL ATTENTION AND CALL THE OFFICE: [phone] IF:  You have more than a spotting of blood in your stool.   Your belly is swollen (abdominal distention).   You are nauseated or vomiting.   You have a temperature over 101F.   You have abdominal pain or discomfort that is severe or gets worse throughout the day.  High-Fiber Diet A high-fiber diet changes your normal diet to include more whole grains, legumes, fruits, and vegetables. Changes in the diet involve replacing refined carbohydrates with unrefined foods. The calorie level of the diet is essentially unchanged. The Dietary Reference Intake (recommended amount) for adult males is 38 grams per day. For adult females, it is 25 grams per day. Pregnant and lactating women should consume 28 grams of fiber per day. Fiber is the intact part of a plant that is not broken down during digestion. Functional fiber is fiber that has been isolated from the plant to provide a beneficial effect  in the body. PURPOSE  Increase stool bulk.   Ease and regulate bowel movements.   Lower cholesterol.  REDUCE RISK OF COLON CANCER  INDICATIONS THAT YOU NEED MORE FIBER  Constipation and hemorrhoids.   Uncomplicated diverticulosis (intestine condition) and irritable bowel syndrome.   Weight management.   As a protective measure against hardening of the arteries (atherosclerosis), diabetes, and cancer.   GUIDELINES FOR INCREASING FIBER IN THE DIET  Start adding fiber to the diet slowly. A  gradual increase of about 5 more grams (2 slices of whole-wheat bread, 2 servings of most fruits or vegetables, or 1 bowl of high-fiber cereal) per day is best. Too rapid an increase in fiber may result in constipation, flatulence, and bloating.   Drink enough water and fluids to keep your urine clear or pale yellow. Water, juice, or caffeine-free drinks are recommended. Not drinking enough fluid may cause constipation.   Eat a variety of high-fiber foods rather than one type of fiber.   Try to increase your intake of fiber through using high-fiber foods rather than fiber pills or supplements that contain small amounts of fiber.   The goal is to change the types of food eaten. Do not supplement your present diet with high-fiber foods, but replace foods in your present diet.   INCLUDE A VARIETY OF FIBER SOURCES  Replace refined and processed grains with whole grains, canned fruits with fresh fruits, and incorporate other fiber sources. White rice, white breads, and most bakery goods contain little or no fiber.   Brown whole-grain rice, buckwheat oats, and many fruits and vegetables are all good sources of fiber. These include: broccoli, Brussels sprouts, cabbage, cauliflower, beets, sweet potatoes, white potatoes (skin on), carrots, tomatoes, eggplant, squash, berries, fresh fruits, and dried fruits.   Cereals appear to be the richest source of fiber. Cereal fiber is found in whole grains and bran. Bran is the fiber-rich outer coat of cereal grain, which is largely removed in refining. In whole-grain cereals, the bran remains. In breakfast cereals, the largest amount of fiber is found in those with "bran" in their names. The fiber content is sometimes indicated on the label.   You may need to include additional fruits and vegetables each day.   In baking, for 1 cup white flour, you may use the following substitutions:   1 cup whole-wheat flour minus 2 tablespoons.   1/2 cup white flour plus  1/2 cup whole-wheat flour.   Polyps, Colon  A polyp is extra tissue that grows inside your body. Colon polyps grow in the large intestine. The large intestine, also called the colon, is part of your digestive system. It is a long, hollow tube at the end of your digestive tract where your body makes and stores stool. Most polyps are not dangerous. They are benign. This means they are not cancerous. But over time, some types of polyps can turn into cancer. Polyps that are smaller than a pea are usually not harmful. But larger polyps could someday become or may already be cancerous. To be safe, doctors remove all polyps and test them.   PREVENTION There is not one sure way to prevent polyps. You might be able to lower your risk of getting them if you:  Eat more fruits and vegetables and less fatty food.   Do not smoke.   Avoid alcohol.   Exercise every day.   Lose weight if you are overweight.   Eating more calcium and folate can also lower your  risk of getting polyps. Some foods that are rich in calcium are milk, cheese, and broccoli. Some foods that are rich in folate are chickpeas, kidney beans, and spinach.    Diverticulosis Diverticulosis is a common condition that develops when small pouches (diverticula) form in the wall of the colon. The risk of diverticulosis increases with age. It happens more often in people who eat a low-fiber diet. Most individuals with diverticulosis have no symptoms. Those individuals with symptoms usually experience belly (abdominal) pain, constipation, or loose stools (diarrhea).  HOME CARE INSTRUCTIONS  Increase the amount of fiber in your diet as directed by your caregiver or dietician. This may reduce symptoms of diverticulosis.   Drink at least 6 to 8 glasses of water each day to prevent constipation.   Try not to strain when you have a bowel movement.   Avoiding nuts and seeds to prevent complications is NOT NECESSARY.    FOODS HAVING HIGH FIBER  CONTENT INCLUDE:  Fruits. Apple, peach, pear, tangerine, raisins, prunes.   Vegetables. Brussels sprouts, asparagus, broccoli, cabbage, carrot, cauliflower, romaine lettuce, spinach, summer squash, tomato, winter squash, zucchini.   Starchy Vegetables. Baked beans, kidney beans, lima beans, split peas, lentils, potatoes (with skin).   Grains. Whole wheat bread, brown rice, bran flake cereal, plain oatmeal, white rice, shredded wheat, bran muffins.    SEEK IMMEDIATE MEDICAL CARE IF:  You develop increasing pain or severe bloating.   You have an oral temperature above 101F.   You develop vomiting or bowel movements that are bloody or black.   Hemorrhoids Hemorrhoids are dilated (enlarged) veins around the rectum. Sometimes clots will form in the veins. This makes them swollen and painful. These are called thrombosed hemorrhoids. Causes of hemorrhoids include:  Constipation.   Straining to have a bowel movement.  HEAVY LIFTING  HOME CARE INSTRUCTIONS  Eat a well balanced diet and drink 6 to 8 glasses of water every day to avoid constipation. You may also use a bulk laxative.   Avoid straining to have bowel movements.   Keep anal area dry and clean.   Do not use a donut shaped pillow or sit on the toilet for long periods. This increases blood pooling and pain.   Move your bowels when your body has the urge; this will require less straining and will decrease pain and pressure.

## 2015-08-19 NOTE — Op Note (Signed)
[facility] Patient Name: Kathleen Reynolds Procedure Date: 08/19/2015 1:16 PM MRN: [id number] Date of Birth: Apr 02, 1960 Attending MD: Barney Drain , MD CSN: [id number] Age: 55 Admit Type: Outpatient Procedure:                Colonoscopy WITH COLD FORCEPS POLYPECTOMY/COLOWRAP Indications:              Screening in patient at increased risk: Family                            history of 1st-degree relative with colorectal                            cancer before age 24 years Providers:                Barney Drain, MD, Janeece Riggers, RN, Isabella Stalling,                            Technician Referring MD:             Norwood Levo. Simpson MD, MD Medicines:                Promethazine 12.5 mg IV, Meperidine 50 mg IV,                            Midazolam 4 mg IV Complications:            No immediate complications. Estimated Blood Loss:     Estimated blood loss was minimal. Procedure:                Pre-Anesthesia Assessment:                           - Prior to the procedure, a History and Physical                            was performed, and patient medications and                            allergies were reviewed. The patient's tolerance of                            previous anesthesia was also reviewed. The risks                            and benefits of the procedure and the sedation                            options and risks were discussed with the patient.                            All questions were answered, and informed consent                            was obtained. Prior Anticoagulants: The patient has  taken no previous anticoagulant or antiplatelet                            agents. ASA Grade Assessment: II - A patient with                            mild systemic disease. After reviewing the risks                            and benefits, the patient was deemed in                            satisfactory condition to undergo the procedure.                           After obtaining informed consent, the colonoscope                            was passed under direct vision. Throughout the                            procedure, the patient's blood pressure, pulse, and                            oxygen saturations were monitored continuously. The                            [id number] scope was introduced through                            the anus and advanced to the the cecum, identified                            by appendiceal orifice and ileocecal valve. The                            ileocecal valve, appendiceal orifice, and rectum                            were photographed. The colonoscopy was performed                            without difficulty. The patient tolerated the                            procedure well. The quality of the bowel                            preparation was excellent. Scope In: 1:37:12 PM Scope Out: 1:57:21 PM Scope Withdrawal Time: 0 hours 15 minutes 32 seconds  Total Procedure Duration: 0 hours 20 minutes 9 seconds  Findings:      Four sessile polyps were found in the descending colon, hepatic flexure       and ascending colon. The polyps were 2  to 4 mm in size. These polyps       were removed with a cold biopsy forceps. Resection and retrieval were       complete.      A few small-mouthed diverticula were found in the sigmoid colon,       descending colon and distal transverse colon.      Non-bleeding external and internal hemorrhoids were found.      The recto-sigmoid colon and sigmoid colon were mildly redundant. AND       COLOWRAP. Impression:               - Four 2 to 4 mm polyps in the descending colon, at                            the hepatic flexure and in the ascending colon(2),                            removed with a cold biopsy forceps.                           - Diverticulosis in the sigmoid colon, in the                            descending colon and in the distal  transverse colon.                           - Non-bleeding external and internal hemorrhoids.                           - SLIGHTLY Redundant left colon. Moderate Sedation:      Moderate (conscious) sedation was administered by the endoscopy nurse       and supervised by the endoscopist. The following parameters were       monitored: oxygen saturation, heart rate, blood pressure, and response       to care. Total physician intraservice time was 34 minutes. Recommendation:           - High fiber diet.                           - Continue present medications.                           - Await pathology results.                           - Repeat colonoscopy in 3 - 5 years for                            surveillance.                           - Patient has a contact number available for                            emergencies. The signs and symptoms of potential  delayed complications were discussed with the                            patient. Return to normal activities tomorrow.                            Written discharge instructions were provided to the                            patient. Procedure Code(s):        --- Professional ---                           [phone], Colonoscopy, flexible; with biopsy, single                            or multiple                           99152, Moderate sedation services provided by the                            same physician or other qualified health care                            professional performing the diagnostic or                            therapeutic service that the sedation supports,                            requiring the presence of an independent trained                            observer to assist in the monitoring of the                            patient's level of consciousness and physiological                            status; initial 15 minutes of intraservice time,                             patient age 53 years or older                           [phone], Moderate sedation services; each additional                            15 minutes intraservice time Diagnosis Code(s):        --- Professional ---                           Z80.0, Family history of malignant neoplasm of  digestive organs                           D12.4, Benign neoplasm of descending colon                           D12.3, Benign neoplasm of transverse colon (hepatic                            flexure or splenic flexure)                           D12.2, Benign neoplasm of ascending colon                           K64.8, Other hemorrhoids                           K57.30, Diverticulosis of large intestine without                            perforation or abscess without bleeding                           Q43.8, Other specified congenital malformations of                            intestine CPT copyright 2016 American Medical Association. All rights reserved. The codes documented in this report are preliminary and upon coder review may  be revised to meet current compliance requirements. Barney Drain, MD Barney Drain, MD 08/19/2015 2:06:54 PM This report has been signed electronically. Number of Addenda: 0

## 2015-08-19 NOTE — H&P (View-Only) (Signed)
Subjective:    Patient ID: Halayah Rappe, female    DOB: [birth year], 55 y.o.   MRN: [id number]  [name], MD  HPI Working on weight loss. HAVING TROUBLE WITH ACHILLES TENDINITIS. PT DENIES FEVER, CHILLS, HEMATOCHEZIA, HEMATEMESIS, nausea, vomiting, melena, diarrhea, CHEST PAIN, SHORTNESS OF BREATH,  CHANGE IN BOWEL IN HABITS, constipation, abdominal pain, problems swallowing, problems with sedation, heartburn or indigestion.   Past Medical History  Diagnosis Date  . Migraines   . Breast mass in female     right  . Arthritis   . Hyperlipidemia   . Breast cancer ([place])     Right  . Left rotator cuff tear   . Morbid obesity ([place])   . Anemia LIFELONG    HEAVY MENSES MAR 2012 HB 13 MCV 86.7 FERRITIN  66  . 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 RUPTURE ROTATOR CUFF 06/08/2007    Qualifier: Diagnosis of  By: Aline Brochure MD, Dorothyann Peng    . Urticaria     Past Surgical History  Procedure Laterality Date  . Dilation and curettage of uterus    . Colonoscopy  2004 DR. SMITH    No polyp, hemorrhoids  . Colonoscopy  06/09/10    [id number] adenomas   . Hystersonogram  2009  . Dilation and curettage of uterus  05/2002  . Mastectomy  1999    left breast cancer   . Breast surgery  11/1997    Left breast cancer-mastectomy  . Breast biopsy  02/13/2011    Procedure: BREAST BIOPSY WITH NEEDLE LOCALIZATION;  Surgeon: Adin Hector, MD;  Location: [facility];  Service: General;  Laterality: Right;  . Breast surgery  2/13-    rt axilly bx  . Portacath placement  03/27/2011    Procedure: INSERTION PORT-A-CATH;  Surgeon: Adin Hector, MD;  Location: [facility];  Service: General;  Laterality: Left;  insert port a cath  . Port-a-cath removal  10/20/2011    Procedure: MINOR REMOVAL PORT-A-CATH;  Surgeon: Adin Hector, MD;  Location: [facility];  Service: General;  Laterality: N/A;  Porta-cath removal  left   Allergies  Allergen Reactions  .  Nsaids Anaphylaxis    Ibuprofen required hospitalization in 2016 due to ibuprofen  . Ibuprofen Itching   Current Outpatient Prescriptions  Medication Sig Dispense Refill  . FIORICET, ESGIC 50-325-40 MG tablet Take 1 tablet by mouth 2 (two) times daily as needed. For migraine    . calcium-vitamin D (OSCAL WITH D 500-200 MG-UNIT per tablet Take 1 tablet by mouth.    . DiphenhydrAMINE HCl (BENADRYL ALLERGY PO) Take by mouth.    . EPINEPHrine 0.3 mg/0.3 mL IJ SOAJ injection Inject 0.3 mLs (0.3 mg total) into the muscle once.    . Flaxseed, Linseed, (FLAXSEED OIL) 1000 MG CAPS Take 2,000 mg by mouth daily. Reported on 03/19/2015    . fluticasone (FLONASE) 50 MCG/ACT nasal spray Place 2 sprays into both nostrils daily.    . meclizine (ANTIVERT) 25 MG tablet Take 1 tablet (25 mg total) by mouth 3 (three) times daily as needed. For dizziness    . metFORMIN (GLUCOPHAGE) 500 MG tablet Take 1 tablet (500 mg total) by mouth 2 (two) times daily with a meal.    . Multiple Vitamin (MULITIVITAMIN WITH MINERALS) TABS Take 1 tablet by mouth daily.    .      .       Review of Systems PER HPI OTHERWISE ALL SYSTEMS ARE NEGATIVE.  Objective:   Physical Exam  Constitutional: She is oriented to person, place, and time. She appears well-developed and well-nourished. No distress.  HENT:  Head: Normocephalic and atraumatic.  Mouth/Throat: Oropharynx is clear and moist. No oropharyngeal exudate.  Eyes: Pupils are equal, round, and reactive to light. No scleral icterus.  Neck: Normal range of motion. Neck supple.  Cardiovascular: Normal rate, regular rhythm and normal heart sounds.   Pulmonary/Chest: Effort normal and breath sounds normal. No respiratory distress.  Abdominal: Soft. Bowel sounds are normal. She exhibits no distension. There is no tenderness.  Musculoskeletal: She exhibits no edema.  Lymphadenopathy:    She has no cervical adenopathy.  Neurological: She is alert and oriented to person, place,  and time.  Psychiatric: She has a normal mood and affect.  Vitals reviewed.     Assessment & Plan:

## 2015-08-19 NOTE — Interval H&P Note (Signed)
History and Physical Interval Note:  08/19/2015 12:58 PM  Kathleen Reynolds  has presented today for surgery, with the diagnosis of SCREENING  The various methods of treatment have been discussed with the patient and family. After consideration of risks, benefits and other options for treatment, the patient has consented to  Procedure(s) with comments: COLONOSCOPY (N/A) - 130 - moved to 12:45 - Ginger notified pt as a surgical intervention .  The patient's history has been reviewed, patient examined, no change in status, stable for surgery.  I have reviewed the patient's chart and labs.  Questions were answered to the patient's satisfaction.     [employer]

## 2015-08-20 ENCOUNTER — Ambulatory Visit
Admission: RE | Admit: 2015-08-20 | Discharge: 2015-08-20 | Disposition: A | Discharge status: Home / Self Care | Payer: BLUE CROSS/BLUE SHIELD | Source: Ambulatory Visit | Attending: Hematology & Oncology | Admitting: Hematology & Oncology

## 2015-08-20 ENCOUNTER — Ambulatory Visit (HOSPITAL_COMMUNITY): Payer: Self-pay | Admitting: Hematology & Oncology

## 2015-08-20 DIAGNOSIS — C50912 Malignant neoplasm of unspecified site of left female breast: Secondary | ICD-10-CM

## 2015-08-20 DIAGNOSIS — C50911 Malignant neoplasm of unspecified site of right female breast: Secondary | ICD-10-CM

## 2015-08-20 DIAGNOSIS — Z853 Personal history of malignant neoplasm of breast: Secondary | ICD-10-CM

## 2015-08-20 DIAGNOSIS — Z1501 Genetic susceptibility to malignant neoplasm of breast: Secondary | ICD-10-CM

## 2015-08-20 DIAGNOSIS — Z1509 Genetic susceptibility to other malignant neoplasm: Principal | ICD-10-CM

## 2015-08-22 ENCOUNTER — Encounter (HOSPITAL_COMMUNITY): Payer: Self-pay | Admitting: Gastroenterology

## 2015-08-26 ENCOUNTER — Telehealth (HOSPITAL_COMMUNITY): Payer: Self-pay | Admitting: Oncology

## 2015-08-26 NOTE — Telephone Encounter (Signed)
Peer to peer is completed for MRI of breast. Approval is as follows: [id number]  Robynn Pane, PA-C 08/26/2015 4:17 PM

## 2015-08-28 ENCOUNTER — Ambulatory Visit
Admission: RE | Admit: 2015-08-28 | Discharge: 2015-08-28 | Disposition: A | Discharge status: Home / Self Care | Payer: BLUE CROSS/BLUE SHIELD | Source: Ambulatory Visit | Attending: Hematology & Oncology | Admitting: Hematology & Oncology

## 2015-08-28 DIAGNOSIS — C50911 Malignant neoplasm of unspecified site of right female breast: Secondary | ICD-10-CM

## 2015-08-28 DIAGNOSIS — Z1509 Genetic susceptibility to other malignant neoplasm: Secondary | ICD-10-CM

## 2015-08-28 DIAGNOSIS — C50912 Malignant neoplasm of unspecified site of left female breast: Secondary | ICD-10-CM

## 2015-08-28 DIAGNOSIS — Z1501 Genetic susceptibility to malignant neoplasm of breast: Secondary | ICD-10-CM

## 2015-08-28 MED ORDER — GADOBENATE DIMEGLUMINE 529 MG/ML IV SOLN
20.0000 mL | Freq: Once | INTRAVENOUS | Status: AC | PRN
  Administered 2015-08-28: 20 mL via INTRAVENOUS

## 2015-08-29 ENCOUNTER — Other Ambulatory Visit: Payer: Self-pay | Admitting: *Deleted

## 2015-08-29 ENCOUNTER — Encounter: Payer: Self-pay | Admitting: Family Medicine

## 2015-08-29 ENCOUNTER — Ambulatory Visit (INDEPENDENT_AMBULATORY_CARE_PROVIDER_SITE_OTHER): Payer: BLUE CROSS/BLUE SHIELD | Admitting: Family Medicine

## 2015-08-29 VITALS — BP 104/62 | HR 88 | Resp 18 | Ht 64.0 in | Wt 289.0 lb

## 2015-08-29 DIAGNOSIS — R7303 Prediabetes: Secondary | ICD-10-CM | POA: Diagnosis not present

## 2015-08-29 DIAGNOSIS — F418 Other specified anxiety disorders: Secondary | ICD-10-CM | POA: Diagnosis not present

## 2015-08-29 DIAGNOSIS — N63 Unspecified lump in unspecified breast: Secondary | ICD-10-CM | POA: Insufficient documentation

## 2015-08-29 NOTE — Progress Notes (Signed)
Kathleen Reynolds     MRN: [id number]      DOB: 1960-07-14   HPI Kathleen Reynolds is here with main concern regarding recent new finding of mobile left breast lump. This has been  Evaluated both by her oncologist and the radiologist who recommends surgical consult , despite the fact that the most likely pathology is benign. 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 States she has also been recently considering prophylactic right mastectomy primarily due to high cost of MRI imaging every 6 months. Already reports positive encouragement from oncology with this decision, but wants ,mine alos , she is confused and upset. States that no negative effect on her relationship with her partner with unilateral mastectomy  ROS Denies recent fever or chills. Denies sinus pressure, nasal congestion, ear pain or sore throat. Denies chest congestion, productive cough or wheezing. Denies chest pains, palpitations and leg swelling Denies abdominal pain, nausea, vomiting,diarrhea or constipation.   Denies dysuria, frequency, hesitancy or incontinence. Denies uncontrolledjoint pain, swelling and limitation in mobility. Denies headaches, seizures, numbness, or tingling.     PE  BP 104/62 (BP Location: Right Arm, Patient Position: Sitting, Cuff Size: Large)   Pulse 88   Resp 18   Ht 5\' 4"  (1.626 m)   Wt 289 lb (131.1 kg)   LMP 03/27/2011   SpO2 98%   BMI 49.61 kg/m   Patient alert and oriented and in no cardiopulmonary distress.  HEENT: No facial asymmetry, EOMI,   oropharynx pink and moist.  Neck supple no JVD, no mass.  Chest: Clear to auscultation bilaterally. Breast: approx 3 cm mobile left breast mass, near sternal border a around 5 o' clock. Right breast exam negative for mass, nipple d/c or inversion, no axillary or supraclavicular adenopathy CVS: S1, S2 no murmurs, no S3.Regular  rate.  ABD: Soft non tender.   Ext: No edema  MS: Adequate ROM spine, shoulders, hips and knees.  Skin: Intact, no ulcerations or rash noted.  Psych: Good eye contact, normal affect. Memory intact  anxious mildly tearful and  depressed appearing.  CNS: CN 2-12 intact, power,  normal throughout.no focal deficits noted.   Assessment & Plan  Mass of breast New right breast  Lump,I encouraged removal,  as she has had 2 occurrences of breast cancer left breast mass, has appt next week with surgeon,  d   Depression with anxiety Increased symptoms with new breast lump. Allowed to ventilate, no change in management, not suicidal or homicidal  Obesity, morbid, BMI 40.0-49.9 ([place]) Improved. Patient re-educated about  the importance of commitment to a  minimum of 150 minutes of exercise per week.  The importance of healthy food choices with portion control discussed. Encouraged to start a food diary, count calories and to consider  joining a support group. Sample diet sheets offered. Goals set by the patient for the next several months.   Weight /BMI 08/29/2015 08/28/2015 08/19/2015  WEIGHT 289 lb 289 lb 292 lb  HEIGHT 5\' 4"  - 5\' 4"   BMI 49.61 kg/m2 49.61 kg/m2 50.1 kg/m2      Prediabetes Patient educated about the importance of limiting  Carbohydrate intake , the need to commit to daily physical activity for a minimum of 30 minutes , and to commit weight loss. The fact that changes in all these areas will reduce or eliminate all together the development of diabetes  is stressed.  Updated lab needed at/ before next visit.   Diabetic Labs Latest Ref Rng & Units 04/30/2015 08/01/2014 07/31/2014 05/01/2014 12/18/2013  HbA1c <5.7 % 6.1(H) - 6.1(H) - 5.9(H)  Chol 0 - 200 mg/dL - - 211(H) - 201(H)  HDL >=46 mg/dL - - 63 - 57  Calc LDL 0 - 99 mg/dL - - 121(H) - 116(H)  Triglycerides <150 mg/dL - - 137 - 138  Creatinine 0.50 - 1.05 mg/dL 0.70 0.69 - 0.65 0.75   BP/Weight 08/29/2015 08/28/2015  08/19/2015 08/16/2015 07/25/2015 05/07/2015 [id number]  Systolic BP 123456 - [id number] 123456 [id number] -  Diastolic BP 62 - 74 71 79 80 -  Wt. (Lbs) 289 289 292 292.2 288.4 293.4 292.8  BMI 49.61 49.61 50.1 50.13 49.48 45.94 45.85   No flowsheet data found.

## 2015-08-29 NOTE — Assessment & Plan Note (Signed)
Improved. Patient re-educated about  the importance of commitment to a  minimum of 150 minutes of exercise per week.  The importance of healthy food choices with portion control discussed. Encouraged to start a food diary, count calories and to consider  joining a support group. Sample diet sheets offered. Goals set by the patient for the next several months.   Weight /BMI 08/29/2015 08/28/2015 08/19/2015  WEIGHT 289 lb 289 lb 292 lb  HEIGHT 5\' 4"  - 5\' 4"   BMI 49.61 kg/m2 49.61 kg/m2 50.1 kg/m2

## 2015-08-29 NOTE — Assessment & Plan Note (Signed)
Increased symptoms with new breast lump. Allowed to ventilate, no change in management, not suicidal or homicidal

## 2015-08-29 NOTE — Assessment & Plan Note (Signed)
New right breast  Lump,I encouraged removal,  as she has had 2 occurrences of breast cancer left breast mass, has appt next week with surgeon,  d

## 2015-08-29 NOTE — Patient Instructions (Signed)
F/u as before, call if you need me sooner  I DO recommend surgical consultation and also discuss right mastectomy , for reasons we have discussed and possibly also removing the new breast lump on left.  Be as honest and open regarding all your concerns that you have voiced with me  , with youer surgeon, including cost of repeat testing , and your clinical condition  Thank you  for choosing [facility]. We consider it a privelige to serve you.  Delivering excellent health care in a caring and  compassionate way is our goal.  Partnering with you,  so that together we can achieve this goal is our strategy.

## 2015-08-29 NOTE — Assessment & Plan Note (Signed)
Patient educated about the importance of limiting  Carbohydrate intake , the need to commit to daily physical activity for a minimum of 30 minutes , and to commit weight loss. The fact that changes in all these areas will reduce or eliminate all together the development of diabetes is stressed.  Updated lab needed at/ before next visit.   Diabetic Labs Latest Ref Rng & Units 04/30/2015 08/01/2014 07/31/2014 05/01/2014 12/18/2013  HbA1c <5.7 % 6.1(H) - 6.1(H) - 5.9(H)  Chol 0 - 200 mg/dL - - 211(H) - 201(H)  HDL >=46 mg/dL - - 63 - 57  Calc LDL 0 - 99 mg/dL - - 121(H) - 116(H)  Triglycerides <150 mg/dL - - 137 - 138  Creatinine 0.50 - 1.05 mg/dL 0.70 0.69 - 0.65 0.75   BP/Weight 08/29/2015 08/28/2015 08/19/2015 08/16/2015 07/25/2015 05/07/2015 [id number]  Systolic BP 123456 - [id number] 123456 [id number] -  Diastolic BP 62 - 74 71 79 80 -  Wt. (Lbs) 289 289 292 292.2 288.4 293.4 292.8  BMI 49.61 49.61 50.1 50.13 49.48 45.94 45.85   No flowsheet data found.

## 2015-08-30 NOTE — Telephone Encounter (Signed)
error 

## 2015-08-31 ENCOUNTER — Telehealth: Payer: Self-pay | Admitting: Gastroenterology

## 2015-08-31 NOTE — Telephone Encounter (Signed)
Please call pt. She had THREE simple adenomas AND ONE HYPERPLASTIC POLYP removed.   DRINK WATER TO KEEP YOUR URINE LIGHT YELLOW.  CONTINUE YOUR WEIGHT LOSS EFFORTS.  FOLLOW A HIGH FIBER DIET. AVOID ITEMS THAT CAUSE BLOATING.   Next colonoscopy in 3 years.

## 2015-09-02 ENCOUNTER — Ambulatory Visit: Payer: BLUE CROSS/BLUE SHIELD | Admitting: Orthopedic Surgery

## 2015-09-02 NOTE — Telephone Encounter (Signed)
Reminder in epic °

## 2015-09-02 NOTE — Telephone Encounter (Signed)
Tried to call with no answer  

## 2015-09-05 ENCOUNTER — Ambulatory Visit: Payer: Self-pay | Admitting: Family Medicine

## 2015-09-09 ENCOUNTER — Ambulatory Visit: Payer: Self-pay | Admitting: Orthopedic Surgery

## 2015-09-15 ENCOUNTER — Encounter (HOSPITAL_COMMUNITY): Payer: Self-pay | Admitting: Hematology & Oncology

## 2015-09-16 ENCOUNTER — Ambulatory Visit: Payer: Self-pay | Admitting: Orthopedic Surgery

## 2015-09-26 ENCOUNTER — Ambulatory Visit: Payer: Self-pay | Admitting: Family Medicine

## 2015-10-03 ENCOUNTER — Ambulatory Visit (INDEPENDENT_AMBULATORY_CARE_PROVIDER_SITE_OTHER): Payer: BLUE CROSS/BLUE SHIELD | Admitting: Orthopedic Surgery

## 2015-10-03 ENCOUNTER — Encounter: Payer: Self-pay | Admitting: Orthopedic Surgery

## 2015-10-03 ENCOUNTER — Ambulatory Visit (INDEPENDENT_AMBULATORY_CARE_PROVIDER_SITE_OTHER): Payer: BLUE CROSS/BLUE SHIELD

## 2015-10-03 VITALS — BP 114/66 | HR 83 | Ht 64.5 in | Wt 289.0 lb

## 2015-10-03 DIAGNOSIS — M25562 Pain in left knee: Secondary | ICD-10-CM

## 2015-10-03 DIAGNOSIS — M545 Low back pain, unspecified: Secondary | ICD-10-CM

## 2015-10-03 DIAGNOSIS — M76829 Posterior tibial tendinitis, unspecified leg: Secondary | ICD-10-CM

## 2015-10-03 DIAGNOSIS — M6789 Other specified disorders of synovium and tendon, multiple sites: Secondary | ICD-10-CM | POA: Diagnosis not present

## 2015-10-03 DIAGNOSIS — M1712 Unilateral primary osteoarthritis, left knee: Secondary | ICD-10-CM

## 2015-10-03 DIAGNOSIS — M129 Arthropathy, unspecified: Secondary | ICD-10-CM | POA: Diagnosis not present

## 2015-10-03 DIAGNOSIS — M79605 Pain in left leg: Secondary | ICD-10-CM

## 2015-10-03 MED ORDER — TRAMADOL-ACETAMINOPHEN 37.5-325 MG PO TABS: 1.0000 | ORAL_TABLET | Freq: Four times a day (QID) | ORAL | 5 refills | Status: DC | PRN

## 2015-10-03 NOTE — Patient Instructions (Addendum)
You have received an injection of steroids into the joint. 15% of patients will have increased pain within the 24 hours postinjection.   This is transient and will go away.   We recommend that you use ice packs on the injection site for 20 minutes every 2 hours and extra strength Tylenol 2 tablets every 8 as needed until the pain resolves.  If you continue to have pain after taking the Tylenol and using the ice please call the office for further instructions.   ICE THE KNEE AFTER ACTIVITY  TAKE ULTRACET FOR PAIN   FOOT ORTHOTICS/UCBL ORTHOSIS FROM [facility]: 1 [phone]

## 2015-10-03 NOTE — Progress Notes (Signed)
Chief Complaint  Patient presents with  . Knee Pain    LEFT KNEE PAIN   HPI 55 year old female comes in with several complaints primarily #1 his left knee pain  #2 bilateral foot pain lateral subtalar joint  #3 back pain with occasional posterior knee pain  As far as her left knee pain goes: She complains of several week history of medial knee pain with occasional swelling and occasional giving way. No trauma. Pain is moderate and interferes with activities of daily living such as getting up and going downstairs  She has had no treatment   Review of Systems  Constitutional: Negative for fever.  Respiratory: Negative for shortness of breath.   Cardiovascular: Negative for chest pain.  Musculoskeletal: Positive for back pain.    Past Medical History:  Diagnosis Date  . Anemia LIFELONG   HEAVY MENSES MAR 2012 HB 13 MCV 86.7 FERRITIN  66  . Arthritis   . Breast cancer ([place])    Right  . Breast mass in female    right  . 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 Hyperlipidemia   . Left rotator cuff tear   . Migraines   . Morbid obesity ([place])   . RUPTURE ROTATOR CUFF 06/08/2007   Qualifier: Diagnosis of  By: Aline Brochure MD, Dorothyann Peng    . Urticaria     Past Surgical History:  Procedure Laterality Date  . BREAST BIOPSY  02/13/2011   Procedure: BREAST BIOPSY WITH NEEDLE LOCALIZATION;  Surgeon: Adin Hector, MD;  Location: [facility];  Service: General;  Laterality: Right;  . BREAST SURGERY  11/1997   Left breast cancer-mastectomy  . BREAST SURGERY  2/13-   rt axilly bx  . COLONOSCOPY  2004 DR. SMITH   No polyp, hemorrhoids  . COLONOSCOPY  06/09/10   [id number] adenomas   . COLONOSCOPY N/A 08/19/2015   Procedure: COLONOSCOPY;  Surgeon: Danie Binder, MD;  [facility];  Service: Endoscopy;  Laterality: N/A;  130 - moved to 12:45 - Ginger notified pt  . DILATION AND CURETTAGE OF UTERUS    . DILATION AND CURETTAGE OF UTERUS  05/2002  . hystersonogram  2009  .  MASTECTOMY  1999   left breast cancer   . PORT-A-CATH REMOVAL  10/20/2011   Procedure: MINOR REMOVAL PORT-A-CATH;  Surgeon: Adin Hector, MD;  Location: [facility];  Service: General;  Laterality: N/A;  Porta-cath removal  left  . PORTACATH PLACEMENT  03/27/2011   Procedure: INSERTION PORT-A-CATH;  Surgeon: Adin Hector, MD;  Location: [facility];  Service: General;  Laterality: Left;  insert port a cath   Family History  Problem Relation Age of Onset  . Hypertension Mother   . Cancer Father     lung cancer  . Diabetes Sister   . Cancer Brother     colon cancer  . Heart disease Sister     heart attack  . Asthma Sister   . Eczema Sister   . Allergic rhinitis Neg Hx   . Urticaria Neg Hx    Social History  Substance Use Topics  . Smoking status: Never Smoker  . Smokeless tobacco: Never Used  . Alcohol use 1.0 oz/week    2 drink(s) per week     Comment: occasional    Current Outpatient Prescriptions:  .  butalbital-acetaminophen-caffeine (FIORICET, ESGIC) 50-325-40 MG tablet, Take 1 tablet by mouth 2 (two) times daily as needed. For migraine, Disp: 14 tablet, Rfl: 3 .  EPINEPHrine 0.3 mg/0.3 mL IJ SOAJ injection, Inject 0.3 mLs (0.3 mg total) into the muscle once., Disp: 2 Device, Rfl: 1 .  fluticasone (FLONASE) 50 MCG/ACT nasal spray, Place 2 sprays into both nostrils daily. (Patient taking differently: Place 2 sprays into both nostrils daily as needed for allergies. ), Disp: 16 g, Rfl: 4 .  meclizine (ANTIVERT) 25 MG tablet, Take 1 tablet (25 mg total) by mouth 3 (three) times daily as needed. For dizziness, Disp: 30 tablet, Rfl: 3 .  metFORMIN (GLUCOPHAGE) 500 MG tablet, TAKE ONE TABLET BY MOUTH TWICE DAILY WITH  A  MEAL, Disp: 60 tablet, Rfl: 2 .  Multiple Vitamin (MULITIVITAMIN WITH MINERALS) TABS, Take 1 tablet by mouth daily., Disp: , Rfl:   BP 114/66   Pulse 83   Ht 5' 4.5" (1.638 m)   Wt 289 lb (131.1 kg)   LMP 03/27/2011   BMI  48.84 kg/m   Physical Exam  Constitutional: She is oriented to person, place, and time. She appears well-developed and well-nourished. No distress.  She does have moderate obesity  Cardiovascular: Normal rate and intact distal pulses.   Musculoskeletal:  She does ambulate without assistive devices and there is no limping involved  She has tenderness in the lower back at L5-S1 L4-L5 and also in the left SI joint and left gluteal area. She has a negative straight leg raising each leg.  She has bilateral pes planus which is flexible but she does have a lateral subtalar joint pain. Swelling in that area.  Neurological: She is alert and oriented to person, place, and time. She has normal reflexes. She exhibits normal muscle tone. Coordination normal.  Skin: Skin is warm and dry. No rash noted. She is not diaphoretic. No erythema. No pallor.  Psychiatric: She has a normal mood and affect. Her behavior is normal. Judgment and thought content normal.    Right Knee Exam   Tenderness  The patient is experiencing tenderness in the medial joint line.  Range of Motion  Extension: normal  Flexion:  120 normal   Muscle Strength   The patient has normal right knee strength.  Tests  McMurray:  Medial - negative Lateral - negative Drawer:       Anterior - negative    Posterior - negative Varus: negative Valgus: negative  Other  Erythema: absent Scars: absent Sensation: normal Pulse: present Swelling: none   Left Knee Exam   Tenderness  The patient is experiencing tenderness in the medial joint line.  Range of Motion  Extension: normal  Flexion:  120 normal   Muscle Strength   The patient has normal left knee strength.  Tests  McMurray:  Medial - negative Lateral - negative Drawer:       Anterior - negative     Posterior - negative Varus: negative Valgus: negative  Other  Erythema: absent Scars: absent Sensation: normal Pulse: present Swelling:  none       ASSESSMENT: My personal interpretation of the images:  Plain films show moderate to severe arthritis of the knee  Encounter Diagnoses  Name Primary?  . Left knee pain   . Arthritis of left knee Yes  . PTTD (posterior tibial tendon dysfunction)   . Low back pain radiating to left lower extremity      PLAN Arthritis left knee inject left knee  Procedure note left knee injection verbal consent was obtained to inject left knee joint  Timeout was completed to confirm the site of injection  The  medications used were 40 mg of Depo-Medrol and 1% lidocaine 3 cc  Anesthesia was provided by ethyl chloride and the skin was prepped with alcohol.  After cleaning the skin with alcohol a 20-gauge needle was used to inject the left knee joint. There were no complications. A sterile bandage was applied.    Posterior tibial tendon dysfunction with flexible flat foot but lateral pain recommend UCBL orthosis  Low back pain radiating to left lower extremity intermittent symptoms observed    Arther Abbott, MD 10/03/2015 4:43 PM

## 2015-12-02 ENCOUNTER — Ambulatory Visit: Payer: Self-pay | Admitting: Orthopedic Surgery

## 2015-12-03 ENCOUNTER — Encounter: Payer: Self-pay | Admitting: Orthopedic Surgery

## 2015-12-31 ENCOUNTER — Ambulatory Visit (INDEPENDENT_AMBULATORY_CARE_PROVIDER_SITE_OTHER): Payer: BLUE CROSS/BLUE SHIELD | Admitting: Orthopedic Surgery

## 2015-12-31 ENCOUNTER — Encounter: Payer: Self-pay | Admitting: Orthopedic Surgery

## 2015-12-31 DIAGNOSIS — M1712 Unilateral primary osteoarthritis, left knee: Secondary | ICD-10-CM

## 2015-12-31 DIAGNOSIS — M2140 Flat foot [pes planus] (acquired), unspecified foot: Secondary | ICD-10-CM | POA: Diagnosis not present

## 2015-12-31 DIAGNOSIS — G8929 Other chronic pain: Secondary | ICD-10-CM

## 2015-12-31 DIAGNOSIS — M25562 Pain in left knee: Secondary | ICD-10-CM | POA: Diagnosis not present

## 2015-12-31 DIAGNOSIS — M76829 Posterior tibial tendinitis, unspecified leg: Secondary | ICD-10-CM

## 2015-12-31 NOTE — Progress Notes (Signed)
Chief Complaint  Patient presents with  . Follow-up    LEFT KNEE    Follow-up for [name] she has had an injection in her left knee she says that's much better. She's not have any trouble with walking. She has some grinding in the right and left knee but it's asymptomatic  She was unable to get the UCBL orthotics for the feet and would like the number. She still having pain related to that  The back pain is resolved or has resolved  Review of systems Review of Systems  Musculoskeletal: Positive for myalgias.   Left knee full range of motion crepitance in the patella no tenderness or pain no swelling ligament stable  Right knee mild patellofemoral crepitance but stable  Bilateral feet flexible pes planus with posterior pain at the calcaneus and Achilles in each foot  Recommend UCBL orthotics  Follow-up with [place] as needed

## 2016-01-28 ENCOUNTER — Encounter: Payer: Self-pay | Admitting: Family Medicine

## 2016-01-28 ENCOUNTER — Ambulatory Visit (INDEPENDENT_AMBULATORY_CARE_PROVIDER_SITE_OTHER): Payer: BLUE CROSS/BLUE SHIELD | Admitting: Family Medicine

## 2016-01-28 VITALS — BP 120/80 | HR 75 | Resp 16 | Ht 65.0 in | Wt 296.0 lb

## 2016-01-28 DIAGNOSIS — G4489 Other headache syndrome: Secondary | ICD-10-CM | POA: Diagnosis not present

## 2016-01-28 DIAGNOSIS — E559 Vitamin D deficiency, unspecified: Secondary | ICD-10-CM

## 2016-01-28 DIAGNOSIS — E8881 Metabolic syndrome: Secondary | ICD-10-CM

## 2016-01-28 DIAGNOSIS — R7303 Prediabetes: Secondary | ICD-10-CM

## 2016-01-28 DIAGNOSIS — Z23 Encounter for immunization: Secondary | ICD-10-CM | POA: Diagnosis not present

## 2016-01-28 DIAGNOSIS — E785 Hyperlipidemia, unspecified: Secondary | ICD-10-CM

## 2016-01-28 DIAGNOSIS — J302 Other seasonal allergic rhinitis: Secondary | ICD-10-CM

## 2016-01-28 DIAGNOSIS — R42 Dizziness and giddiness: Secondary | ICD-10-CM | POA: Diagnosis not present

## 2016-01-28 LAB — BASIC METABOLIC PANEL
BUN: 16 mg/dL (ref 7–25)
CALCIUM: 9.1 mg/dL (ref 8.6–10.4)
CO2: 24 mmol/L (ref 20–31)
Chloride: 109 mmol/L (ref 98–110)
Creat: 0.8 mg/dL (ref 0.50–1.05)
GLUCOSE: 93 mg/dL (ref 65–99)
POTASSIUM: 4.2 mmol/L (ref 3.5–5.3)
SODIUM: 141 mmol/L (ref 135–146)

## 2016-01-28 LAB — HEMOGLOBIN A1C
Hgb A1c MFr Bld: 5.8 % — ABNORMAL HIGH (ref <5.7)
MEAN PLASMA GLUCOSE: 120 mg/dL

## 2016-01-28 LAB — CBC
HCT: 40.2 % (ref 35.0–45.0)
HEMOGLOBIN: 13.3 g/dL (ref 11.7–15.5)
MCH: 30.8 pg (ref 27.0–33.0)
MCHC: 33.1 g/dL (ref 32.0–36.0)
MCV: 93.1 fL (ref 80.0–100.0)
MPV: 11.3 fL (ref 7.5–12.5)
PLATELETS: 211 10*3/uL (ref 140–400)
RBC: 4.32 MIL/uL (ref 3.80–5.10)
RDW: 14.9 % (ref 11.0–15.0)
WBC: 4.3 10*3/uL (ref 3.8–10.8)

## 2016-01-28 LAB — LIPID PANEL
CHOL/HDL RATIO: 4.1 ratio (ref <5.0)
CHOLESTEROL: 219 mg/dL — AB (ref <200)
HDL: 54 mg/dL (ref >50)
LDL Cholesterol: 139 mg/dL — ABNORMAL HIGH (ref <100)
Triglycerides: 131 mg/dL (ref <150)
VLDL: 26 mg/dL (ref <30)

## 2016-01-28 MED ORDER — METFORMIN HCL 500 MG PO TABS: 500.0000 mg | ORAL_TABLET | Freq: Two times a day (BID) | ORAL | 3 refills | Status: DC

## 2016-01-28 MED ORDER — BUTALBITAL-APAP-CAFFEINE 50-325-40 MG PO TABS: 1.0000 | ORAL_TABLET | Freq: Two times a day (BID) | ORAL | 3 refills | Status: DC | PRN

## 2016-01-28 MED ORDER — FLUTICASONE PROPIONATE 50 MCG/ACT NA SUSP: 2.0000 | Freq: Every day | NASAL | 4 refills | Status: DC

## 2016-01-28 NOTE — Assessment & Plan Note (Addendum)
Reports ongoing vertigo, mainly change in position, most recent episode  In Novemebr, duration was entire day, medication did not help much, will call if recurrs with severity, eNT evaluation recommended

## 2016-01-28 NOTE — Progress Notes (Signed)
Kathleen Reynolds     MRN: [id number]      DOB: [birth year]   HPI Kathleen Reynolds is here for follow up and re-evaluation of chronic medical conditions, medication management and review of any available recent lab and radiology data.  Preventive health is updated, specifically  Cancer screening and Immunization.   Questions or concerns regarding consultations or procedures which the PT has had in the interim are  addressed. The PT denies any adverse reactions to current medications since the last visit.  There are no new concerns.  There are no specific complaints   ROS Denies recent fever or chills. Denies sinus pressure, nasal congestion, ear pain or sore throat. Denies chest congestion, productive cough or wheezing. Denies chest pains, palpitations and leg swelling Denies abdominal pain, nausea, vomiting,diarrhea or constipation.   Denies dysuria, frequency, hesitancy or incontinence. Denies joint pain, swelling and limitation in mobility. Denies headaches, seizures, numbness, or tingling. Denies depression, anxiety or insomnia. Denies skin break down or rash.   PE  BP 120/80   Pulse 75   Resp 16   Ht 5\' 5"  (1.651 m)   Wt 296 lb (134.3 kg)   LMP 03/27/2011   SpO2 97%   BMI 49.26 kg/m   Patient alert and oriented and in no cardiopulmonary distress.  HEENT: No facial asymmetry, EOMI,   oropharynx pink and moist.  Neck supple no JVD, no mass.  Chest: Clear to auscultation bilaterally.  CVS: S1, S2 no murmurs, no S3.Regular rate.  ABD: Soft non tender.   Ext: No edema  MS: Adequate ROM spine, shoulders, hips and knees.  Skin: Intact, no ulcerations or rash noted.  Psych: Good eye contact, normal affect. Memory intact not anxious or depressed appearing.  CNS: CN 2-12 intact, power,  normal throughout.no focal deficits noted.   Assessment & Plan  Headache On avg twice per month, responds to medication, one only   Vertigo, intermittent Reports ongoing  vertigo, mainly change in position, most recent episode  In Novemebr, duration was entire day, medication did not help much, will call if recurrs with severity, eNT evaluation recommended  Dyslipidemia Hyperlipidemia:Low fat diet discussed and encouraged.   Lipid Panel  Lab Results  Component Value Date   CHOL 211 (H) 07/31/2014   HDL 63 07/31/2014   LDLCALC 121 (H) 07/31/2014   TRIG 137 07/31/2014   CHOLHDL 3.3 07/31/2014      Updated lab needed at/ before next visit.   Obesity, morbid, BMI 40.0-49.9 ([facility]) Deteriorated. Patient re-educated about  the importance of commitment to a  minimum of 150 minutes of exercise per week.  The importance of healthy food choices with portion control discussed. Encouraged to start a food diary, count calories and to consider  joining a support group. Sample diet sheets offered. Goals set by the patient for the next several months.   Weight /BMI 01/28/2016 10/03/2015 08/29/2015  WEIGHT 296 lb 289 lb 289 lb  HEIGHT 5\' 5"  5' 4.5" 5\' 4"   BMI 49.26 kg/m2 48.84 kg/m2 49.61 kg/m2      Prediabetes Patient educated about the importance of limiting  Carbohydrate intake , the need to commit to daily physical activity for a minimum of 30 minutes , and to commit weight loss. The fact that changes in all these areas will reduce or eliminate all together the development of diabetes is stressed.   Diabetic Labs Latest Ref Rng & Units 04/30/2015 08/01/2014 07/31/2014 05/01/2014 12/18/2013  HbA1c <5.7 % 6.1(H) - 6.1(H) -  5.9(H)  Chol 0 - 200 mg/dL - - 211(H) - 201(H)  HDL >=46 mg/dL - - 63 - 57  Calc LDL 0 - 99 mg/dL - - 121(H) - 116(H)  Triglycerides <150 mg/dL - - 137 - 138  Creatinine 0.50 - 1.05 mg/dL 0.70 0.69 - 0.65 0.75   BP/Weight 01/28/2016 10/03/2015 08/29/2015 08/28/2015 08/19/2015 08/16/2015 123456  Systolic BP 123456 [id number] 123456 - [id number] 123456  Diastolic BP 80 66 62 - 74 71 79  Wt. (Lbs) 296 289 289 289 292 292.2 288.4  BMI 49.26 48.84 49.61 49.61  50.1 50.16 49.48   No flowsheet data found.  Updated lab needed at/ before next visit.   Allergic rhinitis Controlled, no change in medication   Metabolic syndrome X The increased risk of cardiovascular disease associated with this diagnosis, and the need to consistently work on lifestyle to change this is discussed. Following  a  heart healthy diet ,commitment to 30 minutes of exercise at least 5 days per week, as well as control of blood sugar and cholesterol , and achieving a healthy weight are all the areas to be addressed .

## 2016-01-28 NOTE — Assessment & Plan Note (Signed)
Deteriorated. Patient re-educated about  the importance of commitment to a  minimum of 150 minutes of exercise per week.  The importance of healthy food choices with portion control discussed. Encouraged to start a food diary, count calories and to consider  joining a support group. Sample diet sheets offered. Goals set by the patient for the next several months.   Weight /BMI 01/28/2016 10/03/2015 08/29/2015  WEIGHT 296 lb 289 lb 289 lb  HEIGHT 5\' 5"  5' 4.5" 5\' 4"   BMI 49.26 kg/m2 48.84 kg/m2 49.61 kg/m2

## 2016-01-28 NOTE — Assessment & Plan Note (Signed)
Controlled, no change in medication  

## 2016-01-28 NOTE — Assessment & Plan Note (Signed)
The increased risk of cardiovascular disease associated with this diagnosis, and the need to consistently work on lifestyle to change this is discussed. Following  a  heart healthy diet ,commitment to 30 minutes of exercise at least 5 days per week, as well as control of blood sugar and cholesterol , and achieving a healthy weight are all the areas to be addressed .  

## 2016-01-28 NOTE — Assessment & Plan Note (Signed)
Patient educated about the importance of limiting  Carbohydrate intake , the need to commit to daily physical activity for a minimum of 30 minutes , and to commit weight loss. The fact that changes in all these areas will reduce or eliminate all together the development of diabetes is stressed.   Diabetic Labs Latest Ref Rng & Units 04/30/2015 08/01/2014 07/31/2014 05/01/2014 12/18/2013  HbA1c <5.7 % 6.1(H) - 6.1(H) - 5.9(H)  Chol 0 - 200 mg/dL - - 211(H) - 201(H)  HDL >=46 mg/dL - - 63 - 57  Calc LDL 0 - 99 mg/dL - - 121(H) - 116(H)  Triglycerides <150 mg/dL - - 137 - 138  Creatinine 0.50 - 1.05 mg/dL 0.70 0.69 - 0.65 0.75   BP/Weight 01/28/2016 10/03/2015 08/29/2015 08/28/2015 08/19/2015 08/16/2015 123456  Systolic BP 123456 [id number] 123456 - [id number] 123456  Diastolic BP 80 66 62 - 74 71 79  Wt. (Lbs) 296 289 289 289 292 292.2 288.4  BMI 49.26 48.84 49.61 49.61 50.1 50.16 49.48   No flowsheet data found.  Updated lab needed at/ before next visit.

## 2016-01-28 NOTE — Patient Instructions (Signed)
F/u in 5 months, call if you need me before  Fasting labs today  TdAp and flu vaccinetioday  Please commit to lifestyle change  Call for ENT referral once you decide on this for recurrent vertigo  Thank you  for choosing [facility]. We consider it a privelige to serve you.  Delivering excellent health care in a caring and  compassionate way is our goal.  Partnering with you,  so that together we can achieve this goal is our strategy.

## 2016-01-28 NOTE — Assessment & Plan Note (Signed)
Hyperlipidemia:Low fat diet discussed and encouraged.   Lipid Panel  Lab Results  Component Value Date   CHOL 211 (H) 07/31/2014   HDL 63 07/31/2014   LDLCALC 121 (H) 07/31/2014   TRIG 137 07/31/2014   CHOLHDL 3.3 07/31/2014      Updated lab needed at/ before next visit.

## 2016-01-28 NOTE — Assessment & Plan Note (Signed)
On avg twice per month, responds to medication, one only

## 2016-01-29 ENCOUNTER — Other Ambulatory Visit: Payer: Self-pay | Admitting: Nurse Practitioner

## 2016-01-29 ENCOUNTER — Encounter: Payer: Self-pay | Admitting: Family Medicine

## 2016-01-29 LAB — VITAMIN D 25 HYDROXY (VIT D DEFICIENCY, FRACTURES): VIT D 25 HYDROXY: 29 ng/mL — AB (ref 30–100)

## 2016-02-21 ENCOUNTER — Ambulatory Visit (HOSPITAL_COMMUNITY): Payer: Self-pay | Admitting: Hematology & Oncology

## 2016-03-10 ENCOUNTER — Ambulatory Visit (HOSPITAL_COMMUNITY): Payer: Self-pay | Admitting: Hematology & Oncology

## 2016-04-07 ENCOUNTER — Ambulatory Visit (HOSPITAL_COMMUNITY): Payer: Self-pay

## 2016-04-14 ENCOUNTER — Telehealth: Payer: Self-pay | Admitting: Orthopedic Surgery

## 2016-04-14 NOTE — Telephone Encounter (Signed)
Patient relays that she has been scheduled for appointment at [facility], [address], [place], [place] [address]  Phone: [phone] for custom orthotics, per Dr Ruthe Mannan advice.  Appointment is scheduled there today, 04/14/16, 1:00p.m.  Confirmed, per Colletta [place].

## 2016-05-05 ENCOUNTER — Encounter (HOSPITAL_COMMUNITY): Payer: BLUE CROSS/BLUE SHIELD | Attending: Oncology | Admitting: Oncology

## 2016-05-05 ENCOUNTER — Encounter (HOSPITAL_COMMUNITY): Payer: Self-pay

## 2016-05-05 VITALS — BP 129/84 | HR 84 | Temp 98.1°F | Resp 20 | Wt 291.1 lb

## 2016-05-05 DIAGNOSIS — C50911 Malignant neoplasm of unspecified site of right female breast: Secondary | ICD-10-CM

## 2016-05-05 DIAGNOSIS — Z853 Personal history of malignant neoplasm of breast: Secondary | ICD-10-CM

## 2016-05-05 NOTE — Progress Notes (Signed)
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 Fanny Skates on 02/13/2011 followed by sentinel node biopsy which was negative. Her Ki-67 marker was high at 54% HER-2/neu was nonamplified and she was ER/PR negative. She is now status post 6 cycles of carboplatin and Taxotere and S/P radiation. No evidence of recurrence.  #2.Left-sided breast cancer stage II grade 3 triple negative at that time as well with 7 negative nodes but a 2.3 cm primary with mastectomy on 11/27/1997. She was treated with AC. x4 cycles followed by Taxol x4 cycles thus far without recurrence.  #3. BRCA1 positivity.   CURRENT THERAPY:Observation and surveillance   No history exists.   INTERVAL HISTORY: Kathleen Reynolds 56 y.o. female returns for followup of Right sided triple negative breast cancer 9 mm in size status post wire localization and removal by Dr. Fanny Skates on 02/13/2011 followed by sentinel node biopsy which was negative. Her Ki-67 marker was high at 54% HER-2/neu was nonamplified and she was ER/PR negative.   She has been doing well. She hasn't been sleeping well due to stress. She has also had some neck pain recently. Denies lumps, chest pain, abdominal pain, SOB, bone pain, or any other concerns.    Past Medical History:  Diagnosis Date  . Anemia LIFELONG   HEAVY MENSES MAR 2012 HB 13 MCV 86.7 FERRITIN  66  . Arthritis   . Breast cancer ([place])    Right  . Breast mass in female    right  . 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 Hyperlipidemia   . Left rotator cuff tear   . Migraines   . Morbid obesity ([place])   . RUPTURE ROTATOR CUFF 06/08/2007   Qualifier: Diagnosis of  By: Aline Brochure MD, Dorothyann Peng    . Urticaria     has Thyroid  nodule; Dyslipidemia; Headache; Prediabetes; Invasive ductal carcinoma of right breast, stage 1; BRCA1 positive; OA (osteoarthritis) of knee; Anemia; Obesity, morbid, BMI 40.0-49.9 ([place]); Metabolic syndrome X; Achilles tendonitis, bilateral; Rotator cuff syndrome; Nonspecific elevation of levels of transaminase or lactic acid dehydrogenase (LDH); Vertigo, intermittent; Allergic rhinitis; Sciatica of right side associated with disorder of lumbar spine; Breast cancer, left breast ([place]); and Mass of breast on her problem list.     is allergic to nsaids and ibuprofen.  [name] had no medications administered during this visit.  Past Surgical History:  Procedure Laterality Date  . BREAST BIOPSY  02/13/2011   Procedure: BREAST BIOPSY WITH NEEDLE LOCALIZATION;  Surgeon: Adin Hector, MD;  Location: [facility];  Service: General;  Laterality: Right;  . BREAST SURGERY  11/1997   Left breast cancer-mastectomy  . BREAST SURGERY  2/13-   rt axilly bx  . COLONOSCOPY  2004 DR. SMITH   No polyp, hemorrhoids  . COLONOSCOPY  06/09/10   WEX:[id number] adenomas   . COLONOSCOPY N/A 08/19/2015   Procedure: COLONOSCOPY;  Surgeon: Danie Binder, MD;  [facility];  Service: Endoscopy;  Laterality: N/A;  130 - moved to 12:45 - Ginger notified pt  . DILATION AND CURETTAGE OF UTERUS    . DILATION AND CURETTAGE OF UTERUS  05/2002  . hystersonogram  2009  . MASTECTOMY  1999   left breast cancer   .  PORT-A-CATH REMOVAL  10/20/2011   Procedure: MINOR REMOVAL PORT-A-CATH;  Surgeon: Adin Hector, MD;  Location: [facility];  Service: General;  Laterality: N/A;  Porta-cath removal  left  . PORTACATH PLACEMENT  03/27/2011   Procedure: INSERTION PORT-A-CATH;  Surgeon: Adin Hector, MD;  Location: [facility];  Service: General;  Laterality: Left;  insert port a cath   Review of Systems  Constitutional: Negative.   HENT: Negative.   Eyes: Negative.     Respiratory: Negative.  Negative for shortness of breath.   Cardiovascular: Negative.  Negative for chest pain.  Gastrointestinal: Negative.  Negative for abdominal pain.  Genitourinary: Negative.   Musculoskeletal: Positive for neck pain.       No bone pain No breast lumps  Skin: Negative.   Neurological: Negative.   Endo/Heme/Allergies: Negative.   Psychiatric/Behavioral: The patient has insomnia.   All other systems reviewed and are negative. 14 point review of systems was performed and is negative except as detailed under history of present illness and above  PHYSICAL EXAMINATION ECOG PERFORMANCE STATUS: 0 - Asymptomatic  There were no vitals filed for this visit. Physical Exam  Constitutional: She is oriented to person, place, and time and well-developed, well-nourished, and in no distress.  HENT:  Head: Normocephalic and atraumatic.  Eyes: EOM are normal. Pupils are equal, round, and reactive to light.  Neck: Normal range of motion. Neck supple.  Cardiovascular: Normal rate, regular rhythm and normal heart sounds.   Pulmonary/Chest: Effort normal and breath sounds normal. Right breast exhibits no mass, no nipple discharge and no skin change. Left breast exhibits no mass and no skin change.    Abdominal: Soft. Bowel sounds are normal.  Musculoskeletal: Normal range of motion.  Neurological: She is alert and oriented to person, place, and time. Gait normal.  Skin: Skin is warm and dry.  Nursing note and vitals reviewed.  LABORATORY DATA: I have reviewed the data as listed. CBC    Component Value Date/Time   WBC 4.3 01/28/2016 0846   RBC 4.32 01/28/2016 0846   HGB 13.3 01/28/2016 0846   HCT 40.2 01/28/2016 0846   PLT 211 01/28/2016 0846   MCV 93.1 01/28/2016 0846   MCH 30.8 01/28/2016 0846   MCHC 33.1 01/28/2016 0846   RDW 14.9 01/28/2016 0846   LYMPHSABS 1.6 05/01/2014 1050   MONOABS 0.3 05/01/2014 1050   EOSABS 0.1 05/01/2014 1050   BASOSABS 0.0 05/01/2014  1050   CMP Latest Ref Rng & Units 01/28/2016 04/30/2015 08/01/2014  Glucose 65 - 99 mg/dL 93 104(H) 86  BUN 7 - 25 mg/dL 16 13 15   Creatinine 0.50 - 1.05 mg/dL 0.80 0.70 0.69  Sodium 135 - 146 mmol/L 141 139 137  Potassium 3.5 - 5.3 mmol/L 4.2 3.8 3.8  Chloride 98 - 110 mmol/L 109 107 103  CO2 20 - 31 mmol/L 24 23 25   Calcium 8.6 - 10.4 mg/dL 9.1 9.3 8.9  Total Protein 6.5 - 8.1 g/dL - - 7.6  Total Bilirubin 0.3 - 1.2 mg/dL - - 0.6  Alkaline Phos 38 - 126 U/L - - 63  AST 15 - 41 U/L - - 22  ALT 14 - 54 U/L - - 27     Chemistry      Component Value Date/Time   NA 141 01/28/2016 0846   K 4.2 01/28/2016 0846   CL 109 01/28/2016 0846   CO2 24 01/28/2016 0846   BUN 16 01/28/2016 0846  CREATININE 0.80 01/28/2016 0846      Component Value Date/Time   CALCIUM 9.1 01/28/2016 0846   ALKPHOS 63 08/01/2014 1256   AST 22 08/01/2014 1256   ALT 27 08/01/2014 1256   BILITOT 0.6 08/01/2014 1256     RADIOLOGY I have personally reviewed the radiological images as listed and agreed with the findings in the report.  MRI Breast b/l 08/28/2015 IMPRESSION: No MRI evidence for malignancy. Expected postoperative changes following left mastectomy.  ASSESSMENT:  1. 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 Fanny Skates on 02/13/2011 followed by sentinel node biopsy which was negative. Her Ki-67 marker was high at 54% HER-2/neu was nonamplified and she was ER/PR negative. She is now status post 6 cycles of carboplatin and Taxotere and S/P radiation. No evidence of recurrence.  2. BRCA1 positivity.  3. History of left-sided breast cancer stage II grade 3 triple negative at that time as well with 7 negative nodes but a 2.3 cm primary with mastectomy on 11/27/1997. She was treated with AC. x4 cycles followed by Taxol x4 cycles thus far without recurrence.  4. Left chest wall mass below prior surgical incision site at 6 to 7 o clock  position  PLAN:  Clinically NED on exam today.  I reviewed the breast MRI from 08/2015 with the patient in detail. It was negative for malignancy.   Next mammogram of right breast due in July 2018. Ordered today.   She will return for follow up and exam in 6 months. She knows to come sooner should she palpate any new breast masses.  No orders of the defined types were placed in this encounter.  All questions were answered. The patient knows to call the clinic with any problems, questions or concerns. We can certainly see the patient much sooner if necessary.   This document serves as a record of services personally performed by Twana First, MD. It was created on her behalf by Martinique Casey, a trained medical scribe. The creation of this record is based on the scribe's personal observations and the provider's statements to them. This document has been checked and approved by the attending provider.  I have reviewed the above documentation for accuracy and completeness, and I agree with the above.  This note was signed electronically.  Martinique M Casey

## 2016-05-05 NOTE — Patient Instructions (Signed)
[place] at [facility] Discharge Instructions  RECOMMENDATIONS MADE BY THE CONSULTANT AND ANY TEST RESULTS WILL BE SENT TO YOUR REFERRING PHYSICIAN.  You were seen today by Dr. Twana First Follow up in July with mammogram We will see you back in the clinic in 6 months  See Amy up front for appointments   Thank you for choosing [place] at [facility] to provide your oncology and hematology care.  To afford each patient quality time with our provider, please arrive at least 15 minutes before your scheduled appointment time.    If you have a lab appointment with the [facility] please come in thru the  Main Entrance and check in at the main information desk  You need to re-schedule your appointment should you arrive 10 or more minutes late.  We strive to give you quality time with our providers, and arriving late affects you and other patients whose appointments are after yours.  Also, if you no show three or more times for appointments you may be dismissed from the clinic at the providers discretion.     Again, thank you for choosing [facility].  Our hope is that these requests will decrease the amount of time that you wait before being seen by our physicians.       _____________________________________________________________  Should you have questions after your visit to [facility], please contact our office at [phone] between the hours of 8:30 a.m. and 4:30 p.m.  Voicemails left after 4:30 p.m. will not be returned until the following business day.  For prescription refill requests, have your pharmacy contact our office.       Resources For Cancer Patients and their Caregivers ? American Cancer Society: Can assist with transportation, wigs, general needs, runs Look Good Feel Better.        (712)676-6105 ? Cancer Care: Provides financial assistance, online support groups, medication/co-pay  assistance.  1-800-813-HOPE 778-585-5331) ? [facility] Assists [place] cancer patients and their families through emotional , educational and financial support.  931-155-3159 ? [place] DSS Where to apply for food stamps, Medicaid and utility assistance. (236)401-1717 ? [facility]: Transportation to medical appointments. (317)194-8253 ? Social Security Administration: May apply for disability if have a Stage IV cancer. 579 604 9925 626-560-2304 ? [employer], Disability and Transit Services: Assists with nutrition, care and transit needs. [place] Support Programs: @10RELATIVEDAYS @ > Cancer Support Group  2nd Tuesday of the month 1pm-2pm, Journey Room  > Creative Journey  3rd Tuesday of the month 1130am-1pm, Journey Room  > Look Good Feel Better  1st Wednesday of the month 10am-12 noon, Journey Room (Call [place] to register [phone])

## 2016-05-25 ENCOUNTER — Other Ambulatory Visit: Payer: Self-pay | Admitting: Family Medicine

## 2016-06-23 ENCOUNTER — Ambulatory Visit: Payer: Self-pay | Admitting: Orthopedic Surgery

## 2016-06-25 ENCOUNTER — Encounter: Payer: Self-pay | Admitting: Orthopaedic Surgery

## 2016-06-25 ENCOUNTER — Ambulatory Visit (INDEPENDENT_AMBULATORY_CARE_PROVIDER_SITE_OTHER): Payer: BLUE CROSS/BLUE SHIELD

## 2016-06-25 ENCOUNTER — Ambulatory Visit (INDEPENDENT_AMBULATORY_CARE_PROVIDER_SITE_OTHER): Payer: BLUE CROSS/BLUE SHIELD | Admitting: Orthopaedic Surgery

## 2016-06-25 VITALS — BP 127/73 | HR 85 | Temp 97.5°F | Ht 65.0 in | Wt 292.0 lb

## 2016-06-25 DIAGNOSIS — M25561 Pain in right knee: Secondary | ICD-10-CM

## 2016-06-25 DIAGNOSIS — G8929 Other chronic pain: Secondary | ICD-10-CM

## 2016-06-25 NOTE — Progress Notes (Signed)
Subjective:    Patient ID: Kathleen Reynolds, female    DOB: [birth year], 56 y.o.   MRN: [id number]  HPI She has had pain in the right knee for some time.  She has more swelling and popping.  She saw Dr. Aline Brochure last year for knee pain, then more on the left. The right knee swells and pops.  She has giving way of the knee.  She has no new trauma.  She has no distal edema or redness.  She has tried ice, heat, rubs with little help.    Review of Systems  HENT: Negative for congestion.   Respiratory: Negative for cough and shortness of breath.   Cardiovascular: Negative for chest pain and leg swelling.  Endocrine: Positive for cold intolerance.  Musculoskeletal: Positive for arthralgias, gait problem and joint swelling.  Allergic/Immunologic: Positive for environmental allergies.  Neurological: Positive for headaches.   Past Medical History:  Diagnosis Date  . Anemia LIFELONG   HEAVY MENSES MAR 2012 HB 13 MCV 86.7 FERRITIN  66  . Arthritis   . Breast cancer ([place])    Right  . Breast mass in female    right  . 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 Hyperlipidemia   . Left rotator cuff tear   . Migraines   . Morbid obesity ([place])   . RUPTURE ROTATOR CUFF 06/08/2007   Qualifier: Diagnosis of  By: Aline Brochure MD, Dorothyann Peng    . Urticaria     Past Surgical History:  Procedure Laterality Date  . BREAST BIOPSY  02/13/2011   Procedure: BREAST BIOPSY WITH NEEDLE LOCALIZATION;  Surgeon: Adin Hector, MD;  Location: [facility];  Service: General;  Laterality: Right;  . BREAST SURGERY  11/1997   Left breast cancer-mastectomy  . BREAST SURGERY  2/13-   rt axilly bx  . COLONOSCOPY  2004 DR. SMITH   No polyp, hemorrhoids  . COLONOSCOPY  06/09/10   [id number] adenomas   . COLONOSCOPY N/A 08/19/2015   Procedure: COLONOSCOPY;  Surgeon: Danie Binder, MD;  [facility];  Service: Endoscopy;  Laterality: N/A;  130 - moved to 12:45 - Ginger notified pt  . DILATION AND CURETTAGE  OF UTERUS    . DILATION AND CURETTAGE OF UTERUS  05/2002  . hystersonogram  2009  . MASTECTOMY  1999   left breast cancer   . PORT-A-CATH REMOVAL  10/20/2011   Procedure: MINOR REMOVAL PORT-A-CATH;  Surgeon: Adin Hector, MD;  Location: [facility];  Service: General;  Laterality: N/A;  Porta-cath removal  left  . PORTACATH PLACEMENT  03/27/2011   Procedure: INSERTION PORT-A-CATH;  Surgeon: Adin Hector, MD;  Location: [facility];  Service: General;  Laterality: Left;  insert port a cath    Current Outpatient Prescriptions on File Prior to Visit  Medication Sig Dispense Refill  . butalbital-acetaminophen-caffeine (FIORICET, ESGIC) 50-325-40 MG tablet Take 1 tablet by mouth 2 (two) times daily as needed. For migraine 14 tablet 3  . EPINEPHrine 0.3 mg/0.3 mL IJ SOAJ injection Inject 0.3 mLs (0.3 mg total) into the muscle once. 2 Device 1  . fluticasone (FLONASE) 50 MCG/ACT nasal spray Place 2 sprays into both nostrils daily. 16 g 4  . meclizine (ANTIVERT) 25 MG tablet Take 1 tablet (25 mg total) by mouth 3 (three) times daily as needed. For dizziness 30 tablet 3  . metFORMIN (GLUCOPHAGE) 500 MG tablet TAKE 1 TABLET (500 MG TOTAL) BY MOUTH 2 (TWO) TIMES DAILY WITH  A MEAL. 180 tablet 1  . Multiple Vitamin (MULITIVITAMIN WITH MINERALS) TABS Take 1 tablet by mouth daily.    . traMADol-acetaminophen (ULTRACET) 37.5-325 MG tablet Take 1 tablet by mouth every 6 (six) hours as needed. 60 tablet 5   No current facility-administered medications on file prior to visit.     Social History   Social History  . Marital status: Single    Spouse name: N/A  . Number of children: N/A  . Years of education: N/A   Occupational History  . Not on file.   Social History Main Topics  . Smoking status: Never Smoker  . Smokeless tobacco: Never Used  . Alcohol use 1.0 oz/week    2 drink(s) per week     Comment: occasional  . Drug use: No  . Sexual activity: Yes    Birth  control/ protection: None   Other Topics Concern  . Not on file   Social History Narrative   Single, 1 kid-56 yo female-lives with mom   Employed as a CNA   Rare Etoh.   No tobacco products    Family History  Problem Relation Age of Onset  . Hypertension Mother   . Cancer Father        lung cancer  . Diabetes Sister   . Cancer Brother        colon cancer  . Heart disease Sister        heart attack  . Asthma Sister   . Eczema Sister   . Allergic rhinitis Neg Hx   . Urticaria Neg Hx     BP 127/73   Pulse 85   Temp 97.5 F (36.4 C)   Ht 5\' 5"  (1.651 m)   Wt 292 lb (132.5 kg)   LMP 03/27/2011   BMI 48.59 kg/m      Objective:   Physical Exam  Constitutional: She is oriented to person, place, and time. She appears well-developed and well-nourished.  HENT:  Head: Normocephalic and atraumatic.  Eyes: Conjunctivae and EOM are normal. Pupils are equal, round, and reactive to light.  Neck: Normal range of motion. Neck supple.  Cardiovascular: Normal rate, regular rhythm and intact distal pulses.   Pulmonary/Chest: Effort normal.  Abdominal: Soft.  Musculoskeletal: She exhibits tenderness (Right knee pain, ROM 0 to 100 with pain, crepitus.  Knee has effusion 2+. Positive medial McMurray.  Limp right.  No redness.  Left knee with crepitus ROM 0 - 105.).  Neurological: She is alert and oriented to person, place, and time. She displays normal reflexes. No cranial nerve deficit. She exhibits normal muscle tone. Coordination normal.  Skin: Skin is warm and dry.  Psychiatric: She has a normal mood and affect. Her behavior is normal. Judgment and thought content normal.  Vitals reviewed.   X-rays were done of the right knee, reported separately.      Assessment & Plan:   Encounter Diagnosis  Name Primary?  . Chronic pain of right knee Yes   PROCEDURE NOTE:  The patient request injection, verbal consent was obtained.  The right knee was prepped appropriately after time  out was performed.   Sterile technique was observed and anesthesia was provided by ethyl chloride and a 20-gauge needle was used to inject the knee area.  A 16-gauge needle was then used to aspirate the knee.  Color of fluid aspirated was 35  Total cc's aspirated was straw.    Injection of 1 cc of Depo-Medrol 40 mg with several cc's  of plain xylocaine was then performed.  A band aid dressing was applied.  The patient was advised to apply ice later today and tomorrow to the injection sight as needed.  I would like to get a MRI of the right knee as she has giving way, positive medial McMurray and not getting better.  She agrees.  Return after the MRI.  Call if any problem.  Precautions given.  Electronically Signed Sanjuana Kava, MD 5/24/201810:35 AM

## 2016-06-25 NOTE — Progress Notes (Signed)
Right knee.

## 2016-06-30 ENCOUNTER — Encounter: Payer: Self-pay | Admitting: Family Medicine

## 2016-06-30 ENCOUNTER — Ambulatory Visit (INDEPENDENT_AMBULATORY_CARE_PROVIDER_SITE_OTHER): Payer: BLUE CROSS/BLUE SHIELD | Admitting: Family Medicine

## 2016-06-30 VITALS — BP 118/70 | HR 74 | Resp 16 | Ht 66.0 in | Wt 295.0 lb

## 2016-06-30 DIAGNOSIS — E785 Hyperlipidemia, unspecified: Secondary | ICD-10-CM

## 2016-06-30 DIAGNOSIS — E8881 Metabolic syndrome: Secondary | ICD-10-CM | POA: Diagnosis not present

## 2016-06-30 DIAGNOSIS — R7303 Prediabetes: Secondary | ICD-10-CM | POA: Diagnosis not present

## 2016-06-30 DIAGNOSIS — G4489 Other headache syndrome: Secondary | ICD-10-CM

## 2016-06-30 DIAGNOSIS — J3089 Other allergic rhinitis: Secondary | ICD-10-CM

## 2016-06-30 LAB — LIPID PANEL
CHOLESTEROL: 197 mg/dL (ref <200)
HDL: 53 mg/dL (ref >50)
LDL Cholesterol: 107 mg/dL — ABNORMAL HIGH (ref <100)
TRIGLYCERIDES: 187 mg/dL — AB (ref <150)
Total CHOL/HDL Ratio: 3.7 Ratio (ref <5.0)
VLDL: 37 mg/dL — ABNORMAL HIGH (ref <30)

## 2016-06-30 NOTE — Assessment & Plan Note (Signed)
Improved, less frequent, good response to fioricet

## 2016-06-30 NOTE — Assessment & Plan Note (Signed)
Unchanged, 10 pound weight loss goal set for next 5 months Patient re-educated about  the importance of commitment to a  minimum of 150 minutes of exercise per week.  The importance of healthy food choices with portion control discussed. Encouraged to start a food diary, count calories and to consider  joining a support group. Sample diet sheets offered. Goals set by the patient for the next several months.   Weight /BMI 06/30/2016 06/25/2016 05/05/2016  WEIGHT 295 lb 292 lb 291 lb 1.6 oz  HEIGHT 5\' 6"  5\' 5"  -  BMI 47.61 kg/m2 48.59 kg/m2 48.44 kg/m2

## 2016-06-30 NOTE — Assessment & Plan Note (Signed)
The increased risk of cardiovascular disease associated with this diagnosis, and the need to consistently work on lifestyle to change this is discussed. Following  a  heart healthy diet ,commitment to 30 minutes of exercise at least 5 days per week, as well as control of blood sugar and cholesterol , and achieving a healthy weight are all the areas to be addressed .  

## 2016-06-30 NOTE — Patient Instructions (Signed)
F/u early December, call if you need me before  Fasting lipid, HBA1C today  10 pound weight loss goal in next 5 months  Please work on good  health habits so that your health will improve. 1. Commitment to daily physical activity for 30 to 60  minutes, if you are able to do this.  2. Commitment to wise food choices. Aim for half of your  food intake to be vegetable and fruit, one quarter starchy foods, and one quarter protein. Try to eat on a regular schedule  3 meals per day, snacking between meals should be limited to vegetables or fruits or small portions of nuts. 64 ounces of water per day is generally recommended, unless you have specific health conditions, like heart failure or kidney failure where you will need to limit fluid intake.  3. Commitment to sufficient and a  good quality of physical and mental rest daily, generally between 6 to 8 hours per day.  WITH PERSISTANCE AND PERSEVERANCE, THE IMPOSSIBLE , BECOMES THE NORM! Thank you  for choosing [facility]. We consider it a privelige to serve you.  Delivering excellent health care in a caring and  compassionate way is our goal.  Partnering with you,  so that together we can achieve this goal is our strategy.

## 2016-06-30 NOTE — Assessment & Plan Note (Signed)
Hyperlipidemia:Low fat diet discussed and encouraged.   Lipid Panel  Lab Results  Component Value Date   CHOL 219 (H) 01/28/2016   HDL 54 01/28/2016   LDLCALC 139 (H) 01/28/2016   TRIG 131 01/28/2016   CHOLHDL 4.1 01/28/2016     Updated lab needed

## 2016-06-30 NOTE — Progress Notes (Signed)
Kathleen Reynolds     MRN: [id number]      DOB: 01-18-1961   HPI [name] is here for follow up and re-evaluation of chronic medical conditions, medication management and review of any available recent lab and radiology data.  Preventive health is updated, specifically  Cancer screening and Immunization.   Questions or concerns regarding consultations or procedures which the PT has had in the interim are  Addressed.Increased right knee pain and swelling,  Tender on medical aspect, has upcoming MRI No weight loss, poor discipline with eating  And unable to exercise The PT denies any adverse reactions to current medications since the last visit.  Plans to lose 10 pounds in next 5 monthsThere are no specific complaints   ROS Denies recent fever or chills. Denies sinus pressure, , ear pain or sore throat.increased nasal drainage and occasional headache in past 2 weeks Denies chest congestion, productive cough or wheezing. Denies chest pains, palpitations and leg swelling Denies abdominal pain, nausea, vomiting,diarrhea or constipation.   Denies dysuria, frequency, hesitancy or incontinence. . Denies headaches, seizures, numbness, or tingling. Denies depression, anxiety or insomnia. Denies skin break down or rash.   PE  BP 118/70   Pulse 74   Resp 16   Ht 5\' 6"  (1.676 m)   Wt 295 lb (133.8 kg)   LMP 03/27/2011   SpO2 94%   BMI 47.61 kg/m   Patient alert and oriented and in no cardiopulmonary distress.  HEENT: No facial asymmetry, EOMI,   oropharynx pink and moist.  Neck supple no JVD, no mass.  Chest: Clear to auscultation bilaterally.  CVS: S1, S2 no murmurs, no S3.Regular rate.  ABD: Soft non tender.   Ext: No edema  MS: Adequate ROM spine, shoulders, hips and reduced in right  Knee, tender over medial aspect.  Skin: Intact, no ulcerations or rash noted.  Psych: Good eye contact, normal affect. Memory intact not anxious or depressed appearing.  CNS: CN 2-12  intact, power,  normal throughout.no focal deficits noted.   Assessment & Plan  Dyslipidemia Hyperlipidemia:Low fat diet discussed and encouraged.   Lipid Panel  Lab Results  Component Value Date   CHOL 219 (H) 01/28/2016   HDL 54 01/28/2016   LDLCALC 139 (H) 01/28/2016   TRIG 131 01/28/2016   CHOLHDL 4.1 01/28/2016     Updated lab needed    Prediabetes Patient educated about the importance of limiting  Carbohydrate intake , the need to commit to daily physical activity for a minimum of 30 minutes , and to commit weight loss. The fact that changes in all these areas will reduce or eliminate all together the development of diabetes is stressed.   Diabetic Labs Latest Ref Rng & Units 01/28/2016 04/30/2015 08/01/2014 07/31/2014 05/01/2014  HbA1c <5.7 % 5.8(H) 6.1(H) - 6.1(H) -  Chol <200 mg/dL 219(H) - - 211(H) -  HDL >50 mg/dL 54 - - 63 -  Calc LDL <100 mg/dL 139(H) - - 121(H) -  Triglycerides <150 mg/dL 131 - - 137 -  Creatinine 0.50 - 1.05 mg/dL 0.80 0.70 0.69 - 0.65   BP/Weight 06/30/2016 06/25/2016 05/05/2016 01/28/2016 10/03/2015 08/29/2015 05/11/8117  Systolic BP 147 829 562 130 865 784 -  Diastolic BP 70 73 84 80 66 62 -  Wt. (Lbs) 295 292 291.1 296 289 289 289  BMI 47.61 48.59 48.44 49.26 48.84 49.61 49.61   No flowsheet data found.  Updated lab needed .   Metabolic syndrome X The increased risk  of cardiovascular disease associated with this diagnosis, and the need to consistently work on lifestyle to change this is discussed. Following  a  heart healthy diet ,commitment to 30 minutes of exercise at least 5 days per week, as well as control of blood sugar and cholesterol , and achieving a healthy weight are all the areas to be addressed .   Obesity, morbid, BMI 40.0-49.9 (HCC) Unchanged, 10 pound weight loss goal set for next 5 months Patient re-educated about  the importance of commitment to a  minimum of 150 minutes of exercise per week.  The importance of healthy  food choices with portion control discussed. Encouraged to start a food diary, count calories and to consider  joining a support group. Sample diet sheets offered. Goals set by the patient for the next several months.   Weight /BMI 06/30/2016 06/25/2016 05/05/2016  WEIGHT 295 lb 292 lb 291 lb 1.6 oz  HEIGHT 5\' 6"  5\' 5"  -  BMI 47.61 kg/m2 48.59 kg/m2 48.44 kg/m2      Allergic rhinitis Increased symptoms in recent 2 weeks , encouraged use of flonase  Headache Improved, less frequent, good response to fioricet

## 2016-06-30 NOTE — Assessment & Plan Note (Signed)
Patient educated about the importance of limiting  Carbohydrate intake , the need to commit to daily physical activity for a minimum of 30 minutes , and to commit weight loss. The fact that changes in all these areas will reduce or eliminate all together the development of diabetes is stressed.   Diabetic Labs Latest Ref Rng & Units 01/28/2016 04/30/2015 08/01/2014 07/31/2014 05/01/2014  HbA1c <5.7 % 5.8(H) 6.1(H) - 6.1(H) -  Chol <200 mg/dL 219(H) - - 211(H) -  HDL >50 mg/dL 54 - - 63 -  Calc LDL <100 mg/dL 139(H) - - 121(H) -  Triglycerides <150 mg/dL 131 - - 137 -  Creatinine 0.50 - 1.05 mg/dL 0.80 0.70 0.69 - 0.65   BP/Weight 06/30/2016 06/25/2016 05/05/2016 01/28/2016 10/03/2015 08/29/2015 08/27/2033  Systolic BP 597 416 384 536 468 032 -  Diastolic BP 70 73 84 80 66 62 -  Wt. (Lbs) 295 292 291.1 296 289 289 289  BMI 47.61 48.59 48.44 49.26 48.84 49.61 49.61   No flowsheet data found.  Updated lab needed .

## 2016-06-30 NOTE — Assessment & Plan Note (Signed)
Increased symptoms in recent 2 weeks , encouraged use of flonase

## 2016-07-01 ENCOUNTER — Encounter: Payer: Self-pay | Admitting: Family Medicine

## 2016-07-01 LAB — HEMOGLOBIN A1C
Hgb A1c MFr Bld: 5.9 % — ABNORMAL HIGH (ref <5.7)
Mean Plasma Glucose: 123 mg/dL

## 2016-07-07 ENCOUNTER — Ambulatory Visit: Payer: Self-pay | Admitting: Orthopaedic Surgery

## 2016-07-07 ENCOUNTER — Encounter: Payer: Self-pay | Admitting: Orthopaedic Surgery

## 2016-07-07 ENCOUNTER — Ambulatory Visit (INDEPENDENT_AMBULATORY_CARE_PROVIDER_SITE_OTHER): Payer: BLUE CROSS/BLUE SHIELD | Admitting: Orthopaedic Surgery

## 2016-07-07 VITALS — BP 143/95 | HR 77 | Temp 97.7°F | Ht 66.0 in | Wt 294.0 lb

## 2016-07-07 DIAGNOSIS — M25561 Pain in right knee: Secondary | ICD-10-CM

## 2016-07-07 DIAGNOSIS — G8929 Other chronic pain: Secondary | ICD-10-CM

## 2016-07-07 NOTE — Progress Notes (Signed)
Patient Kathleen Reynolds, female DOB:[birth year], 56 y.o. LKG:[id number]  Chief Complaint  Patient presents with  . Results    MRI Right Knee    HPI  Kathleen Reynolds is a 56 y.o. female who has chronic pain of the right knee.  She had a MRI done at [facility] open unit of the right knee.  It shows tricompartmental degenerative changes extruded medial meniscal body without visualized tear and grade 1 sprain of the medial collateral ligament.  I have gone over the results with her.  She cannot take any NSAIDs.  She asked about surgery.  I will have Dr. Aline Brochure see her. HPI  Body mass index is 47.45 kg/m.  ROS  Review of Systems  HENT: Negative for congestion.   Respiratory: Negative for cough and shortness of breath.   Cardiovascular: Negative for chest pain and leg swelling.  Endocrine: Positive for cold intolerance.  Musculoskeletal: Positive for arthralgias, gait problem and joint swelling.  Allergic/Immunologic: Positive for environmental allergies.  Neurological: Positive for headaches.    Past Medical History:  Diagnosis Date  . Anemia LIFELONG   HEAVY MENSES MAR 2012 HB 13 MCV 86.7 FERRITIN  66  . Arthritis   . Breast cancer ([place])    Right  . Breast mass in female    right  . 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 Hyperlipidemia   . Left rotator cuff tear   . Migraines   . Morbid obesity ([place])   . RUPTURE ROTATOR CUFF 06/08/2007   Qualifier: Diagnosis of  By: Aline Brochure MD, Dorothyann Peng    . Urticaria     Past Surgical History:  Procedure Laterality Date  . BREAST BIOPSY  02/13/2011   Procedure: BREAST BIOPSY WITH NEEDLE LOCALIZATION;  Surgeon: Adin Hector, MD;  Location: [facility];  Service: General;  Laterality: Right;  . BREAST SURGERY  11/1997   Left breast cancer-mastectomy  . BREAST SURGERY  2/13-   rt axilly bx  . COLONOSCOPY  2004 DR. SMITH   No polyp, hemorrhoids  . COLONOSCOPY  06/09/10   [id number] adenomas   . COLONOSCOPY N/A  08/19/2015   Procedure: COLONOSCOPY;  Surgeon: Danie Binder, MD;  [facility];  Service: Endoscopy;  Laterality: N/A;  130 - moved to 12:45 - Kathleen Reynolds notified pt  . DILATION AND CURETTAGE OF UTERUS    . DILATION AND CURETTAGE OF UTERUS  05/2002  . hystersonogram  2009  . MASTECTOMY  1999   left breast cancer   . PORT-A-CATH REMOVAL  10/20/2011   Procedure: MINOR REMOVAL PORT-A-CATH;  Surgeon: Adin Hector, MD;  Location: [place];  Service: General;  Laterality: N/A;  Porta-cath removal  left  . PORTACATH PLACEMENT  03/27/2011   Procedure: INSERTION PORT-A-CATH;  Surgeon: Adin Hector, MD;  Location: [place];  Service: General;  Laterality: Left;  insert port a cath    Family History  Problem Relation Age of Onset  . Hypertension Mother   . Cancer Father        lung cancer  . Diabetes Sister   . Cancer Brother        colon cancer  . Heart disease Sister        heart attack  . Asthma Sister   . Eczema Sister   . Allergic rhinitis Neg Hx   . Urticaria Neg Hx     Social History Social History  Substance Use Topics  . Smoking status: Never Smoker  .  Smokeless tobacco: Never Used  . Alcohol use 1.0 oz/week    2 drink(s) per week     Comment: occasional    Allergies  Allergen Reactions  . Nsaids Anaphylaxis    Ibuprofen required hospitalization in 2016 due to ibuprofen  . Ibuprofen Itching    Current Outpatient Prescriptions  Medication Sig Dispense Refill  . butalbital-acetaminophen-caffeine (FIORICET, ESGIC) 50-325-40 MG tablet Take 1 tablet by mouth 2 (two) times daily as needed. For migraine 14 tablet 3  . EPINEPHrine 0.3 mg/0.3 mL IJ SOAJ injection Inject 0.3 mLs (0.3 mg total) into the muscle once. 2 Device 1  . fluticasone (FLONASE) 50 MCG/ACT nasal spray Place 2 sprays into both nostrils daily. 16 g 4  . meclizine (ANTIVERT) 25 MG tablet Take 1 tablet (25 mg total) by mouth 3 (three) times daily as needed. For  dizziness 30 tablet 3  . metFORMIN (GLUCOPHAGE) 500 MG tablet TAKE 1 TABLET (500 MG TOTAL) BY MOUTH 2 (TWO) TIMES DAILY WITH A MEAL. 180 tablet 1  . Multiple Vitamin (MULITIVITAMIN WITH MINERALS) TABS Take 1 tablet by mouth daily.    . traMADol-acetaminophen (ULTRACET) 37.5-325 MG tablet Take 1 tablet by mouth every 6 (six) hours as needed. 60 tablet 5   No current facility-administered medications for this visit.      Physical Exam  Blood pressure (!) 143/95, pulse 77, temperature 97.7 F (36.5 C), height 5\' 6"  (1.676 m), weight 294 lb (133.4 kg), last menstrual period 03/27/2011.  Constitutional: overall normal hygiene, normal nutrition, well developed, normal grooming, normal body habitus. Assistive device:none  Musculoskeletal: gait and station Limp right, muscle tone and strength are normal, no tremors or atrophy is present.  .  Neurological: coordination overall normal.  Deep tendon reflex/nerve stretch intact.  Sensation normal.  Cranial nerves II-XII intact.   Skin:   Normal overall no scars, lesions, ulcers or rashes. No psoriasis.  Psychiatric: Alert and oriented x 3.  Recent memory intact, remote memory unclear.  Normal mood and affect. Well groomed.  Good eye contact.  Cardiovascular: overall no swelling, no varicosities, no edema bilaterally, normal temperatures of the legs and arms, no clubbing, cyanosis and good capillary refill.  Lymphatic: palpation is normal.  Right knee with ROM 0 to 105 with pain and crepitus.  Medial joint line pain and positive medial [place].  Limp right.  Muscle strength and tone normal.  NV intact.  She has no edema.  The patient has been educated about the nature of the problem(s) and counseled on treatment options.  The patient appeared to understand what I have discussed and is in agreement with it.  Encounter Diagnosis  Name Primary?  . Chronic pain of right knee Yes    PLAN Call if any problems.  Precautions discussed.  Continue  current medications.   Return to clinic to see Dr. Aline Brochure   Electronically Signed Sanjuana Kava, MD 6/5/20189:12 AM

## 2016-07-22 ENCOUNTER — Ambulatory Visit (INDEPENDENT_AMBULATORY_CARE_PROVIDER_SITE_OTHER): Payer: BLUE CROSS/BLUE SHIELD | Admitting: Orthopedic Surgery

## 2016-07-22 VITALS — BP 136/88 | HR 85 | Ht 66.0 in | Wt 293.0 lb

## 2016-07-22 DIAGNOSIS — M1711 Unilateral primary osteoarthritis, right knee: Secondary | ICD-10-CM

## 2016-07-22 MED ORDER — ACETAMINOPHEN-CODEINE #3 300-30 MG PO TABS: 1.0000 | ORAL_TABLET | Freq: Every evening | ORAL | 0 refills | Status: DC | PRN

## 2016-07-22 NOTE — Progress Notes (Addendum)
Referal for possible surgery   Chief Complaint  Patient presents with  . Follow-up    Patient of Dr. Brooke Bonito to discuss Right Knee Surgery    This is a 56 year old female with long-standing pain in her right knee. She is atraumatic dull aching pain on the medial joint line which has not responded to injection aspiration or Ultracet. She says she works 12 hour shifts and at the end of the day her right knee is hurting especially on the medial side and she also has dependent edema which responds to elevation overnight.  She says is very difficult for her to walk  She is prediabetic she does have a BMI of 47   Review of Systems  Constitutional: Negative for chills and fever.  Musculoskeletal: Positive for joint pain.  Skin: Negative for itching and rash.  Neurological: Negative for tingling and sensory change.   Past Medical History:  Diagnosis Date  . Anemia LIFELONG   HEAVY MENSES MAR 2012 HB 13 MCV 86.7 FERRITIN  66  . Arthritis   . Breast cancer ([place])    Right  . Breast mass in female    right  . 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 Hyperlipidemia   . Left rotator cuff tear   . Migraines   . Morbid obesity ([place])   . RUPTURE ROTATOR CUFF 06/08/2007   Qualifier: Diagnosis of  By: Aline Brochure MD, Dorothyann Peng    . Urticaria     Current Outpatient Prescriptions:  .  butalbital-acetaminophen-caffeine (FIORICET, ESGIC) 50-325-40 MG tablet, Take 1 tablet by mouth 2 (two) times daily as needed. For migraine, Disp: 14 tablet, Rfl: 3 .  EPINEPHrine 0.3 mg/0.3 mL IJ SOAJ injection, Inject 0.3 mLs (0.3 mg total) into the muscle once., Disp: 2 Device, Rfl: 1 .  fluticasone (FLONASE) 50 MCG/ACT nasal spray, Place 2 sprays into both nostrils daily., Disp: 16 g, Rfl: 4 .  meclizine (ANTIVERT) 25 MG tablet, Take 1 tablet (25 mg total) by mouth 3 (three) times daily as needed. For dizziness, Disp: 30 tablet, Rfl: 3 .  metFORMIN (GLUCOPHAGE) 500 MG tablet, TAKE 1 TABLET (500 MG TOTAL) BY MOUTH 2 (TWO)  TIMES DAILY WITH A MEAL., Disp: 180 tablet, Rfl: 1 .  Multiple Vitamin (MULITIVITAMIN WITH MINERALS) TABS, Take 1 tablet by mouth daily., Disp: , Rfl:  .  traMADol-acetaminophen (ULTRACET) 37.5-325 MG tablet, Take 1 tablet by mouth every 6 (six) hours as needed., Disp: 60 tablet, Rfl: 5 .  acetaminophen-codeine (TYLENOL #3) 300-30 MG tablet, Take 1 tablet by mouth at bedtime as needed for moderate pain., Disp: 30 tablet, Rfl: 0   Past Surgical History:  Procedure Laterality Date  . BREAST BIOPSY  02/13/2011   Procedure: BREAST BIOPSY WITH NEEDLE LOCALIZATION;  Surgeon: Adin Hector, MD;  Location: [place];  Service: General;  Laterality: Right;  . BREAST SURGERY  11/1997   Left breast cancer-mastectomy  . BREAST SURGERY  2/13-   rt axilly bx  . COLONOSCOPY  2004 DR. SMITH   No polyp, hemorrhoids  . COLONOSCOPY  06/09/10   [id number] adenomas   . COLONOSCOPY N/A 08/19/2015   Procedure: COLONOSCOPY;  Surgeon: Danie Binder, MD;  [facility];  Service: Endoscopy;  Laterality: N/A;  130 - moved to 12:45 - Ginger notified pt  . DILATION AND CURETTAGE OF UTERUS    . DILATION AND CURETTAGE OF UTERUS  05/2002  . hystersonogram  2009  . MASTECTOMY  1999   left breast cancer   .  PORT-A-CATH REMOVAL  10/20/2011   Procedure: MINOR REMOVAL PORT-A-CATH;  Surgeon: Adin Hector, MD;  Location: [place];  Service: General;  Laterality: N/A;  Porta-cath removal  left  . PORTACATH PLACEMENT  03/27/2011   Procedure: INSERTION PORT-A-CATH;  Surgeon: Adin Hector, MD;  Location: [place];  Service: General;  Laterality: Left;  insert port a cath   BP 136/88   Pulse 85   Ht 5\' 6"  (1.676 m)   Wt 293 lb (132.9 kg)   LMP 03/27/2011   BMI 47.29 kg/m   The patient is endomorphic in terms of body habitus she is otherwise normal development without congenital abnormalities. Grooming and hygiene are normal  She is oriented to person place  and time her mood is pleasant her affect is upbeat  Her gait is marked by favoring of the right lower extremity  On the left knee 120 of measured goniometer flexion is noted with full extension. There is no effusion or tenderness. The knee appears to be stable and the coronal and sagittal plane with normal quadriceps strength in extension without lag. Pulse and perfusion are normal there are no sensory deficits and there are no skin lesions  On the right side we have painful flexion up to 85 measurement goniometer. She does reach full extension the medial joint line is tender there is no joint effusion. The medial and lateral collateral ligaments as well as the cruciate ligaments are stable stress testing. There is no atrophy in the quadriceps straight leg raise is normal.  Distally she has normal pulses normal sensation. Right lower extremity has no skin lesions  Plain film shows Tibiofemoral angle is 0. The medial joint space is narrowed probably 75%. The tibial spines have peaking there is an osteophyte on the medial femur lateral tibia lateral femur and posterior femur. There is also a large entheseophyte at the superior edge of the patella  Encounter Diagnosis  Name Primary?  . Primary osteoarthritis of right knee Yes    Plan at this point the patient's BMI is too high for her to have surgery. She says that something needs to be done and so far she has taken Ultracet, she's had an injection as well as aspiration. She is allergic to ibuprofen and does not want to take any NSAIDs which is understandable  However, she is a candidate for unloader brace for right medial knee arthritis. Her quadriceps measurement is 27 inches  She will have a nutrition consult to help with weight loss. She will have physical therapy. We will switch her to Tylenol with codeine for pain she only takes that at night  Her prediabetic status and her BMI over 40 make her at high risk for wound complications and  postop surgical complications and we discussed that this could lead to above the knee amputation.  We will see her when her weight approaches 245 pounds  Meds ordered this encounter  Medications  . acetaminophen-codeine (TYLENOL #3) 300-30 MG tablet    Sig: Take 1 tablet by mouth at bedtime as needed for moderate pain.    Dispense:  30 tablet    Refill:  0

## 2016-07-22 NOTE — Patient Instructions (Addendum)
Call office for refills on medication and once weight is 245    Total Knee Replacement Total knee replacement is a procedure to replace the knee joint with an artificial (prosthetic) knee joint. The purpose of this surgery is to reduce knee pain and improve knee function. The prosthetic knee joint (prosthesis) is usually made of metal and plastic. It replaces parts of the thigh bone (femur), lower leg bone (tibia), and kneecap (patella) that are removed during the procedure. Tell a health care provider about:  Any allergies you have.  All medicines you are taking, including vitamins, herbs, eye drops, creams, and over-the-counter medicines.  Any problems you or family members have had with anesthetic medicines.  Any blood disorders you have.  Any surgeries you have had.  Any medical conditions you have.  Whether you are pregnant or may be pregnant. What are the risks? Generally, this is a safe procedure. However, problems may occur, including:  Infection.  Bleeding.  Allergic reactions to medicines.  Damage to other structures or organs.  Decreased range of motion of the knee.  Instability of the knee.  Loosening of the prosthetic joint.  Knee pain that does not go away (chronic pain).  What happens before the procedure?  Ask your health care provider about: ? Changing or stopping your regular medicines. This is especially important if you are taking diabetes medicines or blood thinners. ? Taking medicines such as aspirin and ibuprofen. These medicines can thin your blood. Do not take these medicines before your procedure if your health care provider instructs you not to.  Have dental care and routine cleanings completed before your procedure. Plan to not have dental work done for 3 months after your procedure. Germs from anywhere in your body, including your mouth, can travel to your new joint and infect it.  Follow instructions from your health care provider about  eating or drinking restrictions.  Ask your health care provider how your surgical site will be marked or identified.  You may be given antibiotic medicine to help prevent infection.  If your health care provider prescribes physical therapy, do exercises as instructed.  Do not use any tobacco products, such as cigarettes, chewing tobacco, or e-cigarettes. If you need help quitting, ask your health care provider.  You may have a physical exam.  You may have tests, such as: ? X-rays. ? MRI. ? CT scan. ? Bone scans.  You may have a blood or urine sample taken.  Plan to have someone take you home after the procedure.  If you will be going home right after the procedure, plan to have someone with you for at least 24 hours. It is recommended that you have someone to help care for you for at least 4-6 weeks after your procedure. What happens during the procedure?  To reduce your risk of infection: ? Your health care team will wash or sanitize their hands. ? Your skin will be washed with soap.  An IV tube will be inserted into one of your veins.  You will be given one or more of the following: ? A medicine to help you relax (sedative). ? A medicine to numb the area (local anesthetic). ? A medicine to make you fall asleep (general anesthetic). ? A medicine that is injected into your spine to numb the area below and slightly above the injection site (spinal anesthetic). ? A medicine that is injected into an area of your body to numb everything below the injection site (regional anesthetic).  An incision will be made in your knee.  Damaged cartilage and bone will be removed from your femur, tibia, and patella.  Parts of the prosthesis (liners)will be placed over the areas of bone and cartilage that were removed. A metal liner will be placed over your femur, and plastic liners will be placed over your tibia and the underside of your patella.  One or more small tubes (drains) may be  placed near your incision to help drain extra fluid from your surgical site.  Your incision will be closed with stitches (sutures), skin glue, or adhesive strips. Medicine may be applied to your incision.  A bandage (dressing) will be placed over your incision. The procedure may vary among health care providers and hospitals. What happens after the procedure?  Your blood pressure, heart rate, breathing rate, and blood oxygen level will be monitored often until the medicines you were given have worn off.  You may continue to receive fluids and medicines through an IV tube.  You will have some pain. Pain medicines will be available to help you.  You may have fluid coming from one or more drains in your incision.  You may have to wear compression stockings. These stockings help to prevent blood clots and reduce swelling in your legs.  You will be encouraged to move around as much as possible.  You may be given a continuous passive motion machine to use at home. You will be shown how to use this machine.  Do not drive for 24 hours if you received a sedative. This information is not intended to replace advice given to you by your health care provider. Make sure you discuss any questions you have with your health care provider. Document Released: 04/27/2000 Document Revised: 09/23/2015 Document Reviewed: 12/26/2014 Elsevier Interactive Patient Education  2017 [employer].

## 2016-08-04 ENCOUNTER — Encounter (HOSPITAL_COMMUNITY): Payer: Self-pay

## 2016-08-04 ENCOUNTER — Ambulatory Visit (HOSPITAL_COMMUNITY): Payer: BLUE CROSS/BLUE SHIELD | Attending: Orthopedic Surgery

## 2016-08-04 DIAGNOSIS — M25561 Pain in right knee: Secondary | ICD-10-CM | POA: Insufficient documentation

## 2016-08-04 DIAGNOSIS — M6281 Muscle weakness (generalized): Secondary | ICD-10-CM | POA: Insufficient documentation

## 2016-08-04 DIAGNOSIS — R29898 Other symptoms and signs involving the musculoskeletal system: Secondary | ICD-10-CM | POA: Insufficient documentation

## 2016-08-04 DIAGNOSIS — R262 Difficulty in walking, not elsewhere classified: Secondary | ICD-10-CM | POA: Diagnosis present

## 2016-08-04 DIAGNOSIS — G8929 Other chronic pain: Secondary | ICD-10-CM | POA: Insufficient documentation

## 2016-08-04 NOTE — Patient Instructions (Signed)
  HEEL SLIDES - LONG SIT WITH TOWEL AND BELT  While in a sitting position, place a small hand towel under your heel. Next, loop a belt, towel or bed sheet around your foot and pull your knee into a bend position as your foot slides towards your buttock. Hold a gentle stretch and then return back to original position.  Perform 1x/day, 2-3 sets of 10   QUAD SET  Tighten your top thigh muscle as you attempt to press the back of your knee downward towards the table.  Perform 1x/day, 2-3 sets of 10 holding for 5 seconds each   Supine HSS with Strap  Start: Position yourself on your back with a strap or towel placed around your foot.  Movement: Pull on the towel to raise your leg and feel a stretch on the back of the leg  Perform 1x/day, 3-5 stretches holding for 30 seconds   Prone Quad Stretch  Lie down flat on your stomach. Wrap a strap (belt, towel, dog leash) around the top of one of your feet and pull the strap across your opposite shoulder so that your knee starts to curl up to your body. Pull until a stretch is felt across the front of your thigh.    Perform 1x/day, 3-5 stretches holding for 30 seconds

## 2016-08-04 NOTE — Therapy (Signed)
[facility] [place], [place], [address] Phone: [phone]   Fax:  [phone]  Physical Therapy Evaluation  Patient Details  Name: Kathleen Reynolds MRN: [id number] Date of Birth: 06-19-1960 Referring Provider: Arther Abbott, MD  Encounter Date: 08/04/2016      PT End of Session - 08/04/16 1536    Visit Number 1   Number of Visits 9   Date for PT Re-Evaluation 09/01/16   Authorization Type BCBS Other   Authorization Time Period 08/04/16 to 09/01/16   PT Start Time 1431   PT Stop Time 1518   PT Time Calculation (min) 47 min   Activity Tolerance Patient tolerated treatment well;No increased pain   Behavior During Therapy WFL for tasks assessed/performed      Past Medical History:  Diagnosis Date  . Anemia LIFELONG   HEAVY MENSES MAR 2012 HB 13 MCV 86.7 FERRITIN  66  . Arthritis   . Breast cancer ([place])    Right  . Breast mass in female    right  . 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 Hyperlipidemia   . Left rotator cuff tear   . Migraines   . Morbid obesity ([place])   . RUPTURE ROTATOR CUFF 06/08/2007   Qualifier: Diagnosis of  By: Aline Brochure MD, Dorothyann Peng    . Urticaria     Past Surgical History:  Procedure Laterality Date  . BREAST BIOPSY  02/13/2011   Procedure: BREAST BIOPSY WITH NEEDLE LOCALIZATION;  Surgeon: Adin Hector, MD;  Location: [facility];  Service: General;  Laterality: Right;  . BREAST SURGERY  11/1997   Left breast cancer-mastectomy  . BREAST SURGERY  2/13-   rt axilly bx  . COLONOSCOPY  2004 DR. SMITH   No polyp, hemorrhoids  . COLONOSCOPY  06/09/10   [id number] adenomas   . COLONOSCOPY N/A 08/19/2015   Procedure: COLONOSCOPY;  Surgeon: Danie Binder, MD;  [facility];  Service: Endoscopy;  Laterality: N/A;  130 - moved to 12:45 - Ginger notified pt  . DILATION AND CURETTAGE OF UTERUS    . DILATION AND CURETTAGE OF UTERUS  05/2002  . hystersonogram  2009  . MASTECTOMY   1999   left breast cancer   . PORT-A-CATH REMOVAL  10/20/2011   Procedure: MINOR REMOVAL PORT-A-CATH;  Surgeon: Adin Hector, MD;  Location: [facility];  Service: General;  Laterality: N/A;  Porta-cath removal  left  . PORTACATH PLACEMENT  03/27/2011   Procedure: INSERTION PORT-A-CATH;  Surgeon: Adin Hector, MD;  Location: [facility];  Service: General;  Laterality: Left;  insert port a cath    There were no vitals filed for this visit.       Subjective Assessment - 08/04/16 1434    Subjective Pt states she has been having R knee pain for over 2 months, it started on Jun 06, 2016. She has difficulty standing for long periods of time, going up/down steps, difficulty crossing it over her L knee, and difficulty going up inclines. She had just started using an [facility] for support after seeing her doctor last week because she had been "hobbling for 2 months." She states that on May 5th, she had been going up/down steps a lot [employer] and she's had increased pain ever since. She wants to be able to walk without hobbling and limping.   Limitations Standing;Walking;House hold activities;Sitting   How long can you sit comfortably? 15-20 mins  How long can you stand comfortably? 10 mins or <   How long can you walk comfortably? quarter of a mile   Patient Stated Goals walk straight, without hobbling or limping   Currently in Pain? No/denies            [facility] PT Assessment - 08/04/16 0001      Assessment   Medical Diagnosis primary osteoarthritis of right knee   Referring Provider Arther Abbott, MD   Onset Date/Surgical Date 06/06/16   Prior Therapy none for present issue, but h/o PT for knee pain, ankle pain     Balance Screen   Has the patient fallen in the past 6 months No   Has the patient had a decrease in activity level because of a fear of falling?  No   Is the patient reluctant to leave their home because of a fear of falling?  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   Leisure dancing, walking     Observation/Other Assessments   Focus on Therapeutic Outcomes (FOTO)  68% limitation     ROM / Strength   AROM / PROM / Strength Strength;AROM     AROM   AROM Assessment Site Knee   Right/Left Knee Right;Left   Right Knee Extension 0   Right Knee Flexion 96   Left Knee Extension 0   Left Knee Flexion 107     Strength   Right Hip Flexion 4+/5   Right Hip Extension 3+/5   Right Hip ABduction 3+/5   Left Hip Flexion 5/5   Left Hip Extension 4/5   Left Hip ABduction 5/5   Right Knee Flexion 4+/5  pain medial joint   Right Knee Extension 4/5  pain medial knee   Left Knee Flexion 5/5   Left Knee Extension 5/5   Right Ankle Dorsiflexion 5/5   Left Ankle Dorsiflexion 5/5     Flexibility   Soft Tissue Assessment /Muscle Length yes   Hamstrings R tight, L WNL   Quadriceps tight BLE     Palpation   Patella mobility mild deficits   Palpation comment increased soft tissue restrictions and tenderness to palpation of distal VLO, VMO, medial joint line, and medial HS     Special Tests    Special Tests Knee Special Tests   Knee Special tests  other;other2     other    Findings Positive   Side  Right   Comments Thessaly's     other   findings Negative   Side Right   Comments McMurray's and Valgus Stress Test     Ambulation/Gait   Ambulation/Gait Yes   Ambulation Distance (Feet) 382 Feet  3MWT   Assistive device None   Gait Pattern Step-through pattern;Decreased stance time - right;Trendelenburg;Antalgic  bil foot ER and decrease hip ext throughout gait     Balance   Balance Assessed Yes     Static Standing Balance   Static Standing - Balance Support No upper extremity supported   Static Standing Balance -  Activities  Single Leg Stance - Right Leg;Single Leg Stance - Left Leg    Static Standing - Comment/# of Minutes R: 18 sec, L: 30 sec with mild unsteadiness     Standardized Balance Assessment   Standardized Balance Assessment Five Times Sit to Stand   Five times sit to stand comments  16.5 sec from chair, no UE; bil anterior knee pain         Objective measurements completed on examination: See above findings.          [facility] Adult PT Treatment/Exercise - 08/04/16 0001      Exercises   Exercises Knee/Hip     Knee/Hip Exercises: Stretches   Active Hamstring Stretch Right;1 rep;30 seconds   Active Hamstring Stretch Limitations supine with sheet   Quad Stretch Right;1 rep;30 seconds   Quad Stretch Limitations prone with sheet     Knee/Hip Exercises: Supine   Quad Sets Right;5 reps   Quad Sets Limitations 3 sec hold   Heel Slides AAROM;Right;5 reps                PT Education - 08/04/16 1536    Education provided Yes   Education Details exam findings, POC, HEP   Person(s) Educated Patient   Methods Explanation;Demonstration;Handout   Comprehension Verbalized understanding;Returned demonstration          PT Short Term Goals - 08/04/16 1557      PT SHORT TERM GOAL #1   Title Pt will be independent and consistently perform HEP to promote return to PLOF and decrease risk of reinjury.    Time 2   Period Weeks   Status New     PT SHORT TERM GOAL #2   Title Pt will have improved R knee AROM to WNL/to be symmetrical with L knee in order to decrease pain and maximize stair ambulation.   Time 2   Period Weeks   Status New     PT SHORT TERM GOAL #3   Title Pt will have improved 5xSTS to 10 seconds or less to demonstrate improved overall BLE strength and maximize return to PLOF.   Time 2   Period Weeks   Status New     PT SHORT TERM GOAL #4   Title Pt will have improved SLS to at least 30 sec with no UE support to maximize gait and demonstrate improved overall hip stability and core strength.   Time 2   Period Weeks   Status New            PT Long Term Goals - 08/04/16 1604      PT LONG TERM GOAL #1   Title Pt will have improved overall BLE strength to 5/5 in order to maximize gait, balance, and function at work.   Time 4   Period Weeks   Status New     PT LONG TERM GOAL #2   Title Pt will have improved 3MWT by at least 124f and with average walking speed of 1.072m or > to maximize function in the community and demonstrate improved overall strength, endurance, and decreased pain.   Time 4   Period Weeks   Status New     PT LONG TERM GOAL #3   Title Pt will report being able to cook and clean dishes for 45 mins or > to demonstrate an improved tolerance to standing to maximzie function at home.   Time 4   Period Weeks   Status New                Plan - 08/04/16 1537    Clinical Impression Statement Pt is pleasant [age]O F who presents to OPPT with c/o R knee pain. She reports that she's always had a little pain in both of her knees, but one day she was going up  and down stairs a lot and ever since she has had increased R knee pain. Pt has mild deficits in knee AROM, R quad and HS strength, bil (R>L) proximal hip musculature strength, gait, balance, and functional strength. Pt also had increased soft tissue restrictions and tenderness to palpation of R knee surrounding muculature and medial joint line. Pt Thessaly's test positive indicating meniscus involvement. Pt would benefit from skilled PT intervention to maximize overall hip, knee and core strength as well as balance and overall function in order to promote return to PLOF.   History and Personal Factors relevant to plan of care: h/o bil knee pain, young, motivated, her pain has already started to get better   Clinical Presentation Stable   Clinical Presentation due to: decreased ROM, decreased knee, hip, and core strength, deficits in gait and balance   Clinical Decision Making Low   Rehab Potential Good   PT Frequency 2x / week   PT Duration 4  weeks   PT Treatment/Interventions ADLs/Self Care Home Management;Electrical Stimulation;Cryotherapy;Gait training;Stair training;Functional mobility training;Therapeutic activities;Therapeutic exercise;Balance training;Neuromuscular re-education;Patient/family education;Manual techniques;Passive range of motion;Dry needling;Taping   PT Next Visit Plan review goals, begin strengthening such as SAQ, SLR, LAQ, bridging, sidelying clams with band, sit <> stands, step ups, side stepping; balance and hip stability; manual to R knee PRN for pain   PT Home Exercise Plan eval: heel slides for ROM, HS stretch, prone quad stretch, quad sets   Consulted and Agree with Plan of Care Patient      Patient will benefit from skilled therapeutic intervention in order to improve the following deficits and impairments:  Abnormal gait, Decreased activity tolerance, Decreased balance, Decreased endurance, Decreased range of motion, Decreased strength, Difficulty walking, Impaired flexibility, Improper body mechanics, Postural dysfunction, Pain, Obesity  Visit Diagnosis: Chronic pain of right knee - Plan: PT plan of care cert/re-cert  Muscle weakness (generalized) - Plan: PT plan of care cert/re-cert  Difficulty in walking, not elsewhere classified - Plan: PT plan of care cert/re-cert  Other symptoms and signs involving the musculoskeletal system - Plan: PT plan of care cert/re-cert     Problem List Patient Active Problem List   Diagnosis Date Noted  . Breast cancer, left breast ([place]) 08/16/2015  . Sciatica of right side associated with disorder of lumbar spine 08/27/2014  . Allergic rhinitis 07/03/2014  . Vertigo, intermittent 05/20/2014  . Nonspecific elevation of levels of transaminase or lactic acid dehydrogenase (LDH) 05/09/2014  . Rotator cuff syndrome 09/19/2013  . Metabolic syndrome X 37/85/8850  . Anemia 02/18/2012  . Obesity, morbid, BMI 40.0-49.9 ([place]) 02/18/2012  . OA (osteoarthritis) of knee  12/29/2011  . Chronic pain of right knee 12/14/2011  . BRCA1 positive 07/01/2011  . Invasive ductal carcinoma of right breast, stage 1 02/18/2011  . Prediabetes 01/22/2011  . Headache 01/01/2010  . Thyroid nodule 05/17/2009  . Dyslipidemia 02/22/2007     Geraldine Solar PT, DPT  [facility] [address] [place], [place], [address] Phone: [phone]   Fax:  [phone]  Name: Kathleen Reynolds MRN: [id number] Date of Birth: [birth year]

## 2016-08-07 ENCOUNTER — Ambulatory Visit (HOSPITAL_COMMUNITY): Payer: BLUE CROSS/BLUE SHIELD

## 2016-08-07 DIAGNOSIS — R29898 Other symptoms and signs involving the musculoskeletal system: Secondary | ICD-10-CM

## 2016-08-07 DIAGNOSIS — M25561 Pain in right knee: Principal | ICD-10-CM

## 2016-08-07 DIAGNOSIS — R262 Difficulty in walking, not elsewhere classified: Secondary | ICD-10-CM

## 2016-08-07 DIAGNOSIS — G8929 Other chronic pain: Secondary | ICD-10-CM

## 2016-08-07 DIAGNOSIS — M6281 Muscle weakness (generalized): Secondary | ICD-10-CM

## 2016-08-07 NOTE — Therapy (Signed)
[facility] [place], [place], [address] Phone: [phone]   Fax:  [phone]  Physical Therapy Treatment  Patient Details  Name: Kathleen Reynolds MRN: [id number] Date of Birth: 02-11-1960 Referring Provider: Arther Abbott, MD  Encounter Date: 08/07/2016      PT End of Session - 08/07/16 1521    Visit Number 2   Number of Visits 9   Date for PT Re-Evaluation 09/01/16   Authorization Type BCBS Other   Authorization Time Period 08/04/16 to 09/01/16   PT Start Time 6644   PT Stop Time 1603   PT Time Calculation (min) 47 min   Activity Tolerance Patient tolerated treatment well;No increased pain   Behavior During Therapy WFL for tasks assessed/performed      Past Medical History:  Diagnosis Date  . Anemia LIFELONG   HEAVY MENSES MAR 2012 HB 13 MCV 86.7 FERRITIN  66  . Arthritis   . Breast cancer ([place])    Right  . Breast mass in female    right  . 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 Hyperlipidemia   . Left rotator cuff tear   . Migraines   . Morbid obesity ([place])   . RUPTURE ROTATOR CUFF 06/08/2007   Qualifier: Diagnosis of  By: Aline Brochure MD, Dorothyann Peng    . Urticaria     Past Surgical History:  Procedure Laterality Date  . BREAST BIOPSY  02/13/2011   Procedure: BREAST BIOPSY WITH NEEDLE LOCALIZATION;  Surgeon: Adin Hector, MD;  Location: [facility];  Service: General;  Laterality: Right;  . BREAST SURGERY  11/1997   Left breast cancer-mastectomy  . BREAST SURGERY  2/13-   rt axilly bx  . COLONOSCOPY  2004 DR. SMITH   No polyp, hemorrhoids  . COLONOSCOPY  06/09/10   [id number] adenomas   . COLONOSCOPY N/A 08/19/2015   Procedure: COLONOSCOPY;  Surgeon: Danie Binder, MD;  [facility];  Service: Endoscopy;  Laterality: N/A;  130 - moved to 12:45 - Ginger notified pt  . DILATION AND CURETTAGE OF UTERUS    . DILATION AND CURETTAGE OF UTERUS  05/2002  . hystersonogram  2009  . MASTECTOMY   1999   left breast cancer   . PORT-A-CATH REMOVAL  10/20/2011   Procedure: MINOR REMOVAL PORT-A-CATH;  Surgeon: Adin Hector, MD;  Location: [facility];  Service: General;  Laterality: N/A;  Porta-cath removal  left  . PORTACATH PLACEMENT  03/27/2011   Procedure: INSERTION PORT-A-CATH;  Surgeon: Adin Hector, MD;  Location: [facility];  Service: General;  Laterality: Left;  insert port a cath    There were no vitals filed for this visit.      Subjective Assessment - 08/07/16 1518    Subjective Pt entered with Rt knee pain scale 6/10.  Stated compliance with HEP with  reports of increased knee cap pain wiht quad stretch, no questions concerning other exercises   Patient Stated Goals walk straight, without hobbling or limping   Currently in Pain? Yes   Pain Score 6    Pain Location Knee   Pain Orientation Right   Pain Descriptors / Indicators Aching;Sore   Pain Type Chronic pain   Pain Onset More than a month ago   Pain Frequency Constant   Aggravating Factors  standing, steps, incline slope   Pain Relieving Factors sitting, ice and pain meds   Effect of Pain on Daily Activities increased  [facility] Adult PT Treatment/Exercise - 08/07/16 0001      Knee/Hip Exercises: Stretches   Active Hamstring Stretch Right;3 reps;30 seconds   Active Hamstring Stretch Limitations supine with sheet   Quad Stretch 2 reps;30 seconds   Quad Stretch Limitations prone with sheet     Knee/Hip Exercises: Supine   Quad Sets Right;10 reps   [employer] Limitations 3-5" holds   Short Arc [employer] Right;10 reps   Heel Slides AAROM;Right;10 reps   Heel Slides Limitations 5" holds   Bridges Limitations 10x with RTB to improve LE alignment   Straight Leg Raises 10 reps   Knee Flexion AROM   Knee Flexion Limitations 103 degrees (was 96 last tx)     Knee/Hip Exercises: Sidelying   Clams 10x BLE wiht RTB     Manual Therapy   Manual Therapy Soft  tissue mobilization   Manual therapy comments Manual complete separate rest of tx   Soft tissue mobilization Lt quad with LE elevated                PT Education - 08/07/16 1528    Education provided Yes   Education Details reviewed goals, assured compliance with HEP and copy of eval given to pt.   Person(s) Educated Patient   Methods Explanation;Demonstration;Handout   Comprehension Verbalized understanding;Returned demonstration          PT Short Term Goals - 08/04/16 1557      PT SHORT TERM GOAL #1   Title Pt will be independent and consistently perform HEP to promote return to PLOF and decrease risk of reinjury.    Time 2   Period Weeks   Status New     PT SHORT TERM GOAL #2   Title Pt will have improved R knee AROM to WNL/to be symmetrical with L knee in order to decrease pain and maximize stair ambulation.   Time 2   Period Weeks   Status New     PT SHORT TERM GOAL #3   Title Pt will have improved 5xSTS to 10 seconds or less to demonstrate improved overall BLE strength and maximize return to PLOF.   Time 2   Period Weeks   Status New     PT SHORT TERM GOAL #4   Title Pt will have improved SLS to at least 30 sec with no UE support to maximize gait and demonstrate improved overall hip stability and core strength.   Time 2   Period Weeks   Status New           PT Long Term Goals - 08/04/16 1604      PT LONG TERM GOAL #1   Title Pt will have improved overall BLE strength to 5/5 in order to maximize gait, balance, and function at work.   Time 4   Period Weeks   Status New     PT LONG TERM GOAL #2   Title Pt will have improved 3MWT by at least 165f and with average walking speed of 1.085m or > to maximize function in the community and demonstrate improved overall strength, endurance, and decreased pain.   Time 4   Period Weeks   Status New     PT LONG TERM GOAL #3   Title Pt will report being able to cook and clean dishes for 45 mins or > to  demonstrate an improved tolerance to standing to maximzie function at home.   Time 4   Period Weeks   Status  New               Plan - 08/07/16 1548    Clinical Impression Statement Reviewed goals, assured compliance and proper form/technique with HEP and copy of eval given to pt.  Session foucs on proximal hip and quad musculature strengthening.  Pt able to demonstrate appropriate form wtih all exercises with minimal cueing for form especially with prone quad stretch to reduce rotation with stretch.  EOS iwth manual techniques to address tight musculature noted quadricep region.  No reports of increased pain.  Improved AROM with 103 degrees flexion.   Rehab Potential Good   PT Frequency 2x / week   PT Duration 4 weeks   PT Treatment/Interventions ADLs/Self Care Home Management;Electrical Stimulation;Cryotherapy;Gait training;Stair training;Functional mobility training;Therapeutic activities;Therapeutic exercise;Balance training;Neuromuscular re-education;Patient/family education;Manual techniques;Passive range of motion;Dry needling;Taping   PT Next Visit Plan Continue quad strengthening and ROM exercises including heel slides, SAQ, SLR, LAQ, bridging, sidelying clams with band; next session progress to sit <> stands, step ups, side stepping; balance and hip stability; manual to R knee PRN for pain   PT Home Exercise Plan eval: heel slides for ROM, HS stretch, prone quad stretch, quad sets      Patient will benefit from skilled therapeutic intervention in order to improve the following deficits and impairments:  Abnormal gait, Decreased activity tolerance, Decreased balance, Decreased endurance, Decreased range of motion, Decreased strength, Difficulty walking, Impaired flexibility, Improper body mechanics, Postural dysfunction, Pain, Obesity  Visit Diagnosis: Chronic pain of right knee  Muscle weakness (generalized)  Difficulty in walking, not elsewhere classified  Other symptoms  and signs involving the musculoskeletal system     Problem List Patient Active Problem List   Diagnosis Date Noted  . Breast cancer, left breast ([facility]) 08/16/2015  . Sciatica of right side associated with disorder of lumbar spine 08/27/2014  . Allergic rhinitis 07/03/2014  . Vertigo, intermittent 05/20/2014  . Nonspecific elevation of levels of transaminase or lactic acid dehydrogenase (LDH) 05/09/2014  . Rotator cuff syndrome 09/19/2013  . Metabolic syndrome X 23/34/3568  . Anemia 02/18/2012  . Obesity, morbid, BMI 40.0-49.9 ([facility]) 02/18/2012  . OA (osteoarthritis) of knee 12/29/2011  . Chronic pain of right knee 12/14/2011  . BRCA1 positive 07/01/2011  . Invasive ductal carcinoma of right breast, stage 1 02/18/2011  . Prediabetes 01/22/2011  . Headache 01/01/2010  . Thyroid nodule 05/17/2009  . Dyslipidemia 02/22/2007   Ihor Austin, [place]; John Day  Aldona Lento 08/07/2016, 4:45 PM  [facility] [address] [place], [place], [address] Phone: [phone]   Fax:  [phone]  Name: Kathleen Reynolds MRN: [id number] Date of Birth: Jun 28, 1960

## 2016-08-11 ENCOUNTER — Ambulatory Visit (HOSPITAL_COMMUNITY): Payer: BLUE CROSS/BLUE SHIELD

## 2016-08-11 DIAGNOSIS — R29898 Other symptoms and signs involving the musculoskeletal system: Secondary | ICD-10-CM

## 2016-08-11 DIAGNOSIS — G8929 Other chronic pain: Secondary | ICD-10-CM

## 2016-08-11 DIAGNOSIS — M25561 Pain in right knee: Principal | ICD-10-CM

## 2016-08-11 DIAGNOSIS — M6281 Muscle weakness (generalized): Secondary | ICD-10-CM

## 2016-08-11 DIAGNOSIS — R262 Difficulty in walking, not elsewhere classified: Secondary | ICD-10-CM

## 2016-08-11 NOTE — Therapy (Signed)
[place], [place], [address] Phone: [phone]   Fax:  [phone]  Physical Therapy Treatment  Patient Details  Name: Kathleen Reynolds MRN: [id number] Date of Birth: November 26, 1960 Referring Provider: Arther Abbott, MD  Encounter Date: 08/11/2016      PT End of Session - 08/11/16 1521    Visit Number 3   Number of Visits 9   Date for PT Re-Evaluation 09/01/16   Authorization Type BCBS Other   Authorization Time Period 08/04/16 to 09/01/16   PT Start Time 8757   PT Stop Time 1604   PT Time Calculation (min) 47 min   Activity Tolerance Patient tolerated treatment well;No increased pain   Behavior During Therapy WFL for tasks assessed/performed      Past Medical History:  Diagnosis Date  . Anemia LIFELONG   HEAVY MENSES MAR 2012 HB 13 MCV 86.7 FERRITIN  66  . Arthritis   . Breast cancer ([facility])    Right  . Breast mass in female    right  . 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 Hyperlipidemia   . Left rotator cuff tear   . Migraines   . Morbid obesity ([place])   . RUPTURE ROTATOR CUFF 06/08/2007   Qualifier: Diagnosis of  By: Aline Brochure MD, Dorothyann Peng    . Urticaria     Past Surgical History:  Procedure Laterality Date  . BREAST BIOPSY  02/13/2011   Procedure: BREAST BIOPSY WITH NEEDLE LOCALIZATION;  Surgeon: Adin Hector, MD;  Location: [facility];  Service: General;  Laterality: Right;  . BREAST SURGERY  11/1997   Left breast cancer-mastectomy  . BREAST SURGERY  2/13-   rt axilly bx  . COLONOSCOPY  2004 DR. SMITH   No polyp, hemorrhoids  . COLONOSCOPY  06/09/10   [id number] adenomas   . COLONOSCOPY N/A 08/19/2015   Procedure: COLONOSCOPY;  Surgeon: Danie Binder, MD;  [facility];  Service: Endoscopy;  Laterality: N/A;  130 - moved to 12:45 - Ginger notified pt  . DILATION AND CURETTAGE OF UTERUS    . DILATION AND CURETTAGE OF UTERUS  05/2002  . hystersonogram  2009  . MASTECTOMY   1999   left breast cancer   . PORT-A-CATH REMOVAL  10/20/2011   Procedure: MINOR REMOVAL PORT-A-CATH;  Surgeon: Adin Hector, MD;  Location: [facility];  Service: General;  Laterality: N/A;  Porta-cath removal  left  . PORTACATH PLACEMENT  03/27/2011   Procedure: INSERTION PORT-A-CATH;  Surgeon: Adin Hector, MD;  Location: [facility];  Service: General;  Laterality: Left;  insert port a cath    There were no vitals filed for this visit.      Subjective Assessment - 08/11/16 1519    Subjective Pt stated she woke up with increased pain on medial aspect of knee this morning, no reports of recent injury.  Compliance wiht HEP daily.     Patient Stated Goals walk straight, without hobbling or limping   Currently in Pain? Yes   Pain Score 5    Pain Location Knee   Pain Orientation Right;Medial   Pain Descriptors / Indicators Aching   Pain Type Chronic pain   Pain Onset More than a month ago   Pain Frequency Constant   Aggravating Factors  standing, steps, incline slope   Pain Relieving Factors sitting, ice and pain meds   Effect of Pain on Daily Activities increase  [facility] PT Treatment/Exercise - 08/11/16 0001      Knee/Hip Exercises: Stretches   Active Hamstring Stretch Right;3 reps;30 seconds   Active Hamstring Stretch Limitations supine with sheet   Quad Stretch 2 reps;30 seconds   Quad Stretch Limitations prone with sheet     Knee/Hip Exercises: Seated   Long Arc Quad 10 reps   Heel Slides 10 reps   Sit to [employer] 10 reps;without UE support  eccentric control     Knee/Hip Exercises: Supine   Short Arc Quad Sets Right;15 reps   Bridges Limitations 15   Straight Leg Raises 10 reps   Knee Flexion AROM   Knee Flexion Limitations 105 degrees     Knee/Hip Exercises: Sidelying   Hip ABduction 15 reps   Clams 10x BLE wiht RTB     Knee/Hip Exercises: Prone   Hip Extension 15 reps     Manual  Therapy   Manual Therapy Soft tissue mobilization   Manual therapy comments Manual complete separate rest of tx   Soft tissue mobilization Rt quad with LE elevated manual and use of rolling stick                  PT Short Term Goals - 08/04/16 1557      PT SHORT TERM GOAL #1   Title Pt will be independent and consistently perform HEP to promote return to PLOF and decrease risk of reinjury.    Time 2   Period Weeks   Status New     PT SHORT TERM GOAL #2   Title Pt will have improved R knee AROM to WNL/to be symmetrical with L knee in order to decrease pain and maximize stair ambulation.   Time 2   Period Weeks   Status New     PT SHORT TERM GOAL #3   Title Pt will have improved 5xSTS to 10 seconds or less to demonstrate improved overall BLE strength and maximize return to PLOF.   Time 2   Period Weeks   Status New     PT SHORT TERM GOAL #4   Title Pt will have improved SLS to at least 30 sec with no UE support to maximize gait and demonstrate improved overall hip stability and core strength.   Time 2   Period Weeks   Status New           PT Long Term Goals - 08/04/16 1604      PT LONG TERM GOAL #1   Title Pt will have improved overall BLE strength to 5/5 in order to maximize gait, balance, and function at work.   Time 4   Period Weeks   Status New     PT LONG TERM GOAL #2   Title Pt will have improved 3MWT by at least 114f and with average walking speed of 1.045m or > to maximize function in the community and demonstrate improved overall strength, endurance, and decreased pain.   Time 4   Period Weeks   Status New     PT LONG TERM GOAL #3   Title Pt will report being able to cook and clean dishes for 45 mins or > to demonstrate an improved tolerance to standing to maximzie function at home.   Time 4   Period Weeks   Status New               Plan - 08/11/16 1613    Clinical Impression Statement Session focus on proximal hip  and quad  strengthening wiht additional SLR in sidelying and prone to improve hip strengthening.  Pt able to complete all exercises with minimal cueing for form and control.  Manual soft tissue mobilitzation technqiues were complete to address tightness in Rt quad, pt will continue to benefit from manual techniuqes to address tightess no resolved this session.  No reports of pain through session.     Rehab Potential Good   PT Frequency 2x / week   PT Duration 4 weeks   PT Treatment/Interventions ADLs/Self Care Home Management;Electrical Stimulation;Cryotherapy;Gait training;Stair training;Functional mobility training;Therapeutic activities;Therapeutic exercise;Balance training;Neuromuscular re-education;Patient/family education;Manual techniques;Passive range of motion;Dry needling;Taping   PT Next Visit Plan Next session progress to CKC exercises for quad/hip strengthening and ROM.  Next session begin knee drives, step ups, sidestepping; balance and hip stability; continue with manual to Rt knee PRN for pain.     PT Home Exercise Plan eval: heel slides for ROM, HS stretch, prone quad stretch, quad sets      Patient will benefit from skilled therapeutic intervention in order to improve the following deficits and impairments:  Abnormal gait, Decreased activity tolerance, Decreased balance, Decreased endurance, Decreased range of motion, Decreased strength, Difficulty walking, Impaired flexibility, Improper body mechanics, Postural dysfunction, Pain, Obesity  Visit Diagnosis: Chronic pain of right knee  Muscle weakness (generalized)  Difficulty in walking, not elsewhere classified  Other symptoms and signs involving the musculoskeletal system     Problem List Patient Active Problem List   Diagnosis Date Noted  . Breast cancer, left breast ([facility]) 08/16/2015  . Sciatica of right side associated with disorder of lumbar spine 08/27/2014  . Allergic rhinitis 07/03/2014  . Vertigo, intermittent 05/20/2014   . Nonspecific elevation of levels of transaminase or lactic acid dehydrogenase (LDH) 05/09/2014  . Rotator cuff syndrome 09/19/2013  . Metabolic syndrome X 01/49/9692  . Anemia 02/18/2012  . Obesity, morbid, BMI 40.0-49.9 ([facility]) 02/18/2012  . OA (osteoarthritis) of knee 12/29/2011  . Chronic pain of right knee 12/14/2011  . BRCA1 positive 07/01/2011  . Invasive ductal carcinoma of right breast, stage 1 02/18/2011  . Prediabetes 01/22/2011  . Headache 01/01/2010  . Thyroid nodule 05/17/2009  . Dyslipidemia 02/22/2007   Ihor Austin, [place]; [place]  Aldona Lento 08/11/2016, 4:19 PM  [place], [place], [address] Phone: [phone]   Fax:  [phone]  Name: Kathleen Reynolds MRN: [id number] Date of Birth: May 06, 1960

## 2016-08-13 ENCOUNTER — Ambulatory Visit (HOSPITAL_COMMUNITY): Payer: BLUE CROSS/BLUE SHIELD | Admitting: Physical Therapy

## 2016-08-13 DIAGNOSIS — R29898 Other symptoms and signs involving the musculoskeletal system: Secondary | ICD-10-CM

## 2016-08-13 DIAGNOSIS — M25561 Pain in right knee: Secondary | ICD-10-CM | POA: Diagnosis not present

## 2016-08-13 DIAGNOSIS — R262 Difficulty in walking, not elsewhere classified: Secondary | ICD-10-CM

## 2016-08-13 DIAGNOSIS — G8929 Other chronic pain: Secondary | ICD-10-CM

## 2016-08-13 DIAGNOSIS — M6281 Muscle weakness (generalized): Secondary | ICD-10-CM

## 2016-08-13 NOTE — Therapy (Signed)
[place], [place], [address] Phone: [phone]   Fax:  [phone]  Physical Therapy Treatment  Patient Details  Name: Kathleen Reynolds MRN: [id number] Date of Birth: [birth year] Referring Provider: Arther Abbott, MD  Encounter Date: 08/13/2016      PT End of Session - 08/13/16 1730    Visit Number 4   Number of Visits 9   Date for PT Re-Evaluation 09/01/16   Authorization Type BCBS Other   Authorization Time Period 08/04/16 to 09/01/16   PT Start Time 1648   PT Stop Time 1729   PT Time Calculation (min) 41 min   Activity Tolerance Patient tolerated treatment well;No increased pain   Behavior During Therapy WFL for tasks assessed/performed      Past Medical History:  Diagnosis Date  . Anemia LIFELONG   HEAVY MENSES MAR 2012 HB 13 MCV 86.7 FERRITIN  66  . Arthritis   . Breast cancer ([facility])    Right  . Breast mass in female    right  . 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 Hyperlipidemia   . Left rotator cuff tear   . Migraines   . Morbid obesity ([facility])   . RUPTURE ROTATOR CUFF 06/08/2007   Qualifier: Diagnosis of  By: Aline Brochure MD, Dorothyann Peng    . Urticaria     Past Surgical History:  Procedure Laterality Date  . BREAST BIOPSY  02/13/2011   Procedure: BREAST BIOPSY WITH NEEDLE LOCALIZATION;  Surgeon: Adin Hector, MD;  Location: [facility];  Service: General;  Laterality: Right;  . BREAST SURGERY  11/1997   Left breast cancer-mastectomy  . BREAST SURGERY  2/13-   rt axilly bx  . COLONOSCOPY  2004 DR. SMITH   No polyp, hemorrhoids  . COLONOSCOPY  06/09/10   [id number]:[id number] adenomas   . COLONOSCOPY N/A 08/19/2015   Procedure: COLONOSCOPY;  Surgeon: Danie Binder, MD;  [facility];  Service: Endoscopy;  Laterality: N/A;  130 - moved to 12:45 - Ginger notified pt  . DILATION AND CURETTAGE OF UTERUS    . DILATION AND CURETTAGE OF UTERUS  05/2002  . hystersonogram  2009  . MASTECTOMY   1999   left breast cancer   . PORT-A-CATH REMOVAL  10/20/2011   Procedure: MINOR REMOVAL PORT-A-CATH;  Surgeon: Adin Hector, MD;  Location: [facility];  Service: General;  Laterality: N/A;  Porta-cath removal  left  . PORTACATH PLACEMENT  03/27/2011   Procedure: INSERTION PORT-A-CATH;  Surgeon: Adin Hector, MD;  Location: [facility];  Service: General;  Laterality: Left;  insert port a cath    There were no vitals filed for this visit.      Subjective Assessment - 08/13/16 1648    Subjective Patient arrives in good spirits today, no major changes since last session and she was sore after last session    Patient Stated Goals walk straight, without hobbling or limping   Currently in Pain? No/denies                         [facility] Adult PT Treatment/Exercise - 08/13/16 0001      Knee/Hip Exercises: Stretches   Active Hamstring Stretch Right;3 reps;30 seconds   Active Hamstring Stretch Limitations 12 inch box    Knee: Self-Stretch Limitations x10 reps, 2 second holds    Press photographer Both;3 reps;30 seconds   Gastroc Stretch Limitations slantboard  Knee/Hip Exercises: Standing   Heel Raises Both;1 set;10 reps   Lateral Step Up Both;1 set;15 reps   Lateral Step Up Limitations 4 inch box    Forward Step Up Both;1 set;15 reps   Forward Step Up Limitations 4 inch box    Step Down Both;1 set;10 reps   Step Down Limitations 4 inch box, B UEs    Other Standing Knee Exercises eccentric stand to sit x10 3 second count    Other Standing Knee Exercises hip hikes with and without swing 2x10 B              Balance Exercises - 08/13/16 1721      Balance Exercises: Standing   Tandem Stance Eyes open;Foam/compliant surface;3 reps;15 secs   SLS Eyes open;Solid surface;3 reps;10 secs           PT Education - 08/13/16 1730    Education provided Yes   Education Details implications of progression of activities this session;  possible soreness following this session and relation to building muscle/strength; ice when she gets home    Person(s) Educated Patient   Methods Explanation   Comprehension Verbalized understanding          PT Short Term Goals - 08/04/16 1557      PT SHORT TERM GOAL #1   Title Pt will be independent and consistently perform HEP to promote return to PLOF and decrease risk of reinjury.    Time 2   Period Weeks   Status New     PT SHORT TERM GOAL #2   Title Pt will have improved R knee AROM to WNL/to be symmetrical with L knee in order to decrease pain and maximize stair ambulation.   Time 2   Period Weeks   Status New     PT SHORT TERM GOAL #3   Title Pt will have improved 5xSTS to 10 seconds or less to demonstrate improved overall BLE strength and maximize return to PLOF.   Time 2   Period Weeks   Status New     PT SHORT TERM GOAL #4   Title Pt will have improved SLS to at least 30 sec with no UE support to maximize gait and demonstrate improved overall hip stability and core strength.   Time 2   Period Weeks   Status New           PT Long Term Goals - 08/04/16 1604      PT LONG TERM GOAL #1   Title Pt will have improved overall BLE strength to 5/5 in order to maximize gait, balance, and function at work.   Time 4   Period Weeks   Status New     PT LONG TERM GOAL #2   Title Pt will have improved 3MWT by at least 136f and with average walking speed of 1.046m or > to maximize function in the community and demonstrate improved overall strength, endurance, and decreased pain.   Time 4   Period Weeks   Status New     PT LONG TERM GOAL #3   Title Pt will report being able to cook and clean dishes for 45 mins or > to demonstrate an improved tolerance to standing to maximzie function at home.   Time 4   Period Weeks   Status New               Plan - 08/13/16 1731    Clinical Impression Statement Patient arrives today in good spirits  and reports no pain  in her knee today. Progressed interventions as indicated in POC, introducing stronger focus on CKC functional strength and balance today. Patient appears to be improving with skilled PT services thus far. Noted considerable weakness in R LE today as evidenced by significant muscle fatigue with functional CKC activities this session.    Rehab Potential Good   PT Frequency 2x / week   PT Duration 4 weeks   PT Treatment/Interventions ADLs/Self Care Home Management;Electrical Stimulation;Cryotherapy;Gait training;Stair training;Functional mobility training;Therapeutic activities;Therapeutic exercise;Balance training;Neuromuscular re-education;Patient/family education;Manual techniques;Passive range of motion;Dry needling;Taping   PT Next Visit Plan continue CKC strength exercises, continue to moinotor ROM and intervene if needed. Continue balance training. Manual PRN. Consider HEP update.    PT Home Exercise Plan eval: heel slides for ROM, HS stretch, prone quad stretch, quad sets   Consulted and Agree with Plan of Care Patient      Patient will benefit from skilled therapeutic intervention in order to improve the following deficits and impairments:  Abnormal gait, Decreased activity tolerance, Decreased balance, Decreased endurance, Decreased range of motion, Decreased strength, Difficulty walking, Impaired flexibility, Improper body mechanics, Postural dysfunction, Pain, Obesity  Visit Diagnosis: Chronic pain of right knee  Muscle weakness (generalized)  Difficulty in walking, not elsewhere classified  Other symptoms and signs involving the musculoskeletal system     Problem List Patient Active Problem List   Diagnosis Date Noted  . Breast cancer, left breast ([facility]) 08/16/2015  . Sciatica of right side associated with disorder of lumbar spine 08/27/2014  . Allergic rhinitis 07/03/2014  . Vertigo, intermittent 05/20/2014  . Nonspecific elevation of levels of transaminase or lactic acid  dehydrogenase (LDH) 05/09/2014  . Rotator cuff syndrome 09/19/2013  . Metabolic syndrome X 69/62/9528  . Anemia 02/18/2012  . Obesity, morbid, BMI 40.0-49.9 ([facility]) 02/18/2012  . OA (osteoarthritis) of knee 12/29/2011  . Chronic pain of right knee 12/14/2011  . BRCA1 positive 07/01/2011  . Invasive ductal carcinoma of right breast, stage 1 02/18/2011  . Prediabetes 01/22/2011  . Headache 01/01/2010  . Thyroid nodule 05/17/2009  . Dyslipidemia 02/22/2007    Deniece Ree PT, DPT [phone]  [facility] [address] [place], [place], [address] Phone: [phone]   Fax:  [phone]  Name: Kaliope Quinonez MRN: [id number] Date of Birth: Apr 16, 1960

## 2016-08-18 ENCOUNTER — Ambulatory Visit (HOSPITAL_COMMUNITY): Payer: BLUE CROSS/BLUE SHIELD

## 2016-08-18 DIAGNOSIS — M25561 Pain in right knee: Principal | ICD-10-CM

## 2016-08-18 DIAGNOSIS — G8929 Other chronic pain: Secondary | ICD-10-CM

## 2016-08-18 DIAGNOSIS — M6281 Muscle weakness (generalized): Secondary | ICD-10-CM

## 2016-08-18 DIAGNOSIS — R262 Difficulty in walking, not elsewhere classified: Secondary | ICD-10-CM

## 2016-08-18 DIAGNOSIS — R29898 Other symptoms and signs involving the musculoskeletal system: Secondary | ICD-10-CM

## 2016-08-18 NOTE — Therapy (Signed)
[facility] [address] [place], [place], [address] Phone: [phone]   Fax:  [phone]  Physical Therapy Treatment  Patient Details  Name: Kathleen Reynolds MRN: [id number] Date of Birth: 1960-04-26 Referring Provider: Arther Abbott, MD  Encounter Date: 08/18/2016      PT End of Session - 08/18/16 1525    Visit Number 5   Number of Visits 9   Date for PT Re-Evaluation 09/01/16   Authorization Type BCBS Other   Authorization Time Period 08/04/16 to 09/01/16   PT Start Time 1521   PT Stop Time 1559   PT Time Calculation (min) 38 min   Activity Tolerance Patient tolerated treatment well;No increased pain   Behavior During Therapy WFL for tasks assessed/performed      Past Medical History:  Diagnosis Date  . Anemia LIFELONG   HEAVY MENSES MAR 2012 HB 13 MCV 86.7 FERRITIN  66  . Arthritis   . Breast cancer ([place])    Right  . Breast mass in female    right  . 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 Hyperlipidemia   . Left rotator cuff tear   . Migraines   . Morbid obesity ([place])   . RUPTURE ROTATOR CUFF 06/08/2007   Qualifier: Diagnosis of  By: Aline Brochure MD, Dorothyann Peng    . Urticaria     Past Surgical History:  Procedure Laterality Date  . BREAST BIOPSY  02/13/2011   Procedure: BREAST BIOPSY WITH NEEDLE LOCALIZATION;  Surgeon: Adin Hector, MD;  Location: [facility];  Service: General;  Laterality: Right;  . BREAST SURGERY  11/1997   Left breast cancer-mastectomy  . BREAST SURGERY  2/13-   rt axilly bx  . COLONOSCOPY  2004 DR. SMITH   No polyp, hemorrhoids  . COLONOSCOPY  06/09/10   RFF:[id number] adenomas   . COLONOSCOPY N/A 08/19/2015   Procedure: COLONOSCOPY;  Surgeon: Danie Binder, MD;  [facility];  Service: Endoscopy;  Laterality: N/A;  130 - moved to 12:45 - Ginger notified pt  . DILATION AND CURETTAGE OF UTERUS    . DILATION AND CURETTAGE OF UTERUS  05/2002  . hystersonogram  2009  . MASTECTOMY   1999   left breast cancer   . PORT-A-CATH REMOVAL  10/20/2011   Procedure: MINOR REMOVAL PORT-A-CATH;  Surgeon: Adin Hector, MD;  Location: [facility];  Service: General;  Laterality: N/A;  Porta-cath removal  left  . PORTACATH PLACEMENT  03/27/2011   Procedure: INSERTION PORT-A-CATH;  Surgeon: Adin Hector, MD;  Location: [facility];  Service: General;  Laterality: Left;  insert port a cath    There were no vitals filed for this visit.      Subjective Assessment - 08/18/16 1522    Subjective Pt reports increased pain following wedding dress shopping wiht family over weekend, current 4/10 stiff and soreness.  Increased edema present knee to ankle   Patient Stated Goals walk straight, without hobbling or limping   Currently in Pain? Yes   Pain Score 4    Pain Location Knee   Pain Orientation Right   Pain Descriptors / Indicators Sore;Tightness   Pain Type Chronic pain   Pain Onset More than a month ago   Pain Frequency Constant   Aggravating Factors  standing, steps, incline slope   Pain Relieving Factors sitting, ice and pain meds   Effect of Pain on Daily Activities increase  [facility] PT Assessment - 08/18/16 0001      Assessment   Medical Diagnosis primary osteoarthritis of right knee   Referring Provider Arther Abbott, MD   Onset Date/Surgical Date 06/06/16   Prior Therapy none for present issue, but h/o PT for knee pain, ankle pain     AROM   Right Knee Flexion 99   Left Knee Flexion 107                     [facility] Adult PT Treatment/Exercise - 08/18/16 0001      Knee/Hip Exercises: Stretches   Knee: Self-Stretch Limitations knee drive on 2nd step 12X 5" holds     Knee/Hip Exercises: Standing   Lateral Step Up Both;1 set;15 reps   Lateral Step Up Limitations 4 inch box    Forward Step Up Both;10 reps;Hand Hold: 1;Step Height: 6"   Forward Step Up Limitations 6in step   Step Down Both;1 set;10 reps    Step Down Limitations 4 inch box, B UEs      Manual Therapy   Manual Therapy Edema management   Manual therapy comments Manual complete separate rest of tx             Balance Exercises - 08/18/16 1559      Balance Exercises: Standing   Tandem Stance Eyes open;Foam/compliant surface;Intermittent upper extremity support;3 reps;30 secs             PT Short Term Goals - 08/04/16 1557      PT SHORT TERM GOAL #1   Title Pt will be independent and consistently perform HEP to promote return to PLOF and decrease risk of reinjury.    Time 2   Period Weeks   Status New     PT SHORT TERM GOAL #2   Title Pt will have improved R knee AROM to WNL/to be symmetrical with L knee in order to decrease pain and maximize stair ambulation.   Time 2   Period Weeks   Status New     PT SHORT TERM GOAL #3   Title Pt will have improved 5xSTS to 10 seconds or less to demonstrate improved overall BLE strength and maximize return to PLOF.   Time 2   Period Weeks   Status New     PT SHORT TERM GOAL #4   Title Pt will have improved SLS to at least 30 sec with no UE support to maximize gait and demonstrate improved overall hip stability and core strength.   Time 2   Period Weeks   Status New           PT Long Term Goals - 08/04/16 1604      PT LONG TERM GOAL #1   Title Pt will have improved overall BLE strength to 5/5 in order to maximize gait, balance, and function at work.   Time 4   Period Weeks   Status New     PT LONG TERM GOAL #2   Title Pt will have improved 3MWT by at least 123f and with average walking speed of 1.080m or > to maximize function in the community and demonstrate improved overall strength, endurance, and decreased pain.   Time 4   Period Weeks   Status New     PT LONG TERM GOAL #3   Title Pt will report being able to cook and clean dishes for 45 mins or > to demonstrate an improved tolerance to standing to maximzie function at home.  Time 4   Period  Weeks   Status New               Plan - 08/18/16 1539    Clinical Impression Statement Pt arrived with increased edema present following reports of wedding dress shopping over weekend with a lot of walking and standing.  Began session wiht manual retro massage for edema control.   Continued functional strengthening therex in CKC activities, pt able to demonstrate appropriate form and technique with all therex.  Able to increase step up height for functional strengthening.  Pt continues to show weakness with visible fatigue with task.     Rehab Potential Good   PT Frequency 2x / week   PT Duration 4 weeks   PT Treatment/Interventions ADLs/Self Care Home Management;Electrical Stimulation;Cryotherapy;Gait training;Stair training;Functional mobility training;Therapeutic activities;Therapeutic exercise;Balance training;Neuromuscular re-education;Patient/family education;Manual techniques;Passive range of motion;Dry needling;Taping   PT Next Visit Plan continue CKC strength exercises, continue to moinotor ROM and intervene if needed. Continue balance training. Manual PRN. Consider HEP update.    PT Home Exercise Plan eval: heel slides for ROM, HS stretch, prone quad stretch, quad sets      Patient will benefit from skilled therapeutic intervention in order to improve the following deficits and impairments:  Abnormal gait, Decreased activity tolerance, Decreased balance, Decreased endurance, Decreased range of motion, Decreased strength, Difficulty walking, Impaired flexibility, Improper body mechanics, Postural dysfunction, Pain, Obesity  Visit Diagnosis: Chronic pain of right knee  Muscle weakness (generalized)  Difficulty in walking, not elsewhere classified  Other symptoms and signs involving the musculoskeletal system     Problem List Patient Active Problem List   Diagnosis Date Noted  . Breast cancer, left breast ([facility]) 08/16/2015  . Sciatica of right side associated with  disorder of lumbar spine 08/27/2014  . Allergic rhinitis 07/03/2014  . Vertigo, intermittent 05/20/2014  . Nonspecific elevation of levels of transaminase or lactic acid dehydrogenase (LDH) 05/09/2014  . Rotator cuff syndrome 09/19/2013  . Metabolic syndrome X 07/02/2012  . Anemia 02/18/2012  . Obesity, morbid, BMI 40.0-49.9 (HCC) 02/18/2012  . OA (osteoarthritis) of knee 12/29/2011  . Chronic pain of right knee 12/14/2011  . BRCA1 positive 07/01/2011  . Invasive ductal carcinoma of right breast, stage 1 02/18/2011  . Prediabetes 01/22/2011  . Headache 01/01/2010  . Thyroid nodule 05/17/2009  . Dyslipidemia 02/22/2007   Casey Cockerham, LPTA; CBIS [phone]  Cockerham, Casey Jo 08/18/2016, 4:02 PM  [facility] [address] [place], [place], [address] Phone: [phone]   Fax:  [phone]  Name: Shakaria Geier MRN: [id number] Date of Birth: 09/17/1960   

## 2016-08-20 ENCOUNTER — Ambulatory Visit (HOSPITAL_COMMUNITY): Payer: BLUE CROSS/BLUE SHIELD | Admitting: Physical Therapy

## 2016-08-20 DIAGNOSIS — G8929 Other chronic pain: Secondary | ICD-10-CM

## 2016-08-20 DIAGNOSIS — M25561 Pain in right knee: Principal | ICD-10-CM

## 2016-08-20 DIAGNOSIS — R262 Difficulty in walking, not elsewhere classified: Secondary | ICD-10-CM

## 2016-08-20 DIAGNOSIS — M6281 Muscle weakness (generalized): Secondary | ICD-10-CM

## 2016-08-20 DIAGNOSIS — R29898 Other symptoms and signs involving the musculoskeletal system: Secondary | ICD-10-CM

## 2016-08-20 NOTE — Therapy (Signed)
[facility] [place], [place], [address] Phone: [phone]   Fax:  [phone]  Physical Therapy Treatment  Patient Details  Name: Kathleen Reynolds MRN: [id number] Date of Birth: 06-25-1960 Referring Provider: Arther Abbott, MD  Encounter Date: 08/20/2016      PT End of Session - 08/20/16 1538    Visit Number 6   Number of Visits 9   Date for PT Re-Evaluation 09/01/16   Authorization Type BCBS Other   Authorization Time Period 08/04/16 to 09/01/16   PT Start Time 1432   PT Stop Time 1512   PT Time Calculation (min) 40 min   Activity Tolerance Patient tolerated treatment well;No increased pain   Behavior During Therapy WFL for tasks assessed/performed      Past Medical History:  Diagnosis Date  . Anemia LIFELONG   HEAVY MENSES MAR 2012 HB 13 MCV 86.7 FERRITIN  66  . Arthritis   . Breast cancer ([facility])    Right  . Breast mass in female    right  . 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 Hyperlipidemia   . Left rotator cuff tear   . Migraines   . Morbid obesity ([place])   . RUPTURE ROTATOR CUFF 06/08/2007   Qualifier: Diagnosis of  By: Aline Brochure MD, Dorothyann Peng    . Urticaria     Past Surgical History:  Procedure Laterality Date  . BREAST BIOPSY  02/13/2011   Procedure: BREAST BIOPSY WITH NEEDLE LOCALIZATION;  Surgeon: Adin Hector, MD;  Location: [facility];  Service: General;  Laterality: Right;  . BREAST SURGERY  11/1997   Left breast cancer-mastectomy  . BREAST SURGERY  2/13-   rt axilly bx  . COLONOSCOPY  2004 DR. SMITH   No polyp, hemorrhoids  . COLONOSCOPY  06/09/10   FOY:DXAJOI adenomas   . COLONOSCOPY N/A 08/19/2015   Procedure: COLONOSCOPY;  Surgeon: Danie Binder, MD;  [facility];  Service: Endoscopy;  Laterality: N/A;  130 - moved to 12:45 - Ginger notified pt  . DILATION AND CURETTAGE OF UTERUS    . DILATION AND CURETTAGE OF UTERUS  05/2002  . hystersonogram  2009  . MASTECTOMY   1999   left breast cancer   . PORT-A-CATH REMOVAL  10/20/2011   Procedure: MINOR REMOVAL PORT-A-CATH;  Surgeon: Adin Hector, MD;  Location: [facility];  Service: General;  Laterality: N/A;  Porta-cath removal  left  . PORTACATH PLACEMENT  03/27/2011   Procedure: INSERTION PORT-A-CATH;  Surgeon: Adin Hector, MD;  Location: [facility];  Service: General;  Laterality: Left;  insert port a cath    There were no vitals filed for this visit.      Subjective Assessment - 08/20/16 1432    Subjective Pt reports that she had to do some laundry today at her client's house. This bothered her knees alot. Currently she has no pain when sitting, but if she stands up, her Rt knee increases to 3 or 4 out of 10.   Patient Stated Goals walk straight, without hobbling or limping   Currently in Pain? No/denies   Pain Onset More than a month ago                         [facility] Adult PT Treatment/Exercise - 08/20/16 0001      Knee/Hip Exercises: Stretches   Sports administrator 2 reps;30 Scientist, research (physical sciences) Limitations  therapist over pressure (Prone)     Knee/Hip Exercises: Supine   Bridges with Diona Foley Squeeze Both;2 sets;15 reps;Other (comment)   Other Supine Knee/Hip Exercises supine clamshells with blue TB 2x15 reps each      Knee/Hip Exercises: Prone   Hamstring Curl 2 sets;10 reps   Hamstring Curl Limitations double red TB      Manual Therapy   Manual Therapy Soft tissue mobilization   Manual therapy comments Manual complete separate rest of tx   Soft tissue mobilization STM and IASTM Rt distal quadriceps                 PT Education - 08/20/16 1537    Education provided Yes   Education Details implications for manual techniques and importance of following up with stretches provided as HEP; benefits of aquatic exercise and encouraged pt to seek this out at the local gym    Person(s) Educated Patient   Methods Explanation    Comprehension Verbalized understanding          PT Short Term Goals - 08/04/16 1557      PT SHORT TERM GOAL #1   Title Pt will be independent and consistently perform HEP to promote return to PLOF and decrease risk of reinjury.    Time 2   Period Weeks   Status New     PT SHORT TERM GOAL #2   Title Pt will have improved R knee AROM to WNL/to be symmetrical with L knee in order to decrease pain and maximize stair ambulation.   Time 2   Period Weeks   Status New     PT SHORT TERM GOAL #3   Title Pt will have improved 5xSTS to 10 seconds or less to demonstrate improved overall BLE strength and maximize return to PLOF.   Time 2   Period Weeks   Status New     PT SHORT TERM GOAL #4   Title Pt will have improved SLS to at least 30 sec with no UE support to maximize gait and demonstrate improved overall hip stability and core strength.   Time 2   Period Weeks   Status New           PT Long Term Goals - 08/04/16 1604      PT LONG TERM GOAL #1   Title Pt will have improved overall BLE strength to 5/5 in order to maximize gait, balance, and function at work.   Time 4   Period Weeks   Status New     PT LONG TERM GOAL #2   Title Pt will have improved 3MWT by at least 183f and with average walking speed of 1.053m or > to maximize function in the community and demonstrate improved overall strength, endurance, and decreased pain.   Time 4   Period Weeks   Status New     PT LONG TERM GOAL #3   Title Pt will report being able to cook and clean dishes for 45 mins or > to demonstrate an improved tolerance to standing to maximzie function at home.   Time 4   Period Weeks   Status New               Plan - 08/20/16 1538    Clinical Impression Statement Today's session continued with therex to improve hip/knee strength, however the exercises were adjusted to a more unweighted position to avoid placing heavy stresses on the knees following a busy day at work. Pt was  able  to complete exercises with intermittent rest breaks, and therapist ended with manual techniques to address soft tissue tightness and spasm in the Rt quad. Encouraged continued HEP adherence at this time.    Rehab Potential Good   PT Frequency 2x / week   PT Duration 4 weeks   PT Treatment/Interventions ADLs/Self Care Home Management;Electrical Stimulation;Cryotherapy;Gait training;Stair training;Functional mobility training;Therapeutic activities;Therapeutic exercise;Balance training;Neuromuscular re-education;Patient/family education;Manual techniques;Passive range of motion;Dry needling;Taping   PT Next Visit Plan address muscle spasm in Rt quad; quad flexibility; continue to progress hip/knee strengthening   PT Home Exercise Plan eval: heel slides for ROM, HS stretch, prone quad stretch, quad sets   Consulted and Agree with Plan of Care Patient      Patient will benefit from skilled therapeutic intervention in order to improve the following deficits and impairments:  Abnormal gait, Decreased activity tolerance, Decreased balance, Decreased endurance, Decreased range of motion, Decreased strength, Difficulty walking, Impaired flexibility, Improper body mechanics, Postural dysfunction, Pain, Obesity  Visit Diagnosis: Chronic pain of right knee  Muscle weakness (generalized)  Difficulty in walking, not elsewhere classified  Other symptoms and signs involving the musculoskeletal system     Problem List Patient Active Problem List   Diagnosis Date Noted  . Breast cancer, left breast ([place]) 08/16/2015  . Sciatica of right side associated with disorder of lumbar spine 08/27/2014  . Allergic rhinitis 07/03/2014  . Vertigo, intermittent 05/20/2014  . Nonspecific elevation of levels of transaminase or lactic acid dehydrogenase (LDH) 05/09/2014  . Rotator cuff syndrome 09/19/2013  . Metabolic syndrome X 45/62/5638  . Anemia 02/18/2012  . Obesity, morbid, BMI 40.0-49.9 ([place]) 02/18/2012  .  OA (osteoarthritis) of knee 12/29/2011  . Chronic pain of right knee 12/14/2011  . BRCA1 positive 07/01/2011  . Invasive ductal carcinoma of right breast, stage 1 02/18/2011  . Prediabetes 01/22/2011  . Headache 01/01/2010  . Thyroid nodule 05/17/2009  . Dyslipidemia 02/22/2007    3:45 PM,08/20/16 Elly Modena PT, DPT [facility] [address] [place], [place], [address] Phone: [phone]   Fax:  [phone]  Name: Kathleen Reynolds MRN: [id number] Date of Birth: 27-Jul-1960

## 2016-08-25 ENCOUNTER — Ambulatory Visit (HOSPITAL_COMMUNITY): Payer: BLUE CROSS/BLUE SHIELD

## 2016-08-25 ENCOUNTER — Encounter (HOSPITAL_COMMUNITY): Payer: Self-pay

## 2016-08-25 DIAGNOSIS — G8929 Other chronic pain: Secondary | ICD-10-CM

## 2016-08-25 DIAGNOSIS — M6281 Muscle weakness (generalized): Secondary | ICD-10-CM

## 2016-08-25 DIAGNOSIS — R262 Difficulty in walking, not elsewhere classified: Secondary | ICD-10-CM

## 2016-08-25 DIAGNOSIS — R29898 Other symptoms and signs involving the musculoskeletal system: Secondary | ICD-10-CM

## 2016-08-25 DIAGNOSIS — M25561 Pain in right knee: Principal | ICD-10-CM

## 2016-08-25 NOTE — Therapy (Signed)
[place], [place], [address] Phone: [phone]   Fax:  [phone]  Physical Therapy Treatment  Patient Details  Name: Kathleen Reynolds MRN: [id number] Date of Birth: 11/12/1960 Referring Provider: Arther Abbott, MD  Encounter Date: 08/25/2016      PT End of Session - 08/25/16 1436    Visit Number 7   Number of Visits 9   Date for PT Re-Evaluation 09/01/16   Authorization Type BCBS Other   Authorization Time Period 08/04/16 to 09/01/16   PT Start Time 1433   PT Stop Time 1515   PT Time Calculation (min) 42 min   Activity Tolerance Patient tolerated treatment well;No increased pain   Behavior During Therapy WFL for tasks assessed/performed      Past Medical History:  Diagnosis Date  . Anemia LIFELONG   HEAVY MENSES MAR 2012 HB 13 MCV 86.7 FERRITIN  66  . Arthritis   . Breast cancer ([place])    Right  . Breast mass in female    right  . 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 Hyperlipidemia   . Left rotator cuff tear   . Migraines   . Morbid obesity ([place])   . RUPTURE ROTATOR CUFF 06/08/2007   Qualifier: Diagnosis of  By: Aline Brochure MD, Dorothyann Peng    . Urticaria     Past Surgical History:  Procedure Laterality Date  . BREAST BIOPSY  02/13/2011   Procedure: BREAST BIOPSY WITH NEEDLE LOCALIZATION;  Surgeon: Adin Hector, MD;  Location: [facility];  Service: General;  Laterality: Right;  . BREAST SURGERY  11/1997   Left breast cancer-mastectomy  . BREAST SURGERY  2/13-   rt axilly bx  . COLONOSCOPY  2004 DR. SMITH   No polyp, hemorrhoids  . COLONOSCOPY  06/09/10   [id number] adenomas   . COLONOSCOPY N/A 08/19/2015   Procedure: COLONOSCOPY;  Surgeon: Danie Binder, MD;  [facility];  Service: Endoscopy;  Laterality: N/A;  130 - moved to 12:45 - Ginger notified pt  . DILATION AND CURETTAGE OF UTERUS    . DILATION AND CURETTAGE OF UTERUS  05/2002  . hystersonogram  2009  . MASTECTOMY   1999   left breast cancer   . PORT-A-CATH REMOVAL  10/20/2011   Procedure: MINOR REMOVAL PORT-A-CATH;  Surgeon: Adin Hector, MD;  Location: [facility];  Service: General;  Laterality: N/A;  Porta-cath removal  left  . PORTACATH PLACEMENT  03/27/2011   Procedure: INSERTION PORT-A-CATH;  Surgeon: Adin Hector, MD;  Location: [facility];  Service: General;  Laterality: Left;  insert port a cath    There were no vitals filed for this visit.      Subjective Assessment - 08/25/16 1434    Subjective Pt states that she is dragging today because she didn't sleep good last night. She states that she has no knee pain today and she feels like she is getting better overall.   Patient Stated Goals walk straight, without hobbling or limping   Currently in Pain? No/denies   Pain Onset More than a month ago             [facility] Adult PT Treatment/Exercise - 08/25/16 0001      Knee/Hip Exercises: Stretches   Sports administrator 2 reps;30 Scientist, research (physical sciences) Limitations prone with rope     Knee/Hip Exercises: Standing   Functional Squat 2 sets;10 reps   Functional Squat Limitations  1 set no weight, 2nd set goblet squats with 10#   Other Standing Knee Exercises knee drives with RNT with purple band 10reps x10 sec BLE     Knee/Hip Exercises: Supine   Bridges Limitations straight leg bridge with 6" step and foam 2 x 10 reps   Single Leg Bridge Both;2 sets;5 reps     Manual Therapy   Manual Therapy Soft tissue mobilization   Manual therapy comments Manual complete separate rest of tx   Soft tissue mobilization STM and IASTM Rt distal quadriceps               PT Education - 08/25/16 1518    Education provided Yes   Education Details updated HEP, exercise technique, perform self-manual techniques at home   Person(s) Educated Patient   Methods Explanation;Demonstration;Handout   Comprehension Verbalized understanding;Returned demonstration           PT Short Term Goals - 08/04/16 1557      PT SHORT TERM GOAL #1   Title Pt will be independent and consistently perform HEP to promote return to PLOF and decrease risk of reinjury.    Time 2   Period Weeks   Status New     PT SHORT TERM GOAL #2   Title Pt will have improved R knee AROM to WNL/to be symmetrical with L knee in order to decrease pain and maximize stair ambulation.   Time 2   Period Weeks   Status New     PT SHORT TERM GOAL #3   Title Pt will have improved 5xSTS to 10 seconds or less to demonstrate improved overall BLE strength and maximize return to PLOF.   Time 2   Period Weeks   Status New     PT SHORT TERM GOAL #4   Title Pt will have improved SLS to at least 30 sec with no UE support to maximize gait and demonstrate improved overall hip stability and core strength.   Time 2   Period Weeks   Status New           PT Long Term Goals - 08/04/16 1604      PT LONG TERM GOAL #1   Title Pt will have improved overall BLE strength to 5/5 in order to maximize gait, balance, and function at work.   Time 4   Period Weeks   Status New     PT LONG TERM GOAL #2   Title Pt will have improved 3MWT by at least 173f and with average walking speed of 1.035m or > to maximize function in the community and demonstrate improved overall strength, endurance, and decreased pain.   Time 4   Period Weeks   Status New     PT LONG TERM GOAL #3   Title Pt will report being able to cook and clean dishes for 45 mins or > to demonstrate an improved tolerance to standing to maximzie function at home.   Time 4   Period Weeks   Status New               Plan - 08/25/16 1521    Clinical Impression Statement Began session with manual to R quad; pt continues with soft tissue restrictions throughout distal portion. Progressed pt's glute strengthening this date, with pt requiring only minimal cueing for proper technique to decrease stress on knees. Pt overall making progress  towards goals and her knee AROM is symmetrical to her LLE at 109deg. Continue POC as planned.  Rehab Potential Good   PT Frequency 2x / week   PT Duration 4 weeks   PT Treatment/Interventions ADLs/Self Care Home Management;Electrical Stimulation;Cryotherapy;Gait training;Stair training;Functional mobility training;Therapeutic activities;Therapeutic exercise;Balance training;Neuromuscular re-education;Patient/family education;Manual techniques;Passive range of motion;Dry needling;Taping   PT Next Visit Plan continue address muscle spasm in Rt quad and follow up on if pt performed on self at home; quad flexibility; continue to progress hip/knee strengthening and dynamic stability of hips/preventing knee valgus; add side stepping with TB and marching on foam with contralateral hold for 3-5"   PT Home Exercise Plan eval: HS stretch, prone quad stretch   Consulted and Agree with Plan of Care Patient      Patient will benefit from skilled therapeutic intervention in order to improve the following deficits and impairments:  Abnormal gait, Decreased activity tolerance, Decreased balance, Decreased endurance, Decreased range of motion, Decreased strength, Difficulty walking, Impaired flexibility, Improper body mechanics, Postural dysfunction, Pain, Obesity  Visit Diagnosis: Chronic pain of right knee  Muscle weakness (generalized)  Difficulty in walking, not elsewhere classified  Other symptoms and signs involving the musculoskeletal system     Problem List Patient Active Problem List   Diagnosis Date Noted  . Breast cancer, left breast ([place]) 08/16/2015  . Sciatica of right side associated with disorder of lumbar spine 08/27/2014  . Allergic rhinitis 07/03/2014  . Vertigo, intermittent 05/20/2014  . Nonspecific elevation of levels of transaminase or lactic acid dehydrogenase (LDH) 05/09/2014  . Rotator cuff syndrome 09/19/2013  . Metabolic syndrome X 03/04/4386  . Anemia 02/18/2012  .  Obesity, morbid, BMI 40.0-49.9 ([place]) 02/18/2012  . OA (osteoarthritis) of knee 12/29/2011  . Chronic pain of right knee 12/14/2011  . BRCA1 positive 07/01/2011  . Invasive ductal carcinoma of right breast, stage 1 02/18/2011  . Prediabetes 01/22/2011  . Headache 01/01/2010  . Thyroid nodule 05/17/2009  . Dyslipidemia 02/22/2007     Geraldine Solar PT, DPT  [facility] [address] [place], [place], [address] Phone: [phone]   Fax:  [phone]  Name: Kathleen Reynolds MRN: [id number] Date of Birth: 30-Aug-1960

## 2016-08-25 NOTE — Patient Instructions (Signed)
  Mini Squats  stand with your feet hip-width apart. Keep chair behind you for form.  Slowly bend your knees as far as is comfortable, keeping them facing forwards. Keep your back straight at all times.   Gently come up to standing, squeezing your buttocks as you do so.  Repeat.   Step Ups  Using the bottom step of your stairs, step up with your affected leg followed by your unaffected leg. Step back with your affected leg followed by your unaffected leg.   Side Step Ups  Place affected extremity onto step. Step up and touch step with other foot.   Perform these 1x/day, 2-3 sets of 10 each.

## 2016-08-27 ENCOUNTER — Telehealth (HOSPITAL_COMMUNITY): Payer: Self-pay

## 2016-08-27 ENCOUNTER — Ambulatory Visit (HOSPITAL_COMMUNITY): Payer: BLUE CROSS/BLUE SHIELD

## 2016-08-27 ENCOUNTER — Encounter (HOSPITAL_COMMUNITY): Payer: Self-pay

## 2016-08-27 DIAGNOSIS — M6281 Muscle weakness (generalized): Secondary | ICD-10-CM

## 2016-08-27 DIAGNOSIS — M25561 Pain in right knee: Secondary | ICD-10-CM | POA: Diagnosis not present

## 2016-08-27 DIAGNOSIS — G8929 Other chronic pain: Secondary | ICD-10-CM

## 2016-08-27 DIAGNOSIS — R262 Difficulty in walking, not elsewhere classified: Secondary | ICD-10-CM

## 2016-08-27 DIAGNOSIS — R29898 Other symptoms and signs involving the musculoskeletal system: Secondary | ICD-10-CM

## 2016-08-27 NOTE — Therapy (Signed)
[place], [place], [address] Phone: [phone]   Fax:  [phone]  Physical Therapy Treatment  Patient Details  Name: Kathleen Reynolds MRN: [id number] Date of Birth: 02/20/1960 Referring Provider: Arther Abbott, MD  Encounter Date: 08/27/2016      PT End of Session - 08/27/16 1439    Visit Number 8   Number of Visits 9   Date for PT Re-Evaluation 09/01/16   Authorization Type BCBS Other   Authorization Time Period 08/04/16 to 09/01/16   PT Start Time 1440   PT Stop Time 1523   PT Time Calculation (min) 43 min   Activity Tolerance Patient tolerated treatment well;No increased pain   Behavior During Therapy WFL for tasks assessed/performed      Past Medical History:  Diagnosis Date  . Anemia LIFELONG   HEAVY MENSES MAR 2012 HB 13 MCV 86.7 FERRITIN  66  . Arthritis   . Breast cancer ([place])    Right  . Breast mass in female    right  . 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 Hyperlipidemia   . Left rotator cuff tear   . Migraines   . Morbid obesity ([facility])   . RUPTURE ROTATOR CUFF 06/08/2007   Qualifier: Diagnosis of  By: Aline Brochure MD, Dorothyann Peng    . Urticaria     Past Surgical History:  Procedure Laterality Date  . BREAST BIOPSY  02/13/2011   Procedure: BREAST BIOPSY WITH NEEDLE LOCALIZATION;  Surgeon: Adin Hector, MD;  Location: [facility];  Service: General;  Laterality: Right;  . BREAST SURGERY  11/1997   Left breast cancer-mastectomy  . BREAST SURGERY  2/13-   rt axilly bx  . COLONOSCOPY  2004 DR. SMITH   No polyp, hemorrhoids  . COLONOSCOPY  06/09/10   [id number] adenomas   . COLONOSCOPY N/A 08/19/2015   Procedure: COLONOSCOPY;  Surgeon: Danie Binder, MD;  [facility];  Service: Endoscopy;  Laterality: N/A;  130 - moved to 12:45 - Ginger notified pt  . DILATION AND CURETTAGE OF UTERUS    . DILATION AND CURETTAGE OF UTERUS  05/2002  . hystersonogram  2009  . MASTECTOMY   1999   left breast cancer   . PORT-A-CATH REMOVAL  10/20/2011   Procedure: MINOR REMOVAL PORT-A-CATH;  Surgeon: Adin Hector, MD;  Location: [facility];  Service: General;  Laterality: N/A;  Porta-cath removal  left  . PORTACATH PLACEMENT  03/27/2011   Procedure: INSERTION PORT-A-CATH;  Surgeon: Adin Hector, MD;  Location: [facility];  Service: General;  Laterality: Left;  insert port a cath    There were no vitals filed for this visit.      Subjective Assessment - 08/27/16 1440    Subjective Pt states that she feels okay today. Her knee is a little sore today after doing her HEP last night.    Patient Stated Goals walk straight, without hobbling or limping   Currently in Pain? Yes   Pain Score 2    Pain Location Knee   Pain Orientation Right   Pain Descriptors / Indicators Aching   Pain Type Chronic pain   Pain Onset More than a month ago   Pain Frequency Constant   Aggravating Factors  standing, steps, incline slope   Pain Relieving Factors sitting, ice and pain meds   Effect of Pain on Daily Activities increase  [facility] Adult PT Treatment/Exercise - 08/27/16 0001      Knee/Hip Exercises: Standing   Forward Step Up Both;10 reps;Hand Hold: 0;Step Height: 4"   Forward Step Up Limitations + foam on top   SLS on foam 5 x 10" each   Other Standing Knee Exercises SLS on firm with palov press with RTB 2x10; bil side stepping 30f x 2RT with RTB   Other Standing Knee Exercises star drill on foam x 5 reps each; march on foam with contralateral holds for 3 sec x 10 each     Manual Therapy   Manual Therapy Soft tissue mobilization   Manual therapy comments Manual complete separate rest of tx   Soft tissue mobilization STM to distal quad, especially VLO; IASTM with the stick to ITB               PT Education - 08/27/16 1538    Education provided Yes   Education Details added sidestepping with RTB and SLS on pillow to HEP,  will reassess next session   Person(s) Educated Patient   Methods Explanation;Demonstration   Comprehension Verbalized understanding;Returned demonstration          PT Short Term Goals - 08/04/16 1557      PT SHORT TERM GOAL #1   Title Pt will be independent and consistently perform HEP to promote return to PLOF and decrease risk of reinjury.    Time 2   Period Weeks   Status New     PT SHORT TERM GOAL #2   Title Pt will have improved R knee AROM to WNL/to be symmetrical with L knee in order to decrease pain and maximize stair ambulation.   Time 2   Period Weeks   Status New     PT SHORT TERM GOAL #3   Title Pt will have improved 5xSTS to 10 seconds or less to demonstrate improved overall BLE strength and maximize return to PLOF.   Time 2   Period Weeks   Status New     PT SHORT TERM GOAL #4   Title Pt will have improved SLS to at least 30 sec with no UE support to maximize gait and demonstrate improved overall hip stability and core strength.   Time 2   Period Weeks   Status New           PT Long Term Goals - 08/04/16 1604      PT LONG TERM GOAL #1   Title Pt will have improved overall BLE strength to 5/5 in order to maximize gait, balance, and function at work.   Time 4   Period Weeks   Status New     PT LONG TERM GOAL #2   Title Pt will have improved 3MWT by at least 1097fand with average walking speed of 1.69m569mor > to maximize function in the community and demonstrate improved overall strength, endurance, and decreased pain.   Time 4   Period Weeks   Status New     PT LONG TERM GOAL #3   Title Pt will report being able to cook and clean dishes for 45 mins or > to demonstrate an improved tolerance to standing to maximzie function at home.   Time 4   Period Weeks   Status New               Plan - 08/27/16 1539    Clinical Impression Statement Began session with manual again this date. Pt still has palpable  knot on distal VLO which was  addressed with STM; also performed IASTM with the stick to pt's ITB as she had increased tightness and tenderness to palpation throughout its distal portion. Rest of session focused on strengthening and dynamic stabiilzation of hips and knees. Pt due for reassessment next session.   Rehab Potential Good   PT Frequency 2x / week   PT Duration 4 weeks   PT Treatment/Interventions ADLs/Self Care Home Management;Electrical Stimulation;Cryotherapy;Gait training;Stair training;Functional mobility training;Therapeutic activities;Therapeutic exercise;Balance training;Neuromuscular re-education;Patient/family education;Manual techniques;Passive range of motion;Dry needling;Taping   PT Next Visit Plan reassess   PT Home Exercise Plan eval: HS stretch, prone quad stretch; 7/24: fwd/lat step ups, mini squats; 7/26: sidestepping with RTB   Consulted and Agree with Plan of Care Patient      Patient will benefit from skilled therapeutic intervention in order to improve the following deficits and impairments:  Abnormal gait, Decreased activity tolerance, Decreased balance, Decreased endurance, Decreased range of motion, Decreased strength, Difficulty walking, Impaired flexibility, Improper body mechanics, Postural dysfunction, Pain, Obesity  Visit Diagnosis: Chronic pain of right knee  Muscle weakness (generalized)  Difficulty in walking, not elsewhere classified  Other symptoms and signs involving the musculoskeletal system     Problem List Patient Active Problem List   Diagnosis Date Noted  . Breast cancer, left breast ([facility]) 08/16/2015  . Sciatica of right side associated with disorder of lumbar spine 08/27/2014  . Allergic rhinitis 07/03/2014  . Vertigo, intermittent 05/20/2014  . Nonspecific elevation of levels of transaminase or lactic acid dehydrogenase (LDH) 05/09/2014  . Rotator cuff syndrome 09/19/2013  . Metabolic syndrome X 79/81/0254  . Anemia 02/18/2012  . Obesity, morbid, BMI  40.0-49.9 ([place]) 02/18/2012  . OA (osteoarthritis) of knee 12/29/2011  . Chronic pain of right knee 12/14/2011  . BRCA1 positive 07/01/2011  . Invasive ductal carcinoma of right breast, stage 1 02/18/2011  . Prediabetes 01/22/2011  . Headache 01/01/2010  . Thyroid nodule 05/17/2009  . Dyslipidemia 02/22/2007     Geraldine Solar PT, DPT  [facility] [address] [place], [place], [address] Phone: [phone]   Fax:  [phone]  Name: Ronesha Heenan MRN: [id number] Date of Birth: [birth year]

## 2016-08-27 NOTE — Telephone Encounter (Signed)
Left voicemail offering pt earlier appointment times. Told her to call back if she could make it, but otherwise I would see her at her scheduled appointment time.  Geraldine Solar PT, DPT

## 2016-08-31 ENCOUNTER — Telehealth (HOSPITAL_COMMUNITY): Payer: Self-pay | Admitting: Physical Therapy

## 2016-08-31 ENCOUNTER — Ambulatory Visit
Admission: RE | Admit: 2016-08-31 | Discharge: 2016-08-31 | Disposition: A | Discharge status: Home / Self Care | Payer: BLUE CROSS/BLUE SHIELD | Source: Ambulatory Visit | Attending: Oncology | Admitting: Oncology

## 2016-08-31 ENCOUNTER — Other Ambulatory Visit (HOSPITAL_COMMUNITY): Payer: Self-pay | Admitting: Oncology

## 2016-08-31 ENCOUNTER — Ambulatory Visit
Admission: RE | Admit: 2016-08-31 | Discharge: 2016-08-31 | Disposition: A | Discharge status: Home / Self Care | Payer: BLUE CROSS/BLUE SHIELD | Source: Ambulatory Visit | Attending: Hematology & Oncology | Admitting: Hematology & Oncology

## 2016-08-31 DIAGNOSIS — C50911 Malignant neoplasm of unspecified site of right female breast: Secondary | ICD-10-CM

## 2016-08-31 DIAGNOSIS — N6320 Unspecified lump in the left breast, unspecified quadrant: Secondary | ICD-10-CM

## 2016-08-31 DIAGNOSIS — C50912 Malignant neoplasm of unspecified site of left female breast: Secondary | ICD-10-CM

## 2016-08-31 HISTORY — DX: Personal history of irradiation: Z92.3

## 2016-08-31 HISTORY — DX: Personal history of antineoplastic chemotherapy: Z92.21

## 2016-08-31 NOTE — Telephone Encounter (Signed)
Pt  requested to come in earlier due to getting off work at [place].  NF 08/31/16

## 2016-09-01 ENCOUNTER — Ambulatory Visit (HOSPITAL_COMMUNITY): Payer: BLUE CROSS/BLUE SHIELD

## 2016-09-01 ENCOUNTER — Encounter (HOSPITAL_COMMUNITY): Payer: Self-pay

## 2016-09-01 ENCOUNTER — Ambulatory Visit: Payer: Self-pay | Admitting: Nutrition

## 2016-09-01 DIAGNOSIS — G8929 Other chronic pain: Secondary | ICD-10-CM

## 2016-09-01 DIAGNOSIS — M6281 Muscle weakness (generalized): Secondary | ICD-10-CM

## 2016-09-01 DIAGNOSIS — R262 Difficulty in walking, not elsewhere classified: Secondary | ICD-10-CM

## 2016-09-01 DIAGNOSIS — M25561 Pain in right knee: Principal | ICD-10-CM

## 2016-09-01 DIAGNOSIS — R29898 Other symptoms and signs involving the musculoskeletal system: Secondary | ICD-10-CM

## 2016-09-01 NOTE — Patient Instructions (Signed)
  STEP DOWN - FORWARD  Start with both feet on top of a step/box. Next, slowly lower the unaffected leg down foward off the step/box to lightly touch the heel to the floor. Then return to the original position with both feet on the step/box.   Maintain proper knee alignment: Knee in line with the 2nd toe and not passing in front of the toes.   Perform 1x/day, 2-3 sets of 10 making sure to keep your knee over your toes (do not let it cave in)   Use band around knees to increase hip engagement   HIP ABDUCTION - SIDELYING  While lying on your side, slowly raise up your top leg to the side. Keep your knee straight and maintain your toes pointed forward the entire time. Keep your leg in-line with your body.  The bottom leg can be bent to stabilize your body.  Perform 1x/day, 2-3 sets of 10 reps each   ELASTIC BAND - SIDELYING CLAM-   While lying on your side with your knees bent and an elastic band wrapped around your knees, draw up the top knee while keeping contact of your feet together as shown.   Do not let your pelvis roll back during the lifting movement.   Perform 1x/day, 2-3 sets of 10 reps   WALKING  Start a walking program.  Start out by walking for 15-20 mins/day. If you feel fine, increase it by 5-10 mins the next week.

## 2016-09-01 NOTE — Therapy (Signed)
[facility] [address] [place], [place], [address] Phone: [phone]   Fax:  [phone]  Physical Therapy Treatment/Reassessment  Patient Details  Name: Kathleen Reynolds MRN: [id number] Date of Birth: 15-Oct-1960 Referring Provider: Arther Abbott, MD  Encounter Date: 09/01/2016      PT End of Session - 09/01/16 1526    Visit Number 9   Number of Visits 10   Date for PT Re-Evaluation 09/29/16   Authorization Type [employer] Other   Authorization Time Period 09/01/16 09/29/16   PT Start Time 1436   PT Stop Time 1525   PT Time Calculation (min) 49 min   Activity Tolerance Patient tolerated treatment well;No increased pain   Behavior During Therapy WFL for tasks assessed/performed      Past Medical History:  Diagnosis Date  . Anemia LIFELONG   HEAVY MENSES MAR 2012 HB 13 MCV 86.7 FERRITIN  66  . Arthritis   . Breast cancer ([facility] [place])    Right  . Breast mass in female    right  . 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 Hyperlipidemia   . Left rotator cuff tear   . Migraines   . Morbid obesity ([facility])   . Personal history of chemotherapy   . Personal history of radiation therapy   . RUPTURE ROTATOR CUFF 06/08/2007   Qualifier: Diagnosis of  By: Aline Brochure MD, Dorothyann Peng    . Urticaria     Past Surgical History:  Procedure Laterality Date  . BREAST BIOPSY  02/13/2011   Procedure: BREAST BIOPSY WITH NEEDLE LOCALIZATION;  Surgeon: Adin Hector, MD;  Location: [facility];  Service: General;  Laterality: Right;  . BREAST LUMPECTOMY Right 2013  . BREAST SURGERY  11/1997   Left breast cancer-mastectomy  . BREAST SURGERY  2/13-   rt axilly bx  . COLONOSCOPY  2004 DR. SMITH   No polyp, hemorrhoids  . COLONOSCOPY  06/09/10   PQZ:RAQTMA adenomas   . COLONOSCOPY N/A 08/19/2015   Procedure: COLONOSCOPY;  Surgeon: Danie Binder, MD;  [facility];  Service: Endoscopy;  Laterality: N/A;  130 - moved to 12:45 - Ginger notified pt   . DILATION AND CURETTAGE OF UTERUS    . DILATION AND CURETTAGE OF UTERUS  05/2002  . hystersonogram  2009  . MASTECTOMY  1999   left breast cancer   . PORT-A-CATH REMOVAL  10/20/2011   Procedure: MINOR REMOVAL PORT-A-CATH;  Surgeon: Adin Hector, MD;  Location: [facility];  Service: General;  Laterality: N/A;  Porta-cath removal  left  . PORTACATH PLACEMENT  03/27/2011   Procedure: INSERTION PORT-A-CATH;  Surgeon: Adin Hector, MD;  Location: [facility];  Service: General;  Laterality: Left;  insert port a cath    There were no vitals filed for this visit.      Subjective Assessment - 09/01/16 1437    Subjective Pt states that she danced on Saturday for about 2 songs and stated that she felt pain in both of her knees, right in front. She described it as weak and wobbly. She said that she is still sore today.   How long can you sit comfortably? no issues   How long can you stand comfortably? 45 mins or >   How long can you walk comfortably? hasn't tried walking since   Patient Stated Goals walk straight, without hobbling or limping   Currently in Pain? No/denies   Pain Onset More than a month  ago               [facility] PT Assessment - 09/01/16 0001      Strength   Right Hip Flexion 4+/5  was 4+   Right Hip Extension 4/5  was 3+   Right Hip ABduction 4/5  was 3+   Left Hip Extension 4+/5  was 4   Right Knee Flexion 4+/5  was 4+, non-painful   Right Knee Extension 4+/5  was 4, non-painful     Ambulation/Gait   Ambulation/Gait Yes   Ambulation Distance (Feet) 628 Feet  3MWT   Gait velocity 1.50ms     Balance   Balance Assessed Yes     Static Standing Balance   Static Standing - Balance Support No upper extremity supported   Static Standing Balance -  Activities  Single Leg Stance - Right Leg;Single Leg Stance - Left Leg     Standardized Balance Assessment   Standardized Balance Assessment Five Times Sit to Stand   Five times  sit to stand comments  13.5 sec from chair, no UE                PT Education - 09/01/16 1525    Education provided Yes   Education Details updated HEP, proper step down form, schedule 1 appointment in 4 weeks to see how she did on HEP and then anticipate d/c at that time   Person(s) Educated Patient   Methods Explanation;Demonstration;Handout   Comprehension Verbalized understanding;Returned demonstration          PT Short Term Goals - 09/01/16 1443      PT SHORT TERM GOAL #1   Title Pt will be independent and consistently perform HEP to promote return to PLOF and decrease risk of reinjury.    Time 2   Period Weeks   Status Achieved     PT SHORT TERM GOAL #2   Title Pt will have improved R knee AROM to WNL/to be symmetrical with L knee in order to decrease pain and maximize stair ambulation.   Baseline 7/31: pt's AROM symmetrical to L knee as measured during previous treatment session   Time 2   Period Weeks   Status Achieved     PT SHORT TERM GOAL #3   Title Pt will have improved 5xSTS to 10 seconds or less to demonstrate improved overall BLE strength and maximize return to PLOF.   Baseline 7/31: 14.7 and 13.5 sec this date   Time 2   Period Weeks   Status On-going     PT SHORT TERM GOAL #4   Title Pt will have improved SLS to at least 30 sec with no UE support to maximize gait and demonstrate improved overall hip stability and core strength.   Baseline 7/31: BLE 30 sec or >   Time 2   Period Weeks   Status Achieved           PT Long Term Goals - 09/01/16 1443      PT LONG TERM GOAL #1   Title Pt will have improved overall BLE strength to 5/5 in order to maximize gait, balance, and function at work.   Baseline 7/31: pt still at 4 to 4+/5 throughout RLE, but has improved in almost all muscle groups tested   Time 4   Period Weeks   Status On-going     PT LONG TERM GOAL #2   Title Pt will have improved 3MWT by at least 1027fand with  average walking  speed of 1.64ms or > to maximize function in the community and demonstrate improved overall strength, endurance, and decreased pain.   Baseline 7/31: 6269fwith 1.0674mwalking speed   Time 4   Period Weeks   Status Achieved     PT LONG TERM GOAL #3   Title Pt will report being able to cook and clean dishes for 45 mins or > to demonstrate an improved tolerance to standing to maximzie function at home.   Baseline 7/31: able to stand for 45 mins or >   Time 4   Period Weeks   Status Achieved               Plan - 09/01/16 1527    Clinical Impression Statement PT reassessed pt's goals and outcome measures this date. Pt has made significant improvements since starting therapy as she has met 5/7 goals with the other 2 still on-going. Pt's MMT and functional strength still deficient as evidenced by the results of her MMT and 5xSTS time. Pt reports feeling 60-70% improved, stating that the remaining 30-40% is that she has difficulty going down stairs forwards, she has to go down backwards. PT assessed this today and pt noted to have increased knee valgus/hip IR when stepping down with the left (R foot on step). PT cued pt to keep hip ER/knee aligned over foot. Pt able to demonstrate understanding and reported no pain with this. Pt provided updated HEP to address hip and overall functional strengthening. At this time, pt will be placed on a HEP regimen and a walking program and will have 1 more follow up visit in 4 weeks and anticipate full d/c at that time.   Rehab Potential Good   PT Frequency 1x / week   PT Duration --  1 f/u visit on 09/29/16 after 4-week HEP regimen   PT Treatment/Interventions ADLs/Self Care Home Management;Electrical Stimulation;Cryotherapy;Gait training;Stair training;Functional mobility training;Therapeutic activities;Therapeutic exercise;Balance training;Neuromuscular re-education;Patient/family education;Manual techniques;Passive range of motion;Dry needling;Taping   PT  Next Visit Plan discharge on 09/29/16   PT Home Exercise Plan eval: HS stretch, prone quad stretch; 7/24: fwd/lat step ups, mini squats; 7/26: sidestepping with RTB; 7/31: mini squats with band, sit <> stands with band, step downs, sidelying hip abd, sidelying clamshells, walking program   Consulted and Agree with Plan of Care Patient      Patient will benefit from skilled therapeutic intervention in order to improve the following deficits and impairments:  Abnormal gait, Decreased activity tolerance, Decreased balance, Decreased endurance, Decreased range of motion, Decreased strength, Difficulty walking, Impaired flexibility, Improper body mechanics, Postural dysfunction, Pain, Obesity  Visit Diagnosis: Chronic pain of right knee - Plan: PT plan of care cert/re-cert  Muscle weakness (generalized) - Plan: PT plan of care cert/re-cert  Difficulty in walking, not elsewhere classified - Plan: PT plan of care cert/re-cert  Other symptoms and signs involving the musculoskeletal system - Plan: PT plan of care cert/re-cert     Problem List Patient Active Problem List   Diagnosis Date Noted  . Breast cancer, left breast ([facility]7/14/2017  . Sciatica of right side associated with disorder of lumbar spine 08/27/2014  . Allergic rhinitis 07/03/2014  . Vertigo, intermittent 05/20/2014  . Nonspecific elevation of levels of transaminase or lactic acid dehydrogenase (LDH) 05/09/2014  . Rotator cuff syndrome 09/19/2013  . Metabolic syndrome X 05/93/81/8299 Anemia 02/18/2012  . Obesity, morbid, BMI 40.0-49.9 ([facility]1/16/2014  . OA (osteoarthritis) of knee 12/29/2011  . Chronic  pain of right knee 12/14/2011  . BRCA1 positive 07/01/2011  . Invasive ductal carcinoma of right breast, stage 1 02/18/2011  . Prediabetes 01/22/2011  . Headache 01/01/2010  . Thyroid nodule 05/17/2009  . Dyslipidemia 02/22/2007     Geraldine Solar PT, DPT  [facility] [address] [place], [place], [address] Phone: [phone]   Fax:  [phone]  Name: Kathleen Reynolds MRN: [id number] Date of Birth: 1960-04-01

## 2016-09-16 ENCOUNTER — Telehealth: Payer: Self-pay | Admitting: Nutrition

## 2016-09-16 ENCOUNTER — Ambulatory Visit: Payer: Self-pay | Admitting: Nutrition

## 2016-09-16 NOTE — Telephone Encounter (Signed)
VM left to call and reschedule missed appt. 

## 2016-09-29 ENCOUNTER — Telehealth (HOSPITAL_COMMUNITY): Payer: Self-pay

## 2016-09-29 ENCOUNTER — Ambulatory Visit (HOSPITAL_COMMUNITY): Payer: BLUE CROSS/BLUE SHIELD

## 2016-09-29 ENCOUNTER — Encounter (HOSPITAL_COMMUNITY): Payer: Self-pay

## 2016-09-29 NOTE — Therapy (Signed)
[facility] [place], [place], [address] Phone: [phone]   Fax:  [phone]  Patient Details  Name: Kathleen Reynolds MRN: [id number] Date of Birth: 1960-06-17 Referring Provider:  No ref. provider found  Encounter Date: 09/29/2016    PHYSICAL THERAPY DISCHARGE SUMMARY  Visits from Start of Care: 9  Current functional level related to goals / functional outcomes: See last treatment note   Remaining deficits: Pt called and reported that she is doing well and wished to be d/c.   Education / Equipment: See last treatment note Plan: Patient agrees to discharge.  Patient goals were met. Patient is being discharged due to being pleased with the current functional level.  ?????       Geraldine Solar PT, [facility] [address] [place], [place], [address] Phone: [phone]   Fax:  [phone]

## 2016-09-29 NOTE — Telephone Encounter (Signed)
Pt reports she is doing well and requested to be D/C

## 2016-11-05 ENCOUNTER — Encounter (HOSPITAL_COMMUNITY): Payer: BLUE CROSS/BLUE SHIELD | Attending: Adult Health | Admitting: Adult Health

## 2016-11-05 VITALS — BP 127/65 | HR 83 | Temp 98.7°F | Resp 20 | Wt 291.3 lb

## 2016-11-05 DIAGNOSIS — Z853 Personal history of malignant neoplasm of breast: Secondary | ICD-10-CM

## 2016-11-05 DIAGNOSIS — Z23 Encounter for immunization: Secondary | ICD-10-CM

## 2016-11-05 DIAGNOSIS — N6310 Unspecified lump in the right breast, unspecified quadrant: Secondary | ICD-10-CM

## 2016-11-05 DIAGNOSIS — G629 Polyneuropathy, unspecified: Secondary | ICD-10-CM | POA: Diagnosis not present

## 2016-11-05 DIAGNOSIS — Z1509 Genetic susceptibility to other malignant neoplasm: Secondary | ICD-10-CM

## 2016-11-05 DIAGNOSIS — Z1501 Genetic susceptibility to malignant neoplasm of breast: Secondary | ICD-10-CM | POA: Diagnosis not present

## 2016-11-05 DIAGNOSIS — Z0000 Encounter for general adult medical examination without abnormal findings: Secondary | ICD-10-CM

## 2016-11-05 DIAGNOSIS — C50911 Malignant neoplasm of unspecified site of right female breast: Secondary | ICD-10-CM

## 2016-11-05 MED ORDER — INFLUENZA VAC SPLIT QUAD 0.5 ML IM SUSY
0.5000 mL | PREFILLED_SYRINGE | Freq: Once | INTRAMUSCULAR | Status: AC
  Administered 2016-11-05: 0.5 mL via INTRAMUSCULAR
  Filled 2016-11-05: qty 0.5

## 2016-11-05 NOTE — Progress Notes (Signed)
Pt given flu shot in right deltoid. Pt tolerated well. Pt stable and discharged home ambulatory.  

## 2016-11-05 NOTE — Patient Instructions (Signed)
[facility] at [facility] Discharge Instructions  RECOMMENDATIONS MADE BY THE CONSULTANT AND ANY TEST RESULTS WILL BE SENT TO YOUR REFERRING PHYSICIAN.  Today you saw Kathleen Reynolds  Thank you for choosing [facility] to provide your oncology and hematology care.  To afford each patient quality time with our provider, please arrive at least 15 minutes before your scheduled appointment time.    If you have a lab appointment with the [facility] please come in thru the  Main Entrance and check in at the main information desk  You need to re-schedule your appointment should you arrive 10 or more minutes late.  We strive to give you quality time with our providers, and arriving late affects you and other patients whose appointments are after yours.  Also, if you no show three or more times for appointments you may be dismissed from the clinic at the providers discretion.     Again, thank you for choosing [facility].  Our hope is that these requests will decrease the amount of time that you wait before being seen by our physicians.       _____________________________________________________________  Should you have questions after your visit to [facility], please contact our office at [phone] between the hours of 8:30 a.m. and 4:30 p.m.  Voicemails left after 4:30 p.m. will not be returned until the following business day.  For prescription refill requests, have your pharmacy contact our office.       Resources For Cancer Patients and their Caregivers ? American Cancer Society: Can assist with transportation, wigs, general needs, runs Look Good Feel Better.        438-253-7634 ? Cancer Care: Provides financial assistance, online support groups, medication/co-pay assistance.  1-800-813-HOPE (941)488-9782) ? [facility] Assists [place] cancer patients and their families through  emotional , educational and financial support.  (757)646-1876 ? [place] DSS Where to apply for food stamps, Medicaid and utility assistance. (562)449-5852 ? [facility]: Transportation to medical appointments. (320)745-3094 ? Social Security Administration: May apply for disability if have a Stage IV cancer. 308-461-9027 (413) 691-9734 ? [employer], Disability and Transit Services: Assists with nutrition, care and transit needs. [facility] Support Programs: @10RELATIVEDAYS @ > Cancer Support Group  2nd Tuesday of the month 1pm-2pm, Journey Room  > Creative Journey  3rd Tuesday of the month 1130am-1pm, Journey Room  > Look Good Feel Better  1st Wednesday of the month 10am-12 noon, Journey Room (Call [facility] to register [phone])

## 2016-11-05 NOTE — Progress Notes (Signed)
[name], [place] [address]   CLINIC:  Medical Oncology/Hematology  PCP:  Fayrene Helper, MD [address], [address] [place] [address] [phone]   REASON FOR VISIT:  Follow-up for Bilateral breast cancers; BRCA1+  CURRENT THERAPY: Surveillance     HISTORY OF PRESENT ILLNESS:  (From Dr. Laverle Patter last note on 05/05/16)      INTERVAL HISTORY:  [name] 56 y.o. female returns for routine follow-up for bilateral breast cancers; BRCA1+.   Overall, she tells me she has been feeling relatively well.  Appetite 100%; energy levels 50%. 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    Reports a new "knot beside my right nipple."  States that she noticed this lump about 1 month ago. Denies any nipple or skin changes.  She tells me she is due to see Dr. Dalbert Batman, her surgeon, at the end of this month.  She has not had hysterectomy or BSO; denies any vaginal bleeding, bloating, or abdominal pain.   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  She has recently completed PT for her right knee; she has bilateral knee pain which has been chronic.  She has chronic headaches, "since I was about 56 years old." Headaches are no worse.    Her PCP is Dr. Moshe Cipro and she sees her regularly.  Ms. Maclin would like to get her flu shot here today.      REVIEW OF SYSTEMS:  Review of Systems  Constitutional: Positive for fatigue. Negative for chills and fever.  HENT:   Positive for trouble swallowing (intermittent issues with swallowing solids; encouraged her to monitor her symptoms and report trends, if any, to her PCP. She also has GI physician she could call as well (Dr. Oneida Alar). ).   Eyes: Negative.   Respiratory: Negative.   Cardiovascular: Negative.   Gastrointestinal:  Negative.   Endocrine: Negative.   Genitourinary: Negative.  Negative for vaginal bleeding.   Musculoskeletal: Negative.   Skin: Negative.   Neurological: Positive for headaches and numbness.  Hematological: Negative.   Psychiatric/Behavioral: Negative.      PAST MEDICAL/SURGICAL HISTORY:  Past Medical History:  Diagnosis Date  . Anemia LIFELONG   HEAVY MENSES MAR 2012 HB 13 MCV 86.7 FERRITIN  66  . Arthritis   . Breast cancer ([facility])    Right  . Breast mass in female    right  . 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 Hyperlipidemia   . Left rotator cuff tear   . Migraines   . Morbid obesity ([facility])   . Personal history of chemotherapy   . Personal history of radiation therapy   . RUPTURE ROTATOR CUFF 06/08/2007   Qualifier: Diagnosis of  By: Aline Brochure MD, Dorothyann Peng    . Urticaria    Past Surgical History:  Procedure Laterality Date  . BREAST BIOPSY  02/13/2011   Procedure: BREAST BIOPSY WITH NEEDLE LOCALIZATION;  Surgeon: Adin Hector, MD;  Location: [facility];  Service: General;  Laterality: Right;  . BREAST LUMPECTOMY Right 2013  . BREAST SURGERY  11/1997   Left breast cancer-mastectomy  . BREAST SURGERY  2/13-   rt axilly bx  . COLONOSCOPY  2004 DR. SMITH   No polyp, hemorrhoids  . COLONOSCOPY  06/09/10   YDX:AJOINO  adenomas   . COLONOSCOPY N/A 08/19/2015   Procedure: COLONOSCOPY;  Surgeon: Danie Binder, MD;  [facility];  Service: Endoscopy;  Laterality: N/A;  130 - moved to 12:45 - Ginger notified pt  . DILATION AND CURETTAGE OF UTERUS    . DILATION AND CURETTAGE OF UTERUS  05/2002  . hystersonogram  2009  . MASTECTOMY  1999   left breast cancer   . PORT-A-CATH REMOVAL  10/20/2011   Procedure: MINOR REMOVAL PORT-A-CATH;  Surgeon: Adin Hector, MD;  Location: [facility];  Service: General;  Laterality: N/A;  Porta-cath removal  left  . PORTACATH PLACEMENT  03/27/2011   Procedure: INSERTION PORT-A-CATH;  Surgeon: Adin Hector,  MD;  Location: [facility];  Service: General;  Laterality: Left;  insert port a cath     SOCIAL HISTORY:  Social History   Social History  . Marital status: Single    Spouse name: N/A  . Number of children: N/A  . Years of education: N/A   Occupational History  . Not on file.   Social History Main Topics  . Smoking status: Never Smoker  . Smokeless tobacco: Never Used  . Alcohol use 1.0 oz/week    2 drink(s) per week     Comment: occasional  . Drug use: No  . Sexual activity: Yes    Birth control/ protection: None   Other Topics Concern  . Not on file   Social History Narrative   Single, 1 kid-56 yo female-lives with mom   Employed as a CNA   Rare Etoh.   No tobacco products    FAMILY HISTORY:  Family History  Problem Relation Age of Onset  . Hypertension Mother   . Cancer Father        lung cancer  . Diabetes Sister   . Cancer Brother        colon cancer  . Heart disease Sister        heart attack  . Asthma Sister   . Eczema Sister   . Allergic rhinitis Neg Hx   . Urticaria Neg Hx     CURRENT MEDICATIONS:  Outpatient Encounter Prescriptions as of 11/05/2016  Medication Sig  . acetaminophen-codeine (TYLENOL #3) 300-30 MG tablet Take 1 tablet by mouth at bedtime as needed for moderate pain.  . butalbital-acetaminophen-caffeine (FIORICET, ESGIC) 50-325-40 MG tablet Take 1 tablet by mouth 2 (two) times daily as needed. For migraine  . cyanocobalamin 100 MCG tablet Take 100 mcg by mouth daily.  Marland Kitchen EPINEPHrine 0.3 mg/0.3 mL IJ SOAJ injection Inject 0.3 mLs (0.3 mg total) into the muscle once.  . fluticasone (FLONASE) 50 MCG/ACT nasal spray Place 2 sprays into both nostrils daily.  . meclizine (ANTIVERT) 25 MG tablet Take 1 tablet (25 mg total) by mouth 3 (three) times daily as needed. For dizziness  . metFORMIN (GLUCOPHAGE) 500 MG tablet TAKE 1 TABLET (500 MG TOTAL) BY MOUTH 2 (TWO) TIMES DAILY WITH A MEAL.  . Multiple Vitamin (MULITIVITAMIN WITH  MINERALS) TABS Take 1 tablet by mouth daily.  . traMADol-acetaminophen (ULTRACET) 37.5-325 MG tablet Take 1 tablet by mouth every 6 (six) hours as needed.   Facility-Administered Encounter Medications as of 11/05/2016  Medication  . Influenza vac split quadrivalent PF (FLUARIX) injection 0.5 mL    ALLERGIES:  Allergies  Allergen Reactions  . Nsaids Anaphylaxis    Ibuprofen required hospitalization in 2016 due to ibuprofen  . Ibuprofen Itching  PHYSICAL EXAM:  ECOG Performance status: 0-1 - Symptomatic; remains independent   Vitals:   11/05/16 1305  BP: 127/65  Pulse: 83  Resp: 20  Temp: 98.7 F (37.1 C)  SpO2: 100%   Filed Weights   11/05/16 1305  Weight: 291 lb 4.8 oz (132.1 kg)    Physical Exam  Constitutional: She is oriented to person, place, and time and well-developed, well-nourished, and in no distress.  HENT:  Head: Normocephalic.  Mouth/Throat: Oropharynx is clear and moist. No oropharyngeal exudate.  Eyes: Pupils are equal, round, and reactive to light. Conjunctivae are normal. No scleral icterus.  Neck: Normal range of motion. Neck supple.  Cardiovascular: Normal rate and regular rhythm.   Pulmonary/Chest: Effort normal and breath sounds normal. No respiratory distress.    Abdominal: Soft. Bowel sounds are normal. There is no tenderness.  Musculoskeletal: Normal range of motion. She exhibits no edema.  Lymphadenopathy:    She has no cervical adenopathy.       Right: No supraclavicular adenopathy present.       Left: No supraclavicular adenopathy present.  Neurological: She is alert and oriented to person, place, and time. No cranial nerve deficit. Gait normal.  Skin: Skin is warm and dry. No rash noted.  Psychiatric: Mood, memory, affect and judgment normal.  Nursing note and vitals reviewed.    LABORATORY DATA:  I have reviewed the labs as listed.  CBC    Component Value Date/Time   WBC 4.3 01/28/2016 0846   RBC 4.32 01/28/2016 0846   HGB  13.3 01/28/2016 0846   HCT 40.2 01/28/2016 0846   PLT 211 01/28/2016 0846   MCV 93.1 01/28/2016 0846   MCH 30.8 01/28/2016 0846   MCHC 33.1 01/28/2016 0846   RDW 14.9 01/28/2016 0846   LYMPHSABS 1.6 05/01/2014 1050   MONOABS 0.3 05/01/2014 1050   EOSABS 0.1 05/01/2014 1050   BASOSABS 0.0 05/01/2014 1050   CMP Latest Ref Rng & Units 01/28/2016 04/30/2015 08/01/2014  Glucose 65 - 99 mg/dL 93 104(H) 86  BUN 7 - 25 mg/dL _0 Creatinine 0.50 - 1.05 mg/dL 0.80 0.70 0.69  Sodium 135 - 146 mmol/L 141 139 137  Potassium 3.5 - 5.3 mmol/L 4.2 3.8 3.8  Chloride 98 - 110 mmol/L 109 107 103  CO2 20 - 31 mmol/L _1 Calcium 8.6 - 10.4 mg/dL 9.1 9.3 8.9  Total Protein 6.5 - 8.1 g/dL - - 7.6  Total Bilirubin 0.3 - 1.2 mg/dL - - 0.6  Alkaline Phos 38 - 126 U/L - - 63  AST 15 - 41 U/L - - 22  ALT 14 - 54 U/L - - 27    PENDING LABS:    DIAGNOSTIC IMAGING:  *The following radiologic images and reports have been reviewed independently and agree with below findings.  Last unilateral (R) breast mammogram: 08/31/16 CLINICAL DATA:  History of left mastectomy1999. Right lumpectomy 2013. Six-month follow-up was recommended for abnormality in the left chest wall felt to represent a probably benign fatty mass.  EXAM: 2D DIGITAL DIAGNOSTIC RIGHT MAMMOGRAM WITH CAD AND ADJUNCT TOMO  ULTRASOUND LEFT BREAST  COMPARISON:  08/20/2015 and 08/28/2015  ACR Breast Density Category b: There are scattered areas of fibroglandular density.  FINDINGS: Post operative changes are seen in the rightbreast. No suspicious mass, distortion, or microcalcifications are identified to suggest presence of malignancy.  Mammographic images were processed with CAD.  On physical exam, along the medial inferior aspect of the mastectomy scar,  there is focal soft fullness, within which I palpate a discrete soft mobile oval mass.  Targeted ultrasound is performed, showing an oval parallel hypoechoic mass  with well-defined circumscribed capsule in the 7 o'clock location of the left breast measuring 3.0 x 3.0 x 1.2 cm. On real-time scanning this likely represents a benign lipoma. No suspicious features are identified.  IMPRESSION: 1. Expected postoperative changes in the right breast. No mammographic evidence for malignancy. 2. Stable, benign lipoma along the inferior aspect of the left mastectomy scar.  RECOMMENDATION: Right screening mammogram is recommended in 1 year.  I have discussed the findings and recommendations with the patient. Results were also provided in writing at the conclusion of the visit. If applicable, a reminder letter will be sent to the patient regarding the next appointment.  BI-RADS CATEGORY  2: Benign.   Electronically Signed   By: Nolon Nations M.D.   On: 08/31/2016 11:10     PATHOLOGY:      ASSESSMENT & PLAN:   Bilateral breast cancers, BRCA1+:  -LEFT Stage II breast cancer diagnosed in 1999, reportedly ER-/PR-/HER2-. Treated with left mastectomy and axillary lymph node biopsy. Received chemo with Adriamycin/Cytoxan x 4 cycles, followed by Taxol x 4 cycles.  RIGHT Stage I invasive ductal carcinoma diagnosed in 2013, ER-/PR-/HER2-.  Treated with right breast lumpectomy with sentinel lymph node biopsy by Dr. Dalbert Batman. Also received chemo with Carbo/Taxotere x 6 cycles followed by adjuvant radiation therapy.  -Last unilateral right breast mammogram done in 08/2016 and negative for recurrent disease.  -Clinical breast exam performed and new right breast mass appreciated (see below).   -Return to cancer center in 6 months (or sooner if right breast mass concerning for malignancy on upcoming diagnostic imaging).   BRCA1 positivity:  -Has declined prophylactic risk reduction surgery (right mastectomy, TAH/BSO) in the past.  She is reconsidering having prophylactic right breast mastectomy; she reportedly has follow-up scheduled with Dr. Dalbert Batman in a  few weeks and she plans to discuss that with him then.   -Discussed continuing annual breast MRI (as long as she remains with right breast).  Last MRI breast was done in 08/2015.  Would like to coordinate for her to have her mammograms annually in July and MRIs annually in January, so that her chest is imaged every 6 months by different modalities. She expresses concerns regarding cost of MRI, but states, "I'll figure it out and just make payments if I have to."  She agrees with the above plan.  -If new right breast mass is felt to be benign, then will place orders for breast MRI to be done in 02/2017 in the future.    Right breast mass:  -New palpable breast mass measuring ~2 x 2 cm at 7 o'clock position with mildly irregular borders, directly posterior and adjacent to areola.  Noted in the past ~1 month per patient.  Differential includes fat necrosis vs new malignancy.   -Given her history of bilateral breast cancers and BRCA1 genetic mutation, will obtain (R) breast diagnostic mammogram and ultrasound as soon as able; orders placed today.    Health maintenance/Wellness promotion:  -Annual flu vaccine given today by nursing.  Encouraged continued follow-up with PCP for health maintenance and other cancer screenings as they deem appropriate.   -Encouraged healthy diet and exercise as tolerated.        Dispo:  -Diagnostic right breast mammogram with ultrasound as soon as able for new right breast mass.  -If diagnostic mammogram/ultrasound is negative, then  will proceed with ordering surveillance MRI breast for 02/2017.   -Return to cancer center in 6 months for follow-up. No labs necessary.    All questions were answered to patient's stated satisfaction. Encouraged patient to call with any new concerns or questions before her next visit to the cancer center and we can certain see her sooner, if needed.    Plan of care discussed with Dr. Talbert Cage, who agrees with the above aforementioned.     Orders placed this encounter:  Orders Placed This Encounter  Procedures  . MM DIAG BREAST TOMO UNI RIGHT  . US BREAST LTD UNI RIGHT INC AXILLA      Manila Rommel, NP [place] [phone]

## 2016-11-17 ENCOUNTER — Ambulatory Visit (HOSPITAL_COMMUNITY)
Admission: RE | Admit: 2016-11-17 | Discharge: 2016-11-17 | Disposition: A | Discharge status: Home / Self Care | Payer: BLUE CROSS/BLUE SHIELD | Source: Ambulatory Visit | Attending: Adult Health | Admitting: Adult Health

## 2016-11-17 DIAGNOSIS — N6310 Unspecified lump in the right breast, unspecified quadrant: Secondary | ICD-10-CM | POA: Diagnosis present

## 2016-11-25 ENCOUNTER — Encounter (HOSPITAL_COMMUNITY): Payer: Self-pay

## 2016-12-16 ENCOUNTER — Other Ambulatory Visit: Payer: Self-pay | Admitting: Family Medicine

## 2016-12-17 NOTE — Telephone Encounter (Signed)
Seen [phone] 

## 2017-01-04 ENCOUNTER — Ambulatory Visit (INDEPENDENT_AMBULATORY_CARE_PROVIDER_SITE_OTHER): Payer: BLUE CROSS/BLUE SHIELD | Admitting: Family Medicine

## 2017-01-04 ENCOUNTER — Encounter: Payer: Self-pay | Admitting: Family Medicine

## 2017-01-04 VITALS — BP 130/70 | HR 94 | Resp 16 | Ht 66.0 in | Wt 293.0 lb

## 2017-01-04 DIAGNOSIS — G8929 Other chronic pain: Secondary | ICD-10-CM

## 2017-01-04 DIAGNOSIS — G4489 Other headache syndrome: Secondary | ICD-10-CM | POA: Diagnosis not present

## 2017-01-04 DIAGNOSIS — E8881 Metabolic syndrome: Secondary | ICD-10-CM

## 2017-01-04 DIAGNOSIS — D509 Iron deficiency anemia, unspecified: Secondary | ICD-10-CM

## 2017-01-04 DIAGNOSIS — R7303 Prediabetes: Secondary | ICD-10-CM

## 2017-01-04 DIAGNOSIS — E785 Hyperlipidemia, unspecified: Secondary | ICD-10-CM

## 2017-01-04 DIAGNOSIS — J309 Allergic rhinitis, unspecified: Secondary | ICD-10-CM | POA: Diagnosis not present

## 2017-01-04 DIAGNOSIS — M25561 Pain in right knee: Secondary | ICD-10-CM | POA: Diagnosis not present

## 2017-01-04 NOTE — Patient Instructions (Addendum)
F/u in 4 months, call if you need me before  Weight loss goal of 10 pounds    Change times you eat and food choice   Fasting labs Dec 27 or shortly after, cBC, lipid, cmp and EGFR, hBA1C, TSH and Vit D  We will [facility] Use netty pot / simply saline  To flush sinuses  Consider adding daily generic claritin for allergy symptoms, loratadine  Thank you  for choosing [facility]. We consider it a privelige to serve you.  Delivering excellent health care in a caring and  compassionate way is our goal.  Partnering with you,  so that together we can achieve this goal is our strategy.

## 2017-01-05 ENCOUNTER — Encounter: Payer: Self-pay | Admitting: Family Medicine

## 2017-01-05 NOTE — Assessment & Plan Note (Signed)
The increased risk of cardiovascular disease associated with this diagnosis, and the need to consistently work on lifestyle to change this is discussed. Following  a  heart healthy diet ,commitment to 30 minutes of exercise at least 5 days per week, as well as control of blood sugar and cholesterol , and achieving a healthy weight are all the areas to be addressed .  

## 2017-01-05 NOTE — Assessment & Plan Note (Addendum)
Reports improvement on current regime, weight loss and quad strengthening exercises encouraged

## 2017-01-05 NOTE — Assessment & Plan Note (Signed)
Patient educated about the importance of limiting  Carbohydrate intake , the need to commit to daily physical activity for a minimum of 30 minutes , and to commit weight loss. The fact that changes in all these areas will reduce or eliminate all together the development of diabetes is stressed.  Updated lab needed at/ before next visit.   Diabetic Labs Latest Ref Rng & Units 06/30/2016 01/28/2016 04/30/2015 08/01/2014 07/31/2014  HbA1c <5.7 % 5.9(H) 5.8(H) 6.1(H) - 6.1(H)  Chol <200 mg/dL 197 219(H) - - 211(H)  HDL >50 mg/dL 53 54 - - 63  Calc LDL <100 mg/dL 107(H) 139(H) - - 121(H)  Triglycerides <150 mg/dL 187(H) 131 - - 137  Creatinine 0.50 - 1.05 mg/dL - 0.80 0.70 0.69 -   BP/Weight 01/04/2017 11/05/2016 07/22/2016 07/07/2016 06/30/2016 0/88/1103 02/07/9456  Systolic BP 592 924 462 863 817 711 657  Diastolic BP 70 65 88 95 70 73 84  Wt. (Lbs) 293 291.3 293 294 295 292 291.1  BMI 47.29 47.02 47.29 47.45 47.61 48.59 48.44   No flowsheet data found.

## 2017-01-05 NOTE — Assessment & Plan Note (Signed)
Uncontrolled, add daily claritin, use netty pot and continue daily flonase

## 2017-01-05 NOTE — Assessment & Plan Note (Signed)
Hyperlipidemia:Low fat diet discussed and encouraged.   Lipid Panel  Lab Results  Component Value Date   CHOL 197 06/30/2016   HDL 53 06/30/2016   LDLCALC 107 (H) 06/30/2016   TRIG 187 (H) 06/30/2016   CHOLHDL 3.7 06/30/2016   Updated lab needed at/ before next visit.

## 2017-01-05 NOTE — Assessment & Plan Note (Signed)
Controlled, no change in medication  

## 2017-01-05 NOTE — Assessment & Plan Note (Signed)
Deteriorated. Patient re-educated about  the importance of commitment to a  minimum of 150 minutes of exercise per week.  The importance of healthy food choices with portion control discussed. Encouraged to start a food diary, count calories and to consider  joining a support group. Sample diet sheets offered. Goals set by the patient for the next several months.   Weight /BMI 01/04/2017 11/05/2016 07/22/2016  WEIGHT 293 lb 291 lb 4.8 oz 293 lb  HEIGHT 5\' 6"  - 5\' 6"   BMI 47.29 kg/m2 47.02 kg/m2 47.29 kg/m2

## 2017-01-05 NOTE — Progress Notes (Signed)
Kathleen Reynolds     MRN: [id number]      DOB: 1961/01/04   HPI Kathleen Reynolds is here for follow up and re-evaluation of chronic medical conditions, medication management and review of any available recent lab and radiology data.  Preventive health is updated, specifically  Cancer screening and Immunization.   Questions or concerns regarding consultations or procedures which the PT has had in the interim are  addressed. The PT denies any adverse reactions to current medications since the last visit.  c/o lack of weight loss but will continue to work on this   ROS Denies recent fever or chills. C/o sinus pressure and  nasal congestion,denies ear pain or sore throat. Denies chest congestion, productive cough or wheezing. Denies chest pains, palpitations and leg swelling Denies abdominal pain, nausea, vomiting,diarrhea or constipation.   Denies dysuria, frequency, hesitancy or incontinence. Reports improved mobility of knees and less pain Denies uncontrolled  headaches, seizures, numbness, or tingling. Denies depression, anxiety or insomnia. Denies skin break down or rash.   PE  BP 130/70   Pulse 94   Resp 16   Ht 5\' 6"  (1.676 m)   Wt 293 lb (132.9 kg)   LMP 03/27/2011   SpO2 94%   BMI 47.29 kg/m   Patient alert and oriented and in no cardiopulmonary distress.  HEENT: No facial asymmetry, EOMI,   oropharynx pink and moist.  Neck supple no JVD, no mass.  Chest: Clear to auscultation bilaterally.  CVS: S1, S2 no murmurs, no S3.Regular rate.  ABD: Soft non tender.   Ext: No edema  MS: Adequate ROM spine, shoulders, hips and reduced ROM knees.  Skin: Intact, no ulcerations or rash noted.  Psych: Good eye contact, normal affect. Memory intact not anxious or depressed appearing.  CNS: CN 2-12 intact, power,  normal throughout.no focal deficits noted.   Assessment & Plan  Allergic rhinitis Uncontrolled, add daily claritin, use netty pot and continue daily  flonase  Hyperlipidemia LDL goal <100 Hyperlipidemia:Low fat diet discussed and encouraged.   Lipid Panel  Lab Results  Component Value Date   CHOL 197 06/30/2016   HDL 53 06/30/2016   LDLCALC 107 (H) 06/30/2016   TRIG 187 (H) 06/30/2016   CHOLHDL 3.7 06/30/2016   Updated lab needed at/ before next visit.    Prediabetes Patient educated about the importance of limiting  Carbohydrate intake , the need to commit to daily physical activity for a minimum of 30 minutes , and to commit weight loss. The fact that changes in all these areas will reduce or eliminate all together the development of diabetes is stressed.  Updated lab needed at/ before next visit.   Diabetic Labs Latest Ref Rng & Units 06/30/2016 01/28/2016 04/30/2015 08/01/2014 07/31/2014  HbA1c <5.7 % 5.9(H) 5.8(H) 6.1(H) - 6.1(H)  Chol <200 mg/dL 197 219(H) - - 211(H)  HDL >50 mg/dL 53 54 - - 63  Calc LDL <100 mg/dL 107(H) 139(H) - - 121(H)  Triglycerides <150 mg/dL 187(H) 131 - - 137  Creatinine 0.50 - 1.05 mg/dL - 0.80 0.70 0.69 -   BP/Weight 01/04/2017 11/05/2016 07/22/2016 07/07/2016 06/30/2016 0/93/8182 10/12/3714  Systolic BP 967 893 810 175 102 585 277  Diastolic BP 70 65 88 95 70 73 84  Wt. (Lbs) 293 291.3 293 294 295 292 291.1  BMI 47.29 47.02 47.29 47.45 47.61 48.59 48.44   No flowsheet data found.    Obesity, morbid, BMI 40.0-49.9 (HCC) Deteriorated. Patient re-educated about  the importance  of commitment to a  minimum of 150 minutes of exercise per week.  The importance of healthy food choices with portion control discussed. Encouraged to start a food diary, count calories and to consider  joining a support group. Sample diet sheets offered. Goals set by the patient for the next several months.   Weight /BMI 01/04/2017 11/05/2016 07/22/2016  WEIGHT 293 lb 291 lb 4.8 oz 293 lb  HEIGHT 5\' 6"  - 5\' 6"   BMI 47.29 kg/m2 47.02 kg/m2 47.29 kg/m2      Metabolic syndrome X The increased risk of cardiovascular  disease associated with this diagnosis, and the need to consistently work on lifestyle to change this is discussed. Following  a  heart healthy diet ,commitment to 30 minutes of exercise at least 5 days per week, as well as control of blood sugar and cholesterol , and achieving a healthy weight are all the areas to be addressed .   Chronic pain of right knee Reports improvement on current regime, weight loss and quad strengthening exercises encouraged  Headache Controlled, no change in medication

## 2017-01-15 ENCOUNTER — Other Ambulatory Visit: Payer: Self-pay | Admitting: Family Medicine

## 2017-01-15 ENCOUNTER — Telehealth: Payer: Self-pay | Admitting: Family Medicine

## 2017-01-15 MED ORDER — CYCLOBENZAPRINE HCL 10 MG PO TABS: 10.0000 mg | ORAL_TABLET | Freq: Three times a day (TID) | ORAL | 0 refills | Status: DC | PRN

## 2017-01-15 NOTE — Telephone Encounter (Signed)
Patient left message on nurse line stating that she pulled a muscle and want to know if a muscle relaxer can be called in.

## 2017-01-15 NOTE — Telephone Encounter (Signed)
Flexeril is sent to [facility] a message is left

## 2017-01-29 LAB — COMPLETE METABOLIC PANEL WITH GFR
AG Ratio: 1.8 (calc) (ref 1.0–2.5)
ALKALINE PHOSPHATASE (APISO): 62 U/L (ref 33–130)
ALT: 23 U/L (ref 6–29)
AST: 20 U/L (ref 10–35)
Albumin: 4.2 g/dL (ref 3.6–5.1)
BILIRUBIN TOTAL: 0.5 mg/dL (ref 0.2–1.2)
BUN: 14 mg/dL (ref 7–25)
CHLORIDE: 107 mmol/L (ref 98–110)
CO2: 26 mmol/L (ref 20–32)
Calcium: 9.1 mg/dL (ref 8.6–10.4)
Creat: 0.65 mg/dL (ref 0.50–1.05)
GFR, Est African American: 115 mL/min/{1.73_m2} (ref >=60)
GFR, Est Non African American: 99 mL/min/{1.73_m2} (ref >=60)
GLUCOSE: 105 mg/dL — AB (ref 65–99)
Globulin: 2.4 g/dL (calc) (ref 1.9–3.7)
Potassium: 4 mmol/L (ref 3.5–5.3)
Sodium: 140 mmol/L (ref 135–146)
Total Protein: 6.6 g/dL (ref 6.1–8.1)

## 2017-01-29 LAB — HEMOGLOBIN A1C
EAG (MMOL/L): 7 (calc)
HEMOGLOBIN A1C: 6 %{Hb} — AB (ref <5.7)
Mean Plasma Glucose: 126 (calc)

## 2017-01-29 LAB — CBC
HEMATOCRIT: 37.4 % (ref 35.0–45.0)
Hemoglobin: 12.8 g/dL (ref 11.7–15.5)
MCH: 30.5 pg (ref 27.0–33.0)
MCHC: 34.2 g/dL (ref 32.0–36.0)
MCV: 89 fL (ref 80.0–100.0)
MPV: 11.6 fL (ref 7.5–12.5)
Platelets: 206 10*3/uL (ref 140–400)
RBC: 4.2 10*6/uL (ref 3.80–5.10)
RDW: 14.1 % (ref 11.0–15.0)
WBC: 4.4 10*3/uL (ref 3.8–10.8)

## 2017-01-29 LAB — LIPID PANEL
Cholesterol: 207 mg/dL — ABNORMAL HIGH (ref <200)
HDL: 55 mg/dL (ref >50)
LDL CHOLESTEROL (CALC): 127 mg/dL — AB
NON-HDL CHOLESTEROL (CALC): 152 mg/dL — AB (ref <130)
TRIGLYCERIDES: 134 mg/dL (ref <150)
Total CHOL/HDL Ratio: 3.8 (calc) (ref <5.0)

## 2017-01-29 LAB — VITAMIN D 25 HYDROXY (VIT D DEFICIENCY, FRACTURES): Vit D, 25-Hydroxy: 25 ng/mL — ABNORMAL LOW (ref 30–100)

## 2017-01-29 LAB — TSH: TSH: 2.2 m[IU]/L (ref 0.40–4.50)

## 2017-02-04 ENCOUNTER — Encounter: Payer: Self-pay | Admitting: Family Medicine

## 2017-05-05 ENCOUNTER — Ambulatory Visit (INDEPENDENT_AMBULATORY_CARE_PROVIDER_SITE_OTHER): Payer: BLUE CROSS/BLUE SHIELD | Admitting: Family Medicine

## 2017-05-05 ENCOUNTER — Encounter: Payer: Self-pay | Admitting: Family Medicine

## 2017-05-05 DIAGNOSIS — G4489 Other headache syndrome: Secondary | ICD-10-CM

## 2017-05-05 DIAGNOSIS — E8881 Metabolic syndrome: Secondary | ICD-10-CM

## 2017-05-05 DIAGNOSIS — J309 Allergic rhinitis, unspecified: Secondary | ICD-10-CM

## 2017-05-05 DIAGNOSIS — E785 Hyperlipidemia, unspecified: Secondary | ICD-10-CM | POA: Diagnosis not present

## 2017-05-05 DIAGNOSIS — R7303 Prediabetes: Secondary | ICD-10-CM

## 2017-05-05 MED ORDER — LORATADINE 10 MG PO TABS: 10.0000 mg | ORAL_TABLET | Freq: Every day | ORAL | 11 refills | Status: DC

## 2017-05-05 MED ORDER — FLUTICASONE PROPIONATE 50 MCG/ACT NA SUSP: 1.0000 | Freq: Every day | NASAL | 6 refills | Status: DC

## 2017-05-05 NOTE — Progress Notes (Signed)
Kathleen Reynolds     MRN: [id number]      DOB: 1960-04-07   HPI Kathleen Reynolds is here for follow up and re-evaluation of chronic medical conditions, medication management and review of any available recent lab and radiology data.  Preventive health is updated, specifically  Cancer screening and Immunization.   Questions or concerns regarding consultations or procedures which the PT has had in the interim are  Addressed.Recently had gyne eval C/o increased and un controled allergy symptoms which is expected at this time of the year The PT denies any adverse reactions to current medications since the last visit.  5 pound weight loss in March after changing eating habits, she is motivated to continue this and has a 15 to 20 p0und  Weight loss goal in 4 months.  Increased akllergy symptoms x 1 month ROS Denies recent fever or chills. C/o  sinus pressure, nasal congestion, ear pain or sore throat. Denies chest congestion, productive cough or wheezing. Denies chest pains, palpitations and leg swelling Denies abdominal pain, nausea, vomiting,diarrhea or constipation.   Denies dysuria, frequency, hesitancy or incontinence. C/o  joint pain, swelling and limitation in mobility.affects knees but states notes improvement with her weight loss Denies headaches, seizures, numbness, or tingling. Denies depression, anxiety or insomnia. Denies skin break down or rash.   PE  BP 120/82   Pulse 70   Resp 16   Ht 5\' 6"  (1.676 m)   Wt 288 lb (130.6 kg)   LMP 03/27/2011   SpO2 95%   BMI 46.48 kg/m   Patient alert and oriented and in no cardiopulmonary distress.  HEENT: No facial asymmetry, EOMI,   oropharynx pink and moist.  Neck supple no JVD, no mass.  Chest: Clear to auscultation bilaterally.  CVS: S1, S2 no murmurs, no S3.Regular rate.  ABD: Soft non tender.   Ext: No edema  MS: Adequate ROM spine, shoulders, hips and reduced in knees.  Skin: Intact, no ulcerations or rash  noted.  Psych: Good eye contact, normal affect. Memory intact not anxious or depressed appearing.  CNS: CN 2-12 intact, power,  normal throughout.no focal deficits noted.   Assessment & Plan  Allergic rhinitis Increased and uncontrolled symptoms with season change, pt to commit to daily  Use of appropriate medications  Headache No recent flares , continue fioricet as needed  Hyperlipidemia LDL goal <100 Hyperlipidemia:Low fat diet discussed and encouraged.   Lipid Panel  Lab Results  Component Value Date   CHOL 207 (H) 01/28/2017   HDL 55 01/28/2017   LDLCALC 127 (H) 01/28/2017   TRIG 134 01/28/2017   CHOLHDL 3.8 01/28/2017   Needs to reduce fat in diet , lDL still elevated above goal  Rept lab for next visit in 4 months    Obesity, morbid, BMI 40.0-49.9 (HCC) Improved, reports 5 pound weight loss in past 1 Mon, I have encouraged her to conntinue current behavioral change Patient re-educated about  the importance of commitment to a  minimum of 150 minutes of exercise per week.  The importance of healthy food choices with portion control discussed. Encouraged to start a food diary, count calories and to consider  joining a support group. Sample diet sheets offered. Goals set by the patient for the next several months.   Weight /BMI 05/06/2017 05/05/2017 01/04/2017  WEIGHT 287 lb 1.6 oz 288 lb 293 lb  HEIGHT 5\' 4"  5\' 6"  5\' 6"   BMI 49.28 kg/m2 46.48 kg/m2 47.29 kg/m2  Prediabetes Patient educated about the importance of limiting  Carbohydrate intake , the need to commit to daily physical activity for a minimum of 30 minutes , and to commit weight loss. The fact that changes in all these areas will reduce or eliminate all together the development of diabetes is stressed.  Continue metformin Updated lab needed at/ before next visit.   Diabetic Labs Latest Ref Rng & Units 01/28/2017 06/30/2016 01/28/2016 04/30/2015 08/01/2014  HbA1c <5.7 % of total Hgb 6.0(H) 5.9(H)  5.8(H) 6.1(H) -  Chol <200 mg/dL 207(H) 197 219(H) - -  HDL >50 mg/dL 55 53 54 - -  Calc LDL mg/dL (calc) 127(H) 107(H) 139(H) - -  Triglycerides <150 mg/dL 134 187(H) 131 - -  Creatinine 0.50 - 1.05 mg/dL 0.65 - 0.80 0.70 0.69   BP/Weight 05/06/2017 05/05/2017 01/04/2017 11/05/2016 07/22/2016 07/07/2016 5/49/8264  Systolic BP 158 309 407 680 881 103 159  Diastolic BP 77 82 70 65 88 95 70  Wt. (Lbs) 287.1 288 293 291.3 293 294 295  BMI 49.28 46.48 47.29 47.02 47.29 47.45 47.61   No flowsheet data found.

## 2017-05-05 NOTE — Patient Instructions (Addendum)
F/U in 4 month,  Call if you need me before   Fasting lipid, cmp and EGFR and HBA1C and vit D 1 week before nwext visit  CONGRATS on making YOUR positive change, this will work for you, keep the app going!  It is important that you exercise regularly at least 30 minutes 5 times a week. If you develop chest pain, have severe difficulty breathing, or feel very tired, stop exercising immediately and seek medical attention    Remember to do shelled nuts    Allergy meds are prescribed (2)  Thanks for choosing [facility], we consider it a privelige to serve you.

## 2017-05-06 ENCOUNTER — Encounter (HOSPITAL_COMMUNITY): Payer: Self-pay | Admitting: Internal Medicine

## 2017-05-06 ENCOUNTER — Inpatient Hospital Stay (HOSPITAL_COMMUNITY): Payer: BLUE CROSS/BLUE SHIELD | Attending: Internal Medicine | Admitting: Internal Medicine

## 2017-05-06 ENCOUNTER — Other Ambulatory Visit: Payer: Self-pay

## 2017-05-06 VITALS — BP 131/77 | HR 61 | Temp 97.8°F | Resp 16 | Ht 64.0 in | Wt 287.1 lb

## 2017-05-06 DIAGNOSIS — E119 Type 2 diabetes mellitus without complications: Secondary | ICD-10-CM | POA: Diagnosis not present

## 2017-05-06 DIAGNOSIS — Z853 Personal history of malignant neoplasm of breast: Secondary | ICD-10-CM | POA: Diagnosis present

## 2017-05-06 DIAGNOSIS — Z9012 Acquired absence of left breast and nipple: Secondary | ICD-10-CM | POA: Diagnosis not present

## 2017-05-06 DIAGNOSIS — R221 Localized swelling, mass and lump, neck: Secondary | ICD-10-CM

## 2017-05-06 DIAGNOSIS — Z1501 Genetic susceptibility to malignant neoplasm of breast: Secondary | ICD-10-CM | POA: Insufficient documentation

## 2017-05-06 DIAGNOSIS — C50911 Malignant neoplasm of unspecified site of right female breast: Secondary | ICD-10-CM

## 2017-05-06 DIAGNOSIS — Z171 Estrogen receptor negative status [ER-]: Secondary | ICD-10-CM | POA: Diagnosis not present

## 2017-05-06 NOTE — Patient Instructions (Signed)
[facility]  Discharge Instructions:  You were seen by Dr. Higgs today _______________________________________________________________  Thank you for choosing [facility] to provide your oncology and hematology care.  To afford each patient quality time with our providers, please arrive at least 15 minutes before your scheduled appointment.  You need to re-schedule your appointment if you arrive 10 or more minutes late.  We strive to give you quality time with our providers, and arriving late affects you and other patients whose appointments are after yours.  Also, if you no show three or more times for appointments you may be dismissed from the clinic.  Again, thank you for choosing  [facility]. Our hope is that these requests will allow you access to exceptional care and in a timely manner. _______________________________________________________________  If you have questions after your visit, please contact our office at [phone] between the hours of 8:30 a.m. and 5:00 p.m. Voicemails left after 4:30 p.m. will not be returned until the following business day. _______________________________________________________________  For prescription refill requests, have your pharmacy contact our office. _______________________________________________________________  Recommendations made by the consultant and any test results will be sent to your referring physician. _______________________________________________________________ 

## 2017-05-07 NOTE — Assessment & Plan Note (Signed)
Patient educated about the importance of limiting  Carbohydrate intake , the need to commit to daily physical activity for a minimum of 30 minutes , and to commit weight loss. The fact that changes in all these areas will reduce or eliminate all together the development of diabetes is stressed.  Continue metformin Updated lab needed at/ before next visit.   Diabetic Labs Latest Ref Rng & Units 01/28/2017 06/30/2016 01/28/2016 04/30/2015 08/01/2014  HbA1c <5.7 % of total Hgb 6.0(H) 5.9(H) 5.8(H) 6.1(H) -  Chol <200 mg/dL 207(H) 197 219(H) - -  HDL >50 mg/dL 55 53 54 - -  Calc LDL mg/dL (calc) 127(H) 107(H) 139(H) - -  Triglycerides <150 mg/dL 134 187(H) 131 - -  Creatinine 0.50 - 1.05 mg/dL 0.65 - 0.80 0.70 0.69   BP/Weight 05/06/2017 05/05/2017 01/04/2017 11/05/2016 07/22/2016 07/07/2016 0/22/3361  Systolic BP 224 497 530 051 102 111 735  Diastolic BP 77 82 70 65 88 95 70  Wt. (Lbs) 287.1 288 293 291.3 293 294 295  BMI 49.28 46.48 47.29 47.02 47.29 47.45 47.61   No flowsheet data found.

## 2017-05-07 NOTE — Assessment & Plan Note (Signed)
Increased and uncontrolled symptoms with season change, pt to commit to daily  Use of appropriate medications

## 2017-05-07 NOTE — Assessment & Plan Note (Signed)
No recent flares , continue fioricet as needed

## 2017-05-07 NOTE — Assessment & Plan Note (Signed)
Hyperlipidemia:Low fat diet discussed and encouraged.   Lipid Panel  Lab Results  Component Value Date   CHOL 207 (H) 01/28/2017   HDL 55 01/28/2017   LDLCALC 127 (H) 01/28/2017   TRIG 134 01/28/2017   CHOLHDL 3.8 01/28/2017   Needs to reduce fat in diet , lDL still elevated above goal  Rept lab for next visit in 4 months

## 2017-05-07 NOTE — Progress Notes (Signed)
Diagnosis Infiltrating ductal carcinoma of right breast, stage 1 (HCC) - Plan: MR Breast Bilateral W Contrast, CT SOFT TISSUE NECK W WO CONTRAST, CBC with Differential/Platelet, Comprehensive metabolic panel, Lactate dehydrogenase  Staging Cancer Staging Invasive ductal carcinoma of right breast, stage 1 Staging form: Breast, AJCC 6th Edition - Clinical: Stage I (T1b, N0, M0) - Signed by Baird Cancer, PA on 04/15/2011   Assessment and Plan:  1.  Bilateral breast cancer.  Patient was previously followed by Dr. Talbert Cage.  Pt has history of Right sided triple negative breast cancer 9 mm in size status post wire localization and removal by Dr. Fanny Skates on 02/13/2011 followed by sentinel node biopsy which was negative. Her Ki-67 marker was high at 54%,  HER-2/neu was nonamplified and she was ER/PR negative. She is now status post 6 cycles of carboplatin and Taxotere and S/P radiation. No evidence of recurrence.  2. BRCA1 positivity.  3. History of left-sided breast cancer stage II grade 3 triple negative at that time as well with 7 negative nodes but a 2.3 cm primary with mastectomy on 11/27/1997. She was treated with AC. x4 cycles followed by Taxol x4 cycles thus far without recurrence.   Most recent breast MRI was done 08/28/2015:   IMPRESSION: No MRI evidence for malignancy. Expected postoperative changes following left mastectomy.  Right diagnostic mammogram was done 11/17/2016 and was negative.  She was recommended for right diagnostic mammogram in October 2019.  Patient was previously planned for annual bilateral breast MRI and will be set up for breast MRI in July 2019 and will follow-up at that time to go over the results.  She will have repeat labs performed during that visit.  2.  Left neck swelling.  She reports this occurs periodically.  When questioned she does not feel that this occurs in the arm which could be related to lymphedema.  She will undergo a CT of the neck for  further evaluation.  3.  BRCA1 positivity.Pt has declined prophylactic risk reduction surgery (right mastectomy, TAH/BSO) in the past.  She should follow-up with Dr. Dalbert Batman for discussion. Patient was previously recommended for annual breast MRI (as long as she remains with right breast).  Last MRI breast was done in 08/2015.  When questioned she has not undergone repeat imaging.  She will be set up for bilateral breast MRI in July 2019.  She has undergone right diagnostic mammogram in October 2018 that was negative.  She is recommended for repeat right diagnostic mammogram in October 2019.   Interval history: Bilateral breast cancer, first in 1999, second in 2013 both triple negative Left mastectomy R lumpectomy/XRT BRCA1 positive  Right sided triple negative breast cancer 9 mm in size status post wire localization and removal by Dr. Fanny Skates on 02/13/2011 followed by sentinel node biopsy which was negative. Her Ki-67 marker was high at 54% HER-2/neu was nonamplified and she was ER/PR negative. She was treated with 6 cycles of carboplatin and Taxotere and S/P radiation. No evidence of recurrence.  #2.Left-sided breast cancer stage II grade 3 triple negative at that time as well with 7 negative nodes but a 2.3 cm primary with mastectomy on 11/27/1997. She was treated with AC. x4 cycles followed by Taxol x4 cycles thus far without recurrence.  #3. BRCA1 positivity.   CURRENT THERAPY:Observation and surveillance  INTERVAL HISTORY: 57 y.o. female with Right sided triple negative breast cancer 9 mm in size status post wire localization and removal by Dr. Fanny Skates on 02/13/2011  followed by sentinel node biopsy which was negative. Her Ki-67 marker was high at 54% HER-2/neu was nonamplified and she was ER/PR negative.   Current Status: Patient is seen today for follow-up.  She is complaining of left neck swelling.  She reports this occurs intermittently.  She denies any arm swelling.      Problem List Patient Active Problem List   Diagnosis Date Noted  . Healthcare maintenance [Z00.00] 11/05/2016  . Breast cancer, left breast ([place]) [C50.912] 08/16/2015  . Sciatica of right side associated with disorder of lumbar spine [M53.86] 08/27/2014  . Allergic rhinitis [J30.9] 07/03/2014  . Vertigo, intermittent [R42] 05/20/2014  . Nonspecific elevation of levels of transaminase or lactic acid dehydrogenase (LDH) [R74.0] 05/09/2014  . Rotator cuff syndrome [M75.100] 09/19/2013  . Metabolic syndrome X [Z61.09] 07/02/2012  . Anemia [D64.9] 02/18/2012  . Obesity, morbid, BMI 40.0-49.9 ([place]) [E66.01] 02/18/2012  . OA (osteoarthritis) of knee [M17.10] 12/29/2011  . Chronic pain of right knee [M25.561, G89.29] 12/14/2011  . BRCA1 positive [Z15.01, Z15.09] 07/01/2011  . Invasive ductal carcinoma of right breast, stage 1 [C50.919] 02/18/2011  . Prediabetes [R73.03] 01/22/2011  . Headache [R51] 01/01/2010  . Thyroid nodule [E04.1] 05/17/2009  . Hyperlipidemia LDL goal <100 [E78.5] 02/22/2007    Past Medical History Past Medical History:  Diagnosis Date  . Anemia LIFELONG   HEAVY MENSES MAR 2012 HB 13 MCV 86.7 FERRITIN  66  . Arthritis   . Breast cancer ([place])    Right  . Breast mass in female    right  . 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 Hyperlipidemia   . Left rotator cuff tear   . Migraines   . Morbid obesity ([place])   . Personal history of chemotherapy   . Personal history of radiation therapy   . RUPTURE ROTATOR CUFF 06/08/2007   Qualifier: Diagnosis of  By: Aline Brochure MD, Dorothyann Peng    . Urticaria     Past Surgical History Past Surgical History:  Procedure Laterality Date  . BREAST BIOPSY  02/13/2011   Procedure: BREAST BIOPSY WITH NEEDLE LOCALIZATION;  Surgeon: Adin Hector, MD;  Location: [place];  Service: General;  Laterality: Right;  . BREAST LUMPECTOMY Right 2013  . BREAST SURGERY  11/1997   Left breast cancer-mastectomy  . BREAST SURGERY  2/13-    rt axilly bx  . COLONOSCOPY  2004 DR. SMITH   No polyp, hemorrhoids  . COLONOSCOPY  06/09/10   UEA:[id number] adenomas   . COLONOSCOPY N/A 08/19/2015   Procedure: COLONOSCOPY;  Surgeon: Danie Binder, MD;  [facility];  Service: Endoscopy;  Laterality: N/A;  130 - moved to 12:45 - Ginger notified pt  . DILATION AND CURETTAGE OF UTERUS    . DILATION AND CURETTAGE OF UTERUS  05/2002  . hystersonogram  2009  . MASTECTOMY  1999   left breast cancer   . PORT-A-CATH REMOVAL  10/20/2011   Procedure: MINOR REMOVAL PORT-A-CATH;  Surgeon: Adin Hector, MD;  Location: [place];  Service: General;  Laterality: N/A;  Porta-cath removal  left  . PORTACATH PLACEMENT  03/27/2011   Procedure: INSERTION PORT-A-CATH;  Surgeon: Adin Hector, MD;  Location: [place];  Service: General;  Laterality: Left;  insert port a cath    Family History Family History  Problem Relation Age of Onset  . Hypertension Mother   . Cancer Father        lung cancer  . Diabetes Sister   .  Cancer Brother        colon cancer  . Heart disease Sister        heart attack  . Asthma Sister   . Eczema Sister   . Allergic rhinitis Neg Hx   . Urticaria Neg Hx      Social History  reports that she has never smoked. She has never used smokeless tobacco. She reports that she drinks about 1.0 oz of alcohol per week. She reports that she does not use drugs.  Medications  Current Outpatient Medications:  .  butalbital-acetaminophen-caffeine (FIORICET, ESGIC) 50-325-40 MG tablet, Take 1 tablet by mouth 2 (two) times daily as needed. For migraine, Disp: 14 tablet, Rfl: 3 .  cyanocobalamin 100 MCG tablet, Take 100 mcg by mouth daily., Disp: , Rfl:  .  cyclobenzaprine (FLEXERIL) 10 MG tablet, Take 1 tablet (10 mg total) by mouth 3 (three) times daily as needed for muscle spasms., Disp: 30 tablet, Rfl: 0 .  fluticasone (FLONASE) 50 MCG/ACT nasal spray, Place 1 spray into both nostrils  daily., Disp: 16 g, Rfl: 6 .  loratadine (CLARITIN) 10 MG tablet, Take 1 tablet (10 mg total) by mouth daily., Disp: 30 tablet, Rfl: 11 .  metFORMIN (GLUCOPHAGE) 500 MG tablet, TAKE 1 TABLET BY MOUTH TWICE A DAY WITH FOOD, Disp: 180 tablet, Rfl: 1 .  Multiple Vitamin (MULITIVITAMIN WITH MINERALS) TABS, Take 1 tablet by mouth daily., Disp: , Rfl:   Allergies Nsaids and Ibuprofen  Review of Systems Review of Systems - Oncology ROS as per HPI otherwise 12 point ROS is negative.   Physical Exam  Vitals Wt Readings from Last 3 Encounters:  05/06/17 287 lb 1.6 oz (130.2 kg)  05/05/17 288 lb (130.6 kg)  01/04/17 293 lb (132.9 kg)   Temp Readings from Last 3 Encounters:  05/06/17 97.8 F (36.6 C) (Oral)  11/05/16 98.7 F (37.1 C) (Oral)  07/07/16 97.7 F (36.5 C)   BP Readings from Last 3 Encounters:  05/06/17 131/77  05/05/17 120/82  01/04/17 130/70   Pulse Readings from Last 3 Encounters:  05/06/17 61  05/05/17 70  01/04/17 94   Constitutional: Well-developed, well-nourished, and in no distress.   HENT: Head: Normocephalic and atraumatic.  Mouth/Throat: No oropharyngeal exudate. Mucosa moist. Eyes: Pupils are equal, round, and reactive to light. Conjunctivae are normal. No scleral icterus.  Neck: Normal range of motion. Neck supple. No JVD present.  No significant swelling noted on exam today. Cardiovascular: Normal rate, regular rhythm and normal heart sounds.  Exam reveals no gallop and no friction rub.   No murmur heard. Pulmonary/Chest: Effort normal and breath sounds normal. No respiratory distress. No wheezes.No rales.  Abdominal: Soft. Bowel sounds are normal. No distension. There is no tenderness. There is no guarding.  Musculoskeletal: No edema or tenderness.  Lymphadenopathy: No cervical, axillary or supraclavicular adenopathy.  Neurological: Alert and oriented to person, place, and time. No cranial nerve deficit.  Skin: Skin is warm and dry. No rash noted. No  erythema. No pallor.  Psychiatric: Affect and judgment normal.  Bilateral breast exam: Left mastectomy.  Right breast shows no dominant masses, no nipple discharge, no retractions.  Labs No visits with results within 3 Day(s) from this visit.  Latest known visit with results is:  Office Visit on 01/04/2017  Component Date Value Ref Range Status  . WBC 01/28/2017 4.4  3.8 - 10.8 Thousand/uL Final  . RBC 01/28/2017 4.20  3.80 - 5.10 Million/uL Final  . Hemoglobin 01/28/2017 12.8  11.7 - 15.5 g/dL Final  . HCT 01/28/2017 37.4  35.0 - 45.0 % Final  . MCV 01/28/2017 89.0  80.0 - 100.0 fL Final  . MCH 01/28/2017 30.5  27.0 - 33.0 pg Final  . MCHC 01/28/2017 34.2  32.0 - 36.0 g/dL Final  . RDW 01/28/2017 14.1  11.0 - 15.0 % Final  . Platelets 01/28/2017 206  140 - 400 Thousand/uL Final  . MPV 01/28/2017 11.6  7.5 - 12.5 fL Final  . Cholesterol 01/28/2017 207* <200 mg/dL Final  . HDL 01/28/2017 55  >50 mg/dL Final  . Triglycerides 01/28/2017 134  <150 mg/dL Final  . LDL Cholesterol (Calc) 01/28/2017 127* mg/dL (calc) Final   Comment: Reference range: <100 . Desirable range <100 mg/dL for primary prevention;   <70 mg/dL for patients with CHD or diabetic patients  with > or = 2 CHD risk factors. Marland Kitchen LDL-C is now calculated using the Martin-Hopkins  calculation, which is a validated novel method providing  better accuracy than the Friedewald equation in the  estimation of LDL-C.  Cresenciano Genre et al. Annamaria Helling. 0086;761(95): 2061-2068  (http://education.[facility].com/faq/FAQ164)   . Total CHOL/HDL Ratio 01/28/2017 3.8  <5.0 (calc) Final  . Non-HDL Cholesterol (Calc) 01/28/2017 152* <130 mg/dL (calc) Final   Comment: For patients with diabetes plus 1 major ASCVD risk  factor, treating to a non-HDL-C goal of <100 mg/dL  (LDL-C of <70 mg/dL) is considered a therapeutic  option.   . Glucose, Bld 01/28/2017 105* 65 - 99 mg/dL Final   Comment: .            Fasting reference interval . For  someone without known diabetes, a glucose value between 100 and 125 mg/dL is consistent with prediabetes and should be confirmed with a follow-up test. .   . BUN 01/28/2017 14  7 - 25 mg/dL Final  . Creat 01/28/2017 0.65  0.50 - 1.05 mg/dL Final   Comment: For patients >31 years of age, the reference limit for Creatinine is approximately 13% higher for people identified as African-American. .   . GFR, Est Non African American 01/28/2017 99  > OR = 60 mL/min/1.8m Final  . GFR, Est African American 01/28/2017 115  > OR = 60 mL/min/1.782mFinal  . BUN/Creatinine Ratio 1209/32/6712OT APPLICABLE  6 - 22 (calc) Final  . Sodium 01/28/2017 140  135 - 146 mmol/L Final  . Potassium 01/28/2017 4.0  3.5 - 5.3 mmol/L Final  . Chloride 01/28/2017 107  98 - 110 mmol/L Final  . CO2 01/28/2017 26  20 - 32 mmol/L Final  . Calcium 01/28/2017 9.1  8.6 - 10.4 mg/dL Final  . Total Protein 01/28/2017 6.6  6.1 - 8.1 g/dL Final  . Albumin 01/28/2017 4.2  3.6 - 5.1 g/dL Final  . Globulin 01/28/2017 2.4  1.9 - 3.7 g/dL (calc) Final  . AG Ratio 01/28/2017 1.8  1.0 - 2.5 (calc) Final  . Total Bilirubin 01/28/2017 0.5  0.2 - 1.2 mg/dL Final  . Alkaline phosphatase (APISO) 01/28/2017 62  33 - 130 U/L Final  . AST 01/28/2017 20  10 - 35 U/L Final  . ALT 01/28/2017 23  6 - 29 U/L Final  . TSH 01/28/2017 2.20  0.40 - 4.50 mIU/L Final  . Hgb A1c MFr Bld 01/28/2017 6.0* <5.7 % of total Hgb Final   Comment: For someone without known diabetes, a hemoglobin  A1c value between 5.7% and 6.4% is consistent with prediabetes and should be confirmed with a  follow-up test. . For someone with known diabetes, a value <7% indicates that their diabetes is well controlled. A1c targets should be individualized based on duration of diabetes, age, comorbid conditions, and other considerations. . This assay result is consistent with an increased risk of diabetes. . Currently, no consensus exists regarding use  of hemoglobin A1c for diagnosis of diabetes for children. .   . Mean Plasma Glucose 01/28/2017 126  (calc) Final  . eAG (mmol/L) 01/28/2017 7.0  (calc) Final  . Vit D, 25-Hydroxy 01/28/2017 25* 30 - 100 ng/mL Final   Comment: Vitamin D Status         25-OH Vitamin D: . Deficiency:                    <20 ng/mL Insufficiency:             20 - 29 ng/mL Optimal:                 > or = 30 ng/mL . For 25-OH Vitamin D testing on patients on  D2-supplementation and patients for whom quantitation  of D2 and D3 fractions is required, the Quest[facility]ureD(TM) 25-OH VIT D, (D2,D3), LC/MS/MS is recommended: order  code [id number] (patients >41yr). . For more information on this test, go to: http://education.quest[facility].com/faq/FAQ163 (This link is being provided for  informational/educational purposes only.)      Pathology Orders Placed This Encounter  Procedures  . MR Breast Bilateral W Contrast    Standing Status:   Future    Standing Expiration Date:   05/07/2018    Order Specific Question:   If indicated for the ordered procedure, I authorize the administration of contrast media per Radiology protocol    Answer:   Yes    Order Specific Question:   What is the patient's sedation requirement?    Answer:   No Sedation    Order Specific Question:   Does the patient have a pacemaker or implanted devices?    Answer:   No    Order Specific Question:   Radiology Contrast Protocol - do NOT remove file path    Answer:   \\[email or web address]    Order Specific Question:   [facility]    Order Specific Question:   Is patient pregnant?    Answer:   No  . CT SOFT TISSUE NECK W WO CONTRAST    Standing Status:   Future    Standing Expiration Date:   05/07/2018    Order Specific Question:   If indicated for the ordered procedure, I authorize the administration of contrast media per Radiology protocol    Answer:    Yes    Order Specific Question:   Is patient pregnant?    Answer:   No    Order Specific Question:   [facility]    [facility]   Order Specific Question:   Radiology Contrast Protocol - do NOT remove file path    Answer:   \\[email or web address]    Order Specific Question:   Reason for Exam additional comments    Answer:   breast cancer, neck swelling  . CBC with Differential/Platelet    Standing Status:   Future    Standing Expiration Date:   05/07/2018  . Comprehensive metabolic panel    Standing Status:   Future    Standing Expiration Date:   05/07/2018  .  Lactate dehydrogenase    Standing Status:   Future    Standing Expiration Date:   05/07/2018       Zoila Shutter MD

## 2017-05-07 NOTE — Assessment & Plan Note (Signed)
Improved, reports 5 pound weight loss in past 1 Mon, I have encouraged her to conntinue current behavioral change Patient re-educated about  the importance of commitment to a  minimum of 150 minutes of exercise per week.  The importance of healthy food choices with portion control discussed. Encouraged to start a food diary, count calories and to consider  joining a support group. Sample diet sheets offered. Goals set by the patient for the next several months.   Weight /BMI 05/06/2017 05/05/2017 01/04/2017  WEIGHT 287 lb 1.6 oz 288 lb 293 lb  HEIGHT 5\' 4"  5\' 6"  5\' 6"   BMI 49.28 kg/m2 46.48 kg/m2 47.29 kg/m2

## 2017-05-25 ENCOUNTER — Ambulatory Visit (HOSPITAL_COMMUNITY)
Admission: RE | Admit: 2017-05-25 | Discharge: 2017-05-25 | Disposition: A | Discharge status: Home / Self Care | Payer: BLUE CROSS/BLUE SHIELD | Source: Ambulatory Visit | Attending: Internal Medicine | Admitting: Internal Medicine

## 2017-05-25 ENCOUNTER — Encounter (HOSPITAL_COMMUNITY): Payer: Self-pay

## 2017-05-25 DIAGNOSIS — C50911 Malignant neoplasm of unspecified site of right female breast: Secondary | ICD-10-CM

## 2017-05-25 LAB — POCT I-STAT CREATININE: CREATININE: 0.8 mg/dL (ref 0.44–1.00)

## 2017-05-25 MED ORDER — IOPAMIDOL (ISOVUE-300) INJECTION 61%
75.0000 mL | Freq: Once | INTRAVENOUS | Status: AC | PRN
  Administered 2017-05-25: 100 mL via INTRAVENOUS

## 2017-06-17 ENCOUNTER — Other Ambulatory Visit: Payer: Self-pay | Admitting: Family Medicine

## 2017-06-22 ENCOUNTER — Telehealth: Payer: Self-pay | Admitting: Orthopedic Surgery

## 2017-06-22 NOTE — Telephone Encounter (Signed)
No go

## 2017-06-22 NOTE — Telephone Encounter (Signed)
We received a fax from [place] this morning.  This patient is requesting refill on Tramadol-Aetaminophen 37.5-325 mgs.    SHE HAS NOT BEEN SEEN HERE SINCE 07/22/16

## 2017-06-22 NOTE — Telephone Encounter (Signed)
She needs appointment please

## 2017-06-23 NOTE — Telephone Encounter (Signed)
I called Kathleen Reynolds back and advised her that no meds could be done at this time for her.  I offered her an appointment but she said she was seeing her PCP in the next few days and would see if they would write prescription for her at that time.

## 2017-08-16 ENCOUNTER — Telehealth (HOSPITAL_COMMUNITY): Payer: Self-pay | Admitting: Internal Medicine

## 2017-08-17 ENCOUNTER — Telehealth (HOSPITAL_COMMUNITY): Payer: Self-pay | Admitting: Internal Medicine

## 2017-08-17 NOTE — Telephone Encounter (Signed)
spk to pt and advised her of ins denial for mri breast. Advised her of the appeal process and she needs to sign a permission form in order for [place] to submit the appeal to the health plan. She will come by on 7/17 to sign.

## 2017-08-18 ENCOUNTER — Ambulatory Visit (HOSPITAL_COMMUNITY): Payer: BLUE CROSS/BLUE SHIELD

## 2017-08-20 ENCOUNTER — Ambulatory Visit (HOSPITAL_COMMUNITY): Payer: Self-pay | Admitting: Oncology

## 2017-09-08 ENCOUNTER — Ambulatory Visit: Payer: Self-pay | Admitting: Family Medicine

## 2017-11-15 ENCOUNTER — Encounter: Payer: Self-pay | Admitting: Family Medicine

## 2017-11-15 ENCOUNTER — Ambulatory Visit (INDEPENDENT_AMBULATORY_CARE_PROVIDER_SITE_OTHER): Payer: BLUE CROSS/BLUE SHIELD | Admitting: Family Medicine

## 2017-11-15 VITALS — BP 130/62 | HR 73 | Resp 12 | Ht 65.0 in | Wt 291.0 lb

## 2017-11-15 DIAGNOSIS — G4489 Other headache syndrome: Secondary | ICD-10-CM

## 2017-11-15 DIAGNOSIS — R7303 Prediabetes: Secondary | ICD-10-CM | POA: Diagnosis not present

## 2017-11-15 DIAGNOSIS — E8881 Metabolic syndrome: Secondary | ICD-10-CM

## 2017-11-15 DIAGNOSIS — E785 Hyperlipidemia, unspecified: Secondary | ICD-10-CM | POA: Diagnosis not present

## 2017-11-15 DIAGNOSIS — C50911 Malignant neoplasm of unspecified site of right female breast: Secondary | ICD-10-CM

## 2017-11-15 DIAGNOSIS — M65312 Trigger thumb, left thumb: Secondary | ICD-10-CM

## 2017-11-15 DIAGNOSIS — Z23 Encounter for immunization: Secondary | ICD-10-CM

## 2017-11-15 MED ORDER — MECLIZINE HCL 25 MG PO TABS: 25.0000 mg | ORAL_TABLET | Freq: Three times a day (TID) | ORAL | 2 refills | Status: DC | PRN

## 2017-11-15 NOTE — Progress Notes (Signed)
Kathleen Reynolds     MRN: [id number]      DOB: 29-Jun-1960   HPI Kathleen Reynolds is here for follow up and re-evaluation of chronic medical conditions, medication management and review of any available recent lab and radiology data.  Preventive health is updated, specifically  Cancer screening and Immunization.   Questions or concerns regarding consultations or procedures which the PT has had in the interim are  addressed. The PT denies any adverse reactions to current medications since the last visit.  Trigger thumb left  x 1 month, also nodules, watching both Needs oncology and imaging  ROS Denies recent fever or chills. Denies sinus pressure, nasal congestion, ear pain or sore throat. Denies chest congestion, productive cough or wheezing. Denies chest pains, palpitations and leg swelling Denies abdominal pain, nausea, vomiting,diarrhea or constipation.   Denies dysuria, frequency, hesitancy or incontinence.  Denies headaches, seizures, numbness, or tingling. Denies depression, anxiety or insomnia. Denies skin break down or rash.   PE  BP 130/62 (BP Location: Right Arm, Patient Position: Sitting, Cuff Size: Large)   Pulse 73   Resp 12   Ht 5\' 5"  (1.651 m)   Wt 291 lb (132 kg)   LMP 03/27/2011   SpO2 96%   BMI 48.42 kg/m   Patient alert and oriented and in no cardiopulmonary distress.  HEENT: No facial asymmetry, EOMI,   oropharynx pink and moist.  Neck supple no JVD, no mass.  Chest: Clear to auscultation bilaterally.  CVS: S1, S2 no murmurs, no S3.Regular rate.  ABD: Soft non tender.   Ext: No edema  MS: Adequate ROM spine, shoulders, hips and knees.Left trigger thumb  Skin: Intact, no ulcerations or rash noted.  Psych: Good eye contact, normal affect. Memory intact not anxious or depressed appearing.  CNS: CN 2-12 intact, power,  normal throughout.no focal deficits noted.   Assessment & Plan  Invasive ductal carcinoma of right breast, stage 1 Oncology  eval and,mammogram due , will refer and schedule  Obesity, morbid, BMI 40.0-49.9 (HCC) Deteriorated. Patient re-educated about  the importance of commitment to a  minimum of 150 minutes of exercise per week.  The importance of healthy food choices with portion control discussed. Encouraged to start a food diary, count calories and to consider  joining a support group. Sample diet sheets offered. Goals set by the patient for the next several months.   Weight /BMI 11/19/2017 11/15/2017 05/06/2017  WEIGHT 290 lb 291 lb 287 lb 1.6 oz  HEIGHT - 5\' 5"  5\' 4"   BMI 48.26 kg/m2 48.42 kg/m2 49.28 kg/m2      Prediabetes Patient educated about the importance of limiting  Carbohydrate intake , the need to commit to daily physical activity for a minimum of 30 minutes , and to commit weight loss. The fact that changes in all these areas will reduce or eliminate all together the development of diabetes is stressed.  UnchANGED, CONTINUE METFORMINDAILY  Diabetic Labs Latest Ref Rng & Units 11/16/2017 05/25/2017 01/28/2017 06/30/2016 01/28/2016  HbA1c <5.7 % of total Hgb 6.0(H) - 6.0(H) 5.9(H) 5.8(H)  Chol <200 mg/dL 216(H) - 207(H) 197 219(H)  HDL >50 mg/dL 55 - 55 53 54  Calc LDL mg/dL (calc) 133(H) - 127(H) 107(H) 139(H)  Triglycerides <150 mg/dL 149 - 134 187(H) 131  Creatinine 0.50 - 1.05 mg/dL 0.66 0.80 0.65 - 0.80   BP/Weight 11/19/2017 11/15/2017 05/06/2017 05/05/2017 01/04/2017 11/05/2016 02/26/5807  Systolic BP 983 382 505 397 673 419 379  Diastolic BP 77  62 77 82 70 65 88  Wt. (Lbs) 290 291 287.1 288 293 291.3 293  BMI 48.26 48.42 49.28 46.48 47.29 47.02 47.29   No flowsheet data found.    Headache Controlled, no change in medication   Trigger thumb, left thumb OBSERVATION ONLY AT THIS TIME  Hyperlipidemia LDL goal <100 Hyperlipidemia:Low fat diet discussed and encouraged.   Lipid Panel  Lab Results  Component Value Date   CHOL 216 (H) 11/16/2017   HDL 55 11/16/2017   LDLCALC 133  (H) 11/16/2017   TRIG 149 11/16/2017   CHOLHDL 3.9 11/16/2017   Needs to reduce fried and fatty foods

## 2017-11-15 NOTE — Assessment & Plan Note (Addendum)
Oncology eval and,mammogram due , will refer and schedule

## 2017-11-15 NOTE — Patient Instructions (Addendum)
F/U in 5 months, call if you need me before  Flu vaccine today  Please wait for mammogram appt at front desk , and  You are referred to Oncology  Fasting lipid, cmp and EGFr, hBA1C , in the morning please will mail result note if unable to get you on the phone  It is important that you exercise regularly at least 30 minutes 5 times a week. If you develop chest pain, have severe difficulty breathing, or feel very tired, stop exercising immediately and seek medical attention    Weight loss is vital to preserve our  Liver and keep away diabetes  Thank you  for choosing [facility]. We consider it a privelige to serve you.  Delivering excellent health care in a caring and  compassionate way is our goal.  Partnering with you,  so that together we can achieve this goal is our strategy.

## 2017-11-16 ENCOUNTER — Other Ambulatory Visit: Payer: Self-pay | Admitting: Family Medicine

## 2017-11-16 DIAGNOSIS — Z1231 Encounter for screening mammogram for malignant neoplasm of breast: Secondary | ICD-10-CM

## 2017-11-17 ENCOUNTER — Encounter: Payer: Self-pay | Admitting: Family Medicine

## 2017-11-17 LAB — COMPLETE METABOLIC PANEL WITH GFR
AG RATIO: 1.6 (calc) (ref 1.0–2.5)
ALT: 21 U/L (ref 6–29)
AST: 19 U/L (ref 10–35)
Albumin: 4.2 g/dL (ref 3.6–5.1)
Alkaline phosphatase (APISO): 61 U/L (ref 33–130)
BUN: 11 mg/dL (ref 7–25)
CALCIUM: 9.1 mg/dL (ref 8.6–10.4)
CO2: 22 mmol/L (ref 20–32)
Chloride: 105 mmol/L (ref 98–110)
Creat: 0.66 mg/dL (ref 0.50–1.05)
GFR, EST AFRICAN AMERICAN: 114 mL/min/{1.73_m2} (ref >=60)
GFR, EST NON AFRICAN AMERICAN: 98 mL/min/{1.73_m2} (ref >=60)
GLUCOSE: 103 mg/dL — AB (ref 65–99)
Globulin: 2.7 g/dL (calc) (ref 1.9–3.7)
Potassium: 3.9 mmol/L (ref 3.5–5.3)
Sodium: 140 mmol/L (ref 135–146)
TOTAL PROTEIN: 6.9 g/dL (ref 6.1–8.1)
Total Bilirubin: 0.4 mg/dL (ref 0.2–1.2)

## 2017-11-17 LAB — HEMOGLOBIN A1C
EAG (MMOL/L): 7 (calc)
Hgb A1c MFr Bld: 6 % of total Hgb — ABNORMAL HIGH (ref <5.7)
Mean Plasma Glucose: 126 (calc)

## 2017-11-17 LAB — LIPID PANEL
CHOLESTEROL: 216 mg/dL — AB (ref <200)
HDL: 55 mg/dL (ref >50)
LDL Cholesterol (Calc): 133 mg/dL (calc) — ABNORMAL HIGH
Non-HDL Cholesterol (Calc): 161 mg/dL (calc) — ABNORMAL HIGH (ref <130)
TRIGLYCERIDES: 149 mg/dL (ref <150)
Total CHOL/HDL Ratio: 3.9 (calc) (ref <5.0)

## 2017-11-19 ENCOUNTER — Other Ambulatory Visit: Payer: Self-pay

## 2017-11-19 ENCOUNTER — Inpatient Hospital Stay (HOSPITAL_COMMUNITY): Payer: BLUE CROSS/BLUE SHIELD | Attending: Internal Medicine | Admitting: Internal Medicine

## 2017-11-19 ENCOUNTER — Encounter (HOSPITAL_COMMUNITY): Payer: Self-pay | Admitting: Internal Medicine

## 2017-11-19 VITALS — BP 151/77 | HR 66 | Temp 98.2°F | Resp 16 | Wt 290.0 lb

## 2017-11-19 DIAGNOSIS — Z853 Personal history of malignant neoplasm of breast: Secondary | ICD-10-CM

## 2017-11-19 DIAGNOSIS — C50911 Malignant neoplasm of unspecified site of right female breast: Secondary | ICD-10-CM

## 2017-11-19 DIAGNOSIS — Z171 Estrogen receptor negative status [ER-]: Secondary | ICD-10-CM

## 2017-11-19 DIAGNOSIS — Z9012 Acquired absence of left breast and nipple: Secondary | ICD-10-CM | POA: Diagnosis not present

## 2017-11-19 DIAGNOSIS — E041 Nontoxic single thyroid nodule: Secondary | ICD-10-CM | POA: Diagnosis not present

## 2017-11-19 DIAGNOSIS — Z1501 Genetic susceptibility to malignant neoplasm of breast: Secondary | ICD-10-CM | POA: Diagnosis not present

## 2017-11-19 NOTE — Patient Instructions (Addendum)
[place] at [facility] Discharge Instructions  Keep your mammogram appt on Monday  Return to clinic in 6 months  We will put in an appt for a breast MRI again - it may or may not get authorized. Please call Durene Cal @ [phone] or Amy @ [phone] if you have not heard about this being scheduled/authorized.   We will perform labs once a year on you.   Copy of labs given to you today.    Thank you for choosing [place] at [facility] to provide your oncology and hematology care.  To afford each patient quality time with our provider, please arrive at least 15 minutes before your scheduled appointment time.   If you have a lab appointment with the [facility] please come in thru the  Main Entrance and check in at the main information desk  You need to re-schedule your appointment should you arrive 10 or more minutes late.  We strive to give you quality time with our providers, and arriving late affects you and other patients whose appointments are after yours.  Also, if you no show three or more times for appointments you may be dismissed from the clinic at the providers discretion.     Again, thank you for choosing [facility].  Our hope is that these requests will decrease the amount of time that you wait before being seen by our physicians.       _____________________________________________________________  Should you have questions after your visit to [facility], please contact our office at [phone] between the hours of 8:00 a.m. and 4:30 p.m.  Voicemails left after 4:00 p.m. will not be returned until the following business day.  For prescription refill requests, have your pharmacy contact our office and allow 72 hours.    Cancer Center Support Programs:   > Cancer Support Group  2nd Tuesday of the month 1pm-2pm, Journey Room

## 2017-11-19 NOTE — Progress Notes (Signed)
Diagnosis Infiltrating ductal carcinoma of right breast, stage 1 (HCC) - Plan: CBC with Differential/Platelet, Comprehensive metabolic panel, Lactate dehydrogenase, MR Breast Bilateral Wo Contrast  Staging Cancer Staging Invasive ductal carcinoma of right breast, stage 1 Staging form: Breast, AJCC 6th Edition - Clinical: Stage I (T1b, N0, M0) - Signed by Baird Cancer, PA on 04/15/2011   Assessment and Plan:  1.  Bilateral breast cancer.  Patient was previously followed by Dr. Talbert Cage.  Pt has history of Right sided triple negative breast cancer 9 mm in size status post wire localization and removal by Dr. Fanny Skates on 02/13/2011 followed by sentinel node biopsy which was negative. Her Ki-67 marker was high at 54%,  HER-2/neu was nonamplified and she was ER/PR negative. She is now status post 6 cycles of carboplatin and Taxotere and S/P radiation. No evidence of recurrence.  2. BRCA1 positivity.  3. History of left-sided breast cancer stage II grade 3 triple negative at that time as well with 7 negative nodes but a 2.3 cm primary with mastectomy on 11/27/1997. She was treated with AC. x4 cycles followed by Taxol x4 cycles thus far without recurrence.   Most recent breast MRI was done 08/28/2015:   IMPRESSION: No MRI evidence for malignancy. Expected postoperative changes following left mastectomy.  Right diagnostic mammogram was done 11/17/2016 and was negative.  She was recommended for right diagnostic mammogram in October 2019.  She reports she is scheduled for mammogram of right breast on Monday 11/22/2017.    Patient was previously planned for annual bilateral breast MRI and was set up for breast MRI in July 2019 but reports this was cancelled due to insurance not wanting to pay.  Pt should proceed with Mammogram and will reorder MRI for 05/2018 pending Insurance review. She will have labs done in 05/2018.   2.  Left neck swelling.  Pt underwent CT of the neck for further evaluation on  05/25/2017 and showed no neck mass or adenopathy.  She denies any neck complaints today.    3.  BRCA1 positivity.Pt has declined prophylactic risk reduction surgery (right mastectomy, TAH/BSO) in the past.  She should follow-up with Dr. Dalbert Batman for discussion. Patient was previously recommended for annual breast MRI (as long as she remains with right breast).  Last MRI breast was done in 08/2015.  When questioned she has not undergone repeat imaging.  She was set up for bilateral breast MRI in July 2019. She reports insurance denied imaging.   She has undergone right diagnostic mammogram in October 2018 that was negative.  She reports right diagnostic mammogram is scheduled for 11/22/2017.  She is recommended to keep imaging appointment.    CURRENT THERAPY:Observation and surveillance   Interval history:Obtained from note dated 05/06/2017.   Bilateral breast cancer, first in 1999, second in 2013 both triple negative Left mastectomy R lumpectomy/XRT BRCA1 positive  Right sided triple negative breast cancer 9 mm in size status post wire localization and removal by Dr. Fanny Skates on 02/13/2011 followed by sentinel node biopsy which was negative. Her Ki-67 marker was high at 54% HER-2/neu was nonamplified and she was ER/PR negative. She was treated with 6 cycles of carboplatin and Taxotere and S/P radiation. No evidence of recurrence.  #2.Left-sided breast cancer stage II grade 3 triple negative at that time as well with 7 negative nodes but a 2.3 cm primary with mastectomy on 11/27/1997. She was treated with AC. x4 cycles followed by Taxol x4 cycles thus far without recurrence.  #3.  BRCA1 positivity.    INTERVAL HISTORY:Historical 57 y.o. female with Right sided triple negative breast cancer 9 mm in size status post wire localization and removal by Dr. Fanny Skates on 02/13/2011 followed by sentinel node biopsy which was negative. Her Ki-67 marker was high at 54% HER-2/neu was nonamplified and  she was ER/PR negative.   Current Status: Patient is seen today for follow-up.  She has not had mammogram yet and reports it is scheduled for Monday.    Problem List Patient Active Problem List   Diagnosis Date Noted  . Healthcare maintenance [Z00.00] 11/05/2016  . Breast cancer, left breast ([place]) [C50.912] 08/16/2015  . Sciatica of right side associated with disorder of lumbar spine [M53.86] 08/27/2014  . Allergic rhinitis [J30.9] 07/03/2014  . Vertigo, intermittent [R42] 05/20/2014  . Nonspecific elevation of levels of transaminase or lactic acid dehydrogenase (LDH) [R74.0] 05/09/2014  . Rotator cuff syndrome [M75.100] 09/19/2013  . Metabolic syndrome X [O84.16] 07/02/2012  . Anemia [D64.9] 02/18/2012  . Obesity, morbid, BMI 40.0-49.9 ([place]) [E66.01] 02/18/2012  . OA (osteoarthritis) of knee [M17.10] 12/29/2011  . Chronic pain of right knee [M25.561, G89.29] 12/14/2011  . BRCA1 positive [Z15.01, Z15.09] 07/01/2011  . Invasive ductal carcinoma of right breast, stage 1 [C50.919] 02/18/2011  . Prediabetes [R73.03] 01/22/2011  . Headache [R51] 01/01/2010  . Thyroid nodule [E04.1] 05/17/2009  . Hyperlipidemia LDL goal <100 [E78.5] 02/22/2007    Past Medical History Past Medical History:  Diagnosis Date  . Anemia LIFELONG   HEAVY MENSES MAR 2012 HB 13 MCV 86.7 FERRITIN  66  . Arthritis   . Breast cancer ([place])    Right  . Breast mass in female    right  . 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 Hyperlipidemia   . Left rotator cuff tear   . Migraines   . Morbid obesity ([place])   . Personal history of chemotherapy   . Personal history of radiation therapy   . RUPTURE ROTATOR CUFF 06/08/2007   Qualifier: Diagnosis of  By: Aline Brochure MD, Dorothyann Peng    . Urticaria     Past Surgical History Past Surgical History:  Procedure Laterality Date  . BREAST BIOPSY  02/13/2011   Procedure: BREAST BIOPSY WITH NEEDLE LOCALIZATION;  Surgeon: Adin Hector, MD;  Location: [place];   Service: General;  Laterality: Right;  . BREAST LUMPECTOMY Right 2013  . BREAST SURGERY  11/1997   Left breast cancer-mastectomy  . BREAST SURGERY  2/13-   rt axilly bx  . COLONOSCOPY  2004 DR. SMITH   No polyp, hemorrhoids  . COLONOSCOPY  06/09/10   SAY:TKZSWF adenomas   . COLONOSCOPY N/A 08/19/2015   Procedure: COLONOSCOPY;  Surgeon: Danie Binder, MD;  [facility];  Service: Endoscopy;  Laterality: N/A;  130 - moved to 12:45 - Ginger notified pt  . DILATION AND CURETTAGE OF UTERUS    . DILATION AND CURETTAGE OF UTERUS  05/2002  . hystersonogram  2009  . MASTECTOMY  1999   left breast cancer   . PORT-A-CATH REMOVAL  10/20/2011   Procedure: MINOR REMOVAL PORT-A-CATH;  Surgeon: Adin Hector, MD;  Location: [place];  Service: General;  Laterality: N/A;  Porta-cath removal  left  . PORTACATH PLACEMENT  03/27/2011   Procedure: INSERTION PORT-A-CATH;  Surgeon: Adin Hector, MD;  Location: [place];  Service: General;  Laterality: Left;  insert port a cath    Family History Family History  Problem Relation  Age of Onset  . Hypertension Mother   . Cancer Father        lung cancer  . Diabetes Sister   . Cancer Brother        colon cancer  . Heart disease Sister        heart attack  . Asthma Sister   . Eczema Sister   . Allergic rhinitis Neg Hx   . Urticaria Neg Hx      Social History  reports that she has never smoked. She has never used smokeless tobacco. She reports that she drinks about 2.0 standard drinks of alcohol per week. She reports that she does not use drugs.  Medications  Current Outpatient Medications:  .  aspirin EC 81 MG tablet, Take 81 mg by mouth daily., Disp: , Rfl:  .  butalbital-acetaminophen-caffeine (FIORICET, ESGIC) 50-325-40 MG tablet, Take 1 tablet by mouth 2 (two) times daily as needed. For migraine, Disp: 14 tablet, Rfl: 3 .  Cholecalciferol (VITAMIN D PO), Take 1 Dose by mouth daily., Disp: , Rfl:   .  cyclobenzaprine (FLEXERIL) 10 MG tablet, Take 1 tablet (10 mg total) by mouth 3 (three) times daily as needed for muscle spasms., Disp: 30 tablet, Rfl: 0 .  meclizine (ANTIVERT) 25 MG tablet, Take 1 tablet (25 mg total) by mouth 3 (three) times daily as needed for dizziness., Disp: 30 tablet, Rfl: 2 .  metFORMIN (GLUCOPHAGE) 500 MG tablet, TAKE 1 TABLET BY MOUTH TWICE A DAY WITH FOOD, Disp: 180 tablet, Rfl: 1 .  Multiple Vitamin (MULITIVITAMIN WITH MINERALS) TABS, Take 1 tablet by mouth daily., Disp: , Rfl:   Allergies Nsaids and Ibuprofen  Review of Systems Review of Systems - Oncology ROS negative  Physical Exam  Vitals Wt Readings from Last 3 Encounters:  11/19/17 290 lb (131.5 kg)  11/15/17 291 lb (132 kg)  05/06/17 287 lb 1.6 oz (130.2 kg)   Temp Readings from Last 3 Encounters:  11/19/17 98.2 F (36.8 C) (Oral)  05/06/17 97.8 F (36.6 C) (Oral)  11/05/16 98.7 F (37.1 C) (Oral)   BP Readings from Last 3 Encounters:  11/19/17 (!) 151/77  11/15/17 130/62  05/06/17 131/77   Pulse Readings from Last 3 Encounters:  11/19/17 66  11/15/17 73  05/06/17 61   Constitutional: Well-developed, well-nourished, and in no distress.   HENT: Head: Normocephalic and atraumatic.  Mouth/Throat: No oropharyngeal exudate. Mucosa moist. Eyes: Pupils are equal, round, and reactive to light. Conjunctivae are normal. No scleral icterus.  Neck: Normal range of motion. Neck supple. No JVD present.  Cardiovascular: Normal rate, regular rhythm and normal heart sounds.  Exam reveals no gallop and no friction rub.   No murmur heard. Pulmonary/Chest: Effort normal and breath sounds normal. No respiratory distress. No wheezes.No rales.  Abdominal: Soft. Bowel sounds are normal. No distension. There is no tenderness. There is no guarding.  Musculoskeletal: No edema or tenderness.  Lymphadenopathy: No cervical, axillary or supraclavicular adenopathy.  Neurological: Alert and oriented to  person, place, and time. No cranial nerve deficit.  Skin: Skin is warm and dry. No rash noted. No erythema. No pallor.  Psychiatric: Affect and judgment normal.  Breast exam:  Chaperone present.  Left mastectomy.  No palpable lesions noted.  Right breast shows no dominant masses.    Labs No visits with results within 3 Day(s) from this visit.  Latest known visit with results is:  Office Visit on 11/15/2017  Component Date Value Ref Range Status  . Glucose,  Bld 11/16/2017 103* 65 - 99 mg/dL Final   Comment: .            Fasting reference interval . For someone without known diabetes, a glucose value between 100 and 125 mg/dL is consistent with prediabetes and should be confirmed with a follow-up test. .   . BUN 11/16/2017 11  7 - 25 mg/dL Final  . Creat 11/16/2017 0.66  0.50 - 1.05 mg/dL Final   Comment: For patients >19 years of age, the reference limit for Creatinine is approximately 13% higher for people identified as African-American. .   . GFR, Est Non African American 11/16/2017 98  > OR = 60 mL/min/1.66m Final  . GFR, Est African American 11/16/2017 114  > OR = 60 mL/min/1.751mFinal  . BUN/Creatinine Ratio 1016/10/9604OT APPLICABLE  6 - 22 (calc) Final  . Sodium 11/16/2017 140  135 - 146 mmol/L Final  . Potassium 11/16/2017 3.9  3.5 - 5.3 mmol/L Final  . Chloride 11/16/2017 105  98 - 110 mmol/L Final  . CO2 11/16/2017 22  20 - 32 mmol/L Final  . Calcium 11/16/2017 9.1  8.6 - 10.4 mg/dL Final  . Total Protein 11/16/2017 6.9  6.1 - 8.1 g/dL Final  . Albumin 11/16/2017 4.2  3.6 - 5.1 g/dL Final  . Globulin 11/16/2017 2.7  1.9 - 3.7 g/dL (calc) Final  . AG Ratio 11/16/2017 1.6  1.0 - 2.5 (calc) Final  . Total Bilirubin 11/16/2017 0.4  0.2 - 1.2 mg/dL Final  . Alkaline phosphatase (APISO) 11/16/2017 61  33 - 130 U/L Final  . AST 11/16/2017 19  10 - 35 U/L Final  . ALT 11/16/2017 21  6 - 29 U/L Final  . Hgb A1c MFr Bld 11/16/2017 6.0* <5.7 % of total Hgb Final   Comment:  For someone without known diabetes, a hemoglobin  A1c value between 5.7% and 6.4% is consistent with prediabetes and should be confirmed with a  follow-up test. . For someone with known diabetes, a value <7% indicates that their diabetes is well controlled. A1c targets should be individualized based on duration of diabetes, age, comorbid conditions, and other considerations. . This assay result is consistent with an increased risk of diabetes. . Currently, no consensus exists regarding use of hemoglobin A1c for diagnosis of diabetes for children. .   . Mean Plasma Glucose 11/16/2017 126  (calc) Final  . eAG (mmol/L) 11/16/2017 7.0  (calc) Final  . Cholesterol 11/16/2017 216* <200 mg/dL Final  . HDL 11/16/2017 55  >50 mg/dL Final  . Triglycerides 11/16/2017 149  <150 mg/dL Final  . LDL Cholesterol (Calc) 11/16/2017 133* mg/dL (calc) Final   Comment: Reference range: <100 . Desirable range <100 mg/dL for primary prevention;   <70 mg/dL for patients with CHD or diabetic patients  with > or = 2 CHD risk factors. . Marland KitchenDL-C is now calculated using the Martin-Hopkins  calculation, which is a validated novel method providing  better accuracy than the Friedewald equation in the  estimation of LDL-C.  MaCresenciano Genret al. JAAnnamaria [id number];811(91 [id number]  (http://education.[employer].com/faq/FAQ164)   . Total CHOL/HDL Ratio 11/16/2017 3.9  <5.0 (calc) Final  . Non-HDL Cholesterol (Calc) 11/16/2017 161* <130 mg/dL (calc) Final   Comment: For patients with diabetes plus 1 major ASCVD risk  factor, treating to a non-HDL-C goal of <100 mg/dL  (LDL-C of <70 mg/dL) is considered a therapeutic  option.      Pathology Orders Placed This Encounter  Procedures  .  MR Breast Bilateral Wo Contrast    Standing Status:   Future    Standing Expiration Date:   11/19/2018    Order Specific Question:   ** REASON FOR EXAM (FREE TEXT)    Answer:   BRCA1 +    Order Specific Question:   What is the  patient's sedation requirement?    Answer:   No Sedation    Order Specific Question:   Does the patient have a pacemaker or implanted devices?    Answer:   No    Order Specific Question:   Preferred imaging location?    Answer:   [facility] (table limit-350lbs)    Order Specific Question:   Radiology Contrast Protocol - do NOT remove file path    Answer:   [email or web address]  . CBC with Differential/Platelet    Standing Status:   Future    Standing Expiration Date:   11/20/2019  . Comprehensive metabolic panel    Standing Status:   Future    Standing Expiration Date:   11/20/2019  . Lactate dehydrogenase    Standing Status:   Future    Standing Expiration Date:   11/20/2019       Zoila Shutter MD

## 2017-11-22 ENCOUNTER — Ambulatory Visit (HOSPITAL_COMMUNITY): Payer: Self-pay

## 2017-11-28 ENCOUNTER — Encounter: Payer: Self-pay | Admitting: Family Medicine

## 2017-11-28 DIAGNOSIS — M65312 Trigger thumb, left thumb: Secondary | ICD-10-CM | POA: Insufficient documentation

## 2017-11-28 NOTE — Assessment & Plan Note (Signed)
Controlled, no change in medication  

## 2017-11-28 NOTE — Assessment & Plan Note (Signed)
Deteriorated. Patient re-educated about  the importance of commitment to a  minimum of 150 minutes of exercise per week.  The importance of healthy food choices with portion control discussed. Encouraged to start a food diary, count calories and to consider  joining a support group. Sample diet sheets offered. Goals set by the patient for the next several months.   Weight /BMI 11/19/2017 11/15/2017 05/06/2017  WEIGHT 290 lb 291 lb 287 lb 1.6 oz  HEIGHT - 5\' 5"  5\' 4"   BMI 48.26 kg/m2 48.42 kg/m2 49.28 kg/m2

## 2017-11-28 NOTE — Assessment & Plan Note (Signed)
Patient educated about the importance of limiting  Carbohydrate intake , the need to commit to daily physical activity for a minimum of 30 minutes , and to commit weight loss. The fact that changes in all these areas will reduce or eliminate all together the development of diabetes is stressed.  UnchANGED, CONTINUE METFORMINDAILY  Diabetic Labs Latest Ref Rng & Units 11/16/2017 05/25/2017 01/28/2017 06/30/2016 01/28/2016  HbA1c <5.7 % of total Hgb 6.0(H) - 6.0(H) 5.9(H) 5.8(H)  Chol <200 mg/dL 216(H) - 207(H) 197 219(H)  HDL >50 mg/dL 55 - 55 53 54  Calc LDL mg/dL (calc) 133(H) - 127(H) 107(H) 139(H)  Triglycerides <150 mg/dL 149 - 134 187(H) 131  Creatinine 0.50 - 1.05 mg/dL 0.66 0.80 0.65 - 0.80   BP/Weight 11/19/2017 11/15/2017 05/06/2017 05/05/2017 01/04/2017 11/05/2016 1/74/0814  Systolic BP 481 856 314 970 263 785 885  Diastolic BP 77 62 77 82 70 65 88  Wt. (Lbs) 290 291 287.1 288 293 291.3 293  BMI 48.26 48.42 49.28 46.48 47.29 47.02 47.29   No flowsheet data found.

## 2017-11-28 NOTE — Assessment & Plan Note (Signed)
OBSERVATION ONLY AT THIS TIME

## 2017-11-28 NOTE — Assessment & Plan Note (Signed)
Hyperlipidemia:Low fat diet discussed and encouraged.   Lipid Panel  Lab Results  Component Value Date   CHOL 216 (H) 11/16/2017   HDL 55 11/16/2017   LDLCALC 133 (H) 11/16/2017   TRIG 149 11/16/2017   CHOLHDL 3.9 11/16/2017   Needs to reduce fried and fatty foods

## 2017-11-30 ENCOUNTER — Encounter (HOSPITAL_COMMUNITY): Payer: Self-pay

## 2017-12-07 ENCOUNTER — Encounter (HOSPITAL_COMMUNITY): Payer: Self-pay

## 2017-12-15 ENCOUNTER — Ambulatory Visit (HOSPITAL_COMMUNITY)
Admission: RE | Admit: 2017-12-15 | Discharge: 2017-12-15 | Disposition: A | Discharge status: Home / Self Care | Payer: BLUE CROSS/BLUE SHIELD | Source: Ambulatory Visit | Attending: Family Medicine | Admitting: Family Medicine

## 2017-12-15 DIAGNOSIS — Z1231 Encounter for screening mammogram for malignant neoplasm of breast: Secondary | ICD-10-CM | POA: Diagnosis not present

## 2017-12-19 ENCOUNTER — Other Ambulatory Visit: Payer: Self-pay | Admitting: Family Medicine

## 2018-04-18 ENCOUNTER — Encounter: Payer: Self-pay | Admitting: Family Medicine

## 2018-04-18 ENCOUNTER — Other Ambulatory Visit: Payer: Self-pay

## 2018-04-18 ENCOUNTER — Ambulatory Visit (INDEPENDENT_AMBULATORY_CARE_PROVIDER_SITE_OTHER): Payer: PRIVATE HEALTH INSURANCE | Admitting: Family Medicine

## 2018-04-18 VITALS — BP 118/60 | HR 70 | Temp 97.9°F | Resp 14 | Ht 63.0 in | Wt 288.0 lb

## 2018-04-18 DIAGNOSIS — J209 Acute bronchitis, unspecified: Secondary | ICD-10-CM

## 2018-04-18 DIAGNOSIS — E785 Hyperlipidemia, unspecified: Secondary | ICD-10-CM | POA: Diagnosis not present

## 2018-04-18 DIAGNOSIS — E8881 Metabolic syndrome: Secondary | ICD-10-CM

## 2018-04-18 DIAGNOSIS — R7303 Prediabetes: Secondary | ICD-10-CM | POA: Diagnosis not present

## 2018-04-18 MED ORDER — BUTALBITAL-APAP-CAFFEINE 50-325-40 MG PO TABS: ORAL_TABLET | ORAL | 2 refills | Status: DC

## 2018-04-18 MED ORDER — BENZONATATE 100 MG PO CAPS: 100.0000 mg | ORAL_CAPSULE | Freq: Two times a day (BID) | ORAL | 0 refills | Status: DC | PRN

## 2018-04-18 MED ORDER — PENICILLIN V POTASSIUM 500 MG PO TABS: 500.0000 mg | ORAL_TABLET | Freq: Three times a day (TID) | ORAL | 0 refills | Status: DC

## 2018-04-18 NOTE — Assessment & Plan Note (Signed)
1 week h/o cough and fever, pen v , tessalon perle and phenergan dm  CXR ordered

## 2018-04-18 NOTE — Assessment & Plan Note (Signed)
Hyperlipidemia:Low fat diet discussed and encouraged.   Lipid Panel  Lab Results  Component Value Date   CHOL 216 (H) 11/16/2017   HDL 55 11/16/2017   LDLCALC 133 (H) 11/16/2017   TRIG 149 11/16/2017   CHOLHDL 3.9 11/16/2017     Updated lab needed at/ before next visit.

## 2018-04-18 NOTE — Patient Instructions (Addendum)
F/u in 6 months, call if you need membefore  You are treated for acute bronchitis, please get CXR asap Penicillin is prescribed for 10 days need this  Nurse please document temperature and provide work excuse   Decongestant , tessalon perles is also prescribed, you may substitue with Robitussin  DM   Work excuse from 3/10 to return 04/25/2018  Labs as soon as possible, CBC, fasting lipid, cmp, HBA1C, chem 7 and EGFr  It is important that you exercise regularly at least 30 minutes 5 times a week. If you develop chest pain, have severe difficulty breathing, or feel very tired, stop exercising immediately and seek medical attention  Think about what you will eat, plan ahead. Choose " clean, green, fresh or frozen" over canned, processed or packaged foods which are more sugary, salty and fatty. 70 to 75% of food eaten should be vegetables and fruit. Three meals at set times with snacks allowed between meals, but they must be fruit or vegetables. Aim to eat over a 12 hour period , example 7 am to 7 pm, and STOP after  your last meal of the day. Drink water,generally about 64 ounces per day, no other drink is as healthy. Fruit juice is best enjoyed in a healthy way, by EATING the fruit.

## 2018-04-18 NOTE — Assessment & Plan Note (Signed)
The increased risk of cardiovascular disease associated with this diagnosis, and the need to consistently work on lifestyle to change this is discussed. Following  a  heart healthy diet ,commitment to 30 minutes of exercise at least 5 days per week, as well as control of blood sugar and cholesterol , and achieving a healthy weight are all the areas to be addressed .  

## 2018-04-18 NOTE — Progress Notes (Signed)
Kathleen Reynolds     MRN: [id number]      DOB: 12/29/1960   HPI Kathleen Reynolds is here for follow up and re-evaluation of chronic medical conditions, medication management and review of any available recent lab and radiology data.  Preventive health is updated, specifically  Cancer screening and Immunization.   Questions or concerns regarding consultations or procedures which the PT has had in the interim are  addressed. The PT denies any adverse reactions to current medications since the last visit.  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 No travel history and no contact with anyone traveling. Started illness on 03/10, she did work that day, she took her temp last Tuesday night was 100.3, and the following day was 103 OTC medications , multiple with not much improvement States last Wednesday after a Bm,. Had lad loud chiming noise in her head, never  before   ROS . Denies sinus pressure, nasal congestion, ear pain or sore throat. Denies chest pains, palpitations and leg swelling Denies abdominal pain, nausea, vomiting,diarrhea or constipation.   Denies dysuria, frequency, hesitancy or incontinence. Chronic  joint pain, swelling and limitation in mobility. Denies headaches, seizures, numbness, or tingling. Denies depression, anxiety or insomnia. Denies skin break down or rash.   PE  LMP 03/27/2011   Patient alert and oriented and in no cardiopulmonary distress.  HEENT: No facial asymmetry, EOMI,   oropharynx pink and moist.  Neck supple no JVD, no mass.  Chest:decreased air entry, bilateral crackles , no wheezes  CVS: S1, S2 no murmurs, no S3.Regular rate.  ABD: Soft non tender.   Ext: No edema  MS: Adequate ROM spine, shoulders, hips and reduced in  knees.  Skin: Intact, no ulcerations or rash noted.  Psych: Good eye contact, normal affect. Memory intact not anxious or depressed appearing.  CNS: CN 2-12  intact, power,  normal throughout.no focal deficits noted.   Assessment & Plan  Acute bronchitis  1 week h/o cough and fever, pen v , tessalon perle and phenergan dm  CXR ordered  Metabolic syndrome X The increased risk of cardiovascular disease associated with this diagnosis, and the need to consistently work on lifestyle to change this is discussed. Following  a  heart healthy diet ,commitment to 30 minutes of exercise at least 5 days per week, as well as control of blood sugar and cholesterol , and achieving a healthy weight are all the areas to be addressed .   Prediabetes Patient educated about the importance of limiting  Carbohydrate intake , the need to commit to daily physical activity for a minimum of 30 minutes , and to commit weight loss. The fact that changes in all these areas will reduce or eliminate all together the development of diabetes is stressed.   Diabetic Labs Latest Ref Rng & Units 11/16/2017 05/25/2017 01/28/2017 06/30/2016 01/28/2016  HbA1c <5.7 % of total Hgb 6.0(H) - 6.0(H) 5.9(H) 5.8(H)  Chol <200 mg/dL 216(H) - 207(H) 197 219(H)  HDL >50 mg/dL 55 - 55 53 54  Calc LDL mg/dL (calc) 133(H) - 127(H) 107(H) 139(H)  Triglycerides <150 mg/dL 149 - 134 187(H) 131  Creatinine 0.50 - 1.05 mg/dL 0.66 0.80 0.65 - 0.80   BP/Weight 04/18/2018 11/19/2017 11/15/2017 05/06/2017 05/05/2017 01/04/2017 25/09/5275  Systolic BP 824 235 361 443 154 008 676  Diastolic BP 60 77 62 77 82 70 65  Wt. (Lbs) 288 290 291 287.1 288  293 291.3  BMI 51.02 48.26 48.42 49.28 46.48 47.29 47.02   No flowsheet data found.  Updated lab needed at/ before next visit.   Hyperlipidemia LDL goal <100 Hyperlipidemia:Low fat diet discussed and encouraged.   Lipid Panel  Lab Results  Component Value Date   CHOL 216 (H) 11/16/2017   HDL 55 11/16/2017   LDLCALC 133 (H) 11/16/2017   TRIG 149 11/16/2017   CHOLHDL 3.9 11/16/2017     Updated lab needed at/ before next visit.

## 2018-04-18 NOTE — Assessment & Plan Note (Signed)
Patient educated about the importance of limiting  Carbohydrate intake , the need to commit to daily physical activity for a minimum of 30 minutes , and to commit weight loss. The fact that changes in all these areas will reduce or eliminate all together the development of diabetes is stressed.   Diabetic Labs Latest Ref Rng & Units 11/16/2017 05/25/2017 01/28/2017 06/30/2016 01/28/2016  HbA1c <5.7 % of total Hgb 6.0(H) - 6.0(H) 5.9(H) 5.8(H)  Chol <200 mg/dL 216(H) - 207(H) 197 219(H)  HDL >50 mg/dL 55 - 55 53 54  Calc LDL mg/dL (calc) 133(H) - 127(H) 107(H) 139(H)  Triglycerides <150 mg/dL 149 - 134 187(H) 131  Creatinine 0.50 - 1.05 mg/dL 0.66 0.80 0.65 - 0.80   BP/Weight 04/18/2018 11/19/2017 11/15/2017 05/06/2017 05/05/2017 01/04/2017 93/06/5215  Systolic BP 471 595 396 728 979 150 413  Diastolic BP 60 77 62 77 82 70 65  Wt. (Lbs) 288 290 291 287.1 288 293 291.3  BMI 51.02 48.26 48.42 49.28 46.48 47.29 47.02   No flowsheet data found.  Updated lab needed at/ before next visit.

## 2018-04-19 ENCOUNTER — Ambulatory Visit (HOSPITAL_COMMUNITY)
Admission: RE | Admit: 2018-04-19 | Discharge: 2018-04-19 | Disposition: A | Discharge status: Home / Self Care | Payer: PRIVATE HEALTH INSURANCE | Source: Ambulatory Visit | Attending: Family Medicine | Admitting: Family Medicine

## 2018-04-19 DIAGNOSIS — J209 Acute bronchitis, unspecified: Secondary | ICD-10-CM | POA: Diagnosis present

## 2018-04-24 ENCOUNTER — Encounter: Payer: Self-pay | Admitting: Family Medicine

## 2018-04-24 NOTE — Assessment & Plan Note (Signed)
Unchanged.  Patient re-educated about  the importance of commitment to a  minimum of 150 minutes of exercise per week as able.  The importance of healthy food choices with portion control discussed, as well as eating regularly and within a 12 hour window most days. The need to choose "clean , green" food 50 to 75% of the time is discussed, as well as to make water the primary drink and set a goal of 64 ounces water daily.  Encouraged to start a food diary,  and to consider  joining a support group. Sample diet sheets offered. Goals set by the patient for the next several months.   Weight /BMI 04/18/2018 11/19/2017 11/15/2017  WEIGHT 288 lb 290 lb 291 lb  HEIGHT 5\' 3"  - 5\' 5"   BMI 51.02 kg/m2 48.26 kg/m2 48.42 kg/m2

## 2018-05-09 ENCOUNTER — Telehealth: Payer: Self-pay | Admitting: *Deleted

## 2018-05-09 NOTE — Telephone Encounter (Signed)
[employer] form received via fax Copied  Noted Sleeved

## 2018-05-16 ENCOUNTER — Other Ambulatory Visit (HOSPITAL_COMMUNITY): Payer: Self-pay

## 2018-05-23 ENCOUNTER — Ambulatory Visit (HOSPITAL_COMMUNITY): Payer: Self-pay | Admitting: Hematology

## 2018-05-23 ENCOUNTER — Other Ambulatory Visit (HOSPITAL_COMMUNITY): Payer: Self-pay

## 2018-05-23 DIAGNOSIS — Z853 Personal history of malignant neoplasm of breast: Secondary | ICD-10-CM

## 2018-05-23 DIAGNOSIS — C50911 Malignant neoplasm of unspecified site of right female breast: Secondary | ICD-10-CM

## 2018-05-23 DIAGNOSIS — Z1501 Genetic susceptibility to malignant neoplasm of breast: Secondary | ICD-10-CM

## 2018-05-23 DIAGNOSIS — Z1509 Genetic susceptibility to other malignant neoplasm: Secondary | ICD-10-CM

## 2018-05-24 ENCOUNTER — Other Ambulatory Visit: Payer: Self-pay

## 2018-05-24 ENCOUNTER — Inpatient Hospital Stay (HOSPITAL_COMMUNITY): Payer: PRIVATE HEALTH INSURANCE | Attending: Hematology

## 2018-05-24 DIAGNOSIS — Z171 Estrogen receptor negative status [ER-]: Secondary | ICD-10-CM | POA: Diagnosis not present

## 2018-05-24 DIAGNOSIS — Z853 Personal history of malignant neoplasm of breast: Secondary | ICD-10-CM

## 2018-05-24 DIAGNOSIS — Z9012 Acquired absence of left breast and nipple: Secondary | ICD-10-CM | POA: Insufficient documentation

## 2018-05-24 DIAGNOSIS — Z79899 Other long term (current) drug therapy: Secondary | ICD-10-CM | POA: Diagnosis not present

## 2018-05-24 DIAGNOSIS — Z9221 Personal history of antineoplastic chemotherapy: Secondary | ICD-10-CM | POA: Diagnosis not present

## 2018-05-24 DIAGNOSIS — C50911 Malignant neoplasm of unspecified site of right female breast: Secondary | ICD-10-CM

## 2018-05-24 DIAGNOSIS — Z923 Personal history of irradiation: Secondary | ICD-10-CM | POA: Insufficient documentation

## 2018-05-24 DIAGNOSIS — Z1509 Genetic susceptibility to other malignant neoplasm: Secondary | ICD-10-CM

## 2018-05-24 DIAGNOSIS — Z1501 Genetic susceptibility to malignant neoplasm of breast: Secondary | ICD-10-CM

## 2018-05-24 LAB — COMPREHENSIVE METABOLIC PANEL
ALT: 21 U/L (ref 0–44)
AST: 19 U/L (ref 15–41)
Albumin: 4.1 g/dL (ref 3.5–5.0)
Alkaline Phosphatase: 61 U/L (ref 38–126)
Anion gap: 9 (ref 5–15)
BUN: 18 mg/dL (ref 6–20)
CO2: 22 mmol/L (ref 22–32)
Calcium: 9.1 mg/dL (ref 8.9–10.3)
Chloride: 108 mmol/L (ref 98–111)
Creatinine, Ser: 0.78 mg/dL (ref 0.44–1.00)
GFR calc Af Amer: >60 mL/min (ref >60)
GFR calc non Af Amer: >60 mL/min (ref >60)
Glucose, Bld: 104 mg/dL — ABNORMAL HIGH (ref 70–99)
Potassium: 3.8 mmol/L (ref 3.5–5.1)
Sodium: 139 mmol/L (ref 135–145)
Total Bilirubin: 0.6 mg/dL (ref 0.3–1.2)
Total Protein: 7.3 g/dL (ref 6.5–8.1)

## 2018-05-24 LAB — CBC WITH DIFFERENTIAL/PLATELET
Abs Immature Granulocytes: 0.01 10*3/uL (ref 0.00–0.07)
Basophils Absolute: 0 10*3/uL (ref 0.0–0.1)
Basophils Relative: 0 %
Eosinophils Absolute: 0 10*3/uL (ref 0.0–0.5)
Eosinophils Relative: 1 %
HCT: 40.9 % (ref 36.0–46.0)
Hemoglobin: 13.3 g/dL (ref 12.0–15.0)
Immature Granulocytes: 0 %
Lymphocytes Relative: 36 %
Lymphs Abs: 2 10*3/uL (ref 0.7–4.0)
MCH: 30.5 pg (ref 26.0–34.0)
MCHC: 32.5 g/dL (ref 30.0–36.0)
MCV: 93.8 fL (ref 80.0–100.0)
Monocytes Absolute: 0.3 10*3/uL (ref 0.1–1.0)
Monocytes Relative: 6 %
Neutro Abs: 3.1 10*3/uL (ref 1.7–7.7)
Neutrophils Relative %: 57 %
Platelets: 203 10*3/uL (ref 150–400)
RBC: 4.36 MIL/uL (ref 3.87–5.11)
RDW: 14.9 % (ref 11.5–15.5)
WBC: 5.4 10*3/uL (ref 4.0–10.5)
nRBC: 0 % (ref 0.0–0.2)

## 2018-05-24 LAB — LACTATE DEHYDROGENASE: LDH: 165 U/L (ref 98–192)

## 2018-05-30 ENCOUNTER — Other Ambulatory Visit: Payer: Self-pay

## 2018-05-31 ENCOUNTER — Inpatient Hospital Stay (HOSPITAL_BASED_OUTPATIENT_CLINIC_OR_DEPARTMENT_OTHER): Payer: PRIVATE HEALTH INSURANCE | Admitting: Nurse Practitioner

## 2018-05-31 VITALS — BP 137/75 | HR 64 | Temp 98.0°F | Resp 18 | Wt 288.6 lb

## 2018-05-31 DIAGNOSIS — C50911 Malignant neoplasm of unspecified site of right female breast: Secondary | ICD-10-CM

## 2018-05-31 DIAGNOSIS — Z9012 Acquired absence of left breast and nipple: Secondary | ICD-10-CM | POA: Diagnosis not present

## 2018-05-31 DIAGNOSIS — C50912 Malignant neoplasm of unspecified site of left female breast: Secondary | ICD-10-CM

## 2018-05-31 DIAGNOSIS — Z79899 Other long term (current) drug therapy: Secondary | ICD-10-CM

## 2018-05-31 DIAGNOSIS — Z853 Personal history of malignant neoplasm of breast: Secondary | ICD-10-CM

## 2018-05-31 DIAGNOSIS — Z171 Estrogen receptor negative status [ER-]: Secondary | ICD-10-CM | POA: Diagnosis not present

## 2018-05-31 DIAGNOSIS — Z9221 Personal history of antineoplastic chemotherapy: Secondary | ICD-10-CM

## 2018-05-31 DIAGNOSIS — Z923 Personal history of irradiation: Secondary | ICD-10-CM

## 2018-05-31 DIAGNOSIS — Z13820 Encounter for screening for osteoporosis: Secondary | ICD-10-CM

## 2018-05-31 DIAGNOSIS — Z1231 Encounter for screening mammogram for malignant neoplasm of breast: Secondary | ICD-10-CM

## 2018-05-31 NOTE — Progress Notes (Signed)
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 Jamerson 58 y.o. 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  Patient has been doing well since her last visit.  She does have some numbness and tingling in her fingertips due to the chemo.  She has no other complaints at this time. Denies any nausea, vomiting, or diarrhea. Denies any new pains. Had not noticed any recent bleeding such as epistaxis, hematuria or hematochezia. Denies recent chest pain on exertion, shortness of breath on minimal exertion, pre-syncopal episodes, or palpitations. Denies any recent fevers, infections, or recent hospitalizations. Patient reports appetite at 100% and energy level at 25%.  She is eating well and maintaining her weight at this time.   REVIEW OF SYSTEMS:  Review of Systems  Constitutional: Positive for fatigue.  Neurological: Positive for numbness (fingertips).  All other systems reviewed and are negative.    PAST MEDICAL/SURGICAL HISTORY:  Past Medical History:  Diagnosis Date  . Anemia LIFELONG   HEAVY MENSES MAR 2012 HB 13 MCV 86.7 FERRITIN  66  . Arthritis   . Breast cancer ([facility])    Right  . Breast mass in female    right  . 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 Hyperlipidemia   . Left rotator cuff tear   . Migraines   . Morbid obesity ([facility])   . Personal history of chemotherapy   . Personal history of radiation therapy   . RUPTURE ROTATOR CUFF 06/08/2007   Qualifier:  Diagnosis of  By: Aline Brochure MD, Dorothyann Peng    . Urticaria    Past Surgical History:  Procedure Laterality Date  . BREAST BIOPSY  02/13/2011   Procedure: BREAST BIOPSY WITH NEEDLE LOCALIZATION;  Surgeon: Adin Hector, MD;  Location: [facility];  Service: General;  Laterality: Right;  . BREAST LUMPECTOMY Right 2013  . BREAST SURGERY  11/1997   Left breast cancer-mastectomy  . BREAST SURGERY  2/13-   rt axilly bx  . COLONOSCOPY  2004 DR. SMITH   No polyp, hemorrhoids  . COLONOSCOPY  06/09/10   [id number] adenomas   . COLONOSCOPY N/A 08/19/2015   Procedure: COLONOSCOPY;  Surgeon: Danie Binder, MD;  [facility];  Service: Endoscopy;  Laterality: N/A;  130 - moved to 12:45 - Ginger notified pt  . DILATION AND CURETTAGE OF UTERUS    . DILATION AND CURETTAGE OF UTERUS  05/2002  . hystersonogram  2009  . MASTECTOMY  1999   left breast cancer   . PORT-A-CATH REMOVAL  10/20/2011   Procedure: MINOR REMOVAL PORT-A-CATH;  Surgeon: Adin Hector, MD;  Location: [facility];  Service: General;  Laterality: N/A;  Porta-cath removal  left  . PORTACATH PLACEMENT  03/27/2011   Procedure: INSERTION PORT-A-CATH;  Surgeon: Adin Hector, MD;  Location: [facility];  Service: General;  Laterality: Left;  insert port a cath     SOCIAL HISTORY:  Social History   Socioeconomic History  .  Marital status: Single    Spouse name: Not on file  . Number of children: Not on file  . Years of education: Not on file  . Highest education level: Not on file  Occupational History  . Not on file  Social Needs  . Financial resource strain: Not on file  . Food insecurity:    Worry: Not on file    Inability: Not on file  . Transportation needs:    Medical: Not on file    Non-medical: Not on file  Tobacco Use  . Smoking status: Never Smoker  . Smokeless tobacco: Never Used  Substance and Sexual Activity  . Alcohol use: Yes    Alcohol/week: 2.0 standard  drinks    Types: 2 drink(s) per week    Comment: occasional  . Drug use: No  . Sexual activity: Yes    Birth control/protection: None  Lifestyle  . Physical activity:    Days per week: Not on file    Minutes per session: Not on file  . Stress: Not on file  Relationships  . Social connections:    Talks on phone: Not on file    Gets together: Not on file    Attends religious service: Not on file    Active member of club or organization: Not on file    Attends meetings of clubs or organizations: Not on file    Relationship status: Not on file  . Intimate partner violence:    Fear of current or ex partner: Not on file    Emotionally abused: Not on file    Physically abused: Not on file    Forced sexual activity: Not on file  Other Topics Concern  . Not on file  Social History Narrative   Single, 1 kid-58 yo female-lives with mom   Employed as a CNA   Rare Etoh.   No tobacco products    FAMILY HISTORY:  Family History  Problem Relation Age of Onset  . Hypertension Mother   . Cancer Father        lung cancer  . Diabetes Sister   . Cancer Brother        colon cancer  . Heart disease Sister        heart attack  . Asthma Sister   . Eczema Sister   . Allergic rhinitis Neg Hx   . Urticaria Neg Hx     CURRENT MEDICATIONS:  Outpatient Encounter Medications as of 05/31/2018  Medication Sig  . benzonatate (TESSALON) 100 MG capsule Take 1 capsule (100 mg total) by mouth 2 (two) times daily as needed for cough.  . butalbital-acetaminophen-caffeine (FIORICET, ESGIC) 50-325-40 MG tablet Take one tablet two times daily , as needed, for headache  . Cholecalciferol (VITAMIN D PO) Take 1 Dose by mouth daily.  . meclizine (ANTIVERT) 25 MG tablet Take 1 tablet (25 mg total) by mouth 3 (three) times daily as needed for dizziness.  . metFORMIN (GLUCOPHAGE) 500 MG tablet TAKE 1 TABLET BY MOUTH TWICE A DAY WITH FOOD  . Multiple Vitamin (MULITIVITAMIN WITH MINERALS) TABS Take 1 tablet by  mouth daily.  . penicillin v potassium (VEETID) 500 MG tablet Take 1 tablet (500 mg total) by mouth 3 (three) times daily. (Patient not taking: Reported on 05/31/2018)   No facility-administered encounter medications on file as of 05/31/2018.     ALLERGIES:  Allergies  Allergen Reactions  . Nsaids Anaphylaxis    Ibuprofen required hospitalization in 2016 due to ibuprofen  .  Ibuprofen Itching     PHYSICAL EXAM:  ECOG Performance status: 1  Vitals:   05/31/18 1054  BP: 137/75  Pulse: 64  Resp: 18  Temp: 98 F (36.7 C)  SpO2: 98%   Filed Weights   05/31/18 1054  Weight: 288 lb 9.6 oz (130.9 kg)    Physical Exam Constitutional:      Appearance: Normal appearance. She is normal weight.  Cardiovascular:     Rate and Rhythm: Normal rate and regular rhythm.     Heart sounds: Normal heart sounds.  Pulmonary:     Effort: Pulmonary effort is normal.     Breath sounds: Normal breath sounds.  Abdominal:     General: Bowel sounds are normal.     Palpations: Abdomen is soft.  Musculoskeletal: Normal range of motion.  Skin:    General: Skin is warm and dry.  Neurological:     Mental Status: She is alert and oriented to person, place, and time. Mental status is at baseline.  Psychiatric:        Mood and Affect: Mood normal.        Behavior: Behavior normal.        Thought Content: Thought content normal.        Judgment: Judgment normal.   Breast: LEFT: Left mastectomy site within normal limits.  No adenopathy.  No skin changes.  No edema or redness. RIGHT:No palpable masses, no skin changes or nipple discharge, no adenopathy.   LABORATORY DATA:  I have reviewed the labs as listed.  CBC    Component Value Date/Time   WBC 5.4 05/24/2018 0927   RBC 4.36 05/24/2018 0927   HGB 13.3 05/24/2018 0927   HCT 40.9 05/24/2018 0927   PLT 203 05/24/2018 0927   MCV 93.8 05/24/2018 0927   MCH 30.5 05/24/2018 0927   MCHC 32.5 05/24/2018 0927   RDW 14.9 05/24/2018 0927    LYMPHSABS 2.0 05/24/2018 0927   MONOABS 0.3 05/24/2018 0927   EOSABS 0.0 05/24/2018 0927   BASOSABS 0.0 05/24/2018 0927   CMP Latest Ref Rng & Units 05/24/2018 11/16/2017 05/25/2017  Glucose 70 - 99 mg/dL 104(H) 103(H) -  BUN 6 - 20 mg/dL 18 11 -  Creatinine 0.44 - 1.00 mg/dL 0.78 0.66 0.80  Sodium 135 - 145 mmol/L 139 140 -  Potassium 3.5 - 5.1 mmol/L 3.8 3.9 -  Chloride 98 - 111 mmol/L 108 105 -  CO2 22 - 32 mmol/L 22 22 -  Calcium 8.9 - 10.3 mg/dL 9.1 9.1 -  Total Protein 6.5 - 8.1 g/dL 7.3 6.9 -  Total Bilirubin 0.3 - 1.2 mg/dL 0.6 0.4 -  Alkaline Phos 38 - 126 U/L 61 - -  AST 15 - 41 U/L 19 19 -  ALT 0 - 44 U/L 21 21 -       DIAGNOSTIC IMAGING:  I have independently reviewed the mammogram scan and discussed with the patient.  I personally performed a face-to-face visit.  All questions were answered to patient's stated satisfaction. Encouraged patient to call with any new concerns or questions before his next visit to the cancer center and we can certain see him sooner, if needed.     ASSESSMENT & PLAN:   Breast cancer, left breast ([facility]) 1.  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 - Patient had a left mastectomy on 11/27/1997.  Showed a 2.3 cm primary mass with 7-  lymph nodes.  BRCA 1 positive. -She was treated with AC x4 cycles followed by Taxol with 4 cycles. - Patient was diagnosed with right-sided triple negative breast cancer in 2013. - Dr. Fanny Skates performed a right lumpectomy with sentinel node biopsy on 02/13/2011.  It showed triple negative breast cancer with negative lymph nodes.  Her Ki-67 marker was 54%. -A right mastectomy was recommended at this time but she chose to have the lumpectomy instead. - She has now status post 6 cycles of carboplatin and Taxotere and status post radiation. - Her last mammogram on 12/15/2017 showed B RADS category 1 negative. - We will see  her back at the end of November with a repeat yearly mammogram and labs.  2.  Bone health: - We have set her up for a DEXA scan at the end of November with her follow-up appointment. -She is taking calcium and vitamin D daily. -She tries to get exercise at least 3 times a week.      Orders placed this encounter:  Orders Placed This Encounter  Procedures  . DG Bone Density  . MM 3D SCREEN BREAST UNI RIGHT  . Lactate dehydrogenase  . CBC with Differential/Platelet  . Comprehensive metabolic panel  . Vitamin B12  . VITAMIN D 25 Hydroxy (Vit-D Deficiency, Fractures)  . Folate      Francene Finders, FNP-C [facility] [phone]

## 2018-05-31 NOTE — Assessment & Plan Note (Signed)
1.  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 - Patient had a left mastectomy on 11/27/1997.  Showed a 2.3 cm primary mass with 7- lymph nodes.  BRCA 1 positive. -She was treated with AC x4 cycles followed by Taxol with 4 cycles. - Patient was diagnosed with right-sided triple negative breast cancer in 2013. - Dr. Fanny Skates performed a right lumpectomy with sentinel node biopsy on 02/13/2011.  It showed triple negative breast cancer with negative lymph nodes.  Her Ki-67 marker was 54%. -A right mastectomy was recommended at this time but she chose to have the lumpectomy instead. - She has now status post 6 cycles of carboplatin and Taxotere and status post radiation. - Her last mammogram on 12/15/2017 showed B RADS category 1 negative. - We will see her back at the end of November with a repeat yearly mammogram and labs.  2.  Bone health: - We have set her up for a DEXA scan at the end of November with her follow-up appointment. -She is taking calcium and vitamin D daily. -She tries to get exercise at least 3 times a week.

## 2018-05-31 NOTE — Patient Instructions (Signed)
[facility] at [facility] Discharge Instructions  Follow-up in November after her mammogram and DEXA scan with labs.   Thank you for choosing [facility] at [facility] to provide your oncology and hematology care.  To afford each patient quality time with our provider, please arrive at least 15 minutes before your scheduled appointment time.   If you have a lab appointment with the [facility] please come in thru the  Main Entrance and check in at the main information desk  You need to re-schedule your appointment should you arrive 10 or more minutes late.  We strive to give you quality time with our providers, and arriving late affects you and other patients whose appointments are after yours.  Also, if you no show three or more times for appointments you may be dismissed from the clinic at the providers discretion.     Again, thank you for choosing [facility].  Our hope is that these requests will decrease the amount of time that you wait before being seen by our physicians.       _____________________________________________________________  Should you have questions after your visit to [facility], please contact our office at [phone] between the hours of 8:00 a.m. and 4:30 p.m.  Voicemails left after 4:00 p.m. will not be returned until the following business day.  For prescription refill requests, have your pharmacy contact our office and allow 72 hours.    Cancer Center Support Programs:   > Cancer Support Group  2nd Tuesday of the month 1pm-2pm, Journey Room

## 2018-06-08 LAB — BASIC METABOLIC PANEL WITH GFR
BUN: 17 mg/dL (ref 7–25)
CO2: 24 mmol/L (ref 20–32)
Calcium: 9.3 mg/dL (ref 8.6–10.4)
Chloride: 106 mmol/L (ref 98–110)
Creat: 0.74 mg/dL (ref 0.50–1.05)
GFR, Est African American: 104 mL/min/{1.73_m2} (ref >=60)
GFR, Est Non African American: 90 mL/min/{1.73_m2} (ref >=60)
GLUCOSE: 102 mg/dL — AB (ref 65–99)
Potassium: 4 mmol/L (ref 3.5–5.3)
Sodium: 139 mmol/L (ref 135–146)

## 2018-06-08 LAB — CBC
HCT: 38.7 % (ref 35.0–45.0)
Hemoglobin: 13.1 g/dL (ref 11.7–15.5)
MCH: 31 pg (ref 27.0–33.0)
MCHC: 33.9 g/dL (ref 32.0–36.0)
MCV: 91.5 fL (ref 80.0–100.0)
MPV: 12.2 fL (ref 7.5–12.5)
PLATELETS: 194 10*3/uL (ref 140–400)
RBC: 4.23 10*6/uL (ref 3.80–5.10)
RDW: 14 % (ref 11.0–15.0)
WBC: 4.9 10*3/uL (ref 3.8–10.8)

## 2018-06-08 LAB — LIPID PANEL
CHOLESTEROL: 219 mg/dL — AB (ref <200)
HDL: 51 mg/dL (ref >=50)
LDL Cholesterol (Calc): 145 mg/dL (calc) — ABNORMAL HIGH
Non-HDL Cholesterol (Calc): 168 mg/dL (calc) — ABNORMAL HIGH (ref <130)
TRIGLYCERIDES: 110 mg/dL (ref <150)
Total CHOL/HDL Ratio: 4.3 (calc) (ref <5.0)

## 2018-06-08 LAB — HEMOGLOBIN A1C
HEMOGLOBIN A1C: 6.1 %{Hb} — AB (ref <5.7)
Mean Plasma Glucose: 128 (calc)
eAG (mmol/L): 7.1 (calc)

## 2018-06-13 ENCOUNTER — Other Ambulatory Visit: Payer: Self-pay | Admitting: Family Medicine

## 2018-06-14 ENCOUNTER — Ambulatory Visit: Payer: PRIVATE HEALTH INSURANCE | Admitting: Family Medicine

## 2018-06-15 ENCOUNTER — Ambulatory Visit: Payer: PRIVATE HEALTH INSURANCE | Admitting: Family Medicine

## 2018-07-26 ENCOUNTER — Encounter: Payer: Self-pay | Admitting: Gastroenterology

## 2018-08-16 ENCOUNTER — Ambulatory Visit: Payer: PRIVATE HEALTH INSURANCE | Admitting: Family Medicine

## 2018-09-05 DIAGNOSIS — Z86010 Personal history of colonic polyps: Secondary | ICD-10-CM | POA: Insufficient documentation

## 2018-09-05 DIAGNOSIS — Z800 Family history of malignant neoplasm of digestive organs: Secondary | ICD-10-CM | POA: Insufficient documentation

## 2018-09-05 NOTE — Progress Notes (Addendum)
REVIEWED-NO ADDITIONAL RECOMMENDATIONS.  Referring Provider: Dr. Moshe Cipro  Primary Care Physician:  Fayrene Helper, MD Primary Gastroenterologist:  Dr. Oneida Alar  Chief Complaint  Patient presents with  . Consult    TCS due for 3 yr     HPI:   Kathleen Reynolds is a 58 y.o. female presenting today to schedule her surveillance colonoscopy. She was last seen on  07/25/2015 for the same. GI concerns at that time.   Colonoscopy July 2017 with 4 polyps were removed, diverticulosis of sigmoid, descending, and distal transverse colon. Non-bleeding internal and external hemorrhoids, and slightly redundant left colon. Pathology with 3 tubular adenomas and one hyperplastic polyps. Repeat in 3 years. She is high risk due to family history of first degree relative with colorectal cancer before age 61.   Today, she has no acute complaints. No abdominal pain. BM daily, no constipation or diarrhea. No black stools, no hematochezia. No heartburn, indigestion, acid reflux, or N/V. No dysphagia. Appetite is good. No unintentional weight loss.   Goody's powders if she has headaches. Maybe once weekly or a little less.  Glass of wine on Saturday.    Brother with history of colon cancer and sister with colon polyps.  Anemia: Chronic, stable at this this time. Hemoglobin 13.1 in May 2020.   Denies fever, chills, fatigue, lightheadedness, dizziness, pre-syncope, syncope, chest pain, palpitations, shortness of breath, cough.   Past Medical History:  Diagnosis Date  . Anemia LIFELONG   HEAVY MENSES MAR 2012 HB 13 MCV 86.7 FERRITIN  66  . Arthritis   . Breast cancer ([facility])    Right  . Breast mass in female    right  . 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 Hyperlipidemia   . Left rotator cuff tear   . Migraines   . Morbid obesity ([facility])   . Personal history of chemotherapy   . Personal history of radiation therapy   . RUPTURE ROTATOR CUFF 06/08/2007   Qualifier: Diagnosis of  By: Aline Brochure MD, Dorothyann Peng    .  Urticaria     Past Surgical History:  Procedure Laterality Date  . BREAST BIOPSY  02/13/2011   Procedure: BREAST BIOPSY WITH NEEDLE LOCALIZATION;  Surgeon: Adin Hector, MD;  Location: [facility];  Service: General;  Laterality: Right;  . BREAST LUMPECTOMY Right 2013  . BREAST SURGERY  11/1997   Left breast cancer-mastectomy  . BREAST SURGERY  2/13-   rt axilly bx  . COLONOSCOPY  2004 DR. SMITH   No polyp, hemorrhoids  . COLONOSCOPY  06/09/10   [id number] adenomas   . COLONOSCOPY N/A 08/19/2015   Procedure: COLONOSCOPY;  Surgeon: Danie Binder, MD;  [facility];  Service: Endoscopy;  Laterality: N/A;  130 - moved to 12:45 - Ginger notified pt  . DILATION AND CURETTAGE OF UTERUS    . DILATION AND CURETTAGE OF UTERUS  05/2002  . hystersonogram  2009  . MASTECTOMY  1999   left breast cancer   . PORT-A-CATH REMOVAL  10/20/2011   Procedure: MINOR REMOVAL PORT-A-CATH;  Surgeon: Adin Hector, MD;  Location: [facility];  Service: General;  Laterality: N/A;  Porta-cath removal  left  . PORTACATH PLACEMENT  03/27/2011   Procedure: INSERTION PORT-A-CATH;  Surgeon: Adin Hector, MD;  Location: [facility];  Service: General;  Laterality: Left;  insert port a cath    Current Outpatient Medications  Medication Sig Dispense Refill  . butalbital-acetaminophen-caffeine (FIORICET, ESGIC) 50-325-40 MG tablet  Take one tablet two times daily , as needed, for headache 20 tablet 2  . Cholecalciferol (VITAMIN D PO) Take 1 Dose by mouth daily.    . meclizine (ANTIVERT) 25 MG tablet Take 1 tablet (25 mg total) by mouth 3 (three) times daily as needed for dizziness. 30 tablet 2  . metFORMIN (GLUCOPHAGE) 500 MG tablet TAKE 1 TABLET BY MOUTH TWICE A DAY WITH FOOD 180 tablet 1  . Multiple Vitamin (MULITIVITAMIN WITH MINERALS) TABS Take 1 tablet by mouth daily.     No current facility-administered medications for this visit.     Allergies as of  09/06/2018 - Review Complete 09/06/2018  Allergen Reaction Noted  . Nsaids Anaphylaxis 02/05/2015  . Ibuprofen Itching 07/10/2010    Family History  Problem Relation Age of Onset  . Hypertension Mother   . Cancer Father        lung cancer  . Diabetes Sister   . Cancer Brother        colon cancer  . Heart disease Sister        heart attack  . Colon polyps Sister   . Asthma Sister   . Eczema Sister   . Allergic rhinitis Neg Hx   . Urticaria Neg Hx     Social History   Socioeconomic History  . Marital status: Single    Spouse name: Not on file  . Number of children: Not on file  . Years of education: Not on file  . Highest education level: Not on file  Occupational History  . Not on file  Social Needs  . Financial resource strain: Not on file  . Food insecurity    Worry: Not on file    Inability: Not on file  . Transportation needs    Medical: Not on file    Non-medical: Not on file  Tobacco Use  . Smoking status: Never Smoker  . Smokeless tobacco: Never Used  Substance and Sexual Activity  . Alcohol use: Yes    Alcohol/week: 2.0 standard drinks    Types: 2 Standard drinks or equivalent per week    Comment: occasional;   . Drug use: No  . Sexual activity: Yes    Birth control/protection: None  Lifestyle  . Physical activity    Days per week: Not on file    Minutes per session: Not on file  . Stress: Not on file  Relationships  . Social Herbalist on phone: Not on file    Gets together: Not on file    Attends religious service: Not on file    Active member of club or organization: Not on file    Attends meetings of clubs or organizations: Not on file    Relationship status: Not on file  . Intimate partner violence    Fear of current or ex partner: Not on file    Emotionally abused: Not on file    Physically abused: Not on file    Forced sexual activity: Not on file  Other Topics Concern  . Not on file  Social History Narrative   Single, 1  kid-58 yo female-lives with mom   Employed as a CNA   Rare Etoh.   No tobacco products    Review of Systems: Gen: See HPI  CV: See HPI  Resp: See HPI GI: See HPI. Psych: Denies depression, anxiety Heme: Denies bruising, bleeding  Physical Exam: BP (!) 148/83   Pulse 75   Temp (!) 97 F (  36.1 C) (Oral)   Ht 5\' 4"  (1.626 m)   Wt 292 lb 12.8 oz (132.8 kg)   LMP 03/27/2011   BMI 50.26 kg/m  General:   Alert and oriented. Pleasant and cooperative. Well-nourished and well-developed.  Head:  Normocephalic and atraumatic. Eyes:  Without icterus, sclera clear and conjunctiva pink.  Ears:  Normal auditory acuity. Nose:  No deformity, discharge,  or lesions.  Lungs:  Clear to auscultation bilaterally. No wheezes, rales, or rhonchi. No distress.  Heart:  S1, S2 present without murmurs appreciated.  Abdomen: +BS, soft, non-tender and non-distended. No HSM noted. No guarding or rebound. No masses appreciated.  Rectal:  Deferred  Msk:  Symmetrical without gross deformities. Normal posture. Extremities:  Without clubbing or edema. Neurologic:  Alert and  oriented x4;  grossly normal neurologically. Skin:  Intact without significant lesions or rashes. Psych: Normal mood and affect.

## 2018-09-05 NOTE — Assessment & Plan Note (Addendum)
58 y.o female with past medical history of 3 tubular adenomas and one hyperplastic polyp on last colonoscopy in 2017. Due for repeat at this time. No upper or lower GI complaints at this time. No alarm features. History of anemia. Hemoglobin was 13.1 in May 2020 and stable. She does have first degree relative with history of colon cancer and sister with history of colon polyps.   Proceed with TCS with propofol with Dr. Oneida Alar. Propofol due to BMI of 50.26. The risks, benefits, and alternatives have been discussed in detail with patient. They have stated understanding and desire to proceed.  Patient to hold metformin the morning of the procedure.  Follow-up as needed

## 2018-09-06 ENCOUNTER — Encounter: Payer: Self-pay | Admitting: *Deleted

## 2018-09-06 ENCOUNTER — Ambulatory Visit (INDEPENDENT_AMBULATORY_CARE_PROVIDER_SITE_OTHER): Payer: PRIVATE HEALTH INSURANCE | Admitting: Gastroenterology

## 2018-09-06 ENCOUNTER — Other Ambulatory Visit: Payer: Self-pay

## 2018-09-06 ENCOUNTER — Other Ambulatory Visit: Payer: Self-pay | Admitting: *Deleted

## 2018-09-06 ENCOUNTER — Encounter: Payer: Self-pay | Admitting: Gastroenterology

## 2018-09-06 DIAGNOSIS — Z800 Family history of malignant neoplasm of digestive organs: Secondary | ICD-10-CM | POA: Diagnosis not present

## 2018-09-06 DIAGNOSIS — Z86010 Personal history of colonic polyps: Secondary | ICD-10-CM

## 2018-09-06 MED ORDER — NA SULFATE-K SULFATE-MG SULF 17.5-3.13-1.6 GM/177ML PO SOLN: 1.0000 | Freq: Once | ORAL | 0 refills | Status: AC

## 2018-09-06 NOTE — Patient Instructions (Addendum)
We will get you scheduled for colonoscopy in the near future with Dr. Oneida Alar.   You can continue your metformin as prescribed the day before the procedure.   The morning of your procedure, please do not take metformin.   Follow-up as needed.   Aliene Altes, PA-C [facility]

## 2018-09-06 NOTE — Progress Notes (Signed)
cc'ed to pcp °

## 2018-09-06 NOTE — Progress Notes (Signed)
Letter mailed with pre-op appt.

## 2018-09-06 NOTE — Assessment & Plan Note (Signed)
Addressed under history of colonic polyps.  

## 2018-10-19 ENCOUNTER — Ambulatory Visit: Payer: Self-pay | Admitting: Family Medicine

## 2018-10-19 ENCOUNTER — Ambulatory Visit (INDEPENDENT_AMBULATORY_CARE_PROVIDER_SITE_OTHER): Payer: PRIVATE HEALTH INSURANCE | Admitting: Family Medicine

## 2018-10-19 ENCOUNTER — Other Ambulatory Visit: Payer: Self-pay

## 2018-10-19 ENCOUNTER — Encounter: Payer: Self-pay | Admitting: Family Medicine

## 2018-10-19 DIAGNOSIS — M25561 Pain in right knee: Secondary | ICD-10-CM | POA: Diagnosis not present

## 2018-10-19 DIAGNOSIS — G8929 Other chronic pain: Secondary | ICD-10-CM

## 2018-10-19 DIAGNOSIS — E785 Hyperlipidemia, unspecified: Secondary | ICD-10-CM | POA: Diagnosis not present

## 2018-10-19 DIAGNOSIS — R7303 Prediabetes: Secondary | ICD-10-CM

## 2018-10-19 NOTE — Progress Notes (Signed)
Virtual Visit via Telephone Note   This visit type was conducted due to national recommendations for restrictions regarding the COVID-19 Pandemic (e.g. social distancing) in an effort to limit this patient's exposure and mitigate transmission in our community.  Due to her co-morbid illnesses, this patient is at least at moderate risk for complications without adequate follow up.  This format is felt to be most appropriate for this patient at this time.  The patient did not have access to video technology/had technical difficulties with video requiring transitioning to audio format only (telephone).  All issues noted in this document were discussed and addressed.  No physical exam could be performed with this format.    Evaluation Performed:  Follow-up visit  Date:  10/19/2018   ID:  Kathleen Reynolds, DOB 11-25-1960, MRN [id number]  Patient Location: Home Provider Location: Office  Location of Patient: Home Location of Provider: Telehealth Consent was obtain for visit to be over via telehealth. I verified that I am speaking with the correct person using two identifiers.  PCP:  Fayrene Helper, MD   Chief Complaint:  Chronic conditions  History of Present Illness:    Kathleen Reynolds is a 58 y.o. female  is here for follow up regarding weight. Reports she is trying to following a low fat heart healthy diet. Reports is not exercising. Has a knee that needs to be replaced.  Reports drinking 7-8 water daily. Reports taking medications as ordered. Diet recall includes fried chicken and cooked apples last night.   Wt Readings from Last 3 Encounters:  10/19/18 285 lb (129.3 kg)  09/06/18 292 lb 12.8 oz (132.8 kg)  05/31/18 288 lb 9.6 oz (130.9 kg)    The patient does not have symptoms concerning for COVID-19 infection (fever, chills, cough, or new shortness of breath).   Past Medical, Surgical, Social History, Allergies, and Medications have been Reviewed.   Past Medical  History:  Diagnosis Date  . Anemia LIFELONG   HEAVY MENSES MAR 2012 HB 13 MCV 86.7 FERRITIN  66  . Arthritis   . Breast cancer ([place])    Right  . Breast mass in female    right  . 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 Hyperlipidemia   . Left rotator cuff tear   . Migraines   . Morbid obesity ([place])   . Personal history of chemotherapy   . Personal history of radiation therapy   . RUPTURE ROTATOR CUFF 06/08/2007   Qualifier: Diagnosis of  By: Aline Brochure MD, Dorothyann Peng    . Urticaria    Past Surgical History:  Procedure Laterality Date  . BREAST BIOPSY  02/13/2011   Procedure: BREAST BIOPSY WITH NEEDLE LOCALIZATION;  Surgeon: Adin Hector, MD;  Location: [place];  Service: General;  Laterality: Right;  . BREAST LUMPECTOMY Right 2013  . BREAST SURGERY  11/1997   Left breast cancer-mastectomy  . BREAST SURGERY  2/13-   rt axilly bx  . COLONOSCOPY  2004 DR. SMITH   No polyp, hemorrhoids  . COLONOSCOPY  06/09/10   [id number] adenomas   . COLONOSCOPY N/A 08/19/2015   Procedure: COLONOSCOPY;  Surgeon: Danie Binder, MD;  [facility];  Service: Endoscopy;  Laterality: N/A;  130 - moved to 12:45 - Ginger notified pt  . DILATION AND CURETTAGE OF UTERUS    . DILATION AND CURETTAGE OF UTERUS  05/2002  . hystersonogram  2009  . MASTECTOMY  1999   left breast cancer   .  PORT-A-CATH REMOVAL  10/20/2011   Procedure: MINOR REMOVAL PORT-A-CATH;  Surgeon: Adin Hector, MD;  Location: [place];  Service: General;  Laterality: N/A;  Porta-cath removal  left  . PORTACATH PLACEMENT  03/27/2011   Procedure: INSERTION PORT-A-CATH;  Surgeon: Adin Hector, MD;  Location: [place];  Service: General;  Laterality: Left;  insert port a cath     Current Meds  Medication Sig  . butalbital-acetaminophen-caffeine (FIORICET, ESGIC) 50-325-40 MG tablet Take one tablet two times daily , as needed, for headache  . Cholecalciferol (VITAMIN D PO) Take 1 Dose  by mouth daily.  . meclizine (ANTIVERT) 25 MG tablet Take 1 tablet (25 mg total) by mouth 3 (three) times daily as needed for dizziness.  . metFORMIN (GLUCOPHAGE) 500 MG tablet TAKE 1 TABLET BY MOUTH TWICE A DAY WITH FOOD  . Multiple Vitamin (MULITIVITAMIN WITH MINERALS) TABS Take 1 tablet by mouth daily.     Allergies:   Nsaids and Ibuprofen   Social History   Tobacco Use  . Smoking status: Never Smoker  . Smokeless tobacco: Never Used  Substance Use Topics  . Alcohol use: Yes    Alcohol/week: 2.0 standard drinks    Types: 2 Standard drinks or equivalent per week    Comment: occasional;   . Drug use: No     Family Hx: The patient's family history includes Asthma in her sister; Cancer in her brother and father; Colon polyps in her sister; Diabetes in her sister; Eczema in her sister; Heart disease in her sister; Hypertension in her mother. There is no history of Allergic rhinitis or Urticaria.  ROS:   Please see the history of present illness.    All other systems reviewed and are negative.   Labs/Other Tests and Data Reviewed:     Recent Labs: 05/24/2018: ALT 21 06/07/2018: BUN 17; Creat 0.74; Hemoglobin 13.1; Platelets 194; Potassium 4.0; Sodium 139   Recent Lipid Panel Lab Results  Component Value Date/Time   CHOL 219 (H) 06/07/2018 07:52 AM   TRIG 110 06/07/2018 07:52 AM   HDL 51 06/07/2018 07:52 AM   CHOLHDL 4.3 06/07/2018 07:52 AM   LDLCALC 145 (H) 06/07/2018 07:52 AM    Wt Readings from Last 3 Encounters:  10/19/18 285 lb (129.3 kg)  09/06/18 292 lb 12.8 oz (132.8 kg)  05/31/18 288 lb 9.6 oz (130.9 kg)     Objective:    Vital Signs:  Ht 5\' 4"  (1.626 m)   Wt 285 lb (129.3 kg)   LMP 03/27/2011   BMI 48.92 kg/m    GEN:  alert and oriented RESPIRATORY:  no shortness of breath noted in conversation PSYCH:  withdrawn in communication, flat affect, depressed mood  ASSESSMENT & PLAN:    1. Obesity, morbid, BMI 40.0-49.9 ([place]) Unchanged  Kathleen  Reynolds is re-educated about the importance of exercise daily to help with weight management. A minumum of 30 minutes daily is recommended. Additionally, importance of healthy food choices  with portion control discussed.   Wt Readings from Last 3 Encounters:  10/19/18 285 lb (129.3 kg)  09/06/18 292 lb 12.8 oz (132.8 kg)  05/31/18 288 lb 9.6 oz (130.9 kg)     - CBC - COMPLETE METABOLIC PANEL WITH GFR - Lipid panel  2. Hyperlipidemia LDL goal <100 Encouraged to avoid fried foods and fatty foods.  Currently she does not want medication  - Lipid panel  3. Prediabetes Encouraged to exercise and eat a well balanced  diet avoiding extra sugars and carbs  - Hemoglobin A1c - Lipid panel  4. Chronic pain of right knee She reports trouble with knee and needs replacement but is not scheduled for one at this time.   Time:   Today, I have spent 10 minutes with the patient with telehealth technology discussing the above problems.     Medication Adjustments/Labs and Tests Ordered: Current medicines are reviewed at length with the patient today.  Concerns regarding medicines are outlined above.   Tests Ordered: Orders Placed This Encounter  Procedures  . CBC  . COMPLETE METABOLIC PANEL WITH GFR  . Hemoglobin A1c  . Lipid panel    Medication Changes: No orders of the defined types were placed in this encounter.   Disposition:  Follow up in 3 month(s)  Signed, Perlie Mayo, NP  10/19/2018 8:56 AM     [facility]

## 2018-10-19 NOTE — Patient Instructions (Signed)
    Thank you for completing your visit via phone. I appreciate the opportunity to provide you with the care for your health and wellness. Today we discussed: weight and diet  Follow Up: 3 months in office with Dr Moshe Cipro   Labs placed, please get them fasting 1 week before next appt  Please let us know when you get the flu shot at work so we can document it.  Please continue to work on making healthy food choices.  Avoid fried and high fat foods. Avoid extra sugars and carbs like sweets, breads, rice.  Eat a colorful plate of fruits and veggies, they should be 75% of your meal.  Please continue to practice social distancing to keep you, your family, and our community safe.  If you must go out, please wear a Mask and practice good handwashing.  [place] YOUR HANDS WELL AND FREQUENTLY. AVOID TOUCHING YOUR FACE, UNLESS YOUR HANDS ARE FRESHLY WASHED.  GET FRESH AIR DAILY. STAY HYDRATED WITH WATER.   It was a pleasure to see you and I look forward to continuing to work together on your health and well-being. Please do not hesitate to call the office if you need care or have questions about your care.  Have a wonderful day and week. With Gratitude, Cherly Beach, DNP, AGNP-BC

## 2018-11-25 ENCOUNTER — Encounter (HOSPITAL_COMMUNITY): Payer: Self-pay

## 2018-11-25 ENCOUNTER — Telehealth: Payer: Self-pay | Admitting: Internal Medicine

## 2018-11-25 ENCOUNTER — Other Ambulatory Visit (HOSPITAL_COMMUNITY)
Admission: RE | Admit: 2018-11-25 | Discharge: 2018-11-25 | Disposition: A | Discharge status: Home / Self Care | Payer: PRIVATE HEALTH INSURANCE | Source: Ambulatory Visit | Attending: Gastroenterology | Admitting: Gastroenterology

## 2018-11-25 ENCOUNTER — Other Ambulatory Visit: Payer: Self-pay

## 2018-11-25 ENCOUNTER — Encounter (HOSPITAL_COMMUNITY)
Admission: RE | Admit: 2018-11-25 | Discharge: 2018-11-25 | Disposition: A | Discharge status: Home / Self Care | Payer: PRIVATE HEALTH INSURANCE | Source: Ambulatory Visit | Attending: Gastroenterology | Admitting: Gastroenterology

## 2018-11-25 DIAGNOSIS — Z20828 Contact with and (suspected) exposure to other viral communicable diseases: Secondary | ICD-10-CM | POA: Insufficient documentation

## 2018-11-25 DIAGNOSIS — Z01812 Encounter for preprocedural laboratory examination: Secondary | ICD-10-CM | POA: Insufficient documentation

## 2018-11-25 LAB — SARS CORONAVIRUS 2 (TAT 6-24 HRS): SARS Coronavirus 2: NEGATIVE

## 2018-11-25 MED ORDER — NA SULFATE-K SULFATE-MG SULF 17.5-3.13-1.6 GM/177ML PO SOLN: 1.0000 | Freq: Once | ORAL | 0 refills | Status: AC

## 2018-11-25 NOTE — Telephone Encounter (Signed)
Rx was sent when patient was scheduled. Rx resent

## 2018-11-25 NOTE — Telephone Encounter (Signed)
Pt said she needed her prep rx called into [facility] in [place], [phone]

## 2018-11-29 ENCOUNTER — Other Ambulatory Visit: Payer: Self-pay

## 2018-11-29 ENCOUNTER — Encounter (HOSPITAL_COMMUNITY): Payer: Self-pay

## 2018-11-29 ENCOUNTER — Ambulatory Visit (HOSPITAL_COMMUNITY): Payer: PRIVATE HEALTH INSURANCE | Admitting: Anesthesiology

## 2018-11-29 ENCOUNTER — Ambulatory Visit (HOSPITAL_COMMUNITY)
Admission: RE | Admit: 2018-11-29 | Discharge: 2018-11-29 | Disposition: A | Discharge status: Home / Self Care | Payer: PRIVATE HEALTH INSURANCE | Attending: Gastroenterology | Admitting: Gastroenterology

## 2018-11-29 ENCOUNTER — Encounter (HOSPITAL_COMMUNITY)
Admission: RE | Disposition: A | Discharge status: Home / Self Care | Payer: Self-pay | Source: Home / Self Care | Attending: Gastroenterology

## 2018-11-29 DIAGNOSIS — Z6841 Body Mass Index (BMI) 40.0 and over, adult: Secondary | ICD-10-CM | POA: Insufficient documentation

## 2018-11-29 DIAGNOSIS — E119 Type 2 diabetes mellitus without complications: Secondary | ICD-10-CM | POA: Diagnosis not present

## 2018-11-29 DIAGNOSIS — D125 Benign neoplasm of sigmoid colon: Secondary | ICD-10-CM | POA: Insufficient documentation

## 2018-11-29 DIAGNOSIS — Z7984 Long term (current) use of oral hypoglycemic drugs: Secondary | ICD-10-CM | POA: Insufficient documentation

## 2018-11-29 DIAGNOSIS — Z791 Long term (current) use of non-steroidal anti-inflammatories (NSAID): Secondary | ICD-10-CM | POA: Insufficient documentation

## 2018-11-29 DIAGNOSIS — K635 Polyp of colon: Secondary | ICD-10-CM

## 2018-11-29 DIAGNOSIS — R519 Headache, unspecified: Secondary | ICD-10-CM | POA: Diagnosis not present

## 2018-11-29 DIAGNOSIS — M1990 Unspecified osteoarthritis, unspecified site: Secondary | ICD-10-CM | POA: Diagnosis not present

## 2018-11-29 DIAGNOSIS — Z853 Personal history of malignant neoplasm of breast: Secondary | ICD-10-CM | POA: Insufficient documentation

## 2018-11-29 DIAGNOSIS — D123 Benign neoplasm of transverse colon: Secondary | ICD-10-CM | POA: Insufficient documentation

## 2018-11-29 DIAGNOSIS — K648 Other hemorrhoids: Secondary | ICD-10-CM | POA: Diagnosis not present

## 2018-11-29 DIAGNOSIS — D122 Benign neoplasm of ascending colon: Secondary | ICD-10-CM | POA: Diagnosis not present

## 2018-11-29 DIAGNOSIS — Z86010 Personal history of colonic polyps: Secondary | ICD-10-CM | POA: Diagnosis not present

## 2018-11-29 DIAGNOSIS — Z800 Family history of malignant neoplasm of digestive organs: Secondary | ICD-10-CM

## 2018-11-29 DIAGNOSIS — K5730 Diverticulosis of large intestine without perforation or abscess without bleeding: Secondary | ICD-10-CM | POA: Diagnosis not present

## 2018-11-29 DIAGNOSIS — K644 Residual hemorrhoidal skin tags: Secondary | ICD-10-CM | POA: Diagnosis not present

## 2018-11-29 HISTORY — PX: POLYPECTOMY: SHX5525

## 2018-11-29 HISTORY — PX: COLONOSCOPY WITH PROPOFOL: SHX5780

## 2018-11-29 LAB — GLUCOSE, CAPILLARY
Glucose-Capillary: 105 mg/dL — ABNORMAL HIGH (ref 70–99)
Glucose-Capillary: 109 mg/dL — ABNORMAL HIGH (ref 70–99)

## 2018-11-29 SURGERY — COLONOSCOPY WITH PROPOFOL
Anesthesia: General

## 2018-11-29 MED ORDER — CHLORHEXIDINE GLUCONATE CLOTH 2 % EX PADS: 6.0000 | MEDICATED_PAD | Freq: Once | CUTANEOUS | Status: DC

## 2018-11-29 MED ORDER — PROPOFOL 10 MG/ML IV BOLUS
INTRAVENOUS | Status: DC | PRN
  Administered 2018-11-29: 120 mg via INTRAVENOUS

## 2018-11-29 MED ORDER — PROPOFOL 10 MG/ML IV BOLUS
INTRAVENOUS | Status: AC
  Filled 2018-11-29: qty 60

## 2018-11-29 MED ORDER — LACTATED RINGERS IV SOLN
INTRAVENOUS | Status: DC | PRN
  Administered 2018-11-29: 07:00:00 via INTRAVENOUS

## 2018-11-29 MED ORDER — MIDAZOLAM HCL 2 MG/2ML IJ SOLN
INTRAMUSCULAR | Status: AC
  Filled 2018-11-29: qty 2

## 2018-11-29 MED ORDER — PROPOFOL 500 MG/50ML IV EMUL
INTRAVENOUS | Status: DC | PRN
  Administered 2018-11-29: 120 ug/kg/min via INTRAVENOUS

## 2018-11-29 MED ORDER — KETAMINE HCL 10 MG/ML IJ SOLN
INTRAMUSCULAR | Status: DC | PRN
  Administered 2018-11-29: 30 mg via INTRAVENOUS

## 2018-11-29 MED ORDER — KETAMINE HCL 50 MG/5ML IJ SOSY
PREFILLED_SYRINGE | INTRAMUSCULAR | Status: AC
  Filled 2018-11-29: qty 5

## 2018-11-29 MED ORDER — LACTATED RINGERS IV SOLN
Freq: Once | INTRAVENOUS | Status: AC
  Administered 2018-11-29: 07:00:00 via INTRAVENOUS

## 2018-11-29 MED ORDER — MIDAZOLAM HCL 5 MG/5ML IJ SOLN
INTRAMUSCULAR | Status: DC | PRN
  Administered 2018-11-29: 2 mg via INTRAVENOUS

## 2018-11-29 NOTE — Anesthesia Postprocedure Evaluation (Signed)
Anesthesia Post Note  Patient: [name]  Procedure(s) Performed: COLONOSCOPY WITH PROPOFOL (N/A ) POLYPECTOMY  Patient location during evaluation: PACU Anesthesia Type: MAC Level of consciousness: awake, oriented and awake and alert Pain management: pain level controlled Vital Signs Assessment: post-procedure vital signs reviewed and stable Respiratory status: spontaneous breathing, respiratory function stable and nonlabored ventilation Cardiovascular status: stable Postop Assessment: no apparent nausea or vomiting Anesthetic complications: no     Last Vitals:  Vitals:   11/29/18 0652  BP: 123/77  Pulse: 60  Resp: 18  Temp: 36.6 C  SpO2: 95%    Last Pain:  Vitals:   11/29/18 0738  TempSrc:   PainSc: 0-No pain                 Kathleen Reynolds

## 2018-11-29 NOTE — H&P (Signed)
Primary Care Physician:  Fayrene Helper, MD Primary Gastroenterologist:  Dr. Oneida Alar  Pre-Procedure History & Physical: HPI:  Kathleen Reynolds is a 58 y.o. female here for  PERSONAL HISTORY OF POLYPS.  Past Medical History:  Diagnosis Date  . Anemia LIFELONG   HEAVY MENSES MAR 2012 HB 13 MCV 86.7 FERRITIN  66  . Arthritis   . Breast cancer ([place])    Right  . Breast mass in female    right  . 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 Hyperlipidemia   . Left rotator cuff tear   . Migraines   . Morbid obesity ([place])   . Personal history of chemotherapy   . Personal history of radiation therapy   . RUPTURE ROTATOR CUFF 06/08/2007   Qualifier: Diagnosis of  By: Aline Brochure MD, Dorothyann Peng    . Urticaria     Past Surgical History:  Procedure Laterality Date  . BREAST BIOPSY  02/13/2011   Procedure: BREAST BIOPSY WITH NEEDLE LOCALIZATION;  Surgeon: Adin Hector, MD;  Location: [place];  Service: General;  Laterality: Right;  . BREAST LUMPECTOMY Right 2013  . BREAST SURGERY  11/1997   Left breast cancer-mastectomy  . BREAST SURGERY  2/13-   rt axilly bx  . COLONOSCOPY  2004 DR. SMITH   No polyp, hemorrhoids  . COLONOSCOPY  06/09/10   [id number] adenomas   . COLONOSCOPY N/A 08/19/2015   Procedure: COLONOSCOPY;  Surgeon: Danie Binder, MD;  [facility];  Service: Endoscopy;  Laterality: N/A;  130 - moved to 12:45 - Ginger notified pt  . DILATION AND CURETTAGE OF UTERUS    . DILATION AND CURETTAGE OF UTERUS  05/2002  . hystersonogram  2009  . MASTECTOMY  1999   left breast cancer   . PORT-A-CATH REMOVAL  10/20/2011   Procedure: MINOR REMOVAL PORT-A-CATH;  Surgeon: Adin Hector, MD;  Location: [place];  Service: General;  Laterality: N/A;  Porta-cath removal  left  . PORTACATH PLACEMENT  03/27/2011   Procedure: INSERTION PORT-A-CATH;  Surgeon: Adin Hector, MD;  Location: [place];  Service: General;  Laterality: Left;   insert port a cath    Prior to Admission medications   Medication Sig Start Date End Date Taking? Authorizing Provider  acetaminophen (TYLENOL) 500 MG tablet Take 1,000 mg by mouth every 6 (six) hours as needed for mild pain or moderate pain.   Yes [provider]  Ascorbic Acid (VITAMIN C) 1000 MG tablet Take 1,000 mg by mouth daily.   Yes [provider]  butalbital-acetaminophen-caffeine (FIORICET, ESGIC) 50-325-40 MG tablet Take one tablet two times daily , as needed, for headache Patient taking differently: Take 1 tablet by mouth every 6 (six) hours as needed for headache. Take one tablet two times daily , as needed, for headache 04/18/18  Yes Fayrene Helper, MD  Cholecalciferol (VITAMIN D PO) Take 1,000 mg by mouth daily.    Yes [provider]  meclizine (ANTIVERT) 25 MG tablet Take 1 tablet (25 mg total) by mouth 3 (three) times daily as needed for dizziness. 11/15/17  Yes Fayrene Helper, MD  metFORMIN (GLUCOPHAGE) 500 MG tablet TAKE 1 TABLET BY MOUTH TWICE A DAY WITH FOOD Patient taking differently: Take 500 mg by mouth 2 (two) times daily with a meal.  06/13/18  Yes Fayrene Helper, MD  Multiple Vitamin (MULITIVITAMIN WITH MINERALS) TABS Take 1 tablet by mouth daily.   Yes [provider]  Allergies as of 09/06/2018 - Review Complete 09/06/2018  Allergen Reaction Noted  . Nsaids Anaphylaxis 02/05/2015  . Ibuprofen Itching 07/10/2010    Family History  Problem Relation Age of Onset  . Hypertension Mother   . Cancer Father        lung cancer  . Diabetes Sister   . Cancer Brother        colon cancer  . Heart disease Sister        heart attack  . Colon polyps Sister   . Asthma Sister   . Eczema Sister   . Allergic rhinitis Neg Hx   . Urticaria Neg Hx     Social History   Socioeconomic History  . Marital status: Single    Spouse name: Not on file  . Number of children: Not on file  . Years of education: Not on file   . Highest education level: Not on file  Occupational History  . Not on file  Social Needs  . Financial resource strain: Not on file  . Food insecurity    Worry: Not on file    Inability: Not on file  . Transportation needs    Medical: Not on file    Non-medical: Not on file  Tobacco Use  . Smoking status: Never Smoker  . Smokeless tobacco: Never Used  Substance and Sexual Activity  . Alcohol use: Yes    Alcohol/week: 2.0 standard drinks    Types: 2 Standard drinks or equivalent per week    Comment: occasional;   . Drug use: No  . Sexual activity: Yes    Birth control/protection: None  Lifestyle  . Physical activity    Days per week: Not on file    Minutes per session: Not on file  . Stress: Not on file  Relationships  . Social Herbalist on phone: Not on file    Gets together: Not on file    Attends religious service: Not on file    Active member of club or organization: Not on file    Attends meetings of clubs or organizations: Not on file    Relationship status: Not on file  . Intimate partner violence    Fear of current or ex partner: Not on file    Emotionally abused: Not on file    Physically abused: Not on file    Forced sexual activity: Not on file  Other Topics Concern  . Not on file  Social History Narrative   Single, 1 kid-58 yo female-lives with mom   Employed as a CNA   Rare Etoh.   No tobacco products    Review of Systems: See HPI, otherwise negative ROS   Physical Exam: BP 123/77   Pulse 60   Temp 97.8 F (36.6 C) (Oral)   Resp 18   Ht 5\' 4"  (1.626 m)   Wt 131.1 kg   LMP 03/27/2011   SpO2 95%   BMI 49.61 kg/m  General:   Alert,  pleasant and cooperative in NAD Head:  Normocephalic and atraumatic. Neck:  Supple; Lungs:  Clear throughout to auscultation.    Heart:  Regular rate and rhythm. Abdomen:  Soft, nontender and nondistended. Normal bowel sounds, without guarding, and without rebound.   Neurologic:  Alert and   oriented x4;  grossly normal neurologically.  Impression/Plan:     PERSONAL HISTORY OF POLYPS.  PLAN: 1. TCS TODAY. DISCUSSED PROCEDURE, BENEFITS, & RISKS: < 1% chance of medication reaction, bleeding, perforation,  ASPIRATION, or rupture of spleen/liver requiring surgery to fix it and missed polyps < 1 cm 10-20% of the time.

## 2018-11-29 NOTE — Op Note (Signed)
[facility] Patient Name: Kathleen Reynolds Procedure Date: 11/29/2018 7:14 AM MRN: [id number] Date of Birth: [birth year] Attending MD: Barney Drain MD, MD CSN: [id number] Age: 58 Admit Type: Outpatient Procedure:                Colonoscopy WITH SNARE CAUTERY/COLD SNARE                            POLYPECTOMY Indications:              Personal history of colonic polyps Providers:                Barney Drain MD, MD, Otis Peak B. Sharon Seller, RN,                            Randa Spike, Technician Referring MD:             Norwood Levo. Simpson MD, MD Medicines:                Propofol per Anesthesia Complications:            No immediate complications. Estimated Blood Loss:     Estimated blood loss was minimal. Procedure:                Pre-Anesthesia Assessment:                           - Prior to the procedure, a History and Physical                            was performed, and patient medications and                            allergies were reviewed. The patient's tolerance of                            previous anesthesia was also reviewed. The risks                            and benefits of the procedure and the sedation                            options and risks were discussed with the patient.                            All questions were answered, and informed consent                            was obtained. Prior Anticoagulants: The patient has                            taken no previous anticoagulant or antiplatelet                            agents. ASA Grade Assessment: III - A patient with  severe systemic disease. After reviewing the risks                            and benefits, the patient was deemed in                            satisfactory condition to undergo the procedure.                            After obtaining informed consent, the colonoscope                            was passed under direct vision. Throughout the                   procedure, the patient's blood pressure, pulse, and                            oxygen saturations were monitored continuously. The                            [id number]-[id number]) scope was introduced through                            the anus and advanced to the the cecum, identified                            by appendiceal orifice and ileocecal valve. The                            colonoscopy was somewhat difficult due to a                            tortuous colon and the patient's body habitus.                            Successful completion of the procedure was aided by                            straightening and shortening the scope to obtain                            bowel loop reduction and COLOWRAP. The patient                            tolerated the procedure well. The quality of the                            bowel preparation was excellent. The ileocecal                            valve, appendiceal orifice, and rectum were  photographed. Scope In: 7:44:30 AM Scope Out: 8:05:34 AM Scope Withdrawal Time: 0 hours 18 minutes 31 seconds  Total Procedure Duration: 0 hours 21 minutes 4 seconds  Findings:      Four sessile polyps were found in the sigmoid colon, transverse colon       and ascending colon. The polyps were 3 to 5 mm in size. These polyps       were removed with a cold snare. Resection and retrieval were complete.      A 7 mm polyp was found in the proximal transverse colon. The polyp was       sessile. The polyp was removed with a hot snare. Resection and retrieval       were complete.      Multiple small and large-mouthed diverticula were found in the       recto-sigmoid colon, sigmoid colon, descending colon, splenic flexure       and transverse colon.      External and internal hemorrhoids were found.      The recto-sigmoid colon and sigmoid colon were mildly tortuous. Impression:               - Four 3 to 5 mm  polyps in the sigmoid colon(2)(BTL                            2), in the transverse colon and in the ascending                            colon, removed with a cold snare. Resected and                            retrieved.                           - One 7 mm polyp in the proximal transverse colon,                            removed with a COLD SNARE INITIALLY AND COMPLETED                            WITH hot snare TO CONTROL BLEEDING. Resected and                            retrieved.                           - Diverticulosis in the recto-sigmoid colon, in the                            sigmoid colon, in the descending colon, at the                            splenic flexure and in the transverse colon.                           - External and internal hemorrhoids.                           -  Tortuous LEFT colon. Moderate Sedation:      Per Anesthesia Care Recommendation:           - Patient has a contact number available for                            emergencies. The signs and symptoms of potential                            delayed complications were discussed with the                            patient. Return to normal activities tomorrow.                            Written discharge instructions were provided to the                            patient.                           - High fiber diet.                           - Continue present medications.                           - Await pathology results.                           - Repeat colonoscopy date to be determined after                            pending pathology results are reviewed for                            surveillance. Procedure Code(s):        --- Professional ---                           [id number], Colonoscopy, flexible; with removal of                            tumor(s), polyp(s), or other lesion(s) by snare                            technique Diagnosis Code(s):        --- Professional ---                            K63.5, Polyp of colon                           K64.8, Other hemorrhoids                           Z86.010, Personal history of colonic polyps  K57.30, Diverticulosis of large intestine without                            perforation or abscess without bleeding                           Q43.8, Other specified congenital malformations of                            intestine CPT copyright 2019 American Medical Association. All rights reserved. The codes documented in this report are preliminary and upon coder review may  be revised to meet current compliance requirements. Barney Drain, MD Barney Drain MD, MD 11/29/2018 8:18:16 AM This report has been signed electronically. Number of Addenda: 0

## 2018-11-29 NOTE — Anesthesia Preprocedure Evaluation (Addendum)
Anesthesia Evaluation  Patient identified by MRN, date of birth, ID band Patient awake    Reviewed: Allergy & Precautions, NPO status , Patient's Chart, lab work & pertinent test results  Airway Mallampati: II  TM Distance: >3 FB Neck ROM: Full    Dental no notable dental hx.    Pulmonary neg pulmonary ROS,    breath sounds clear to auscultation       Cardiovascular negative cardio ROS Normal cardiovascular exam Rhythm:Regular Rate:Normal     Neuro/Psych  Headaches,  Neuromuscular disease    GI/Hepatic negative GI ROS, Neg liver ROS,   Endo/Other  diabetes, Well Controlled, Type 2, Oral Hypoglycemic AgentsMorbid obesity  Renal/GU negative Renal ROS  negative genitourinary   Musculoskeletal  (+) Arthritis , Osteoarthritis,    Abdominal Normal abdominal exam  (+)   Peds negative pediatric ROS (+)  Hematology  (+) anemia ,   Anesthesia Other Findings   Reproductive/Obstetrics                            Anesthesia Physical Anesthesia Plan  ASA: III  Anesthesia Plan: General   Post-op Pain Management:    Induction: Intravenous  PONV Risk Score and Plan:   Airway Management Planned: Natural Airway, Nasal Cannula and Simple Face Mask  Additional Equipment:   Intra-op Plan:   Post-operative Plan:   Informed Consent: I have reviewed the patients History and Physical, chart, labs and discussed the procedure including the risks, benefits and alternatives for the proposed anesthesia with the patient or authorized representative who has indicated his/her understanding and acceptance.     Dental advisory given  Plan Discussed with: CRNA  Anesthesia Plan Comments:         Anesthesia Quick Evaluation

## 2018-11-29 NOTE — Discharge Instructions (Signed)
You have internal AND EXTERNAL hemorrhoids and diverticulosis IN YOUR LEFT AND RIGHT COLON. YOU HAD FIVE POLYPS REMOVED.    EAT TO LIVE AND THINK OF FOOD AS MEDICINE. 75% OF YOUR PLATE SHOULD BE FRUITS/VEGGIES.  To have more energy, and to lose weight:      1. CONTINUE YOUR WEIGHT LOSS EFFORTS. YOUR BODY MASS INDEX (BMI) IS OVER 40 WHICH MEANS YOU ARE MORBIDLY OBESE. OBESITY ACTIVATES CANCER GENES. MORBID OBESITY SHORTENS YOUR LIFE EXPECTANCY 10 YEARS. OBESITY IS ASSOCIATED WITH AN INCREASE RISK FOR CIRRHOSIS, COLON POLYPS, AND ALL CANCERS, INCLUDING ESOPHAGEAL AND COLON CANCER. I RECOMMEND YOU READ AND FOLLOW RECOMMENDATIONS of DR. MARK HYMAN, "10-DAY DETOX DIET".  OTHERWISE, FOLLOW A HIGH FIBER DIET. AVOID ITEMS THAT CAUSE BLOATING. See info below.     2. If you must eat bread, EAT EZEKIEL BREAD. IT IS IN THE FROZEN SECTION OF THE GROCERY STORE.    3. DRINK WATER WITH FRUIT OR CUCUMBER ADDED. YOUR URINE SHOULD BE LIGHT YELLOW. AVOID SODA, GATORADE, ENERGY DRINKS, OR DIET SODA.     4. AVOID HIGH FRUCTOSE CORN SYRUP AND CAFFEINE.     5. DO NOT chew SUGAR FREE GUM OR USE ARTIFICIAL SWEETENERS. IF NEEDED USE STEVIA AS A SWEETENER.    6. DO NOT EAT ENRICHED WHEAT FLOUR, PASTA, RICE, OR CEREAL.    7. ONLY EAT WILD CAUGHT SEAFOOD, GRASS FED BEEF OR CHICKEN, PORK FROM PASTURE RAISE PIGS, OR EGGS FROM PASTURE RAISED CHICKENS.    8. PRACTICE CHAIR YOGA FOR 15-30 MINS 3 OR 4 TIMES A WEEK AND PROGRESS TO HATHA YOGA OVER NEXT 6 MOS.    9. START TAKING VITAMIN B12 DAILY. CONTINUE MULTIVITAMIN. INCREASE VITAMIN D TO 2000 IU DAILY.   ADDITIONAL SUPPLEMENTS TO DECREASE CRAVING AND SUPPRESS YOUR APPETITE:    1. CINNAMON 500 MG EVERY AM PRIOR TO FIRST MEAL.   **STABILIZES BLOOD GLUCOSE**    2. CHROMIUM 400-500 MG WITH MEALS TWICE DAILY.    **FAT BURNER**    3. GREEN TEA EXTRACT ONE DAILY.   **FAT BURNER/SUPPRESSES YOUR APPETITE**     USE PREPARATION H FOUR TIMES  A DAY IF NEEDED TO RELIEVE  RECTAL PAIN/PRESSURE/BLEEDING.   YOUR BIOPSY RESULTS WILL BE BACK IN 5 BUSINESS DAYS.  FOLLOW UP IN 6 MOS WITH DR. Katoria Yetman. PLEASE CALL WITH QUESTIONS OR CONCERNS.  Next colonoscopy WILL BE SCHEDULE IN 3-5 YEARS.  Colonoscopy Care After Read the instructions outlined below and refer to this sheet in the next week. These discharge instructions provide you with general information on caring for yourself after you leave the hospital. While your treatment has been planned according to the most current medical practices available, unavoidable complications occasionally occur. If you have any problems or questions after discharge, call DR. Adaline Trejos, [phone].  ACTIVITY  You may resume your regular activity, but move at a slower pace for the next 24 hours.   Take frequent rest periods for the next 24 hours.   Walking will help get rid of the air and reduce the bloated feeling in your belly (abdomen).   No driving for 24 hours (because of the medicine (anesthesia) used during the test).   You may shower.   Do not sign any important legal documents or operate any machinery for 24 hours (because of the anesthesia used during the test).    NUTRITION  Drink plenty of fluids.   You may resume your normal diet as instructed by your doctor.   Begin with a light  meal and progress to your normal diet. Heavy or fried foods are harder to digest and may make you feel sick to your stomach (nauseated).   Avoid alcoholic beverages for 24 hours or as instructed.    MEDICATIONS  You may resume your normal medications.   WHAT YOU CAN EXPECT TODAY  Some feelings of bloating in the abdomen.   Passage of more gas than usual.   Spotting of blood in your stool or on the toilet paper  .  IF YOU HAD POLYPS REMOVED DURING THE COLONOSCOPY:  Eat a soft diet IF YOU HAVE NAUSEA, BLOATING, ABDOMINAL PAIN, OR VOMITING.    FINDING OUT THE RESULTS OF YOUR TEST Not all test results are available  during your visit. DR. Oneida Alar WILL CALL YOU WITHIN 14 DAYS OF YOUR PROCEDUE WITH YOUR RESULTS. Do not assume everything is normal if you have not heard from DR. Zakir Henner, CALL HER OFFICE AT [phone].  SEEK IMMEDIATE MEDICAL ATTENTION AND CALL THE OFFICE: [phone] IF:  You have more than a spotting of blood in your stool.   Your belly is swollen (abdominal distention).   You are nauseated or vomiting.   You have a temperature over 101F.   You have abdominal pain or discomfort that is severe or gets worse throughout the day.  High-Fiber Diet A high-fiber diet changes your normal diet to include more whole grains, legumes, fruits, and vegetables. Changes in the diet involve replacing refined carbohydrates with unrefined foods. The calorie level of the diet is essentially unchanged. The Dietary Reference Intake (recommended amount) for adult males is 38 grams per day. For adult females, it is 25 grams per day. Pregnant and lactating women should consume 28 grams of fiber per day. Fiber is the intact part of a plant that is not broken down during digestion. Functional fiber is fiber that has been isolated from the plant to provide a beneficial effect in the body. PURPOSE  Increase stool bulk.   Ease and regulate bowel movements.   Lower cholesterol.   REDUCE RISK OF COLON CANCER  INDICATIONS THAT YOU NEED MORE FIBER  Constipation and hemorrhoids.   Uncomplicated diverticulosis (intestine condition) and irritable bowel syndrome.   Weight management.   As a protective measure against hardening of the arteries (atherosclerosis), diabetes, and cancer.   GUIDELINES FOR INCREASING FIBER IN THE DIET  Start adding fiber to the diet slowly. A gradual increase of about 5 more grams (2 slices of whole-wheat bread, 2 servings of most fruits or vegetables, or 1 bowl of high-fiber cereal) per day is best. Too rapid an increase in fiber may result in constipation, flatulence, and bloating.     Drink enough water and fluids to keep your urine clear or pale yellow. Water, juice, or caffeine-free drinks are recommended. Not drinking enough fluid may cause constipation.   Eat a variety of high-fiber foods rather than one type of fiber.   Try to increase your intake of fiber through using high-fiber foods rather than fiber pills or supplements that contain small amounts of fiber.   The goal is to change the types of food eaten. Do not supplement your present diet with high-fiber foods, but replace foods in your present diet.   INCLUDE A VARIETY OF FIBER SOURCES  Replace refined and processed grains with whole grains, canned fruits with fresh fruits, and incorporate other fiber sources. White rice, white breads, and most bakery goods contain little or no fiber.   Brown whole-grain rice, buckwheat oats,  and many fruits and vegetables are all good sources of fiber. These include: broccoli, Brussels sprouts, cabbage, cauliflower, beets, sweet potatoes, white potatoes (skin on), carrots, tomatoes, eggplant, squash, berries, fresh fruits, and dried fruits.   Cereals appear to be the richest source of fiber. Cereal fiber is found in whole grains and bran. Bran is the fiber-rich outer coat of cereal grain, which is largely removed in refining. In whole-grain cereals, the bran remains. In breakfast cereals, the largest amount of fiber is found in those with "bran" in their names. The fiber content is sometimes indicated on the label.   You may need to include additional fruits and vegetables each day.   In baking, for 1 cup white flour, you may use the following substitutions:   1 cup whole-wheat flour minus 2 tablespoons.   1/2 cup white flour plus 1/2 cup whole-wheat flour.   Polyps, Colon  A polyp is extra tissue that grows inside your body. Colon polyps grow in the large intestine. The large intestine, also called the colon, is part of your digestive system. It is a long, hollow tube at  the end of your digestive tract where your body makes and stores stool. Most polyps are not dangerous. They are benign. This means they are not cancerous. But over time, some types of polyps can turn into cancer. Polyps that are smaller than a pea are usually not harmful. But larger polyps could someday become or may already be cancerous. To be safe, doctors remove all polyps and test them.   PREVENTION There is not one sure way to prevent polyps. You might be able to lower your risk of getting them if you:  Eat more fruits and vegetables and less fatty food.   Do not smoke.   Avoid alcohol.   Exercise every day.   Lose weight if you are overweight.   Eating more calcium and folate can also lower your risk of getting polyps. Some foods that are rich in calcium are milk, cheese, and broccoli. Some foods that are rich in folate are chickpeas, kidney beans, and spinach.    Diverticulosis Diverticulosis is a common condition that develops when small pouches (diverticula) form in the wall of the colon. The risk of diverticulosis increases with age. It happens more often in people who eat a low-fiber diet. Most individuals with diverticulosis have no symptoms. Those individuals with symptoms usually experience belly (abdominal) pain, constipation, or loose stools (diarrhea).  HOME CARE INSTRUCTIONS  Increase the amount of fiber in your diet as directed by your caregiver or dietician. This may reduce symptoms of diverticulosis.   Drink at least 6 to 8 glasses of water each day to prevent constipation.   Try not to strain when you have a bowel movement.   Avoiding nuts and seeds to prevent complications is NOT NECESSARY.   FOODS HAVING HIGH FIBER CONTENT INCLUDE:  Fruits. Apple, peach, pear, tangerine, raisins, prunes.   Vegetables. Brussels sprouts, asparagus, broccoli, cabbage, carrot, cauliflower, romaine lettuce, spinach, summer squash, tomato, winter squash, zucchini.   Starchy  Vegetables. Baked beans, kidney beans, lima beans, split peas, lentils, potatoes (with skin).   Grains. Whole wheat bread, brown rice, bran flake cereal, plain oatmeal, white rice, shredded wheat, bran muffins.   SEEK IMMEDIATE MEDICAL CARE IF:  You develop increasing pain or severe bloating.   You have an oral temperature above 101F.   You develop vomiting or bowel movements that are bloody or black.   Hemorrhoids Hemorrhoids are  dilated (enlarged) veins around the rectum. Sometimes clots will form in the veins. This makes them swollen and painful. These are called thrombosed hemorrhoids. Causes of hemorrhoids include:  Constipation.   Straining to have a bowel movement.   HEAVY LIFTING   HOME CARE INSTRUCTIONS  Eat a well balanced diet and drink 6 to 8 glasses of water every day to avoid constipation. You may also use a bulk laxative.   Avoid straining to have bowel movements.   Keep anal area dry and clean.   Do not use a donut shaped pillow or sit on the toilet for long periods. This increases blood pooling and pain.   Move your bowels when your body has the urge; this will require less straining and will decrease pain and pressure.

## 2018-11-29 NOTE — Transfer of Care (Signed)
Immediate Anesthesia Transfer of Care Note  Patient: Kathleen Reynolds  Procedure(s) Performed: COLONOSCOPY WITH PROPOFOL (N/A ) POLYPECTOMY  Patient Location: PACU  Anesthesia Type:MAC  Level of Consciousness: awake, alert , oriented and patient cooperative  Airway & Oxygen Therapy: Patient Spontanous Breathing  Post-op Assessment: Report given to RN, Post -op Vital signs reviewed and stable and Patient moving all extremities X 4  Post vital signs: Reviewed and stable  Last Vitals:  Vitals Value Taken Time  BP    Temp    Pulse    Resp    SpO2      Last Pain:  Vitals:   11/29/18 0738  TempSrc:   PainSc: 0-No pain      Patients Stated Pain Goal: 3 (68/34/19 6222)  Complications: No apparent anesthesia complications

## 2018-11-30 LAB — SURGICAL PATHOLOGY

## 2018-12-02 ENCOUNTER — Encounter (HOSPITAL_COMMUNITY): Payer: Self-pay | Admitting: Gastroenterology

## 2018-12-05 NOTE — Progress Notes (Signed)
cc'd to pcp and on recall

## 2018-12-05 NOTE — Progress Notes (Signed)
ON RECALL  °

## 2018-12-07 ENCOUNTER — Other Ambulatory Visit: Payer: Self-pay | Admitting: Family Medicine

## 2018-12-19 ENCOUNTER — Ambulatory Visit (HOSPITAL_COMMUNITY)
Admission: RE | Admit: 2018-12-19 | Discharge: 2018-12-19 | Disposition: A | Discharge status: Home / Self Care | Payer: PRIVATE HEALTH INSURANCE | Source: Ambulatory Visit | Attending: Nurse Practitioner | Admitting: Nurse Practitioner

## 2018-12-19 ENCOUNTER — Inpatient Hospital Stay (HOSPITAL_COMMUNITY): Payer: PRIVATE HEALTH INSURANCE | Attending: Hematology

## 2018-12-19 ENCOUNTER — Other Ambulatory Visit: Payer: Self-pay

## 2018-12-19 DIAGNOSIS — C50911 Malignant neoplasm of unspecified site of right female breast: Secondary | ICD-10-CM

## 2018-12-19 DIAGNOSIS — Z13820 Encounter for screening for osteoporosis: Secondary | ICD-10-CM | POA: Insufficient documentation

## 2018-12-19 DIAGNOSIS — Z79899 Other long term (current) drug therapy: Secondary | ICD-10-CM | POA: Insufficient documentation

## 2018-12-19 DIAGNOSIS — Z853 Personal history of malignant neoplasm of breast: Secondary | ICD-10-CM | POA: Insufficient documentation

## 2018-12-19 DIAGNOSIS — Z1231 Encounter for screening mammogram for malignant neoplasm of breast: Secondary | ICD-10-CM | POA: Insufficient documentation

## 2018-12-19 DIAGNOSIS — Z9221 Personal history of antineoplastic chemotherapy: Secondary | ICD-10-CM | POA: Insufficient documentation

## 2018-12-19 DIAGNOSIS — Z801 Family history of malignant neoplasm of trachea, bronchus and lung: Secondary | ICD-10-CM | POA: Insufficient documentation

## 2018-12-19 DIAGNOSIS — Z800 Family history of malignant neoplasm of digestive organs: Secondary | ICD-10-CM | POA: Insufficient documentation

## 2018-12-19 DIAGNOSIS — Z923 Personal history of irradiation: Secondary | ICD-10-CM | POA: Insufficient documentation

## 2018-12-19 DIAGNOSIS — Z8249 Family history of ischemic heart disease and other diseases of the circulatory system: Secondary | ICD-10-CM | POA: Insufficient documentation

## 2018-12-19 DIAGNOSIS — Z9012 Acquired absence of left breast and nipple: Secondary | ICD-10-CM | POA: Insufficient documentation

## 2018-12-19 DIAGNOSIS — Z1501 Genetic susceptibility to malignant neoplasm of breast: Secondary | ICD-10-CM | POA: Insufficient documentation

## 2018-12-19 DIAGNOSIS — E785 Hyperlipidemia, unspecified: Secondary | ICD-10-CM | POA: Insufficient documentation

## 2018-12-19 DIAGNOSIS — Z171 Estrogen receptor negative status [ER-]: Secondary | ICD-10-CM | POA: Insufficient documentation

## 2018-12-19 DIAGNOSIS — Z7984 Long term (current) use of oral hypoglycemic drugs: Secondary | ICD-10-CM | POA: Insufficient documentation

## 2018-12-19 LAB — CBC WITH DIFFERENTIAL/PLATELET
Abs Immature Granulocytes: 0.01 10*3/uL (ref 0.00–0.07)
Basophils Absolute: 0 10*3/uL (ref 0.0–0.1)
Basophils Relative: 0 %
Eosinophils Absolute: 0.1 10*3/uL (ref 0.0–0.5)
Eosinophils Relative: 1 %
HCT: 40.2 % (ref 36.0–46.0)
Hemoglobin: 13.3 g/dL (ref 12.0–15.0)
Immature Granulocytes: 0 %
Lymphocytes Relative: 41 %
Lymphs Abs: 1.7 10*3/uL (ref 0.7–4.0)
MCH: 30.8 pg (ref 26.0–34.0)
MCHC: 33.1 g/dL (ref 30.0–36.0)
MCV: 93.1 fL (ref 80.0–100.0)
Monocytes Absolute: 0.3 10*3/uL (ref 0.1–1.0)
Monocytes Relative: 7 %
Neutro Abs: 2.1 10*3/uL (ref 1.7–7.7)
Neutrophils Relative %: 51 %
Platelets: 171 10*3/uL (ref 150–400)
RBC: 4.32 MIL/uL (ref 3.87–5.11)
RDW: 14.8 % (ref 11.5–15.5)
WBC: 4.2 10*3/uL (ref 4.0–10.5)
nRBC: 0 % (ref 0.0–0.2)

## 2018-12-19 LAB — COMPREHENSIVE METABOLIC PANEL
ALT: 25 U/L (ref 0–44)
AST: 21 U/L (ref 15–41)
Albumin: 4.1 g/dL (ref 3.5–5.0)
Alkaline Phosphatase: 61 U/L (ref 38–126)
Anion gap: 11 (ref 5–15)
BUN: 14 mg/dL (ref 6–20)
CO2: 22 mmol/L (ref 22–32)
Calcium: 9.1 mg/dL (ref 8.9–10.3)
Chloride: 107 mmol/L (ref 98–111)
Creatinine, Ser: 0.63 mg/dL (ref 0.44–1.00)
GFR calc Af Amer: >60 mL/min (ref >60)
GFR calc non Af Amer: >60 mL/min (ref >60)
Glucose, Bld: 106 mg/dL — ABNORMAL HIGH (ref 70–99)
Potassium: 3.8 mmol/L (ref 3.5–5.1)
Sodium: 140 mmol/L (ref 135–145)
Total Bilirubin: 0.5 mg/dL (ref 0.3–1.2)
Total Protein: 7.5 g/dL (ref 6.5–8.1)

## 2018-12-19 LAB — VITAMIN B12: Vitamin B-12: 486 pg/mL (ref 180–914)

## 2018-12-19 LAB — FOLATE: Folate: 12.1 ng/mL (ref >5.9)

## 2018-12-19 LAB — LACTATE DEHYDROGENASE: LDH: 159 U/L (ref 98–192)

## 2018-12-20 LAB — VITAMIN D 25 HYDROXY (VIT D DEFICIENCY, FRACTURES): Vit D, 25-Hydroxy: 24 ng/mL — ABNORMAL LOW (ref 30–100)

## 2018-12-22 ENCOUNTER — Encounter (HOSPITAL_COMMUNITY): Payer: Self-pay | Admitting: Nurse Practitioner

## 2018-12-22 ENCOUNTER — Inpatient Hospital Stay (HOSPITAL_BASED_OUTPATIENT_CLINIC_OR_DEPARTMENT_OTHER): Payer: PRIVATE HEALTH INSURANCE | Admitting: Nurse Practitioner

## 2018-12-22 ENCOUNTER — Other Ambulatory Visit: Payer: Self-pay

## 2018-12-22 VITALS — BP 139/76 | HR 72 | Temp 97.9°F | Resp 16 | Wt 293.1 lb

## 2018-12-22 DIAGNOSIS — Z7984 Long term (current) use of oral hypoglycemic drugs: Secondary | ICD-10-CM | POA: Diagnosis not present

## 2018-12-22 DIAGNOSIS — Z79899 Other long term (current) drug therapy: Secondary | ICD-10-CM | POA: Diagnosis not present

## 2018-12-22 DIAGNOSIS — Z8249 Family history of ischemic heart disease and other diseases of the circulatory system: Secondary | ICD-10-CM | POA: Diagnosis not present

## 2018-12-22 DIAGNOSIS — Z853 Personal history of malignant neoplasm of breast: Secondary | ICD-10-CM | POA: Diagnosis not present

## 2018-12-22 DIAGNOSIS — Z171 Estrogen receptor negative status [ER-]: Secondary | ICD-10-CM | POA: Diagnosis not present

## 2018-12-22 DIAGNOSIS — Z9221 Personal history of antineoplastic chemotherapy: Secondary | ICD-10-CM | POA: Diagnosis not present

## 2018-12-22 DIAGNOSIS — Z9012 Acquired absence of left breast and nipple: Secondary | ICD-10-CM | POA: Diagnosis not present

## 2018-12-22 DIAGNOSIS — C50912 Malignant neoplasm of unspecified site of left female breast: Secondary | ICD-10-CM | POA: Diagnosis not present

## 2018-12-22 DIAGNOSIS — Z800 Family history of malignant neoplasm of digestive organs: Secondary | ICD-10-CM | POA: Diagnosis not present

## 2018-12-22 DIAGNOSIS — Z923 Personal history of irradiation: Secondary | ICD-10-CM | POA: Diagnosis not present

## 2018-12-22 DIAGNOSIS — Z801 Family history of malignant neoplasm of trachea, bronchus and lung: Secondary | ICD-10-CM | POA: Diagnosis not present

## 2018-12-22 DIAGNOSIS — E785 Hyperlipidemia, unspecified: Secondary | ICD-10-CM | POA: Diagnosis not present

## 2018-12-22 DIAGNOSIS — E559 Vitamin D deficiency, unspecified: Secondary | ICD-10-CM

## 2018-12-22 DIAGNOSIS — Z1501 Genetic susceptibility to malignant neoplasm of breast: Secondary | ICD-10-CM | POA: Diagnosis not present

## 2018-12-22 MED ORDER — ERGOCALCIFEROL 1.25 MG (50000 UT) PO CAPS: 50000.0000 [IU] | ORAL_CAPSULE | ORAL | 4 refills | Status: DC

## 2018-12-22 NOTE — Assessment & Plan Note (Addendum)
1.  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 - Patient had a left mastectomy on 11/27/1997.  Showed a 2.3 cm primary mass with 7- lymph nodes.  BRCA 1 positive. -She was treated with AC x4 cycles followed by Taxol with 4 cycles. - Patient was diagnosed with right-sided triple negative breast cancer in 2013. - Dr. Fanny Skates performed a right lumpectomy with sentinel node biopsy on 02/13/2011.  It showed triple negative breast cancer with negative lymph nodes.  Her Ki-67 marker was 54%. -A right mastectomy was recommended at this time but she chose to have the lumpectomy instead. - She has now status post 6 cycles of carboplatin and Taxotere and status post radiation. - Her last mammogram on 12/19/2018 showed B RADS category 1 negative. - We will see her back in 1 year with a repeat yearly mammogram and labs.  2.  Bone health: -DEXA scan done on 12/19/2018 showed T score of 0.6 which is normal. -She is taking calcium and vitamin D daily. -She tries to get exercise at least 3 times a week.

## 2018-12-22 NOTE — Progress Notes (Signed)
Kathleen Reynolds, [place] [address]   CLINIC:  Medical Oncology/Hematology  PCP:  Fayrene Helper, MD [address] [place] [address] [phone]   REASON FOR VISIT: Follow-up for triple negative breast cancer  CURRENT THERAPY: Observation   CANCER STAGING: Cancer Staging Invasive ductal carcinoma of right breast, stage 1 Staging form: Breast, AJCC 6th Edition - Clinical: Stage I (T1b, N0, M0) - Signed by [facility], PA on 04/15/2011    INTERVAL HISTORY:  [name] 58 y.o. female returns for routine follow-up for triple negative breast cancer.  Patient reports she has been doing well since her last visit.  She does get fatigued at times and has problems with migraines otherwise she is doing well. Denies any nausea, vomiting, or diarrhea. Denies any new pains. Had not noticed any recent bleeding such as epistaxis, hematuria or hematochezia. Denies recent chest pain on exertion, shortness of breath on minimal exertion, pre-syncopal episodes, or palpitations. Denies any numbness or tingling in hands or feet. Denies any recent fevers, infections, or recent hospitalizations. Patient reports appetite at 100% and energy level at 0%.  She is eating well maintain her weight at this time.    REVIEW OF SYSTEMS:  Review of Systems  Constitutional: Positive for fatigue.  Respiratory: Positive for shortness of breath (With activity).   Neurological: Positive for headaches and numbness.  All other systems reviewed and are negative.    PAST MEDICAL/SURGICAL HISTORY:  Past Medical History:  Diagnosis Date  . Anemia LIFELONG   HEAVY MENSES MAR 2012 HB 13 MCV 86.7 FERRITIN  66  . Arthritis   . Breast cancer ([place])    Right  . Breast mass in female    right  . 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 Hyperlipidemia   . Left rotator cuff tear   . Migraines   . Morbid obesity ([place])   . Personal history of chemotherapy   . Personal  history of radiation therapy   . RUPTURE ROTATOR CUFF 06/08/2007   Qualifier: Diagnosis of  By: Aline Brochure MD, Dorothyann Peng    . Urticaria    Past Surgical History:  Procedure Laterality Date  . BREAST BIOPSY  02/13/2011   Procedure: BREAST BIOPSY WITH NEEDLE LOCALIZATION;  Surgeon: Adin Hector, MD;  Location: [facility];  Service: General;  Laterality: Right;  . BREAST LUMPECTOMY Right 2013  . BREAST SURGERY  11/1997   Left breast cancer-mastectomy  . BREAST SURGERY  2/13-   rt axilly bx  . COLONOSCOPY  2004 DR. SMITH   No polyp, hemorrhoids  . COLONOSCOPY  06/09/10   ANV:BTYOMA adenomas   . COLONOSCOPY N/A 08/19/2015   Procedure: COLONOSCOPY;  Surgeon: Danie Binder, MD;  [facility];  Service: Endoscopy;  Laterality: N/A;  130 - moved to 12:45 - Ginger notified pt  . COLONOSCOPY WITH PROPOFOL N/A 11/29/2018   Procedure: COLONOSCOPY WITH PROPOFOL;  Surgeon: Danie Binder, MD;  [facility];  Service: Endoscopy;  Laterality: N/A;  7:30am  . DILATION AND CURETTAGE OF UTERUS    . DILATION AND CURETTAGE OF UTERUS  05/2002  . hystersonogram  2009  . MASTECTOMY  1999   left breast cancer   . POLYPECTOMY  11/29/2018   Procedure: POLYPECTOMY;  Surgeon: Danie Binder, MD;  [facility];  Service: Endoscopy;;  colon  . PORT-A-CATH REMOVAL  10/20/2011   Procedure: MINOR REMOVAL PORT-A-CATH;  Surgeon:  Adin Hector, MD;  Location: [facility];  Service: General;  Laterality: N/A;  Porta-cath removal  left  . PORTACATH PLACEMENT  03/27/2011   Procedure: INSERTION PORT-A-CATH;  Surgeon: Adin Hector, MD;  Location: [facility];  Service: General;  Laterality: Left;  insert port a cath     SOCIAL HISTORY:  Social History   Socioeconomic History  . Marital status: Single    Spouse name: Not on file  . Number of children: Not on file  . Years of education: Not on file  . Highest education level: Not on file   Occupational History  . Not on file  Social Needs  . Financial resource strain: Not on file  . Food insecurity    Worry: Not on file    Inability: Not on file  . Transportation needs    Medical: Not on file    Non-medical: Not on file  Tobacco Use  . Smoking status: Never Smoker  . Smokeless tobacco: Never Used  Substance and Sexual Activity  . Alcohol use: Yes    Alcohol/week: 2.0 standard drinks    Types: 2 Standard drinks or equivalent per week    Comment: occasional;   . Drug use: No  . Sexual activity: Yes    Birth control/protection: None  Lifestyle  . Physical activity    Days per week: Not on file    Minutes per session: Not on file  . Stress: Not on file  Relationships  . Social Herbalist on phone: Not on file    Gets together: Not on file    Attends religious service: Not on file    Active member of club or organization: Not on file    Attends meetings of clubs or organizations: Not on file    Relationship status: Not on file  . Intimate partner violence    Fear of current or ex partner: Not on file    Emotionally abused: Not on file    Physically abused: Not on file    Forced sexual activity: Not on file  Other Topics Concern  . Not on file  Social History Narrative   Single, 1 kid-58 yo female-lives with mom   Employed as a CNA   Rare Etoh.   No tobacco products    FAMILY HISTORY:  Family History  Problem Relation Age of Onset  . Hypertension Mother   . Cancer Father        lung cancer  . Diabetes Sister   . Cancer Brother        colon cancer  . Heart disease Sister        heart attack  . Colon polyps Sister   . Asthma Sister   . Eczema Sister   . Allergic rhinitis Neg Hx   . Urticaria Neg Hx     CURRENT MEDICATIONS:  Outpatient Encounter Medications as of 12/22/2018  Medication Sig  . acetaminophen (TYLENOL) 500 MG tablet Take 1,000 mg by mouth every 6 (six) hours as needed for mild pain or moderate pain.  . Ascorbic Acid  (VITAMIN C) 1000 MG tablet Take 1,000 mg by mouth daily.  . butalbital-acetaminophen-caffeine (FIORICET, ESGIC) 50-325-40 MG tablet Take one tablet two times daily , as needed, for headache (Patient taking differently: Take 1 tablet by mouth every 6 (six) hours as needed for headache. Take one tablet two times daily , as needed, for headache)  . Cholecalciferol (VITAMIN D PO) Take  1,000 mcg by mouth daily.   . ergocalciferol (VITAMIN D2) 1.25 MG (50000 UT) capsule Take 1 capsule (50,000 Units total) by mouth once a week.  . meclizine (ANTIVERT) 25 MG tablet Take 1 tablet (25 mg total) by mouth 3 (three) times daily as needed for dizziness.  . metFORMIN (GLUCOPHAGE) 500 MG tablet TAKE 1 TABLET BY MOUTH TWICE A DAY WITH FOOD  . Multiple Vitamin (MULITIVITAMIN WITH MINERALS) TABS Take 1 tablet by mouth daily.   No facility-administered encounter medications on file as of 12/22/2018.     ALLERGIES:  Allergies  Allergen Reactions  . Nsaids Anaphylaxis    Ibuprofen required hospitalization in 2016 due to ibuprofen  . Ibuprofen Itching     PHYSICAL EXAM:  ECOG Performance status: 1  Vitals:   12/22/18 1304  BP: 139/76  Pulse: 72  Resp: 16  Temp: 97.9 F (36.6 C)  SpO2: 98%   Filed Weights   12/22/18 1304  Weight: 293 lb 1.6 oz (132.9 kg)    Physical Exam Constitutional:      Appearance: Normal appearance. She is normal weight.  Cardiovascular:     Rate and Rhythm: Normal rate and regular rhythm.     Heart sounds: Normal heart sounds.  Pulmonary:     Effort: Pulmonary effort is normal.     Breath sounds: Normal breath sounds.  Abdominal:     General: Bowel sounds are normal.     Palpations: Abdomen is soft.  Musculoskeletal: Normal range of motion.  Skin:    General: Skin is warm.  Neurological:     Mental Status: She is alert and oriented to person, place, and time. Mental status is at baseline.  Psychiatric:        Mood and Affect: Mood normal.        Behavior:  Behavior normal.        Thought Content: Thought content normal.        Judgment: Judgment normal.      LABORATORY DATA:  I have reviewed the labs as listed.  CBC    Component Value Date/Time   WBC 4.2 12/19/2018 0913   RBC 4.32 12/19/2018 0913   HGB 13.3 12/19/2018 0913   HCT 40.2 12/19/2018 0913   PLT 171 12/19/2018 0913   MCV 93.1 12/19/2018 0913   MCH 30.8 12/19/2018 0913   MCHC 33.1 12/19/2018 0913   RDW 14.8 12/19/2018 0913   LYMPHSABS 1.7 12/19/2018 0913   MONOABS 0.3 12/19/2018 0913   EOSABS 0.1 12/19/2018 0913   BASOSABS 0.0 12/19/2018 0913   CMP Latest Ref Rng & Units 12/19/2018 06/07/2018 05/24/2018  Glucose 70 - 99 mg/dL 106(H) 102(H) 104(H)  BUN 6 - 20 mg/dL 14 17 18   Creatinine 0.44 - 1.00 mg/dL 0.63 0.74 0.78  Sodium 135 - 145 mmol/L 140 139 139  Potassium 3.5 - 5.1 mmol/L 3.8 4.0 3.8  Chloride 98 - 111 mmol/L 107 106 108  CO2 22 - 32 mmol/L 22 24 22   Calcium 8.9 - 10.3 mg/dL 9.1 9.3 9.1  Total Protein 6.5 - 8.1 g/dL 7.5 - 7.3  Total Bilirubin 0.3 - 1.2 mg/dL 0.5 - 0.6  Alkaline Phos 38 - 126 U/L 61 - 61  AST 15 - 41 U/L 21 - 19  ALT 0 - 44 U/L 25 - 21    DIAGNOSTIC IMAGING:  I have independently reviewed the mammogram and DEXA scan and discussed with the patient.   I personally performed a face-to-face visit.  All questions  were answered to patient's stated satisfaction. Encouraged patient to call with any new concerns or questions before his next visit to the cancer center and we can certain see him sooner, if needed.     ASSESSMENT & PLAN:   Breast cancer, left breast ([place]) 1.  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 - Patient had a left mastectomy on 11/27/1997.  Showed a 2.3 cm primary mass with 7- lymph nodes.  BRCA 1 positive. -She was treated with AC x4 cycles followed by Taxol with 4 cycles. - Patient was diagnosed with right-sided triple negative breast  cancer in 2013. - Dr. Fanny Skates performed a right lumpectomy with sentinel node biopsy on 02/13/2011.  It showed triple negative breast cancer with negative lymph nodes.  Her Ki-67 marker was 54%. -A right mastectomy was recommended at this time but she chose to have the lumpectomy instead. - She has now status post 6 cycles of carboplatin and Taxotere and status post radiation. - Her last mammogram on 12/19/2018 showed B RADS category 1 negative. - We will see her back in 1 year with a repeat yearly mammogram and labs.  2.  Bone health: -DEXA scan done on 12/19/2018 showed T score of 0.6 which is normal. -She is taking calcium and vitamin D daily. -She tries to get exercise at least 3 times a week.      Orders placed this encounter:  Orders Placed This Encounter  Procedures  . MM Digital Screening Unilat R  . Lactate dehydrogenase  . CBC with Differential/Platelet  . Comprehensive metabolic panel  . Vitamin B12  . Vitamin D 25 hydroxy     Francene Finders, FNP-C [facility] [phone]

## 2018-12-22 NOTE — Patient Instructions (Addendum)
[place] at [facility]  Discharge Instructions: Follow up in 1 year with labs   You saw Francene Finders, NP, today. _______________________________________________________________  Thank you for choosing [place] at [facility] to provide your oncology and hematology care.  To afford each patient quality time with our providers, please arrive at least 15 minutes before your scheduled appointment.  You need to re-schedule your appointment if you arrive 10 or more minutes late.  We strive to give you quality time with our providers, and arriving late affects you and other patients whose appointments are after yours.  Also, if you no show three or more times for appointments you may be dismissed from the clinic.  Again, thank you for choosing [place] at [place] hope is that these requests will allow you access to exceptional care and in a timely manner. _______________________________________________________________  If you have questions after your visit, please contact our office at [phone] between the hours of 8:30 a.m. and 5:00 p.m. Voicemails left after 4:30 p.m. will not be returned until the following business day. _______________________________________________________________  For prescription refill requests, have your pharmacy contact our office. _______________________________________________________________  Recommendations made by the consultant and any test results will be sent to your referring physician. _______________________________________________________________

## 2019-01-09 ENCOUNTER — Other Ambulatory Visit: Payer: Self-pay | Admitting: Family Medicine

## 2019-01-13 LAB — CBC
HCT: 39.9 % (ref 35.0–45.0)
Hemoglobin: 13.2 g/dL (ref 11.7–15.5)
MCH: 30.6 pg (ref 27.0–33.0)
MCHC: 33.1 g/dL (ref 32.0–36.0)
MCV: 92.4 fL (ref 80.0–100.0)
MPV: 11.7 fL (ref 7.5–12.5)
Platelets: 236 10*3/uL (ref 140–400)
RBC: 4.32 10*6/uL (ref 3.80–5.10)
RDW: 14 % (ref 11.0–15.0)
WBC: 5.2 10*3/uL (ref 3.8–10.8)

## 2019-01-13 LAB — COMPLETE METABOLIC PANEL WITH GFR
AG Ratio: 1.5 (calc) (ref 1.0–2.5)
ALT: 17 U/L (ref 6–29)
AST: 14 U/L (ref 10–35)
Albumin: 4.2 g/dL (ref 3.6–5.1)
Alkaline phosphatase (APISO): 63 U/L (ref 37–153)
BUN: 17 mg/dL (ref 7–25)
CO2: 22 mmol/L (ref 20–32)
Calcium: 9.2 mg/dL (ref 8.6–10.4)
Chloride: 107 mmol/L (ref 98–110)
Creat: 0.83 mg/dL (ref 0.50–1.05)
GFR, Est African American: 90 mL/min/{1.73_m2} (ref >=60)
GFR, Est Non African American: 78 mL/min/{1.73_m2} (ref >=60)
Globulin: 2.8 g/dL (calc) (ref 1.9–3.7)
Glucose, Bld: 104 mg/dL — ABNORMAL HIGH (ref 65–99)
Potassium: 4.3 mmol/L (ref 3.5–5.3)
Sodium: 139 mmol/L (ref 135–146)
Total Bilirubin: 0.3 mg/dL (ref 0.2–1.2)
Total Protein: 7 g/dL (ref 6.1–8.1)

## 2019-01-13 LAB — LIPID PANEL
Cholesterol: 211 mg/dL — ABNORMAL HIGH (ref <200)
HDL: 50 mg/dL (ref >=50)
LDL Cholesterol (Calc): 134 mg/dL (calc) — ABNORMAL HIGH
Non-HDL Cholesterol (Calc): 161 mg/dL (calc) — ABNORMAL HIGH (ref <130)
Total CHOL/HDL Ratio: 4.2 (calc) (ref <5.0)
Triglycerides: 144 mg/dL (ref <150)

## 2019-01-13 LAB — HEMOGLOBIN A1C
Hgb A1c MFr Bld: 6.1 % of total Hgb — ABNORMAL HIGH (ref <5.7)
Mean Plasma Glucose: 128 (calc)
eAG (mmol/L): 7.1 (calc)

## 2019-01-18 ENCOUNTER — Other Ambulatory Visit: Payer: Self-pay

## 2019-01-18 ENCOUNTER — Other Ambulatory Visit: Payer: Self-pay | Admitting: Family Medicine

## 2019-01-18 ENCOUNTER — Ambulatory Visit (INDEPENDENT_AMBULATORY_CARE_PROVIDER_SITE_OTHER): Payer: PRIVATE HEALTH INSURANCE | Admitting: Family Medicine

## 2019-01-18 VITALS — BP 130/70 | Ht 64.0 in | Wt 289.0 lb

## 2019-01-18 DIAGNOSIS — Z6841 Body Mass Index (BMI) 40.0 and over, adult: Secondary | ICD-10-CM

## 2019-01-18 DIAGNOSIS — R7303 Prediabetes: Secondary | ICD-10-CM | POA: Diagnosis not present

## 2019-01-18 DIAGNOSIS — E785 Hyperlipidemia, unspecified: Secondary | ICD-10-CM | POA: Diagnosis not present

## 2019-01-18 DIAGNOSIS — Z86010 Personal history of colonic polyps: Secondary | ICD-10-CM

## 2019-01-18 MED ORDER — ROSUVASTATIN CALCIUM 5 MG PO TABS: 5.0000 mg | ORAL_TABLET | Freq: Every day | ORAL | 1 refills | Status: DC

## 2019-01-18 NOTE — Patient Instructions (Signed)
F/U in office end May, call if you need me before  Fasting lipid, chem 7 and EGFR, , TSH, vit D, HBa1C 1 week before next visit  Think about what you will eat, plan ahead. Choose " clean, green, fresh or frozen" over canned, processed or packaged foods which are more sugary, salty and fatty. 70 to 75% of food eaten should be vegetables and fruit. Three meals at set times with snacks allowed between meals, but they must be fruit or vegetables. Aim to eat over a 12 hour period , example 7 am to 7 pm, and STOP after  your last meal of the day. Drink water,generally about 64 ounces per day, no other drink is as healthy. Fruit juice is best enjoyed in a healthy way, by EATING the fruit.  It is important that you exercise regularly at least 30 minutes 5 times a week. If you develop chest pain, have severe difficulty breathing, or feel very tired, stop exercising immediately and seek medical attention    Thanks for choosing [facility], we consider it a privelige to serve you.

## 2019-01-18 NOTE — Progress Notes (Signed)
Virtual Visit via Telephone Note  I connected with Kathleen Reynolds on 01/18/19 at  9:20 AM EST by telephone and verified that I am speaking with the correct person using two identifiers.  Location: Patient: home Provider: home   I discussed the limitations, risks, security and privacy concerns of performing an evaluation and management service by telephone and the availability of in person appointments. I also discussed with the patient that there may be a patient responsible charge related to this service. The patient expressed understanding and agreed to proceed.   History of Present Illness:   f/U chronic problems, review of labs, medication update and preventive care update Denies recent fever or chills. Denies sinus pressure, nasal congestion, ear pain or sore throat. Denies chest congestion, productive cough or wheezing. Denies chest pains, palpitations and leg swelling Denies abdominal pain, nausea, vomiting,diarrhea or constipation.   Denies dysuria, frequency, hesitancy or incontinence. C/o  joint pain, swelling and limitation in mobility. Denies headaches, seizures, numbness, or tingling. Denies depression, anxiety or insomnia. Denies skin break down or rash.     Observations/Objective: BP 130/70   Ht 5\' 4"  (1.626 m)   Wt 289 lb (131.1 kg)   LMP 03/27/2011   BMI 49.61 kg/m  Good communication with no confusion and intact memory. Alert and oriented x 3 No signs of respiratory distress during speech    Assessment and Plan:  Prediabetes Patient educated about the importance of limiting  Carbohydrate intake , the need to commit to daily physical activity for a minimum of 30 minutes , and to commit weight loss. The fact that changes in all these areas will reduce or eliminate all together the development of diabetes is stressed.   Diabetic Labs Latest Ref Rng & Units 01/12/2019 12/19/2018 06/07/2018 05/24/2018 11/16/2017  HbA1c <5.7 % of total Hgb 6.1(H) - 6.1(H)  - 6.0(H)  Chol <200 mg/dL 211(H) - 219(H) - 216(H)  HDL > OR = 50 mg/dL 50 - 51 - 55  Calc LDL mg/dL (calc) 134(H) - 145(H) - 133(H)  Triglycerides <150 mg/dL 144 - 110 - 149  Creatinine 0.50 - 1.05 mg/dL 0.83 0.63 0.74 0.78 0.66   BP/Weight 01/18/2019 12/22/2018 11/29/2018 10/19/2018 09/06/2018 05/31/2018 123456  Systolic BP [id number] - 123456 0000000 123456  Diastolic BP 70 76 74 - 83 75 60  Wt. (Lbs) 289 293.1 289 285 292.8 288.6 288  BMI 49.61 50.31 49.61 48.92 50.26 51.12 51.02   No flowsheet data found.  Unchanged, continue to work on lifestyle change to improve, no change in medication  Obesity, morbid, BMI 40.0-49.9 ([place])  Patient re-educated about  the importance of commitment to a  minimum of 150 minutes of exercise per week as able.  The importance of healthy food choices with portion control discussed, as well as eating regularly and within a 12 hour window most days. The need to choose "clean , green" food 50 to 75% of the time is discussed, as well as to make water the primary drink and set a goal of 64 ounces water daily.    Weight /BMI 01/18/2019 12/22/2018 11/29/2018  WEIGHT 289 lb 293 lb 1.6 oz 289 lb  HEIGHT 5\' 4"  - 5\' 4"   BMI 49.61 kg/m2 50.31 kg/m2 49.61 kg/m2      Hyperlipidemia LDL goal <100 Hyperlipidemia:Low fat diet discussed and encouraged.   Lipid Panel  Lab Results  Component Value Date   CHOL 211 (H) 01/12/2019   HDL 50 01/12/2019   LDLCALC 134 (H)  01/12/2019   TRIG 144 01/12/2019   CHOLHDL 4.2 01/12/2019   Commit to crestor daily and lower fat intake  Updated lab needed at/ before next visit.     History of colonic polyps Tubular adenomas in 11/2018   Follow Up Instructions:    I discussed the assessment and treatment plan with the patient. The patient was provided an opportunity to ask questions and all were answered. The patient agreed with the plan and demonstrated an understanding of the instructions.   The patient was advised to  call back or seek an in-person evaluation if the symptoms worsen or if the condition fails to improve as anticipated.  I provided 15 minutes of non-face-to-face time during this encounter.   [name], MD

## 2019-01-19 ENCOUNTER — Other Ambulatory Visit: Payer: PRIVATE HEALTH INSURANCE

## 2019-01-22 ENCOUNTER — Encounter: Payer: Self-pay | Admitting: Family Medicine

## 2019-01-22 NOTE — Assessment & Plan Note (Signed)
  Patient re-educated about  the importance of commitment to a  minimum of 150 minutes of exercise per week as able.  The importance of healthy food choices with portion control discussed, as well as eating regularly and within a 12 hour window most days. The need to choose "clean , green" food 50 to 75% of the time is discussed, as well as to make water the primary drink and set a goal of 64 ounces water daily.    Weight /BMI 01/18/2019 12/22/2018 11/29/2018  WEIGHT 289 lb 293 lb 1.6 oz 289 lb  HEIGHT 5\' 4"  - 5\' 4"   BMI 49.61 kg/m2 50.31 kg/m2 49.61 kg/m2

## 2019-01-22 NOTE — Assessment & Plan Note (Signed)
Patient educated about the importance of limiting  Carbohydrate intake , the need to commit to daily physical activity for a minimum of 30 minutes , and to commit weight loss. The fact that changes in all these areas will reduce or eliminate all together the development of diabetes is stressed.   Diabetic Labs Latest Ref Rng & Units 01/12/2019 12/19/2018 06/07/2018 05/24/2018 11/16/2017  HbA1c <5.7 % of total Hgb 6.1(H) - 6.1(H) - 6.0(H)  Chol <200 mg/dL 211(H) - 219(H) - 216(H)  HDL > OR = 50 mg/dL 50 - 51 - 55  Calc LDL mg/dL (calc) 134(H) - 145(H) - 133(H)  Triglycerides <150 mg/dL 144 - 110 - 149  Creatinine 0.50 - 1.05 mg/dL 0.83 0.63 0.74 0.78 0.66   BP/Weight 01/18/2019 12/22/2018 11/29/2018 10/19/2018 09/06/2018 05/31/2018 123456  Systolic BP [id number] - 123456 0000000 123456  Diastolic BP 70 76 74 - 83 75 60  Wt. (Lbs) 289 293.1 289 285 292.8 288.6 288  BMI 49.61 50.31 49.61 48.92 50.26 51.12 51.02   No flowsheet data found.  Unchanged, continue to work on lifestyle change to improve, no change in medication

## 2019-01-22 NOTE — Assessment & Plan Note (Signed)
Tubular adenomas in 11/2018

## 2019-01-22 NOTE — Assessment & Plan Note (Signed)
Hyperlipidemia:Low fat diet discussed and encouraged.   Lipid Panel  Lab Results  Component Value Date   CHOL 211 (H) 01/12/2019   HDL 50 01/12/2019   LDLCALC 134 (H) 01/12/2019   TRIG 144 01/12/2019   CHOLHDL 4.2 01/12/2019   Commit to crestor daily and lower fat intake  Updated lab needed at/ before next visit.

## 2019-02-13 ENCOUNTER — Other Ambulatory Visit: Payer: Self-pay

## 2019-02-13 DIAGNOSIS — E785 Hyperlipidemia, unspecified: Secondary | ICD-10-CM

## 2019-02-13 MED ORDER — ROSUVASTATIN CALCIUM 5 MG PO TABS: 5.0000 mg | ORAL_TABLET | Freq: Every day | ORAL | 1 refills | Status: DC

## 2019-04-17 ENCOUNTER — Encounter: Payer: Self-pay | Admitting: Gastroenterology

## 2019-06-13 ENCOUNTER — Encounter: Payer: Self-pay | Admitting: Gastroenterology

## 2019-06-13 NOTE — Progress Notes (Signed)
Primary Care Physician: Fayrene Helper, MD  Primary Gastroenterologist:  Formerly Barney Drain, MD  Chief Complaint  Patient presents with  . history polyps  . Follow-up    dioing okay    HPI: Kathleen Reynolds is a 59 y.o. female here for follow up. Seen back in 11/2018 at time of surveillance colonoscopy. She had five simple adenomas removed, diverticulosis, hemorrhoids. Next colonoscopy planned for 3 years.   Patient is doing well.  Denies any issues with her hemorrhoids.  Bowel movements are regular.  No blood in the stool or melena.  No abdominal pain.  No upper GI symptoms.  Current Outpatient Medications  Medication Sig Dispense Refill  . acetaminophen (TYLENOL) 500 MG tablet Take 1,000 mg by mouth every 6 (six) hours as needed for mild pain or moderate pain.    . Ascorbic Acid (VITAMIN C) 1000 MG tablet Take 1,000 mg by mouth daily.    . butalbital-acetaminophen-caffeine (FIORICET, ESGIC) 50-325-40 MG tablet Take one tablet two times daily , as needed, for headache (Patient taking differently: Take 1 tablet by mouth every 6 (six) hours as needed for headache. Take one tablet two times daily , as needed, for headache) 20 tablet 2  . ergocalciferol (VITAMIN D2) 1.25 MG (50000 UT) capsule Take 1 capsule (50,000 Units total) by mouth once a week. 16 capsule 4  . meclizine (ANTIVERT) 25 MG tablet TAKE 1 TABLET (25 MG TOTAL) BY MOUTH 3 (THREE) TIMES DAILY AS NEEDED FOR DIZZINESS. 30 tablet 2  . metFORMIN (GLUCOPHAGE) 500 MG tablet TAKE 1 TABLET BY MOUTH TWICE A DAY WITH FOOD 180 tablet 1  . Multiple Vitamin (MULITIVITAMIN WITH MINERALS) TABS Take 1 tablet by mouth daily.    . rosuvastatin (CRESTOR) 5 MG tablet Take 1 tablet (5 mg total) by mouth daily at 6 PM. 30 tablet 1   No current facility-administered medications for this visit.    Allergies as of 06/14/2019 - Review Complete 06/14/2019  Allergen Reaction Noted  . Nsaids Anaphylaxis 02/05/2015  . Ibuprofen  Itching 07/10/2010    ROS:  General: Negative for anorexia, weight loss, fever, chills, fatigue, weakness. ENT: Negative for hoarseness, difficulty swallowing , nasal congestion. CV: Negative for chest pain, angina, palpitations, dyspnea on exertion, peripheral edema.  Respiratory: Negative for dyspnea at rest, dyspnea on exertion, cough, sputum, wheezing.  GI: See history of present illness. GU:  Negative for dysuria, hematuria, urinary incontinence, urinary frequency, nocturnal urination.  Endo: Negative for unusual weight change.    Physical Examination:   BP (!) 141/76   Pulse 63   Temp (!) 97 F (36.1 C) (Oral)   Ht 5\' 4"  (1.626 m)   Wt 294 lb 9.6 oz (133.6 kg)   LMP 03/27/2011   BMI 50.57 kg/m   General: Well-nourished, well-developed in no acute distress.  Eyes: No icterus. Mouth: masked  Extremities: No lower extremity edema. No clubbing or deformities. Neuro: Alert and oriented x 4    Psych: Alert and cooperative, normal mood and affect.  Labs:  Lab Results  Component Value Date   CREATININE 0.83 01/12/2019   BUN 17 01/12/2019   NA 139 01/12/2019   K 4.3 01/12/2019   CL 107 01/12/2019   CO2 22 01/12/2019   Lab Results  Component Value Date   WBC 5.2 01/12/2019   HGB 13.2 01/12/2019   HCT 39.9 01/12/2019   MCV 92.4 01/12/2019   PLT 236 01/12/2019   Lab Results  Component Value Date  ALT 17 01/12/2019   AST 14 01/12/2019   ALKPHOS 61 12/19/2018   BILITOT 0.3 01/12/2019    Imaging Studies: No results found.

## 2019-06-14 ENCOUNTER — Other Ambulatory Visit: Payer: Self-pay

## 2019-06-14 ENCOUNTER — Encounter: Payer: Self-pay | Admitting: Gastroenterology

## 2019-06-14 ENCOUNTER — Ambulatory Visit (INDEPENDENT_AMBULATORY_CARE_PROVIDER_SITE_OTHER): Payer: 59 | Admitting: Gastroenterology

## 2019-06-14 DIAGNOSIS — Z86010 Personal history of colonic polyps: Secondary | ICD-10-CM

## 2019-06-14 DIAGNOSIS — K649 Unspecified hemorrhoids: Secondary | ICD-10-CM | POA: Diagnosis not present

## 2019-06-14 NOTE — Progress Notes (Signed)
Cc'ed to pcp °

## 2019-06-14 NOTE — Assessment & Plan Note (Signed)
History of 5 simple adenomas removed, colonoscopy in October 2020.  Family history significant for colon cancer, brother before the age of 39.  Patient's next colonoscopy planned for 3 years given personal history of multiple colonic adenomas.  Patient was updated on current guidelines for increased risk colon cancer screening/surveillance.  Return to the office as needed.

## 2019-06-14 NOTE — Patient Instructions (Signed)
1. Your next colonoscopy will be due in 11/2021. We will send you a reminder letter closer to the time you are due.  2. Your children and siblings need to have their first colonoscopy by age 59 and then every five years thereafter, based on current guidelines.

## 2019-06-14 NOTE — Assessment & Plan Note (Signed)
Currently asymptomatic.  We will continue to monitor.  Return to the office as needed.

## 2019-06-19 ENCOUNTER — Other Ambulatory Visit: Payer: Self-pay | Admitting: Family Medicine

## 2019-06-29 ENCOUNTER — Ambulatory Visit: Payer: PRIVATE HEALTH INSURANCE | Admitting: Family Medicine

## 2019-06-29 ENCOUNTER — Other Ambulatory Visit: Payer: Self-pay | Admitting: *Deleted

## 2019-06-29 ENCOUNTER — Telehealth: Payer: Self-pay

## 2019-06-29 DIAGNOSIS — E559 Vitamin D deficiency, unspecified: Secondary | ICD-10-CM

## 2019-06-29 DIAGNOSIS — I10 Essential (primary) hypertension: Secondary | ICD-10-CM

## 2019-06-29 DIAGNOSIS — E785 Hyperlipidemia, unspecified: Secondary | ICD-10-CM

## 2019-06-29 NOTE — Telephone Encounter (Signed)
Please send LABS To [facility]    Per AVS-- Fasting lipid, chem 7 and EGFR, , TSH, vit D, HBa1C 1 week before next visit

## 2019-06-29 NOTE — Telephone Encounter (Signed)
Labs ordered a1c can be done when she comes in for appt

## 2019-07-06 ENCOUNTER — Ambulatory Visit: Payer: 59 | Admitting: Family Medicine

## 2019-07-12 LAB — BASIC METABOLIC PANEL WITH GFR
BUN: 14 mg/dL (ref 7–25)
CO2: 23 mmol/L (ref 20–32)
Calcium: 9.2 mg/dL (ref 8.6–10.4)
Chloride: 107 mmol/L (ref 98–110)
Creat: 0.69 mg/dL (ref 0.50–1.05)
GFR, Est African American: 111 mL/min/{1.73_m2} (ref >=60)
GFR, Est Non African American: 96 mL/min/{1.73_m2} (ref >=60)
Glucose, Bld: 100 mg/dL — ABNORMAL HIGH (ref 65–99)
Potassium: 4.2 mmol/L (ref 3.5–5.3)
Sodium: 140 mmol/L (ref 135–146)

## 2019-07-12 LAB — LIPID PANEL
Cholesterol: 208 mg/dL — ABNORMAL HIGH (ref <200)
HDL: 55 mg/dL (ref >=50)
LDL Cholesterol (Calc): 128 mg/dL (calc) — ABNORMAL HIGH
Non-HDL Cholesterol (Calc): 153 mg/dL (calc) — ABNORMAL HIGH (ref <130)
Total CHOL/HDL Ratio: 3.8 (calc) (ref <5.0)
Triglycerides: 132 mg/dL (ref <150)

## 2019-07-12 LAB — TSH: TSH: 3.46 mIU/L (ref 0.40–4.50)

## 2019-07-12 LAB — VITAMIN D 25 HYDROXY (VIT D DEFICIENCY, FRACTURES): Vit D, 25-Hydroxy: 33 ng/mL (ref 30–100)

## 2019-07-18 ENCOUNTER — Other Ambulatory Visit: Payer: Self-pay

## 2019-07-18 ENCOUNTER — Ambulatory Visit (INDEPENDENT_AMBULATORY_CARE_PROVIDER_SITE_OTHER): Payer: 59 | Admitting: Family Medicine

## 2019-07-18 ENCOUNTER — Encounter: Payer: Self-pay | Admitting: Family Medicine

## 2019-07-18 VITALS — BP 120/74 | HR 79 | Temp 97.2°F | Resp 15 | Ht 64.0 in | Wt 293.0 lb

## 2019-07-18 DIAGNOSIS — R7303 Prediabetes: Secondary | ICD-10-CM | POA: Diagnosis not present

## 2019-07-18 DIAGNOSIS — E785 Hyperlipidemia, unspecified: Secondary | ICD-10-CM

## 2019-07-18 DIAGNOSIS — G4489 Other headache syndrome: Secondary | ICD-10-CM | POA: Diagnosis not present

## 2019-07-18 DIAGNOSIS — G8929 Other chronic pain: Secondary | ICD-10-CM

## 2019-07-18 DIAGNOSIS — M25561 Pain in right knee: Secondary | ICD-10-CM

## 2019-07-18 DIAGNOSIS — M1711 Unilateral primary osteoarthritis, right knee: Secondary | ICD-10-CM | POA: Diagnosis not present

## 2019-07-18 LAB — POCT GLYCOSYLATED HEMOGLOBIN (HGB A1C): Hemoglobin A1C: 5.9 % — AB (ref 4.0–5.6)

## 2019-07-18 MED ORDER — BUTALBITAL-APAP-CAFFEINE 50-325-40 MG PO TABS: ORAL_TABLET | ORAL | 0 refills | Status: DC

## 2019-07-18 MED ORDER — PREDNISONE 20 MG PO TABS: ORAL_TABLET | ORAL | 0 refills | Status: DC

## 2019-07-18 MED ORDER — ERGOCALCIFEROL 1.25 MG (50000 UT) PO CAPS: 50000.0000 [IU] | ORAL_CAPSULE | ORAL | 1 refills | Status: DC

## 2019-07-18 MED ORDER — METHYLPREDNISOLONE ACETATE 80 MG/ML IJ SUSP
80.0000 mg | Freq: Once | INTRAMUSCULAR | Status: AC
  Administered 2019-07-18: 80 mg via INTRAMUSCULAR

## 2019-07-18 NOTE — Assessment & Plan Note (Addendum)
Patient educated about the importance of limiting  Carbohydrate intake , the need to commit to daily physical activity for a minimum of 30 minutes , and to commit weight loss. The fact that changes in all these areas will reduce or eliminate all together the development of diabetes is stressed.   Diabetic Labs Latest Ref Rng & Units 07/11/2019 01/12/2019 12/19/2018 06/07/2018 05/24/2018  HbA1c <5.7 % of total Hgb - 6.1(H) - 6.1(H) -  Chol <200 mg/dL 208(H) 211(H) - 219(H) -  HDL > OR = 50 mg/dL 55 50 - 51 -  Calc LDL mg/dL (calc) 128(H) 134(H) - 145(H) -  Triglycerides <150 mg/dL 132 144 - 110 -  Creatinine 0.50 - 1.05 mg/dL 0.69 0.83 0.63 0.74 0.78   BP/Weight 07/18/2019 06/14/2019 01/18/2019 12/22/2018 11/29/2018 03/06/5425 0/07/2374  Systolic BP 283 151 761 607 371 - 062  Diastolic BP 74 76 70 76 74 - 83  Wt. (Lbs) 293 294.6 289 293.1 289 285 292.8  BMI 50.29 50.57 49.61 50.31 49.61 48.92 50.26   No flowsheet data found.   test in office today improved, 5.9

## 2019-07-18 NOTE — Patient Instructions (Signed)
Follow-up in office with MD second week in December call if you need me sooner.  Hemoglobin A1c in office today to reevaluate blood sugar control.  Depo-Medrol 80 mg IM in office for right knee pain and headache this is followed by a short course of oral prednisone.  Congratulations on improved cholesterol continue to follow a low-fat diet no need to take Crestor for your cholesterol this is discontinued.  Think about what you will eat, plan ahead. Choose " clean, green, fresh or frozen" over canned, processed or packaged foods which are more sugary, salty and fatty. 70 to 75% of food eaten should be vegetables and fruit. Three meals at set times with snacks allowed between meals, but they must be fruit or vegetables. Aim to eat over a 12 hour period , example 7 am to 7 pm, and STOP after  your last meal of the day. Drink water,generally about 64 ounces per day, no other drink is as healthy. Fruit juice is best enjoyed in a healthy way, by EATING the fruit. It is important that you exercise regularly at least 30 minutes 5 times a week. If you develop chest pain, have severe difficulty breathing, or feel very tired, stop exercising immediately and seek medical attention  Thanks for choosing [facility], we consider it a privelige to serve you.

## 2019-07-18 NOTE — Assessment & Plan Note (Signed)
Chronic uncontrolled pain.  Depo-Medrol 80 mg IM in the office followed by a short course of oral prednisone.  Weight loss also encouraged.

## 2019-07-18 NOTE — Assessment & Plan Note (Signed)
1 week h/o increased pain, depomedrol and prednisone and fioricet to be refilled

## 2019-07-18 NOTE — Progress Notes (Signed)
[name]     MRN: [id number]      DOB: [birth year]   HPI [name] is here for follow up and re-evaluation of chronic medical conditions, medication management and review of any available recent lab and radiology data.  Preventive health is updated, specifically  Cancer screening and Immunization.   Left knee and heel pain worse in past 3 weeks thinks that her  heel pain has been aggravated  By wearing a  specific shoe, rated at 6 Intermittent acute pain at nape of neck rated at a 4 , for past 6 months, on avg twice / month, lasts 2 to 3 days, massages the neck and notes pain on turning to the right Posterior headache form napeto crown, increased x 2 weeks, woke up with headaches last week, had to leave work last, some relief with fioricet  ROS Denies recent fever or chills. Denies sinus pressure, nasal congestion, ear pain or sore throat. Denies chest congestion, productive cough or wheezing. Denies chest pains, palpitations and leg swelling Denies abdominal pain, nausea, vomiting,diarrhea or constipation.   Denies dysuria, frequency, hesitancy or incontinence.  Denies depression, anxiety or insomnia. Denies skin break down or rash.   PE  BP 120/74   Pulse 79   Temp (!) 97.2 F (36.2 C) (Temporal)   Resp 15   Ht 5\' 4"  (1.626 m)   Wt 293 lb (132.9 kg)   LMP 03/27/2011   SpO2 96%   BMI 50.29 kg/m   Patient alert and oriented and in no cardiopulmonary distress.  HEENT: No facial asymmetry, EOMI,     Neck adequate though reduced rOM .tender over left side of neck  Chest: Clear to auscultation bilaterally.  CVS: S1, S2 no murmurs, no S3.Regular rate.  ABD: Soft non tender.   Ext: No edema  MS: Adequate ROM spine, shoulders, hips and reduced in  knees.  Skin: Intact, no ulcerations or rash noted.  Psych: Good eye contact, normal affect. Memory intact not anxious or depressed appearing.  CNS: CN 2-12 intact, power,  normal throughout.no focal deficits  noted.   Assessment & Plan  OA (osteoarthritis) of knee Chronic uncontrolled pain.  Depo-Medrol 80 mg IM in the office followed by a short course of oral prednisone.  Weight loss also encouraged.    Headache 1 week h/o increased pain, depomedrol and prednisone and fioricet to be refilled  Prediabetes Patient educated about the importance of limiting  Carbohydrate intake , the need to commit to daily physical activity for a minimum of 30 minutes , and to commit weight loss. The fact that changes in all these areas will reduce or eliminate all together the development of diabetes is stressed.   Diabetic Labs Latest Ref Rng & Units 07/11/2019 01/12/2019 12/19/2018 06/07/2018 05/24/2018  HbA1c <5.7 % of total Hgb - 6.1(H) - 6.1(H) -  Chol <200 mg/dL 208(H) 211(H) - 219(H) -  HDL > OR = 50 mg/dL 55 50 - 51 -  Calc LDL mg/dL (calc) 128(H) 134(H) - 145(H) -  Triglycerides <150 mg/dL 132 144 - 110 -  Creatinine 0.50 - 1.05 mg/dL 0.69 0.83 0.63 0.74 0.78   BP/Weight 07/18/2019 06/14/2019 01/18/2019 12/22/2018 11/29/2018 07/09/3708 07/05/6946  Systolic BP 546 270 350 093 818 - 299  Diastolic BP 74 76 70 76 74 - 83  Wt. (Lbs) 293 294.6 289 293.1 289 285 292.8  BMI 50.29 50.57 49.61 50.31 49.61 48.92 50.26   No flowsheet data found.   test in office  today improved, 5.9  Obesity, morbid, BMI 40.0-49.9 ([place])  Patient re-educated about  the importance of commitment to a  minimum of 150 minutes of exercise per week as able.  The importance of healthy food choices with portion control discussed, as well as eating regularly and within a 12 hour window most days. The need to choose "clean , green" food 50 to 75% of the time is discussed, as well as to make water the primary drink and set a goal of 64 ounces water daily.    Weight /BMI 07/18/2019 06/14/2019 01/18/2019  WEIGHT 293 lb 294 lb 9.6 oz 289 lb  HEIGHT 5\' 4"  5\' 4"  5\' 4"   BMI 50.29 kg/m2 50.57 kg/m2 49.61 kg/m2      Chronic pain of  right knee increased and uncontrolled x 3 weeks Uncontrolled. depo medrol administered IM in the office , to be followed by a short course of oral prednisone and NSAIDS.   Hyperlipidemia LDL goal <100 Hyperlipidemia:Low fat diet discussed and encouraged.   Lipid Panel  Lab Results  Component Value Date   CHOL 208 (H) 07/11/2019   HDL 55 07/11/2019   LDLCALC 128 (H) 07/11/2019   TRIG 132 07/11/2019   CHOLHDL 3.8 07/11/2019  needs to reduce fried and fatty foods, improved since last check

## 2019-07-21 ENCOUNTER — Encounter: Payer: Self-pay | Admitting: Family Medicine

## 2019-07-21 NOTE — Assessment & Plan Note (Signed)
Hyperlipidemia:Low fat diet discussed and encouraged.   Lipid Panel  Lab Results  Component Value Date   CHOL 208 (H) 07/11/2019   HDL 55 07/11/2019   LDLCALC 128 (H) 07/11/2019   TRIG 132 07/11/2019   CHOLHDL 3.8 07/11/2019  needs to reduce fried and fatty foods, improved since last check

## 2019-07-21 NOTE — Assessment & Plan Note (Signed)
  Patient re-educated about  the importance of commitment to a  minimum of 150 minutes of exercise per week as able.  The importance of healthy food choices with portion control discussed, as well as eating regularly and within a 12 hour window most days. The need to choose "clean , green" food 50 to 75% of the time is discussed, as well as to make water the primary drink and set a goal of 64 ounces water daily.    Weight /BMI 07/18/2019 06/14/2019 01/18/2019  WEIGHT 293 lb 294 lb 9.6 oz 289 lb  HEIGHT 5\' 4"  5\' 4"  5\' 4"   BMI 50.29 kg/m2 50.57 kg/m2 49.61 kg/m2

## 2019-07-21 NOTE — Assessment & Plan Note (Addendum)
increased and uncontrolled x 3 weeks Uncontrolled. depo medrol administered IM in the office , to be followed by a short course of oral prednisone and NSAIDS.

## 2019-08-07 ENCOUNTER — Encounter (HOSPITAL_COMMUNITY): Payer: Self-pay

## 2019-08-07 ENCOUNTER — Other Ambulatory Visit: Payer: Self-pay

## 2019-08-07 ENCOUNTER — Emergency Department (HOSPITAL_COMMUNITY)
Admission: EM | Admit: 2019-08-07 | Discharge: 2019-08-07 | Disposition: A | Discharge status: Home / Self Care | Payer: 59 | Attending: Emergency Medicine | Admitting: Emergency Medicine

## 2019-08-07 ENCOUNTER — Emergency Department (HOSPITAL_COMMUNITY): Payer: 59

## 2019-08-07 DIAGNOSIS — S4991XA Unspecified injury of right shoulder and upper arm, initial encounter: Secondary | ICD-10-CM | POA: Diagnosis present

## 2019-08-07 DIAGNOSIS — Z9012 Acquired absence of left breast and nipple: Secondary | ICD-10-CM | POA: Insufficient documentation

## 2019-08-07 DIAGNOSIS — S46911A Strain of unspecified muscle, fascia and tendon at shoulder and upper arm level, right arm, initial encounter: Secondary | ICD-10-CM | POA: Insufficient documentation

## 2019-08-07 DIAGNOSIS — W101XXA Fall (on)(from) sidewalk curb, initial encounter: Secondary | ICD-10-CM | POA: Insufficient documentation

## 2019-08-07 DIAGNOSIS — Y92009 Unspecified place in unspecified non-institutional (private) residence as the place of occurrence of the external cause: Secondary | ICD-10-CM | POA: Diagnosis not present

## 2019-08-07 DIAGNOSIS — S86011A Strain of right Achilles tendon, initial encounter: Secondary | ICD-10-CM | POA: Diagnosis not present

## 2019-08-07 DIAGNOSIS — Y939 Activity, unspecified: Secondary | ICD-10-CM | POA: Diagnosis not present

## 2019-08-07 DIAGNOSIS — Y999 Unspecified external cause status: Secondary | ICD-10-CM | POA: Diagnosis not present

## 2019-08-07 MED ORDER — HYDROCODONE-ACETAMINOPHEN 5-325 MG PO TABS: 1.0000 | ORAL_TABLET | ORAL | 0 refills | Status: DC | PRN

## 2019-08-07 MED ORDER — ACETAMINOPHEN 500 MG PO TABS
1000.0000 mg | ORAL_TABLET | Freq: Once | ORAL | Status: AC
  Administered 2019-08-07: 1000 mg via ORAL
  Filled 2019-08-07: qty 2

## 2019-08-07 NOTE — ED Triage Notes (Signed)
Pt reports that she stepped off curb last night and fell. Pt landed on right side. Pt reports right shoulder and right ankle pain

## 2019-08-07 NOTE — ED Provider Notes (Signed)
[facility] EMERGENCY DEPARTMENT Provider Note   CSN: [id number] Arrival date & time: 08/07/19  2355     History Chief Complaint  Patient presents with  . Fall  . Shoulder Pain  . Ankle Pain    Kathleen Reynolds is a 59 y.o. female.  Pt presents to the ED today with right ankle and right shoulder pain.  Pt said she stepped off a curb and her right knee gave out.  She fell.  Her right knee does sometimes "go out."  Pt did not hit her head or have a loc.  She is not on blood thinners.  She has been able to ambulate.  She drove here (using both feet).        Past Medical History:  Diagnosis Date  . Anemia LIFELONG   HEAVY MENSES MAR 2012 HB 13 MCV 86.7 FERRITIN  66  . Arthritis   . Breast cancer ([place])    Right  . Breast mass in female    right  . 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 Hyperlipidemia   . Left rotator cuff tear   . Migraines   . Morbid obesity ([place])   . Personal history of chemotherapy   . Personal history of radiation therapy   . RUPTURE ROTATOR CUFF 06/08/2007   Qualifier: Diagnosis of  By: Aline Brochure MD, Dorothyann Peng    . Urticaria     Patient Active Problem List   Diagnosis Date Noted  . Hx of adenomatous colonic polyps 06/14/2019  . Hemorrhoid 06/14/2019  . History of colonic polyps 09/05/2018  . Family history of colon cancer 09/05/2018  . Trigger thumb, left thumb 11/28/2017  . Breast cancer, left breast ([place]) 08/16/2015  . Sciatica of right side associated with disorder of lumbar spine 08/27/2014  . Allergic rhinitis 07/03/2014  . Vertigo, intermittent 05/20/2014  . Nonspecific elevation of levels of transaminase or lactic acid dehydrogenase (LDH) 05/09/2014  . Rotator cuff syndrome 09/19/2013  . Metabolic syndrome X 73/22/0254  . Anemia 02/18/2012  . Obesity, morbid, BMI 40.0-49.9 ([place]) 02/18/2012  . OA (osteoarthritis) of knee 12/29/2011  . Chronic pain of right knee 12/14/2011  . BRCA1 positive 07/01/2011  . Invasive ductal carcinoma of right  breast, stage 1 02/18/2011  . Prediabetes 01/22/2011  . Headache 01/01/2010  . Thyroid nodule 05/17/2009  . Hyperlipidemia LDL goal <100 02/22/2007    Past Surgical History:  Procedure Laterality Date  . BREAST BIOPSY  02/13/2011   Procedure: BREAST BIOPSY WITH NEEDLE LOCALIZATION;  Surgeon: Adin Hector, MD;  Location: [place];  Service: General;  Laterality: Right;  . BREAST LUMPECTOMY Right 2013  . BREAST SURGERY  11/1997   Left breast cancer-mastectomy  . BREAST SURGERY  2/13-   rt axilly bx  . COLONOSCOPY  2004 DR. SMITH   No polyp, hemorrhoids  . COLONOSCOPY  06/09/10   YHC:WCBJSE adenomas   . COLONOSCOPY N/A 08/19/2015   Procedure: COLONOSCOPY;  Surgeon: Danie Binder, MD;  [facility];  Service: Endoscopy;  Laterality: N/A;  130 - moved to 12:45 - Ginger notified pt  . COLONOSCOPY WITH PROPOFOL N/A 11/29/2018   Fields: Past simple adenomas removed, diverticulosis, hemorrhoids.  Next colonoscopy in 3 years.  Marland Kitchen DILATION AND CURETTAGE OF UTERUS    . DILATION AND CURETTAGE OF UTERUS  05/2002  . hystersonogram  2009  . MASTECTOMY  1999   left breast cancer   . POLYPECTOMY  11/29/2018   Procedure: POLYPECTOMY;  Surgeon: Barney Drain  L, MD;  [facility];  Service: Endoscopy;;  colon  . PORT-A-CATH REMOVAL  10/20/2011   Procedure: MINOR REMOVAL PORT-A-CATH;  Surgeon: Adin Hector, MD;  Location: [place];  Service: General;  Laterality: N/A;  Porta-cath removal  left  . PORTACATH PLACEMENT  03/27/2011   Procedure: INSERTION PORT-A-CATH;  Surgeon: Adin Hector, MD;  Location: [place];  Service: General;  Laterality: Left;  insert port a cath     OB History   No obstetric history on file.     Family History  Problem Relation Age of Onset  . Hypertension Mother   . Cancer Father        lung cancer  . Diabetes Sister   . Cancer Brother        colon cancer, younger than the age of 56  .  Heart disease Sister        heart attack  . Colon polyps Sister   . Asthma Sister   . Eczema Sister   . Allergic rhinitis Neg Hx   . Urticaria Neg Hx     Social History   Tobacco Use  . Smoking status: Never Smoker  . Smokeless tobacco: Never Used  Vaping Use  . Vaping Use: Never used  Substance Use Topics  . Alcohol use: Yes    Alcohol/week: 2.0 standard drinks    Types: 2 Standard drinks or equivalent per week    Comment: occasional;   . Drug use: No    Home Medications Prior to Admission medications   Medication Sig Start Date End Date Taking? Authorizing Provider  acetaminophen (TYLENOL) 500 MG tablet Take 1,000 mg by mouth every 6 (six) hours as needed for mild pain or moderate pain.    [provider]  Ascorbic Acid (VITAMIN C) 1000 MG tablet Take 1,000 mg by mouth daily.    [provider]  butalbital-acetaminophen-caffeine (FIORICET) [phone] MG tablet Take one tablet by mouth two times daily as needed, for headache 07/18/19   Fayrene Helper, MD  ergocalciferol (VITAMIN D2) 1.25 MG (50000 UT) capsule Take 1 capsule (50,000 Units total) by mouth once a week. One capsule once weekly 07/18/19   Fayrene Helper, MD  HYDROcodone-acetaminophen (NORCO/VICODIN) 5-325 MG tablet Take 1 tablet by mouth every 4 (four) hours as needed. 08/07/19   Isla Pence, MD  meclizine (ANTIVERT) 25 MG tablet TAKE 1 TABLET (25 MG TOTAL) BY MOUTH 3 (THREE) TIMES DAILY AS NEEDED FOR DIZZINESS. 01/10/19   Fayrene Helper, MD  metFORMIN (GLUCOPHAGE) 500 MG tablet TAKE 1 TABLET BY MOUTH TWICE A DAY WITH FOOD 06/19/19   Fayrene Helper, MD  Multiple Vitamin (MULITIVITAMIN WITH MINERALS) TABS Take 1 tablet by mouth daily.    [provider]  predniSONE (DELTASONE) 20 MG tablet Take 1 tablet by mouth 3 times daily for 2 days then take 1 tablet by mouth 2 times daily for 2 days and then take 1 tablet by mouth once daily for 2 days  then stop 07/18/19   Fayrene Helper, MD    Allergies    Nsaids and Ibuprofen  Review of Systems   Review of Systems  Musculoskeletal:       Right upper arm pain, right posterior ankle pain  All other systems reviewed and are negative.   Physical Exam Updated Vital Signs BP 130/77   Pulse 65   Temp 98 F (36.7 C)   Resp 17  Ht 5' 4"  (1.626 m)   Wt 132 kg   LMP 03/27/2011   SpO2 100%   BMI 49.95 kg/m   Physical Exam Vitals and nursing note reviewed.  Constitutional:      Appearance: Normal appearance. She is obese.  HENT:     Head: Normocephalic and atraumatic.     Right Ear: External ear normal.     Left Ear: External ear normal.     Nose: Nose normal.     Mouth/Throat:     Mouth: Mucous membranes are moist.     Pharynx: Oropharynx is clear.  Eyes:     Extraocular Movements: Extraocular movements intact.     Conjunctiva/sclera: Conjunctivae normal.     Pupils: Pupils are equal, round, and reactive to light.  Cardiovascular:     Rate and Rhythm: Normal rate and regular rhythm.     Pulses: Normal pulses.     Heart sounds: Normal heart sounds.  Pulmonary:     Effort: Pulmonary effort is normal.     Breath sounds: Normal breath sounds.  Abdominal:     General: Abdomen is flat. Bowel sounds are normal.     Palpations: Abdomen is soft.  Musculoskeletal:     Right shoulder: Decreased range of motion.       Arms:     Cervical back: Normal range of motion and neck supple.     Right ankle: Decreased range of motion.     Right Achilles Tendon: Tenderness present. No defects.       Legs:     Comments: Pt is able to plantar flex foot  Skin:    General: Skin is warm.     Capillary Refill: Capillary refill takes less than 2 seconds.  Neurological:     General: No focal deficit present.     Mental Status: She is alert and oriented to person, place, and time.  Psychiatric:        Mood and Affect: Mood normal.        Behavior: Behavior normal.     ED Results / Procedures / Treatments    Labs (all labs ordered are listed, but only abnormal results are displayed) Labs Reviewed - No data to display  EKG None  Radiology DG Ankle Complete Right  Result Date: 08/07/2019 CLINICAL DATA:  Fall, right ankle injury, pain EXAM: RIGHT ANKLE - COMPLETE 3+ VIEW COMPARISON:  None. FINDINGS: Degenerative changes of the right ankle joint with joint space loss and bony spurring. Normal alignment. No acute osseous finding or fracture. Malleoli, talus and calcaneus appear intact. Calcaneal spurring noted. Chronic calcifications of the Achilles region. IMPRESSION: Degenerative changes without acute osseous finding Electronically Signed   By: Jerilynn Mages.  Shick M.D.   On: 08/07/2019 08:56   DG Humerus Right  Result Date: 08/07/2019 CLINICAL DATA:  Fall, pain, injury EXAM: RIGHT HUMERUS - 2+ VIEW COMPARISON:  None. FINDINGS: Advanced arthropathy of the right AC joint and glenohumeral joint without malalignment or acute osseous finding. Negative for fracture. IMPRESSION: Right shoulder degenerative change.  No acute osseous finding. Electronically Signed   By: Jerilynn Mages.  Shick M.D.   On: 08/07/2019 08:55    Procedures Procedures (including critical care time)  Medications Ordered in ED Medications  acetaminophen (TYLENOL) tablet 1,000 mg (1,000 mg Oral Given 08/07/19 3500)    ED Course  I have reviewed the triage vital signs and the nursing notes.  Pertinent labs & imaging results that were available during my care of the patient were  reviewed by me and considered in my medical decision making (see chart for details).    MDM Rules/Calculators/A&P                         No fractures on xray.  Pt placed in a cam walker and in a shoulder sling.  Pt has seen Dr. Aline Brochure in the past and wants to f/u with him.  She knows to return if worse.    Final Clinical Impression(s) / ED Diagnoses Final diagnoses:  Strain of right shoulder, initial encounter  Strain of right Achilles tendon, initial encounter     Rx / DC Orders ED Discharge Orders         Ordered    HYDROcodone-acetaminophen (NORCO/VICODIN) 5-325 MG tablet  Every 4 hours PRN     Discontinue  Reprint     08/07/19 0914           Isla Pence, MD 08/07/19 [phone]

## 2019-08-08 ENCOUNTER — Encounter: Payer: Self-pay | Admitting: Orthopaedic Surgery

## 2019-08-08 ENCOUNTER — Ambulatory Visit (INDEPENDENT_AMBULATORY_CARE_PROVIDER_SITE_OTHER): Payer: 59 | Admitting: Orthopaedic Surgery

## 2019-08-08 VITALS — BP 153/91 | HR 73 | Ht 64.0 in | Wt 291.0 lb

## 2019-08-08 DIAGNOSIS — M25511 Pain in right shoulder: Secondary | ICD-10-CM | POA: Diagnosis not present

## 2019-08-08 DIAGNOSIS — S99911A Unspecified injury of right ankle, initial encounter: Secondary | ICD-10-CM

## 2019-08-08 DIAGNOSIS — Z6841 Body Mass Index (BMI) 40.0 and over, adult: Secondary | ICD-10-CM

## 2019-08-08 DIAGNOSIS — S86011A Strain of right Achilles tendon, initial encounter: Secondary | ICD-10-CM | POA: Diagnosis not present

## 2019-08-08 NOTE — Progress Notes (Signed)
Subjective:    Patient ID: Kathleen Reynolds, female    DOB: [birth year], 59 y.o.   MRN: [id number]  HPI She fell on July 4th and hurt her right ankle and right shoulder.  She had problems walking and using her right shoulder.  She went to the ER yesterday and had x-rays of the right shoulder and ankle.    I have independently reviewed and interpreted x-rays of this patient done at another site by another physician or qualified health professional.  The ER was concerned about an Achilles strain.  She has had more pain since being home.  She was given a CAM walker.  She has pain in standing.  She has pain in trying to move her foot out of the brace.  The right shoulder is tender but is slightly better.  I felt a defect in the right Achilles today and she has positive Thompson sign, decreased Achilles muscle strength and pain.  NV intact.  I am very concerned about a traumatic Achilles tendon tear.  I would like to get a MRI of the right Achilles as soon as we can.  She may need surgery.  I have explained this to her.  We will get permission from her insurance company.   Review of Systems  Constitutional: Positive for activity change.  Musculoskeletal: Positive for arthralgias, gait problem, joint swelling and myalgias.  Neurological: Positive for headaches.  All other systems reviewed and are negative.  For Review of Systems, all other systems reviewed and are negative.  The following is a summary of the past history medically, past history surgically, known current medicines, social history and family history.  This information is gathered electronically by the computer from prior information and documentation.  I review this each visit and have found including this information at this point in the chart is beneficial and informative.   Past Medical History:  Diagnosis Date   Anemia LIFELONG   HEAVY MENSES MAR 2012 HB 13 MCV 86.7 FERRITIN  66   Arthritis    Breast cancer  ([place])    Right   Breast mass in female    right   Colonic adenoma 02/05/2011   Hyperlipidemia    Left rotator cuff tear    Migraines    Morbid obesity ([place])    Personal history of chemotherapy    Personal history of radiation therapy    RUPTURE ROTATOR CUFF 06/08/2007   Qualifier: Diagnosis of  By: Aline Brochure MD, Dorothyann Peng     Urticaria     Past Surgical History:  Procedure Laterality Date   BREAST BIOPSY  02/13/2011   Procedure: BREAST BIOPSY WITH NEEDLE LOCALIZATION;  Surgeon: Adin Hector, MD;  Location: [place];  Service: General;  Laterality: Right;   BREAST LUMPECTOMY Right 2013   BREAST SURGERY  11/1997   Left breast cancer-mastectomy   BREAST SURGERY  2/13-   rt axilly bx   COLONOSCOPY  2004 DR. SMITH   No polyp, hemorrhoids   COLONOSCOPY  06/09/10   ALP:[id number] adenomas    COLONOSCOPY N/A 08/19/2015   Procedure: COLONOSCOPY;  Surgeon: Danie Binder, MD;  [facility];  Service: Endoscopy;  Laterality: N/A;  130 - moved to 12:45 - Ginger notified pt   COLONOSCOPY WITH PROPOFOL N/A 11/29/2018   Fields: Past simple adenomas removed, diverticulosis, hemorrhoids.  Next colonoscopy in 3 years.   DILATION AND CURETTAGE OF UTERUS     DILATION AND CURETTAGE OF UTERUS  05/2002  hystersonogram  2009   MASTECTOMY  1999   left breast cancer    POLYPECTOMY  11/29/2018   Procedure: POLYPECTOMY;  Surgeon: Danie Binder, MD;  [facility];  Service: Endoscopy;;  colon   PORT-A-CATH REMOVAL  10/20/2011   Procedure: MINOR REMOVAL PORT-A-CATH;  Surgeon: Adin Hector, MD;  Location: [place];  Service: General;  Laterality: N/A;  Porta-cath removal  left   PORTACATH PLACEMENT  03/27/2011   Procedure: INSERTION PORT-A-CATH;  Surgeon: Adin Hector, MD;  Location: [place];  Service: General;  Laterality: Left;  insert port a cath    Current Outpatient Medications on File Prior to Visit    Medication Sig Dispense Refill   acetaminophen (TYLENOL) 500 MG tablet Take 1,000 mg by mouth every 6 (six) hours as needed for mild pain or moderate pain.     Ascorbic Acid (VITAMIN C) 1000 MG tablet Take 1,000 mg by mouth daily.     butalbital-acetaminophen-caffeine (FIORICET) 50-325-40 MG tablet Take one tablet by mouth two times daily as needed, for headache 20 tablet 0   ergocalciferol (VITAMIN D2) 1.25 MG (50000 UT) capsule Take 1 capsule (50,000 Units total) by mouth once a week. One capsule once weekly 12 capsule 1   HYDROcodone-acetaminophen (NORCO/VICODIN) 5-325 MG tablet Take 1 tablet by mouth every 4 (four) hours as needed. 10 tablet 0   meclizine (ANTIVERT) 25 MG tablet TAKE 1 TABLET (25 MG TOTAL) BY MOUTH 3 (THREE) TIMES DAILY AS NEEDED FOR DIZZINESS. 30 tablet 2   metFORMIN (GLUCOPHAGE) 500 MG tablet TAKE 1 TABLET BY MOUTH TWICE A DAY WITH FOOD 180 tablet 1   Multiple Vitamin (MULITIVITAMIN WITH MINERALS) TABS Take 1 tablet by mouth daily.     predniSONE (DELTASONE) 20 MG tablet Take 1 tablet by mouth 3 times daily for 2 days then take 1 tablet by mouth 2 times daily for 2 days and then take 1 tablet by mouth once daily for 2 days  then stop 12 tablet 0   No current facility-administered medications on file prior to visit.    Social History   Socioeconomic History   Marital status: Single    Spouse name: Not on file   Number of children: Not on file   Years of education: Not on file   Highest education level: Not on file  Occupational History   Not on file  Tobacco Use   Smoking status: Never Smoker   Smokeless tobacco: Never Used  Vaping Use   Vaping Use: Never used  Substance and Sexual Activity   Alcohol use: Yes    Alcohol/week: 2.0 standard drinks    Types: 2 Standard drinks or equivalent per week    Comment: occasional;    Drug use: No   Sexual activity: Yes    Birth control/protection: None  Other Topics Concern   Not on file  Social  History Narrative   Single, 1 kid-59 yo female-lives with mom   Employed as a CNA   Rare Etoh.   No tobacco products   Social Determinants of Health   Financial Resource Strain:    Difficulty of Paying Living Expenses:   Food Insecurity:    Worried About Charity fundraiser in the Last Year:    Arboriculturist in the Last Year:   Transportation Needs:    Film/video editor (Medical):    Lack of Transportation (Non-Medical):   Physical Activity:    Days  of Exercise per Week:    Minutes of Exercise per Session:   Stress:    Feeling of Stress :   Social Connections:    Frequency of Communication with Friends and Family:    Frequency of Social Gatherings with Friends and Family:    Attends Religious Services:    Active Member of Clubs or Organizations:    Attends Music therapist:    Marital Status:   Intimate Partner Violence:    Fear of Current or Ex-Partner:    Emotionally Abused:    Physically Abused:    Sexually Abused:     Family History  Problem Relation Age of Onset   Hypertension Mother    Cancer Father        lung cancer   Diabetes Sister    Cancer Brother        colon cancer, younger than the age of 6   Heart disease Sister        heart attack   Colon polyps Sister    Asthma Sister    Eczema Sister    Allergic rhinitis Neg Hx    Urticaria Neg Hx     BP (!) 153/91    Pulse 73    Ht 5\' 4"  (1.626 m)    Wt 291 lb (132 kg)    LMP 03/27/2011    BMI 49.95 kg/m   Body mass index is 49.95 kg/m.     Objective:   Physical Exam Vitals and nursing note reviewed.  Constitutional:      Appearance: She is well-developed.  HENT:     Head: Normocephalic and atraumatic.  Eyes:     Conjunctiva/sclera: Conjunctivae normal.     Pupils: Pupils are equal, round, and reactive to light.  Cardiovascular:     Rate and Rhythm: Normal rate and regular rhythm.  Pulmonary:     Effort: Pulmonary effort is normal.  Abdominal:       Palpations: Abdomen is soft.  Musculoskeletal:       Arms:     Cervical back: Normal range of motion and neck supple.       Legs:  Skin:    General: Skin is warm and dry.  Neurological:     Mental Status: She is alert and oriented to person, place, and time.     Cranial Nerves: No cranial nerve deficit.     Motor: No abnormal muscle tone.     Coordination: Coordination normal.     Deep Tendon Reflexes: Reflexes are normal and symmetric. Reflexes normal.  Psychiatric:        Behavior: Behavior normal.        Thought Content: Thought content normal.        Judgment: Judgment normal.           Assessment & Plan:   Encounter Diagnoses  Name Primary?   Injury of right ankle, initial encounter Yes   Rupture of right Achilles tendon, initial encounter    Acute pain of right shoulder    Body mass index 45.0-49.9, adult ([place])    Morbid obesity ([place])    I want to get MRI as soon as possible of Achilles.  Return after done.  Consider surgery depending on scan results.  Continue the CAM walker.  Continue medicine given in ER.  Return after MRI.  Call if any problem.  Precautions discussed.   Electronically Signed Sanjuana Kava, MD 7/6/20212:45 PM

## 2019-08-14 ENCOUNTER — Telehealth: Payer: Self-pay | Admitting: Orthopaedic Surgery

## 2019-08-14 NOTE — Telephone Encounter (Signed)
Patient called asking about whether or not we had gotten approval from her insurance company for the MRI.  Please let her know where this stands  Thanks

## 2019-08-21 ENCOUNTER — Ambulatory Visit (INDEPENDENT_AMBULATORY_CARE_PROVIDER_SITE_OTHER): Payer: 59 | Admitting: Family Medicine

## 2019-08-21 ENCOUNTER — Other Ambulatory Visit: Payer: Self-pay

## 2019-08-21 ENCOUNTER — Encounter: Payer: Self-pay | Admitting: Family Medicine

## 2019-08-21 VITALS — BP 123/83 | HR 67 | Resp 16 | Ht 64.0 in | Wt 291.0 lb

## 2019-08-21 DIAGNOSIS — B028 Zoster with other complications: Secondary | ICD-10-CM

## 2019-08-21 DIAGNOSIS — R7303 Prediabetes: Secondary | ICD-10-CM | POA: Diagnosis not present

## 2019-08-21 DIAGNOSIS — B029 Zoster without complications: Secondary | ICD-10-CM | POA: Insufficient documentation

## 2019-08-21 MED ORDER — GABAPENTIN 100 MG PO CAPS: ORAL_CAPSULE | ORAL | 1 refills | Status: DC

## 2019-08-21 MED ORDER — ACYCLOVIR 400 MG PO TABS
400.0000 mg | ORAL_TABLET | Freq: Every day | ORAL | 0 refills | Status: DC
Start: 2019-08-21 — End: 2019-09-18

## 2019-08-21 NOTE — Progress Notes (Signed)
   Kathleen Reynolds     MRN: [id number]      DOB: [birth year]   HPI Kathleen Reynolds is here 1 week h/o burning stinging pain to left upper back with a rash, has h/o varicella. Denies fever or purulent drainage  ROS Denies recent fever or chills. Denies sinus pressure, nasal congestion, ear pain or sore throat. Denies chest congestion, productive cough or wheezing. Denies  palpitations and leg swelling Denies abdominal pain, nausea, vomiting,diarrhea or constipation.   Denies dysuria, frequency, hesitancy or incontinence.    PE  BP 123/83   Pulse 67   Resp 16   Ht 5\' 4"  (1.626 m)   Wt 291 lb (132 kg)   LMP 03/27/2011   SpO2 94%   BMI 49.95 kg/m   Patient alert and oriented and in no cardiopulmonary distress. Pt in pain HEENT: No facial asymmetry, EOMI,     Neck supple .  Chest: Clear to auscultation bilaterally.  CVS: S1, S2 no murmurs, no S3.Regular rate.  ABD: Soft non tender.   Ext: No edema  Skin: Intact, vesicular  rash noted.on erythematous base in dermatomal distribution on left posterior chest  Psych: Good eye contact, normal affect. Memory intact not anxious or depressed appearing.  CNS: CN 2-12 intact, power,  normal throughout.no focal deficits noted.   Assessment & Plan  Shingles rash Acute outbreak on posterior left chest, acyclovir and gaabapentin prescribed  Obesity, morbid, BMI 40.0-49.9 ([place])  Patient re-educated about  the importance of commitment to a  minimum of 150 minutes of exercise per week as able.  The importance of healthy food choices with portion control discussed, as well as eating regularly and within a 12 hour window most days. The need to choose "clean , green" food 50 to 75% of the time is discussed, as well as to make water the primary drink and set a goal of 64 ounces water daily.    Weight /BMI 08/21/2019 08/08/2019 08/07/2019  WEIGHT 291 lb 291 lb 291 lb 0.1 oz  HEIGHT 5\' 4"  5\' 4"  5\' 4"   BMI 49.95 kg/m2 49.95 kg/m2 49.95  kg/m2      Prediabetes Patient educated about the importance of limiting  Carbohydrate intake , the need to commit to daily physical activity for a minimum of 30 minutes , and to commit weight loss. The fact that changes in all these areas will reduce or eliminate all together the development of diabetes is stressed.   Diabetic Labs Latest Ref Rng & Units 07/18/2019 07/11/2019 01/12/2019 12/19/2018 06/07/2018  HbA1c 4.0 - 5.6 % 5.9(A) - 6.1(H) - 6.1(H)  Chol <200 mg/dL - 208(H) 211(H) - 219(H)  HDL > OR = 50 mg/dL - 55 50 - 51  Calc LDL mg/dL (calc) - 128(H) 134(H) - 145(H)  Triglycerides <150 mg/dL - 132 144 - 110  Creatinine 0.50 - 1.05 mg/dL - 0.69 0.83 0.63 0.74   BP/Weight 08/21/2019 08/08/2019 08/07/2019 07/18/2019 06/14/2019 01/18/2019 89/21/1941  Systolic BP 740 814 481 856 314 970 263  Diastolic BP 83 91 77 74 76 70 76  Wt. (Lbs) 291 291 291.01 293 294.6 289 293.1  BMI 49.95 49.95 49.95 50.29 50.57 49.61 50.31   No flowsheet data found.

## 2019-08-21 NOTE — Patient Instructions (Signed)
F/U as before , call if you need me sooner  Your blood sugar is 5.9, this is not a diabetic range which is good, keep working to get it normal at 5.6  You are  Treated for shingles, acyclovir and gabapentin are prescribed. Please keep hands off area as much as possible Since you just had prednisone, n none is prescribed at this visit  Thanks for choosing [facility], we consider it a privelige to serve you.

## 2019-08-26 ENCOUNTER — Encounter: Payer: Self-pay | Admitting: Family Medicine

## 2019-08-26 NOTE — Assessment & Plan Note (Signed)
  Patient re-educated about  the importance of commitment to a  minimum of 150 minutes of exercise per week as able.  The importance of healthy food choices with portion control discussed, as well as eating regularly and within a 12 hour window most days. The need to choose "clean , green" food 50 to 75% of the time is discussed, as well as to make water the primary drink and set a goal of 64 ounces water daily.    Weight /BMI 08/21/2019 08/08/2019 08/07/2019  WEIGHT 291 lb 291 lb 291 lb 0.1 oz  HEIGHT 5\' 4"  5\' 4"  5\' 4"   BMI 49.95 kg/m2 49.95 kg/m2 49.95 kg/m2

## 2019-08-26 NOTE — Assessment & Plan Note (Signed)
Patient educated about the importance of limiting  Carbohydrate intake , the need to commit to daily physical activity for a minimum of 30 minutes , and to commit weight loss. The fact that changes in all these areas will reduce or eliminate all together the development of diabetes is stressed.   Diabetic Labs Latest Ref Rng & Units 07/18/2019 07/11/2019 01/12/2019 12/19/2018 06/07/2018  HbA1c 4.0 - 5.6 % 5.9(A) - 6.1(H) - 6.1(H)  Chol <200 mg/dL - 208(H) 211(H) - 219(H)  HDL > OR = 50 mg/dL - 55 50 - 51  Calc LDL mg/dL (calc) - 128(H) 134(H) - 145(H)  Triglycerides <150 mg/dL - 132 144 - 110  Creatinine 0.50 - 1.05 mg/dL - 0.69 0.83 0.63 0.74   BP/Weight 08/21/2019 08/08/2019 08/07/2019 07/18/2019 06/14/2019 01/18/2019 93/26/7124  Systolic BP 580 998 338 250 539 767 341  Diastolic BP 83 91 77 74 76 70 76  Wt. (Lbs) 291 291 291.01 293 294.6 289 293.1  BMI 49.95 49.95 49.95 50.29 50.57 49.61 50.31   No flowsheet data found.

## 2019-08-26 NOTE — Assessment & Plan Note (Signed)
Acute outbreak on posterior left chest, acyclovir and gaabapentin prescribed

## 2019-08-29 ENCOUNTER — Ambulatory Visit (HOSPITAL_COMMUNITY)
Admission: RE | Admit: 2019-08-29 | Discharge: 2019-08-29 | Disposition: A | Discharge status: Home / Self Care | Payer: 59 | Source: Ambulatory Visit | Attending: Orthopaedic Surgery | Admitting: Orthopaedic Surgery

## 2019-08-29 ENCOUNTER — Telehealth: Payer: Self-pay | Admitting: Radiology

## 2019-08-29 ENCOUNTER — Other Ambulatory Visit: Payer: Self-pay

## 2019-08-29 DIAGNOSIS — S99911A Unspecified injury of right ankle, initial encounter: Secondary | ICD-10-CM | POA: Diagnosis not present

## 2019-08-29 DIAGNOSIS — S86011A Strain of right Achilles tendon, initial encounter: Secondary | ICD-10-CM

## 2019-08-29 NOTE — Telephone Encounter (Signed)
I called patient to advise MRI shows tear of Achilles with retraction, and she will need to go to ankle surgeon in [place]  Will put in the referral after I talk to patient.  Left message for her to call back so I can advise.

## 2019-08-29 NOTE — Telephone Encounter (Signed)
Let DR. Aline Brochure know and see if he wants to do surgery.

## 2019-08-29 NOTE — Telephone Encounter (Signed)
Let s refer this to Dr Sharol Given

## 2019-08-29 NOTE — Telephone Encounter (Signed)
I called patient/ explained report put in referral to Dr Sharol Given in [place] told her to expect call She voiced understanding   To Dr Daine Gip

## 2019-08-29 NOTE — Telephone Encounter (Signed)
[facility] called report   IMPRESSION: 1. Complete tear of the distal Achilles tendon with approximately 4.0 cm of tendinous retraction. Fractured distal Achilles enthesophyte located within the retracted portion of the tendon. The torn portion of the tendon appears severely tendinotic. 2. Short segment split tear of the infra malleolar aspect of the peroneus brevis tendon. 3. Patchy bone marrow edema centered at the anterior aspect of the distal tibial plafond and distal tibial metaphysis. No well-defined fracture line. Mild marrow edema within the anteromedial talus is also without definite fracture. Findings may reflect bone Contusions.

## 2019-08-31 ENCOUNTER — Ambulatory Visit: Payer: 59 | Admitting: Orthopaedic Surgery

## 2019-09-05 ENCOUNTER — Ambulatory Visit: Payer: 59 | Admitting: Orthopedic Surgery

## 2019-09-06 ENCOUNTER — Telehealth: Payer: Self-pay | Admitting: Orthopaedic Surgery

## 2019-09-06 NOTE — Telephone Encounter (Signed)
Patient was made aware per notes in workqueue. (MRI was completed on 08/29/19)

## 2019-09-06 NOTE — Telephone Encounter (Signed)
Patient called to relay that her referral appointment with Dr Sharol Given has been rescheduled to 09/14/19 per [place]. May she receive an updated out of work note to cover through this date?

## 2019-09-07 ENCOUNTER — Encounter: Payer: Self-pay | Admitting: Orthopaedic Surgery

## 2019-09-07 NOTE — Telephone Encounter (Signed)
ok 

## 2019-09-07 NOTE — Telephone Encounter (Signed)
Called back to patient to notify Dr Luna Glasgow has approved note to be issued as requested, called patient, left message, ready for pick up.

## 2019-09-14 ENCOUNTER — Ambulatory Visit (INDEPENDENT_AMBULATORY_CARE_PROVIDER_SITE_OTHER): Payer: 59 | Admitting: Orthopedic Surgery

## 2019-09-14 ENCOUNTER — Encounter: Payer: Self-pay | Admitting: Orthopedic Surgery

## 2019-09-14 VITALS — Ht 64.0 in | Wt 291.0 lb

## 2019-09-14 DIAGNOSIS — S86011A Strain of right Achilles tendon, initial encounter: Secondary | ICD-10-CM | POA: Diagnosis not present

## 2019-09-14 NOTE — Progress Notes (Signed)
Office Visit Note   Patient: Kathleen Reynolds           Date of Birth: 11-Feb-1960           MRN: [id number] Visit Date: 09/14/2019              Requested by: Sanjuana Kava, [place],   [id number] PCP: Fayrene Helper, MD  Chief Complaint  Patient presents with  . Right Achilles Tendon - Pain    MRI 08/29/19 achilles tear       HPI: Patient is a 59 year old woman who is seen for initial evaluation for a right Achilles tendon rupture.  Patient states she injured her Achilles on July 4 when she stepped off a curb and rolled her ankle.  She had an MRI scan obtained July 27.  Patient is currently ambulating in a fracture boot complains of pain with weightbearing.  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  Assessment & Plan: Visit Diagnoses:  1. Achilles rupture, right, initial encounter     Plan: Discussed with the patient recommendation to proceed with reconstruction of the Achilles tendon discussed that with surgery this should improve her function however there are increased risk of infection wound not healing need for additional surgery.  Patient states she understands wished to proceed with surgery at this time with plan for outpatient surgery at [facility] next Friday.  Discussed the patient most likely will be out of work for about 2 months after surgery.  Follow-Up Instructions: Return in about 2 weeks (around 09/28/2019).   Ortho Exam  Patient is alert, oriented, no adenopathy, well-dressed, normal affect, normal respiratory effort. Examination patient has a good dorsalis pedis pulse.  She has a palpable defect the mid aspect of the Achilles on the right.  With the patient kneeling compression the calf does not use plantarflexion on the right good plantarflexion on the left.  Patient's most recent hemoglobin A1c is 5.9  MRI scan is reviewed which does show a  complete tear of the Achilles tendon.  Imaging: No results found. No images are attached to the encounter.  Labs: Lab Results  Component Value Date   HGBA1C 5.9 (A) 07/18/2019   HGBA1C 6.1 (H) 01/12/2019   HGBA1C 6.1 (H) 06/07/2018   LABURIC 7.0 07/10/2010     Lab Results  Component Value Date   ALBUMIN 4.1 12/19/2018   ALBUMIN 4.1 05/24/2018   ALBUMIN 4.2 08/01/2014   LABURIC 7.0 07/10/2010    No results found for: MG Lab Results  Component Value Date   VD25OH 33 07/11/2019   VD25OH 24.0 (L) 12/19/2018   VD25OH 25 (L) 01/28/2017    No results found for: PREALBUMIN CBC EXTENDED Latest Ref Rng & Units 01/12/2019 12/19/2018 06/07/2018  WBC 3.8 - 10.8 Thousand/uL 5.2 4.2 4.9  RBC 3.80 - 5.10 Million/uL 4.32 4.32 4.23  HGB 11.7 - 15.5 g/dL 13.2 13.3 13.1  HCT 35 - 45 % 39.9 40.2 38.7  PLT 140 - 400 Thousand/uL 236 171 194  NEUTROABS 1.7 - 7.7 K/uL - 2.1 -  LYMPHSABS 0.7 - 4.0 K/uL - 1.7 -     Body mass index is 49.95 kg/m.  Orders:  No orders of the defined types were placed in this encounter.  No orders of the defined types were placed in this encounter.    Procedures: No procedures performed  Clinical Data: No  additional findings.  ROS:  All other systems negative, except as noted in the HPI. Review of Systems  Objective: Vital Signs: Ht 5' 4"  (1.626 m)   Wt 291 lb (132 kg)   LMP 03/27/2011   BMI 49.95 kg/m   Specialty Comments:  No specialty comments available.  PMFS History: Patient Active Problem List   Diagnosis Date Noted  . Shingles rash 08/21/2019  . Hx of adenomatous colonic polyps 06/14/2019  . Hemorrhoid 06/14/2019  . History of colonic polyps 09/05/2018  . Family history of colon cancer 09/05/2018  . Trigger thumb, left thumb 11/28/2017  . Breast cancer, left breast ([place]) 08/16/2015  . Sciatica of right side associated with disorder of lumbar spine 08/27/2014  . Allergic rhinitis 07/03/2014  . Vertigo, intermittent  05/20/2014  . Nonspecific elevation of levels of transaminase or lactic acid dehydrogenase (LDH) 05/09/2014  . Rotator cuff syndrome 09/19/2013  . Metabolic syndrome X 56/97/9480  . Anemia 02/18/2012  . Obesity, morbid, BMI 40.0-49.9 ([place]) 02/18/2012  . OA (osteoarthritis) of knee 12/29/2011  . Chronic pain of right knee 12/14/2011  . BRCA1 positive 07/01/2011  . Invasive ductal carcinoma of right breast, stage 1 02/18/2011  . Prediabetes 01/22/2011  . Headache 01/01/2010  . Thyroid nodule 05/17/2009  . Hyperlipidemia LDL goal <100 02/22/2007   Past Medical History:  Diagnosis Date  . Anemia LIFELONG   HEAVY MENSES MAR 2012 HB 13 MCV 86.7 FERRITIN  66  . Arthritis   . Breast cancer ([place])    Right  . Breast mass in female    right  . 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 Hyperlipidemia   . Left rotator cuff tear   . Migraines   . Morbid obesity ([place])   . Personal history of chemotherapy   . Personal history of radiation therapy   . RUPTURE ROTATOR CUFF 06/08/2007   Qualifier: Diagnosis of  By: Aline Brochure MD, Dorothyann Peng    . Urticaria     Family History  Problem Relation Age of Onset  . Hypertension Mother   . Cancer Father        lung cancer  . Diabetes Sister   . Cancer Brother        colon cancer, younger than the age of 68  . Heart disease Sister        heart attack  . Colon polyps Sister   . Asthma Sister   . Eczema Sister   . Allergic rhinitis Neg Hx   . Urticaria Neg Hx     Past Surgical History:  Procedure Laterality Date  . BREAST BIOPSY  02/13/2011   Procedure: BREAST BIOPSY WITH NEEDLE LOCALIZATION;  Surgeon: Adin Hector, MD;  Location: [facility];  Service: General;  Laterality: Right;  . BREAST LUMPECTOMY Right 2013  . BREAST SURGERY  11/1997   Left breast cancer-mastectomy  . BREAST SURGERY  2/13-   rt axilly bx  . COLONOSCOPY  2004 DR. SMITH   No polyp, hemorrhoids  . COLONOSCOPY  06/09/10   [id number] adenomas   . COLONOSCOPY N/A 08/19/2015    Procedure: COLONOSCOPY;  Surgeon: Danie Binder, MD;  [facility];  Service: Endoscopy;  Laterality: N/A;  130 - moved to 12:45 - Ginger notified pt  . COLONOSCOPY WITH PROPOFOL N/A 11/29/2018   Fields: Past simple adenomas removed, diverticulosis, hemorrhoids.  Next colonoscopy in 3 years.  Marland Kitchen DILATION AND CURETTAGE OF UTERUS    . DILATION AND CURETTAGE OF UTERUS  05/2002  .  hystersonogram  2009  . MASTECTOMY  1999   left breast cancer   . POLYPECTOMY  11/29/2018   Procedure: POLYPECTOMY;  Surgeon: Danie Binder, MD;  [facility];  Service: Endoscopy;;  colon  . PORT-A-CATH REMOVAL  10/20/2011   Procedure: MINOR REMOVAL PORT-A-CATH;  Surgeon: Adin Hector, MD;  Location: [facility];  Service: General;  Laterality: N/A;  Porta-cath removal  left  . PORTACATH PLACEMENT  03/27/2011   Procedure: INSERTION PORT-A-CATH;  Surgeon: Adin Hector, MD;  Location: [facility];  Service: General;  Laterality: Left;  insert port a cath   Social History   Occupational History  . Not on file  Tobacco Use  . Smoking status: Never Smoker  . Smokeless tobacco: Never Used  Vaping Use  . Vaping Use: Never used  Substance and Sexual Activity  . Alcohol use: Yes    Alcohol/week: 2.0 standard drinks    Types: 2 Standard drinks or equivalent per week    Comment: occasional;   . Drug use: No  . Sexual activity: Yes    Birth control/protection: None

## 2019-09-18 ENCOUNTER — Other Ambulatory Visit: Payer: Self-pay | Admitting: Physician Assistant

## 2019-09-19 ENCOUNTER — Other Ambulatory Visit (HOSPITAL_COMMUNITY)
Admission: RE | Admit: 2019-09-19 | Discharge: 2019-09-19 | Disposition: A | Discharge status: Home / Self Care | Payer: 59 | Source: Ambulatory Visit | Attending: Orthopedic Surgery | Admitting: Orthopedic Surgery

## 2019-09-19 DIAGNOSIS — Z20822 Contact with and (suspected) exposure to covid-19: Secondary | ICD-10-CM | POA: Diagnosis not present

## 2019-09-19 DIAGNOSIS — Z01812 Encounter for preprocedural laboratory examination: Secondary | ICD-10-CM | POA: Insufficient documentation

## 2019-09-19 LAB — SARS CORONAVIRUS 2 (TAT 6-24 HRS): SARS Coronavirus 2: NEGATIVE

## 2019-09-20 ENCOUNTER — Other Ambulatory Visit: Payer: Self-pay

## 2019-09-21 ENCOUNTER — Encounter (HOSPITAL_COMMUNITY): Payer: Self-pay | Admitting: Orthopedic Surgery

## 2019-09-21 ENCOUNTER — Other Ambulatory Visit: Payer: Self-pay

## 2019-09-21 MED ORDER — DEXTROSE 5 % IV SOLN
3.0000 g | INTRAVENOUS | Status: AC
  Administered 2019-09-22: 3 g via INTRAVENOUS
  Filled 2019-09-21: qty 3000
  Filled 2019-09-21: qty 3

## 2019-09-21 NOTE — Progress Notes (Signed)
Pt denies SOB, chest pain, and being under the care of a cardiologist. Pt stated that PCP is Dr. [name]. Pt denies having a stress test, echo and cardiac cath. Pt denies having an EKG and chest x ray. Pt denies recent labs. Pt made aware to stop taking  Aspirin (unless otherwise advised by surgeon), vitamins, fish oil, Biotin, Elderberry and herbal medications. Do not take any NSAIDs ie: Ibuprofen, Advil, Naproxen (Aleve), Motrin, BC and Goody Powder. Pt reminded to continue to quarantine. Pt verbalized understanding of all pre-op instructions.

## 2019-09-22 ENCOUNTER — Encounter (HOSPITAL_COMMUNITY): Payer: Self-pay | Admitting: Orthopedic Surgery

## 2019-09-22 ENCOUNTER — Ambulatory Visit (HOSPITAL_COMMUNITY): Payer: 59 | Admitting: Anesthesiology

## 2019-09-22 ENCOUNTER — Encounter (HOSPITAL_COMMUNITY)
Admission: RE | Disposition: A | Discharge status: Home / Self Care | Payer: Self-pay | Source: Home / Self Care | Attending: Orthopedic Surgery

## 2019-09-22 ENCOUNTER — Ambulatory Visit (HOSPITAL_COMMUNITY)
Admission: RE | Admit: 2019-09-22 | Discharge: 2019-09-22 | Disposition: A | Discharge status: Home / Self Care | Payer: 59 | Attending: Orthopedic Surgery | Admitting: Orthopedic Surgery

## 2019-09-22 ENCOUNTER — Other Ambulatory Visit: Payer: Self-pay

## 2019-09-22 DIAGNOSIS — Z9012 Acquired absence of left breast and nipple: Secondary | ICD-10-CM | POA: Diagnosis not present

## 2019-09-22 DIAGNOSIS — Z886 Allergy status to analgesic agent status: Secondary | ICD-10-CM | POA: Insufficient documentation

## 2019-09-22 DIAGNOSIS — Z87892 Personal history of anaphylaxis: Secondary | ICD-10-CM | POA: Diagnosis not present

## 2019-09-22 DIAGNOSIS — Z833 Family history of diabetes mellitus: Secondary | ICD-10-CM | POA: Insufficient documentation

## 2019-09-22 DIAGNOSIS — Z6841 Body Mass Index (BMI) 40.0 and over, adult: Secondary | ICD-10-CM | POA: Diagnosis not present

## 2019-09-22 DIAGNOSIS — E119 Type 2 diabetes mellitus without complications: Secondary | ICD-10-CM | POA: Insufficient documentation

## 2019-09-22 DIAGNOSIS — S86011A Strain of right Achilles tendon, initial encounter: Secondary | ICD-10-CM | POA: Diagnosis present

## 2019-09-22 DIAGNOSIS — M1990 Unspecified osteoarthritis, unspecified site: Secondary | ICD-10-CM | POA: Diagnosis not present

## 2019-09-22 DIAGNOSIS — X501XXA Overexertion from prolonged static or awkward postures, initial encounter: Secondary | ICD-10-CM | POA: Insufficient documentation

## 2019-09-22 DIAGNOSIS — Z853 Personal history of malignant neoplasm of breast: Secondary | ICD-10-CM | POA: Diagnosis not present

## 2019-09-22 DIAGNOSIS — Z9221 Personal history of antineoplastic chemotherapy: Secondary | ICD-10-CM | POA: Diagnosis not present

## 2019-09-22 DIAGNOSIS — Z923 Personal history of irradiation: Secondary | ICD-10-CM | POA: Diagnosis not present

## 2019-09-22 HISTORY — DX: Type 2 diabetes mellitus without complications: E11.9

## 2019-09-22 HISTORY — DX: Prediabetes: R73.03

## 2019-09-22 HISTORY — PX: ACHILLES TENDON SURGERY: SHX542

## 2019-09-22 HISTORY — DX: Achilles tendinitis, right leg: M76.61

## 2019-09-22 LAB — GLUCOSE, CAPILLARY
Glucose-Capillary: 97 mg/dL (ref 70–99)
Glucose-Capillary: 99 mg/dL (ref 70–99)

## 2019-09-22 LAB — BASIC METABOLIC PANEL
Anion gap: 12 (ref 5–15)
BUN: 15 mg/dL (ref 6–20)
CO2: 24 mmol/L (ref 22–32)
Calcium: 9.5 mg/dL (ref 8.9–10.3)
Chloride: 104 mmol/L (ref 98–111)
Creatinine, Ser: 0.75 mg/dL (ref 0.44–1.00)
GFR calc Af Amer: >60 mL/min (ref >60)
GFR calc non Af Amer: >60 mL/min (ref >60)
Glucose, Bld: 94 mg/dL (ref 70–99)
Potassium: 4 mmol/L (ref 3.5–5.1)
Sodium: 140 mmol/L (ref 135–145)

## 2019-09-22 LAB — CBC
HCT: 41 % (ref 36.0–46.0)
Hemoglobin: 13.5 g/dL (ref 12.0–15.0)
MCH: 30.5 pg (ref 26.0–34.0)
MCHC: 32.9 g/dL (ref 30.0–36.0)
MCV: 92.6 fL (ref 80.0–100.0)
Platelets: ADEQUATE 10*3/uL (ref 150–400)
RBC: 4.43 MIL/uL (ref 3.87–5.11)
RDW: 15.2 % (ref 11.5–15.5)
WBC: 5.2 10*3/uL (ref 4.0–10.5)
nRBC: 0 % (ref 0.0–0.2)

## 2019-09-22 SURGERY — REPAIR, TENDON, ACHILLES
Anesthesia: General | Laterality: Right

## 2019-09-22 MED ORDER — LIDOCAINE 2% (20 MG/ML) 5 ML SYRINGE
INTRAMUSCULAR | Status: DC | PRN
  Administered 2019-09-22: 40 mg via INTRAVENOUS

## 2019-09-22 MED ORDER — FENTANYL CITRATE (PF) 100 MCG/2ML IJ SOLN
INTRAMUSCULAR | Status: AC
  Administered 2019-09-22: 100 ug via INTRAVENOUS
  Filled 2019-09-22: qty 2

## 2019-09-22 MED ORDER — PHENYLEPHRINE HCL-NACL 10-0.9 MG/250ML-% IV SOLN
INTRAVENOUS | Status: DC | PRN
  Administered 2019-09-22: 30 ug/min via INTRAVENOUS

## 2019-09-22 MED ORDER — DEXAMETHASONE SODIUM PHOSPHATE 10 MG/ML IJ SOLN
INTRAMUSCULAR | Status: AC
  Filled 2019-09-22: qty 1

## 2019-09-22 MED ORDER — LACTATED RINGERS IV SOLN: INTRAVENOUS | Status: DC

## 2019-09-22 MED ORDER — ORAL CARE MOUTH RINSE: 15.0000 mL | Freq: Once | OROMUCOSAL | Status: AC

## 2019-09-22 MED ORDER — HYDROMORPHONE HCL 1 MG/ML IJ SOLN: 0.2500 mg | INTRAMUSCULAR | Status: DC | PRN

## 2019-09-22 MED ORDER — LIDOCAINE 2% (20 MG/ML) 5 ML SYRINGE
INTRAMUSCULAR | Status: AC
  Filled 2019-09-22: qty 10

## 2019-09-22 MED ORDER — AMISULPRIDE (ANTIEMETIC) 5 MG/2ML IV SOLN: 10.0000 mg | Freq: Once | INTRAVENOUS | Status: DC | PRN

## 2019-09-22 MED ORDER — DEXAMETHASONE SODIUM PHOSPHATE 10 MG/ML IJ SOLN
INTRAMUSCULAR | Status: DC | PRN
  Administered 2019-09-22: 5 mg via INTRAVENOUS

## 2019-09-22 MED ORDER — ROPIVACAINE HCL 5 MG/ML IJ SOLN
INTRAMUSCULAR | Status: DC | PRN
  Administered 2019-09-22: 30 mL via PERINEURAL

## 2019-09-22 MED ORDER — CHLORHEXIDINE GLUCONATE 0.12 % MT SOLN
OROMUCOSAL | Status: AC
  Administered 2019-09-22: 15 mL via OROMUCOSAL
  Filled 2019-09-22: qty 15

## 2019-09-22 MED ORDER — EPHEDRINE SULFATE-NACL 50-0.9 MG/10ML-% IV SOSY
PREFILLED_SYRINGE | INTRAVENOUS | Status: DC | PRN
  Administered 2019-09-22: 10 mg via INTRAVENOUS
  Administered 2019-09-22: 5 mg via INTRAVENOUS

## 2019-09-22 MED ORDER — CHLORHEXIDINE GLUCONATE 0.12 % MT SOLN: 15.0000 mL | Freq: Once | OROMUCOSAL | Status: AC

## 2019-09-22 MED ORDER — MEPERIDINE HCL 25 MG/ML IJ SOLN: 6.2500 mg | INTRAMUSCULAR | Status: DC | PRN

## 2019-09-22 MED ORDER — OXYCODONE HCL 5 MG/5ML PO SOLN: 5.0000 mg | Freq: Once | ORAL | Status: DC | PRN

## 2019-09-22 MED ORDER — FENTANYL CITRATE (PF) 250 MCG/5ML IJ SOLN
INTRAMUSCULAR | Status: DC | PRN
  Administered 2019-09-22: 25 ug via INTRAVENOUS

## 2019-09-22 MED ORDER — ONDANSETRON HCL 4 MG/2ML IJ SOLN
INTRAMUSCULAR | Status: DC | PRN
  Administered 2019-09-22: 4 mg via INTRAVENOUS

## 2019-09-22 MED ORDER — PHENYLEPHRINE 40 MCG/ML (10ML) SYRINGE FOR IV PUSH (FOR BLOOD PRESSURE SUPPORT)
PREFILLED_SYRINGE | INTRAVENOUS | Status: DC | PRN
  Administered 2019-09-22 (×2): 80 ug via INTRAVENOUS

## 2019-09-22 MED ORDER — FENTANYL CITRATE (PF) 250 MCG/5ML IJ SOLN
INTRAMUSCULAR | Status: AC
  Filled 2019-09-22: qty 5

## 2019-09-22 MED ORDER — OXYCODONE HCL 5 MG PO TABS: 5.0000 mg | ORAL_TABLET | Freq: Once | ORAL | Status: DC | PRN

## 2019-09-22 MED ORDER — ONDANSETRON HCL 4 MG/2ML IJ SOLN
INTRAMUSCULAR | Status: AC
  Filled 2019-09-22: qty 2

## 2019-09-22 MED ORDER — 0.9 % SODIUM CHLORIDE (POUR BTL) OPTIME
TOPICAL | Status: DC | PRN
  Administered 2019-09-22: 1000 mL

## 2019-09-22 MED ORDER — MIDAZOLAM HCL 2 MG/2ML IJ SOLN: 2.0000 mg | Freq: Once | INTRAMUSCULAR | Status: AC

## 2019-09-22 MED ORDER — PROMETHAZINE HCL 25 MG/ML IJ SOLN: 6.2500 mg | INTRAMUSCULAR | Status: DC | PRN

## 2019-09-22 MED ORDER — PROPOFOL 10 MG/ML IV BOLUS
INTRAVENOUS | Status: DC | PRN
  Administered 2019-09-22: 200 mg via INTRAVENOUS

## 2019-09-22 MED ORDER — OXYCODONE-ACETAMINOPHEN 5-325 MG PO TABS: 1.0000 | ORAL_TABLET | ORAL | 0 refills | Status: DC | PRN

## 2019-09-22 MED ORDER — MIDAZOLAM HCL 2 MG/2ML IJ SOLN
INTRAMUSCULAR | Status: AC
  Administered 2019-09-22: 2 mg via INTRAVENOUS
  Filled 2019-09-22: qty 2

## 2019-09-22 MED ORDER — FENTANYL CITRATE (PF) 100 MCG/2ML IJ SOLN: 100.0000 ug | Freq: Once | INTRAMUSCULAR | Status: AC

## 2019-09-22 SURGICAL SUPPLY — 39 items
BNDG CMPR 9X4 STRL LF SNTH (GAUZE/BANDAGES/DRESSINGS)
BNDG COHESIVE 6X5 TAN NS LF (GAUZE/BANDAGES/DRESSINGS) ×1 IMPLANT
BNDG COHESIVE 6X5 TAN STRL LF (GAUZE/BANDAGES/DRESSINGS) ×4 IMPLANT
BNDG ESMARK 4X9 LF (GAUZE/BANDAGES/DRESSINGS) IMPLANT
BNDG GAUZE ELAST 4 BULKY (GAUZE/BANDAGES/DRESSINGS) ×1 IMPLANT
CANISTER SUCT 3000ML PPV (MISCELLANEOUS) ×2 IMPLANT
COVER SURGICAL LIGHT HANDLE (MISCELLANEOUS) ×4 IMPLANT
COVER WAND RF STERILE (DRAPES) ×2 IMPLANT
CUFF TOURN SGL QUICK 34 (TOURNIQUET CUFF)
CUFF TOURN SGL QUICK 42 (TOURNIQUET CUFF) IMPLANT
CUFF TRNQT CYL 34X4.125X (TOURNIQUET CUFF) IMPLANT
DRAPE U-SHAPE 47X51 STRL (DRAPES) ×2 IMPLANT
DRSG ADAPTIC 3X8 NADH LF (GAUZE/BANDAGES/DRESSINGS) ×2 IMPLANT
DURAPREP 26ML APPLICATOR (WOUND CARE) ×2 IMPLANT
ELECT REM PT RETURN 9FT ADLT (ELECTROSURGICAL) ×2
ELECTRODE REM PT RTRN 9FT ADLT (ELECTROSURGICAL) ×1 IMPLANT
GAUZE SPONGE 4X4 12PLY STRL (GAUZE/BANDAGES/DRESSINGS) ×2 IMPLANT
GAUZE SPONGE 4X4 16PLY XRAY LF (GAUZE/BANDAGES/DRESSINGS) ×2 IMPLANT
GLOVE BIOGEL PI IND STRL 9 (GLOVE) ×1 IMPLANT
GLOVE BIOGEL PI INDICATOR 9 (GLOVE) ×1
GLOVE SURG ORTHO 9.0 STRL STRW (GLOVE) ×2 IMPLANT
GOWN STRL REUS W/ TWL XL LVL3 (GOWN DISPOSABLE) ×3 IMPLANT
GOWN STRL REUS W/TWL XL LVL3 (GOWN DISPOSABLE) ×6
KIT TURNOVER KIT B (KITS) ×2 IMPLANT
NDL SUT .5 MAYO 1.404X.05X (NEEDLE) ×1 IMPLANT
NEEDLE MAYO TAPER (NEEDLE) ×2
NS IRRIG 1000ML POUR BTL (IV SOLUTION) ×2 IMPLANT
PACK ORTHO EXTREMITY (CUSTOM PROCEDURE TRAY) ×2 IMPLANT
PAD ABD 8X10 STRL (GAUZE/BANDAGES/DRESSINGS) ×1 IMPLANT
PAD ARMBOARD 7.5X6 YLW CONV (MISCELLANEOUS) ×4 IMPLANT
SPONGE LAP 18X18 RF (DISPOSABLE) ×2 IMPLANT
SUT ETHILON 2 0 PSLX (SUTURE) ×4 IMPLANT
SUT FIBERWIRE #2 38 T-5 BLUE (SUTURE) ×4
SUTURE FIBERWR #2 38 T-5 BLUE (SUTURE) ×2 IMPLANT
TOWEL GREEN STERILE (TOWEL DISPOSABLE) ×2 IMPLANT
TOWEL GREEN STERILE FF (TOWEL DISPOSABLE) ×2 IMPLANT
TUBE CONNECTING 12X1/4 (SUCTIONS) ×2 IMPLANT
WATER STERILE IRR 1000ML POUR (IV SOLUTION) ×2 IMPLANT
YANKAUER SUCT BULB TIP NO VENT (SUCTIONS) ×2 IMPLANT

## 2019-09-22 NOTE — H&P (Signed)
Kathleen Reynolds is an 59 y.o. female.   Chief Complaint: Right Achilles Tendon Rupture HPI: Patient is a 59 year old woman who is seen for initial evaluation for a right Achilles tendon rupture.  Patient states she injured her Achilles on July 4 when she stepped off a curb and rolled her ankle.  She had an MRI scan obtained July 27.  Patient is currently ambulating in a fracture boot complains of pain with weightbearing.  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  Past Medical History:  Diagnosis Date  . Anemia LIFELONG   HEAVY MENSES MAR 2012 HB 13 MCV 86.7 FERRITIN  66  . Arthritis   . Breast cancer ([place])    Right  . Breast mass in female    right  . 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 Hyperlipidemia   . Left rotator cuff tear   . Migraines   . Morbid obesity ([place])   . Personal history of chemotherapy   . Personal history of radiation therapy   . Pre-diabetes   . RUPTURE ROTATOR CUFF 06/08/2007   Qualifier: Diagnosis of  By: Aline Brochure MD, Dorothyann Peng    . Tendonitis, Achilles, right   . Urticaria     Past Surgical History:  Procedure Laterality Date  . BREAST BIOPSY  02/13/2011   Procedure: BREAST BIOPSY WITH NEEDLE LOCALIZATION;  Surgeon: Adin Hector, MD;  Location: [facility];  Service: General;  Laterality: Right;  . BREAST LUMPECTOMY Right 2013  . BREAST SURGERY  11/1997   Left breast cancer-mastectomy  . BREAST SURGERY  2/13-   rt axilly bx  . COLONOSCOPY  2004 DR. SMITH   No polyp, hemorrhoids  . COLONOSCOPY  06/09/10   [id number] adenomas   . COLONOSCOPY N/A 08/19/2015   Procedure: COLONOSCOPY;  Surgeon: Danie Binder, MD;  [facility];  Service: Endoscopy;  Laterality: N/A;  130 - moved to 12:45 - Ginger notified pt  . COLONOSCOPY WITH PROPOFOL N/A 11/29/2018   Fields: Past simple adenomas removed, diverticulosis, hemorrhoids.  Next colonoscopy in 3 years.   Marland Kitchen DILATION AND CURETTAGE OF UTERUS    . DILATION AND CURETTAGE OF UTERUS  05/2002  . hystersonogram  2009  . MASTECTOMY  1999   left breast cancer   . POLYPECTOMY  11/29/2018   Procedure: POLYPECTOMY;  Surgeon: Danie Binder, MD;  [facility];  Service: Endoscopy;;  colon  . PORT-A-CATH REMOVAL  10/20/2011   Procedure: MINOR REMOVAL PORT-A-CATH;  Surgeon: Adin Hector, MD;  Location: [facility];  Service: General;  Laterality: N/A;  Porta-cath removal  left  . PORTACATH PLACEMENT  03/27/2011   Procedure: INSERTION PORT-A-CATH;  Surgeon: Adin Hector, MD;  Location: [facility];  Service: General;  Laterality: Left;  insert port a cath    Family History  Problem Relation Age of Onset  . Hypertension Mother   . Cancer Father        lung cancer  . Diabetes Sister   . Cancer Brother        colon cancer, younger than the age of 31  . Heart disease Sister        heart attack  . Colon polyps Sister   . Asthma Sister   . Eczema Sister   . Allergic rhinitis Neg Hx   . Urticaria Neg Hx    Social History:  reports that she has never smoked. She has never used smokeless tobacco. She reports current alcohol use of about 2.0 standard drinks of alcohol per week. She reports that she does not use drugs.  Allergies:  Allergies  Allergen Reactions  . Nsaids Anaphylaxis    Ibuprofen required hospitalization in 2016 due to ibuprofen  . Ibuprofen Itching    No medications prior to admission.    No results found for this or any previous visit (from the past 48 hour(s)). No results found.  Review of Systems  All other systems reviewed and are negative.   Last menstrual period 03/27/2011. Physical Exam  Patient is alert, oriented, no adenopathy, well-dressed, normal affect, normal respiratory effort. Examination patient has a good dorsalis pedis pulse.  She has a palpable defect the mid aspect of the Achilles on the right.  With the  patient kneeling compression the calf does not use plantarflexion on the right good plantarflexion on the left.  Patient's most recent hemoglobin A1c is 5.9  MRI scan is reviewed which does show a complete tear of the Achilles tendon.Heart RRR Lungs Clear Assessment/Plan 1. Achilles rupture, right, initial encounter     Plan: Discussed with the patient recommendation to proceed with reconstruction of the Achilles tendon discussed that with surgery this should improve her function however there are increased risk of infection wound not healing need for additional surgery.  Patient states she understands wished to proceed with surgery at this time with plan for outpatient surgery at [facility] next Friday.  Discussed the patient most likely will be out of work for about 2 months after surgery.   Bevely Palmer Evalyse Stroope, PA 09/22/2019, 6:34 AM

## 2019-09-22 NOTE — Anesthesia Procedure Notes (Signed)
Procedure Name: LMA Insertion Date/Time: 09/22/2019 2:09 PM Performed by: Gwyndolyn Saxon, CRNA Pre-anesthesia Checklist: Patient identified, Emergency Drugs available, Suction available, Patient being monitored and Timeout performed Patient Re-evaluated:Patient Re-evaluated prior to induction Oxygen Delivery Method: Circle system utilized Preoxygenation: Pre-oxygenation with 100% oxygen Induction Type: IV induction Ventilation: Mask ventilation without difficulty LMA: LMA inserted LMA Size: 4.0 Number of attempts: 1 Placement Confirmation: breath sounds checked- equal and bilateral and positive ETCO2 Tube secured with: Tape Dental Injury: Teeth and Oropharynx as per pre-operative assessment  Comments: Placed by Hilda Blades

## 2019-09-22 NOTE — Anesthesia Postprocedure Evaluation (Signed)
Anesthesia Post Note  Patient: [name]  Procedure(s) Performed: RIGHT ACHILLES TENDON RECONSTRUCTION (Right )     Patient location during evaluation: PACU Anesthesia Type: General Level of consciousness: awake and alert Pain management: pain level controlled Vital Signs Assessment: post-procedure vital signs reviewed and stable Respiratory status: spontaneous breathing, nonlabored ventilation and respiratory function stable Cardiovascular status: blood pressure returned to baseline and stable Postop Assessment: no apparent nausea or vomiting Anesthetic complications: no   No complications documented.  Last Vitals:  Vitals:   09/22/19 1500 09/22/19 1515  BP: 123/68 117/74  Pulse: 78 79  Resp: 14 13  Temp:  36.8 C  SpO2: 95% 97%    Last Pain:  Vitals:   09/22/19 1515  TempSrc:   PainSc: 0-No pain                 Lynda Rainwater

## 2019-09-22 NOTE — Op Note (Signed)
09/22/2019  2:45 PM  PATIENT:  Kathleen Reynolds    PRE-OPERATIVE DIAGNOSIS:  Right Achilles Tendon Rupture, chronic.  POST-OPERATIVE DIAGNOSIS:  Same  PROCEDURE:  RIGHT ACHILLES TENDON SECONDARY RECONSTRUCTION RECONSTRUCTION  SURGEON:  Newt Minion, MD  PHYSICIAN ASSISTANT:None ANESTHESIA:   General  PREOPERATIVE INDICATIONS:  [name] is a  59 y.o. female with a diagnosis of Right Achilles Tendon Rupture who failed conservative measures and elected for surgical management.    The risks benefits and alternatives were discussed with the patient preoperatively including but not limited to the risks of infection, bleeding, nerve injury, cardiopulmonary complications, the need for revision surgery, among others, and the patient was willing to proceed.  OPERATIVE IMPLANTS: None  @ENCIMAGES @  OPERATIVE FINDINGS: Large area of of scar tissue allowing the retraction of the Achilles tendon.  OPERATIVE PROCEDURE: Patient was brought the operating room and underwent a general anesthetic after regional block.  After adequate levels anesthesia were obtained patient's right lower extremity was prepped using DuraPrep draped into a sterile field a timeout was called.  A posterior medial longitudinal incision was made over the Achilles tendon.  This was carried down through the peritenon and directly to the Achilles.  There is redundant scar tissue in the mid substance of the Achilles where the Achilles had retracted.  Approximately 3 cm of the scar tissue was resected the muscle was released proximally over the to allow for distal apposition.  Using #1 Vicryl and a Krakw suture technique and 4 strands of Vicryl going across the repair site the tendon was advanced and repaired and and.  Patient had good resting length good range of motion of the ankle after repair.  The wound was irrigated with normal saline.  Peritenon was scarred and was not repaired.  The skin was closed using 2-0 nylon  in a Allgower Donati suture technique.  There was good apposition of the skin.  The sterile dressing was applied with the foot in plantarflexion and a green towel was folded up under the heel to maintain the plantar flexion position of the foot.  Patient was extubated taken the PACU in stable condition.   DISCHARGE PLANNING:  Antibiotic duration: Preoperative antibiotics  Weightbearing: Nonweightbearing  Pain medication: Prescription for Percocet  Dressing care/ Wound VAC: Follow-up in the office 1 week to change the dressing  Ambulatory devices: Crutches and walker  Discharge to: Home.  Follow-up: In the office 1 week post operative.

## 2019-09-22 NOTE — Anesthesia Procedure Notes (Signed)
Anesthesia Regional Block: Popliteal block   Pre-Anesthetic Checklist: ,, timeout performed, Correct Patient, Correct Site, Correct Laterality, Correct Procedure, Correct Position, site marked, Risks and benefits discussed,  Surgical consent,  Pre-op evaluation,  At surgeon's request and post-op pain management  Laterality: Right  Prep: chloraprep       Needles:  Injection technique: Single-shot  Needle Type: Stimiplex     Needle Length: 9cm  Needle Gauge: 21     Additional Needles:   Narrative:  Start time: 09/22/2019 1:34 PM End time: 09/22/2019 1:39 PM Injection made incrementally with aspirations every 5 mL.  Performed by: Personally  Anesthesiologist: Lynda Rainwater, MD

## 2019-09-22 NOTE — Transfer of Care (Signed)
Immediate Anesthesia Transfer of Care Note  Patient: Kathleen Reynolds  Procedure(s) Performed: RIGHT ACHILLES TENDON RECONSTRUCTION (Right )  Patient Location: PACU  Anesthesia Type:General  Level of Consciousness: drowsy, patient cooperative and responds to stimulation  Airway & Oxygen Therapy: Patient Spontanous Breathing and Patient connected to face mask oxygen  Post-op Assessment: Report given to RN and Post -op Vital signs reviewed and stable  Post vital signs: Reviewed and stable  Last Vitals:  Vitals Value Taken Time  BP    Temp    Pulse    Resp    SpO2      Last Pain:  Vitals:   09/22/19 1345  TempSrc:   PainSc: Asleep      Patients Stated Pain Goal: 4 (37/16/96 7893)  Complications: No complications documented.

## 2019-09-22 NOTE — Anesthesia Preprocedure Evaluation (Signed)
Anesthesia Evaluation  Patient identified by MRN, date of birth, ID band Patient awake    Reviewed: Allergy & Precautions, NPO status , Patient's Chart, lab work & pertinent test results  Airway Mallampati: II  TM Distance: >3 FB Neck ROM: Full    Dental no notable dental hx.    Pulmonary neg pulmonary ROS,    Pulmonary exam normal breath sounds clear to auscultation       Cardiovascular negative cardio ROS Normal cardiovascular exam Rhythm:Regular Rate:Normal     Neuro/Psych  Headaches,  Neuromuscular disease    GI/Hepatic negative GI ROS, Neg liver ROS,   Endo/Other  diabetes, Well Controlled, Type 2, Oral Hypoglycemic AgentsMorbid obesity  Renal/GU negative Renal ROS  negative genitourinary   Musculoskeletal  (+) Arthritis , Osteoarthritis,    Abdominal Normal abdominal exam  (+) + obese,   Peds negative pediatric ROS (+)  Hematology  (+) anemia ,   Anesthesia Other Findings   Reproductive/Obstetrics                             Anesthesia Physical  Anesthesia Plan  ASA: III  Anesthesia Plan: General   Post-op Pain Management:  Regional for Post-op pain   Induction: Intravenous  PONV Risk Score and Plan: 3 and Ondansetron, Dexamethasone, Midazolam and Treatment may vary due to age or medical condition  Airway Management Planned: LMA  Additional Equipment:   Intra-op Plan:   Post-operative Plan: Extubation in OR  Informed Consent: I have reviewed the patients History and Physical, chart, labs and discussed the procedure including the risks, benefits and alternatives for the proposed anesthesia with the patient or authorized representative who has indicated his/her understanding and acceptance.     Dental advisory given  Plan Discussed with: CRNA  Anesthesia Plan Comments:         Anesthesia Quick Evaluation

## 2019-09-23 ENCOUNTER — Encounter (HOSPITAL_COMMUNITY): Payer: Self-pay | Admitting: Orthopedic Surgery

## 2019-10-06 ENCOUNTER — Encounter: Payer: Self-pay | Admitting: Family

## 2019-10-06 ENCOUNTER — Ambulatory Visit (INDEPENDENT_AMBULATORY_CARE_PROVIDER_SITE_OTHER): Payer: 59 | Admitting: Family

## 2019-10-06 VITALS — Ht 64.0 in | Wt 291.0 lb

## 2019-10-06 DIAGNOSIS — S86011D Strain of right Achilles tendon, subsequent encounter: Secondary | ICD-10-CM

## 2019-10-06 NOTE — Progress Notes (Signed)
Post-Op Visit Note   Patient: Kathleen Reynolds           Date of Birth: [birth year]           MRN: [id number] Visit Date: 10/06/2019 PCP: Fayrene Helper, MD  Chief Complaint:  Chief Complaint  Patient presents with  . Right Achilles Tendon - Routine Post Op    09/22/19 right achilles tendon reconstruction     HPI:  HPI Patient is a 59 year old woman who presents status post right Achilles tendon reconstruction on August 20.  She has been nonweightbearing using crutches and states that she has a walker at home.  She has been wearing a fracture boot she does not have any heel lifts and at this time.  Her surgical dressing is in place.  Ortho Exam Incision approximated sutures is healing well there is very minimal swelling of her foot and ankle.  There is no gaping drainage or sign of infection  Visit Diagnoses: No diagnosis found.  Plan: Sutures harvested today without incident.  She will begin daily Dial soap cleansing and dry dressing changes.  Heel lifts placed in her boot.  She will continue nonweightbearing for 2 more weeks.  Follow-Up Instructions: Return in about 13 days (around 10/19/2019).   Imaging: No results found.  Orders:  No orders of the defined types were placed in this encounter.  No orders of the defined types were placed in this encounter.    PMFS History: Patient Active Problem List   Diagnosis Date Noted  . Achilles rupture, right, initial encounter   . Shingles rash 08/21/2019  . Hx of adenomatous colonic polyps 06/14/2019  . Hemorrhoid 06/14/2019  . History of colonic polyps 09/05/2018  . Family history of colon cancer 09/05/2018  . Trigger thumb, left thumb 11/28/2017  . Breast cancer, left breast ([place]) 08/16/2015  . Sciatica of right side associated with disorder of lumbar spine 08/27/2014  . Allergic rhinitis 07/03/2014  . Vertigo, intermittent 05/20/2014  . Nonspecific elevation of levels of transaminase or lactic acid dehydrogenase  (LDH) 05/09/2014  . Rotator cuff syndrome 09/19/2013  . Metabolic syndrome X 63/02/6008  . Anemia 02/18/2012  . Obesity, morbid, BMI 40.0-49.9 ([place]) 02/18/2012  . OA (osteoarthritis) of knee 12/29/2011  . Chronic pain of right knee 12/14/2011  . BRCA1 positive 07/01/2011  . Invasive ductal carcinoma of right breast, stage 1 02/18/2011  . Prediabetes 01/22/2011  . Headache 01/01/2010  . Thyroid nodule 05/17/2009  . Hyperlipidemia LDL goal <100 02/22/2007   Past Medical History:  Diagnosis Date  . Anemia LIFELONG   HEAVY MENSES MAR 2012 HB 13 MCV 86.7 FERRITIN  66  . Arthritis   . Breast cancer ([place])    Right  . Breast mass in female    right  . 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 Diabetes mellitus without complication ([place])    pre diabetic  . Hyperlipidemia   . Left rotator cuff tear   . Migraines   . Morbid obesity ([place])   . Personal history of chemotherapy   . Personal history of radiation therapy   . Pre-diabetes   . RUPTURE ROTATOR CUFF 06/08/2007   Qualifier: Diagnosis of  By: Aline Brochure MD, Dorothyann Peng    . Tendonitis, Achilles, right   . Urticaria     Family History  Problem Relation Age of Onset  . Hypertension Mother   . Cancer Father        lung cancer  . Diabetes Sister   . Cancer Brother  colon cancer, younger than the age of 40  . Heart disease Sister        heart attack  . Colon polyps Sister   . Asthma Sister   . Eczema Sister   . Allergic rhinitis Neg Hx   . Urticaria Neg Hx     Past Surgical History:  Procedure Laterality Date  . ACHILLES TENDON SURGERY Right 09/22/2019   Procedure: RIGHT ACHILLES TENDON RECONSTRUCTION;  Surgeon: Newt Minion, MD;  Location: [place];  Service: Orthopedics;  Laterality: Right;  . BREAST BIOPSY  02/13/2011   Procedure: BREAST BIOPSY WITH NEEDLE LOCALIZATION;  Surgeon: Adin Hector, MD;  Location: [place];  Service: General;  Laterality: Right;  . BREAST LUMPECTOMY Right 2013  . BREAST SURGERY   11/1997   Left breast cancer-mastectomy  . BREAST SURGERY  2/13-   rt axilly bx  . COLONOSCOPY  2004 DR. SMITH   No polyp, hemorrhoids  . COLONOSCOPY  06/09/10   [id number] adenomas   . COLONOSCOPY N/A 08/19/2015   Procedure: COLONOSCOPY;  Surgeon: Danie Binder, MD;  [facility];  Service: Endoscopy;  Laterality: N/A;  130 - moved to 12:45 - Ginger notified pt  . COLONOSCOPY WITH PROPOFOL N/A 11/29/2018   Fields: Past simple adenomas removed, diverticulosis, hemorrhoids.  Next colonoscopy in 3 years.  Marland Kitchen DILATION AND CURETTAGE OF UTERUS    . DILATION AND CURETTAGE OF UTERUS  05/2002  . hystersonogram  2009  . MASTECTOMY  1999   left breast cancer   . POLYPECTOMY  11/29/2018   Procedure: POLYPECTOMY;  Surgeon: Danie Binder, MD;  [facility];  Service: Endoscopy;;  colon  . PORT-A-CATH REMOVAL  10/20/2011   Procedure: MINOR REMOVAL PORT-A-CATH;  Surgeon: Adin Hector, MD;  Location: [place];  Service: General;  Laterality: N/A;  Porta-cath removal  left  . PORTACATH PLACEMENT  03/27/2011   Procedure: INSERTION PORT-A-CATH;  Surgeon: Adin Hector, MD;  Location: [place];  Service: General;  Laterality: Left;  insert port a cath   Social History   Occupational History  . Not on file  Tobacco Use  . Smoking status: Never Smoker  . Smokeless tobacco: Never Used  Vaping Use  . Vaping Use: Never used  Substance and Sexual Activity  . Alcohol use: Yes    Alcohol/week: 2.0 standard drinks    Types: 2 Standard drinks or equivalent per week    Comment: occasional;   . Drug use: No  . Sexual activity: Yes    Birth control/protection: None

## 2019-10-20 ENCOUNTER — Encounter: Payer: Self-pay | Admitting: Physician Assistant

## 2019-10-20 ENCOUNTER — Ambulatory Visit (INDEPENDENT_AMBULATORY_CARE_PROVIDER_SITE_OTHER): Payer: 59 | Admitting: Physician Assistant

## 2019-10-20 VITALS — Ht 64.0 in | Wt 291.0 lb

## 2019-10-20 DIAGNOSIS — S86011D Strain of right Achilles tendon, subsequent encounter: Secondary | ICD-10-CM

## 2019-10-20 NOTE — Progress Notes (Signed)
Office Visit Note   Patient: Kathleen Reynolds           Date of Birth: [birth year]           MRN: [id number] Visit Date: 10/20/2019              Requested by: Fayrene Helper, MD [address], [place],  [place] [address] PCP: Fayrene Helper, MD  Chief Complaint  Patient presents with  . Right Achilles Tendon - Routine Post Op    09/22/19 right achilles tendon reconstruction       HPI: This is a pleasant 59 year old woman who is 4 weeks status post right Achilles tendon reconstruction.  She has been nonweightbearing in her boot with a heel lift.  She denies fever, chills, or calf pain  Assessment & Plan: Visit Diagnoses: No diagnosis found.  Plan: May bear weight as tolerated in the cam boot with a heel lift.  We will follow up in 2 weeks at which time she could start transitioning out of the boot and taking away the heel lift as tolerated.  I also instructed her in gentle range of motion exercises she is not to go past neutral position also she should begin scar massage and may use cocoa butter  Follow-Up Instructions: No follow-ups on file.   Ortho Exam  Patient is alert, oriented, no adenopathy, well-dressed, normal affect, normal respiratory effort. Overall well-healed surgical incision she does have some eschar.  There is an area of about 2 or 3 cm that had a very superficial dehiscence that has scabbed over.  She states she only gets a spot of drainage from this.  Achilles is intact.  She does not have any cellulitis swelling is well controlled compartments are soft and nontender  Imaging: No results found. No images are attached to the encounter.  Labs: Lab Results  Component Value Date   HGBA1C 5.9 (A) 07/18/2019   HGBA1C 6.1 (H) 01/12/2019   HGBA1C 6.1 (H) 06/07/2018   LABURIC 7.0 07/10/2010     Lab Results  Component Value Date   ALBUMIN 4.1 12/19/2018   ALBUMIN 4.1 05/24/2018   ALBUMIN 4.2 08/01/2014   LABURIC 7.0 07/10/2010    No  results found for: MG Lab Results  Component Value Date   VD25OH 33 07/11/2019   VD25OH 24.0 (L) 12/19/2018   VD25OH 25 (L) 01/28/2017    No results found for: PREALBUMIN CBC EXTENDED Latest Ref Rng & Units 09/22/2019 01/12/2019 12/19/2018  WBC 4.0 - 10.5 K/uL 5.2 5.2 4.2  RBC 3.87 - 5.11 MIL/uL 4.43 4.32 4.32  HGB 12.0 - 15.0 g/dL 13.5 13.2 13.3  HCT 36 - 46 % 41.0 39.9 40.2  PLT 150 - 400 K/uL PLATELETS APPEAR ADEQUATE 236 171  NEUTROABS 1.7 - 7.7 K/uL - - 2.1  LYMPHSABS 0.7 - 4.0 K/uL - - 1.7     Body mass index is 49.95 kg/m.  Orders:  No orders of the defined types were placed in this encounter.  No orders of the defined types were placed in this encounter.    Procedures: No procedures performed  Clinical Data: No additional findings.  ROS:  All other systems negative, except as noted in the HPI. Review of Systems  Objective: Vital Signs: Ht 5' 4" (1.626 m)   Wt 291 lb (132 kg)   LMP 03/27/2011   BMI 49.95 kg/m   Specialty Comments:  No specialty comments available.  PMFS History: Patient Active Problem List  Diagnosis Date Noted  . Achilles rupture, right, initial encounter   . Shingles rash 08/21/2019  . Hx of adenomatous colonic polyps 06/14/2019  . Hemorrhoid 06/14/2019  . History of colonic polyps 09/05/2018  . Family history of colon cancer 09/05/2018  . Trigger thumb, left thumb 11/28/2017  . Breast cancer, left breast ([place]) 08/16/2015  . Sciatica of right side associated with disorder of lumbar spine 08/27/2014  . Allergic rhinitis 07/03/2014  . Vertigo, intermittent 05/20/2014  . Nonspecific elevation of levels of transaminase or lactic acid dehydrogenase (LDH) 05/09/2014  . Rotator cuff syndrome 09/19/2013  . Metabolic syndrome X 67/67/2094  . Anemia 02/18/2012  . Obesity, morbid, BMI 40.0-49.9 ([place]) 02/18/2012  . OA (osteoarthritis) of knee 12/29/2011  . Chronic pain of right knee 12/14/2011  . BRCA1 positive 07/01/2011  .  Invasive ductal carcinoma of right breast, stage 1 02/18/2011  . Prediabetes 01/22/2011  . Headache 01/01/2010  . Thyroid nodule 05/17/2009  . Hyperlipidemia LDL goal <100 02/22/2007   Past Medical History:  Diagnosis Date  . Anemia LIFELONG   HEAVY MENSES MAR 2012 HB 13 MCV 86.7 FERRITIN  66  . Arthritis   . Breast cancer ([place])    Right  . Breast mass in female    right  . 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 Diabetes mellitus without complication ([place])    pre diabetic  . Hyperlipidemia   . Left rotator cuff tear   . Migraines   . Morbid obesity ([place])   . Personal history of chemotherapy   . Personal history of radiation therapy   . Pre-diabetes   . RUPTURE ROTATOR CUFF 06/08/2007   Qualifier: Diagnosis of  By: Aline Brochure MD, Dorothyann Peng    . Tendonitis, Achilles, right   . Urticaria     Family History  Problem Relation Age of Onset  . Hypertension Mother   . Cancer Father        lung cancer  . Diabetes Sister   . Cancer Brother        colon cancer, younger than the age of 4  . Heart disease Sister        heart attack  . Colon polyps Sister   . Asthma Sister   . Eczema Sister   . Allergic rhinitis Neg Hx   . Urticaria Neg Hx     Past Surgical History:  Procedure Laterality Date  . ACHILLES TENDON SURGERY Right 09/22/2019   Procedure: RIGHT ACHILLES TENDON RECONSTRUCTION;  Surgeon: Newt Minion, MD;  Location: [facility];  Service: Orthopedics;  Laterality: Right;  . BREAST BIOPSY  02/13/2011   Procedure: BREAST BIOPSY WITH NEEDLE LOCALIZATION;  Surgeon: Adin Hector, MD;  Location: [facility];  Service: General;  Laterality: Right;  . BREAST LUMPECTOMY Right 2013  . BREAST SURGERY  11/1997   Left breast cancer-mastectomy  . BREAST SURGERY  2/13-   rt axilly bx  . COLONOSCOPY  2004 DR. SMITH   No polyp, hemorrhoids  . COLONOSCOPY  06/09/10   [id number] adenomas   . COLONOSCOPY N/A 08/19/2015   Procedure: COLONOSCOPY;  Surgeon: Danie Binder, MD;  [facility];  Service: Endoscopy;  Laterality: N/A;  130 - moved to 12:45 - Ginger notified pt  . COLONOSCOPY WITH PROPOFOL N/A 11/29/2018   Fields: Past simple adenomas removed, diverticulosis, hemorrhoids.  Next colonoscopy in 3 years.  Marland Kitchen DILATION AND CURETTAGE OF UTERUS    . DILATION AND CURETTAGE OF UTERUS  05/2002  .  hystersonogram  2009  . MASTECTOMY  1999   left breast cancer   . POLYPECTOMY  11/29/2018   Procedure: POLYPECTOMY;  Surgeon: Danie Binder, MD;  [facility];  Service: Endoscopy;;  colon  . PORT-A-CATH REMOVAL  10/20/2011   Procedure: MINOR REMOVAL PORT-A-CATH;  Surgeon: Adin Hector, MD;  Location: [facility];  Service: General;  Laterality: N/A;  Porta-cath removal  left  . PORTACATH PLACEMENT  03/27/2011   Procedure: INSERTION PORT-A-CATH;  Surgeon: Adin Hector, MD;  Location: [facility];  Service: General;  Laterality: Left;  insert port a cath   Social History   Occupational History  . Not on file  Tobacco Use  . Smoking status: Never Smoker  . Smokeless tobacco: Never Used  Vaping Use  . Vaping Use: Never used  Substance and Sexual Activity  . Alcohol use: Yes    Alcohol/week: 2.0 standard drinks    Types: 2 Standard drinks or equivalent per week    Comment: occasional;   . Drug use: No  . Sexual activity: Yes    Birth control/protection: None

## 2019-10-27 ENCOUNTER — Other Ambulatory Visit: Payer: Self-pay | Admitting: Family Medicine

## 2019-11-03 ENCOUNTER — Encounter: Payer: Self-pay | Admitting: Family

## 2019-11-03 ENCOUNTER — Ambulatory Visit (INDEPENDENT_AMBULATORY_CARE_PROVIDER_SITE_OTHER): Payer: 59 | Admitting: Family

## 2019-11-03 VITALS — Ht 64.0 in | Wt 291.0 lb

## 2019-11-03 DIAGNOSIS — S86011A Strain of right Achilles tendon, initial encounter: Secondary | ICD-10-CM

## 2019-11-03 NOTE — Progress Notes (Signed)
Office Visit Note   Patient: Kathleen Reynolds           Date of Birth: 1960/04/10           MRN: [id number] Visit Date: 11/03/2019              Requested by: Fayrene Helper, MD [address], [place],  [place] [address] PCP: Fayrene Helper, MD  Chief Complaint  Patient presents with  . Right Achilles Tendon - Routine Post Op    09/22/19 right achilles tendon reconstruction       HPI: This is a pleasant 59 year old woman who is 6 weeks status post right Achilles tendon reconstruction.  She has been full weight bearing her CAM boot with a heel lift.  She denies fever, chills, or calf pain  Assessment & Plan: Visit Diagnoses: No diagnosis found.  Plan: May bear weight as tolerated in regular shoewear with 1 heel lift.  We will follow up in 2 weeks at which time she could start transitioning out of the lift into regular shoewear without a lift.  Ambulated around room today with 1 lift voiced no complaints of pain.  Plan to reevaluate her return to work in 2 weeks.  Again discussed the importance of scar massage  Follow-Up Instructions: No follow-ups on file.   Ortho Exam  Patient is alert, oriented, no adenopathy, well-dressed, normal affect, normal respiratory effort. Overall well-healed surgical incision she does have some eschar.  There is an area of about 1 cm that had a very superficial dehiscence that has gaped open.  This is filled in with full granulation tissue.  There is no probing no active drainage. she states she only gets a spot of drainage from this.  Achilles is intact.  She does not have any cellulitis swelling is well controlled compartments are soft and nontender  Imaging: No results found. No images are attached to the encounter.  Labs: Lab Results  Component Value Date   HGBA1C 5.9 (A) 07/18/2019   HGBA1C 6.1 (H) 01/12/2019   HGBA1C 6.1 (H) 06/07/2018   LABURIC 7.0 07/10/2010     Lab Results  Component Value Date   ALBUMIN 4.1  12/19/2018   ALBUMIN 4.1 05/24/2018   ALBUMIN 4.2 08/01/2014   LABURIC 7.0 07/10/2010    No results found for: MG Lab Results  Component Value Date   VD25OH 33 07/11/2019   VD25OH 24.0 (L) 12/19/2018   VD25OH 25 (L) 01/28/2017    No results found for: PREALBUMIN CBC EXTENDED Latest Ref Rng & Units 09/22/2019 01/12/2019 12/19/2018  WBC 4.0 - 10.5 K/uL 5.2 5.2 4.2  RBC 3.87 - 5.11 MIL/uL 4.43 4.32 4.32  HGB 12.0 - 15.0 g/dL 13.5 13.2 13.3  HCT 36 - 46 % 41.0 39.9 40.2  PLT 150 - 400 K/uL PLATELETS APPEAR ADEQUATE 236 171  NEUTROABS 1.7 - 7.7 K/uL - - 2.1  LYMPHSABS 0.7 - 4.0 K/uL - - 1.7     Body mass index is 49.95 kg/m.  Orders:  No orders of the defined types were placed in this encounter.  No orders of the defined types were placed in this encounter.    Procedures: No procedures performed  Clinical Data: No additional findings.  ROS:  All other systems negative, except as noted in the HPI. Review of Systems  Objective: Vital Signs: Ht 5' 4" (1.626 m)   Wt 291 lb (132 kg)   LMP 03/27/2011   BMI 49.95 kg/m  Specialty Comments:  No specialty comments available.  PMFS History: Patient Active Problem List   Diagnosis Date Noted  . Achilles rupture, right, initial encounter   . Shingles rash 08/21/2019  . Hx of adenomatous colonic polyps 06/14/2019  . Hemorrhoid 06/14/2019  . History of colonic polyps 09/05/2018  . Family history of colon cancer 09/05/2018  . Trigger thumb, left thumb 11/28/2017  . Breast cancer, left breast ([place]) 08/16/2015  . Sciatica of right side associated with disorder of lumbar spine 08/27/2014  . Allergic rhinitis 07/03/2014  . Vertigo, intermittent 05/20/2014  . Nonspecific elevation of levels of transaminase or lactic acid dehydrogenase (LDH) 05/09/2014  . Rotator cuff syndrome 09/19/2013  . Metabolic syndrome X 16/11/9602  . Anemia 02/18/2012  . Obesity, morbid, BMI 40.0-49.9 ([place]) 02/18/2012  . OA (osteoarthritis) of  knee 12/29/2011  . Chronic pain of right knee 12/14/2011  . BRCA1 positive 07/01/2011  . Invasive ductal carcinoma of right breast, stage 1 02/18/2011  . Prediabetes 01/22/2011  . Headache 01/01/2010  . Thyroid nodule 05/17/2009  . Hyperlipidemia LDL goal <100 02/22/2007   Past Medical History:  Diagnosis Date  . Anemia LIFELONG   HEAVY MENSES MAR 2012 HB 13 MCV 86.7 FERRITIN  66  . Arthritis   . Breast cancer ([place])    Right  . Breast mass in female    right  . 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 Diabetes mellitus without complication ([place])    pre diabetic  . Hyperlipidemia   . Left rotator cuff tear   . Migraines   . Morbid obesity ([place])   . Personal history of chemotherapy   . Personal history of radiation therapy   . Pre-diabetes   . RUPTURE ROTATOR CUFF 06/08/2007   Qualifier: Diagnosis of  By: Aline Brochure MD, Dorothyann Peng    . Tendonitis, Achilles, right   . Urticaria     Family History  Problem Relation Age of Onset  . Hypertension Mother   . Cancer Father        lung cancer  . Diabetes Sister   . Cancer Brother        colon cancer, younger than the age of 47  . Heart disease Sister        heart attack  . Colon polyps Sister   . Asthma Sister   . Eczema Sister   . Allergic rhinitis Neg Hx   . Urticaria Neg Hx     Past Surgical History:  Procedure Laterality Date  . ACHILLES TENDON SURGERY Right 09/22/2019   Procedure: RIGHT ACHILLES TENDON RECONSTRUCTION;  Surgeon: Newt Minion, MD;  Location: [facility] [facility];  Service: Orthopedics;  Laterality: Right;  . BREAST BIOPSY  02/13/2011   Procedure: BREAST BIOPSY WITH NEEDLE LOCALIZATION;  Surgeon: Adin Hector, MD;  Location: [facility];  Service: General;  Laterality: Right;  . BREAST LUMPECTOMY Right 2013  . BREAST SURGERY  11/1997   Left breast cancer-mastectomy  . BREAST SURGERY  2/13-   rt axilly bx  . COLONOSCOPY  2004 DR. SMITH   No polyp, hemorrhoids  . COLONOSCOPY  06/09/10   [id number] adenomas   .  COLONOSCOPY N/A 08/19/2015   Procedure: COLONOSCOPY;  Surgeon: Danie Binder, MD;  [facility];  Service: Endoscopy;  Laterality: N/A;  130 - moved to 12:45 - Ginger notified pt  . COLONOSCOPY WITH PROPOFOL N/A 11/29/2018   Fields: Past simple adenomas removed, diverticulosis, hemorrhoids.  Next colonoscopy in 3 years.  Marland Kitchen DILATION  AND CURETTAGE OF UTERUS    . DILATION AND CURETTAGE OF UTERUS  05/2002  . hystersonogram  2009  . MASTECTOMY  1999   left breast cancer   . POLYPECTOMY  11/29/2018   Procedure: POLYPECTOMY;  Surgeon: Danie Binder, MD;  [facility];  Service: Endoscopy;;  colon  . PORT-A-CATH REMOVAL  10/20/2011   Procedure: MINOR REMOVAL PORT-A-CATH;  Surgeon: Adin Hector, MD;  Location: [facility];  Service: General;  Laterality: N/A;  Porta-cath removal  left  . PORTACATH PLACEMENT  03/27/2011   Procedure: INSERTION PORT-A-CATH;  Surgeon: Adin Hector, MD;  Location: [facility];  Service: General;  Laterality: Left;  insert port a cath   Social History   Occupational History  . Not on file  Tobacco Use  . Smoking status: Never Smoker  . Smokeless tobacco: Never Used  Vaping Use  . Vaping Use: Never used  Substance and Sexual Activity  . Alcohol use: Yes    Alcohol/week: 2.0 standard drinks    Types: 2 Standard drinks or equivalent per week    Comment: occasional;   . Drug use: No  . Sexual activity: Yes    Birth control/protection: None

## 2019-11-17 ENCOUNTER — Encounter: Payer: Self-pay | Admitting: Family

## 2019-11-17 ENCOUNTER — Other Ambulatory Visit: Payer: Self-pay

## 2019-11-17 ENCOUNTER — Ambulatory Visit (INDEPENDENT_AMBULATORY_CARE_PROVIDER_SITE_OTHER): Payer: 59 | Admitting: Family

## 2019-11-17 DIAGNOSIS — S86011A Strain of right Achilles tendon, initial encounter: Secondary | ICD-10-CM

## 2019-11-22 NOTE — Progress Notes (Signed)
Office Visit Note   Patient: Kathleen Reynolds           Date of Birth: [birth year]           MRN: [id number] Visit Date: 11/17/2019              Requested by: Fayrene Helper, MD [address] [place],  [place] [id number] PCP: Fayrene Helper, MD  No chief complaint on file.     HPI: This is a pleasant 59 year old woman who is status post right Achilles tendon reconstruction.  She has been full weight bearing her regular shoe wear with one a heel lift.  She denies fever, chills, or calf pain  Ambulates with a limp. Remains out of work.   Assessment & Plan: Visit Diagnoses:  1. Achilles rupture, right, initial encounter     Plan: given 2 more lifts today for other shoe wear. Will continue advancing activities as tolerated. Follow up in office in 2 weeks. Plan to re evaluate return to work at that time.  Again discussed the importance of scar massage  Follow-Up Instructions: Return in about 2 weeks (around 12/01/2019).   Ortho Exam  Patient is alert, oriented, no adenopathy, well-dressed, normal affect, normal respiratory effort. Overall well-healed surgical incision. There is no erythema or open areas. no active drainage. Achilles is intact.  She does not have any cellulitis swelling is well controlled compartments are soft and nontender  Imaging: No results found. No images are attached to the encounter.  Labs: Lab Results  Component Value Date   HGBA1C 5.9 (A) 07/18/2019   HGBA1C 6.1 (H) 01/12/2019   HGBA1C 6.1 (H) 06/07/2018   LABURIC 7.0 07/10/2010     Lab Results  Component Value Date   ALBUMIN 4.1 12/19/2018   ALBUMIN 4.1 05/24/2018   ALBUMIN 4.2 08/01/2014   LABURIC 7.0 07/10/2010    No results found for: MG Lab Results  Component Value Date   VD25OH 33 07/11/2019   VD25OH 24.0 (L) 12/19/2018   VD25OH 25 (L) 01/28/2017    No results found for: PREALBUMIN CBC EXTENDED Latest Ref Rng & Units 09/22/2019 01/12/2019 12/19/2018    WBC 4.0 - 10.5 K/uL 5.2 5.2 4.2  RBC 3.87 - 5.11 MIL/uL 4.43 4.32 4.32  HGB 12.0 - 15.0 g/dL 13.5 13.2 13.3  HCT 36 - 46 % 41.0 39.9 40.2  PLT 150 - 400 K/uL PLATELETS APPEAR ADEQUATE 236 171  NEUTROABS 1.7 - 7.7 K/uL - - 2.1  LYMPHSABS 0.7 - 4.0 K/uL - - 1.7     There is no height or weight on file to calculate BMI.  Orders:  No orders of the defined types were placed in this encounter.  No orders of the defined types were placed in this encounter.    Procedures: No procedures performed  Clinical Data: No additional findings.  ROS:  All other systems negative, except as noted in the HPI. Review of Systems  Constitutional: Negative for chills and fever.  Musculoskeletal: Negative for myalgias.  Skin: Negative for color change and wound.    Objective: Vital Signs: LMP 03/27/2011   Specialty Comments:  No specialty comments available.  PMFS History: Patient Active Problem List   Diagnosis Date Noted  . Achilles rupture, right, initial encounter   . Shingles rash 08/21/2019  . Hx of adenomatous colonic polyps 06/14/2019  . Hemorrhoid 06/14/2019  . History of colonic polyps 09/05/2018  . Family history of colon cancer 09/05/2018  . Trigger  thumb, left thumb 11/28/2017  . Breast cancer, left breast ([place]) 08/16/2015  . Sciatica of right side associated with disorder of lumbar spine 08/27/2014  . Allergic rhinitis 07/03/2014  . Vertigo, intermittent 05/20/2014  . Nonspecific elevation of levels of transaminase or lactic acid dehydrogenase (LDH) 05/09/2014  . Rotator cuff syndrome 09/19/2013  . Metabolic syndrome X 51/88/4166  . Anemia 02/18/2012  . Obesity, morbid, BMI 40.0-49.9 ([facility]) 02/18/2012  . OA (osteoarthritis) of knee 12/29/2011  . Chronic pain of right knee 12/14/2011  . BRCA1 positive 07/01/2011  . Invasive ductal carcinoma of right breast, stage 1 02/18/2011  . Prediabetes 01/22/2011  . Headache 01/01/2010  . Thyroid nodule 05/17/2009  .  Hyperlipidemia LDL goal <100 02/22/2007   Past Medical History:  Diagnosis Date  . Anemia LIFELONG   HEAVY MENSES MAR 2012 HB 13 MCV 86.7 FERRITIN  66  . Arthritis   . Breast cancer ([place])    Right  . Breast mass in female    right  . 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 Diabetes mellitus without complication ([facility])    pre diabetic  . Hyperlipidemia   . Left rotator cuff tear   . Migraines   . Morbid obesity ([place])   . Personal history of chemotherapy   . Personal history of radiation therapy   . Pre-diabetes   . RUPTURE ROTATOR CUFF 06/08/2007   Qualifier: Diagnosis of  By: Aline Brochure MD, Dorothyann Peng    . Tendonitis, Achilles, right   . Urticaria     Family History  Problem Relation Age of Onset  . Hypertension Mother   . Cancer Father        lung cancer  . Diabetes Sister   . Cancer Brother        colon cancer, younger than the age of 50  . Heart disease Sister        heart attack  . Colon polyps Sister   . Asthma Sister   . Eczema Sister   . Allergic rhinitis Neg Hx   . Urticaria Neg Hx     Past Surgical History:  Procedure Laterality Date  . ACHILLES TENDON SURGERY Right 09/22/2019   Procedure: RIGHT ACHILLES TENDON RECONSTRUCTION;  Surgeon: Newt Minion, MD;  Location: [facility];  Service: Orthopedics;  Laterality: Right;  . BREAST BIOPSY  02/13/2011   Procedure: BREAST BIOPSY WITH NEEDLE LOCALIZATION;  Surgeon: Adin Hector, MD;  Location: [facility];  Service: General;  Laterality: Right;  . BREAST LUMPECTOMY Right 2013  . BREAST SURGERY  11/1997   Left breast cancer-mastectomy  . BREAST SURGERY  2/13-   rt axilly bx  . COLONOSCOPY  2004 DR. SMITH   No polyp, hemorrhoids  . COLONOSCOPY  06/09/10   AYT:[id number] adenomas   . COLONOSCOPY N/A 08/19/2015   Procedure: COLONOSCOPY;  Surgeon: Danie Binder, MD;  [facility];  Service: Endoscopy;  Laterality: N/A;  130 - moved to 12:45 - Ginger notified pt  . COLONOSCOPY WITH PROPOFOL N/A 11/29/2018    Fields: Past simple adenomas removed, diverticulosis, hemorrhoids.  Next colonoscopy in 3 years.  Marland Kitchen DILATION AND CURETTAGE OF UTERUS    . DILATION AND CURETTAGE OF UTERUS  05/2002  . hystersonogram  2009  . MASTECTOMY  1999   left breast cancer   . POLYPECTOMY  11/29/2018   Procedure: POLYPECTOMY;  Surgeon: Danie Binder, MD;  [facility];  Service: Endoscopy;;  colon  . PORT-A-CATH REMOVAL  10/20/2011  Procedure: MINOR REMOVAL PORT-A-CATH;  Surgeon: Adin Hector, MD;  Location: [facility];  Service: General;  Laterality: N/A;  Porta-cath removal  left  . PORTACATH PLACEMENT  03/27/2011   Procedure: INSERTION PORT-A-CATH;  Surgeon: Adin Hector, MD;  Location: [facility];  Service: General;  Laterality: Left;  insert port a cath   Social History   Occupational History  . Not on file  Tobacco Use  . Smoking status: Never Smoker  . Smokeless tobacco: Never Used  Vaping Use  . Vaping Use: Never used  Substance and Sexual Activity  . Alcohol use: Yes    Alcohol/week: 2.0 standard drinks    Types: 2 Standard drinks or equivalent per week    Comment: occasional;   . Drug use: No  . Sexual activity: Yes    Birth control/protection: None

## 2019-11-23 ENCOUNTER — Other Ambulatory Visit: Payer: Self-pay

## 2019-11-23 ENCOUNTER — Encounter: Payer: Self-pay | Admitting: Internal Medicine

## 2019-11-23 ENCOUNTER — Telehealth: Payer: Self-pay

## 2019-11-23 ENCOUNTER — Ambulatory Visit (INDEPENDENT_AMBULATORY_CARE_PROVIDER_SITE_OTHER): Payer: 59 | Admitting: Internal Medicine

## 2019-11-23 VITALS — BP 131/77 | HR 69 | Temp 97.6°F | Resp 18 | Ht 64.0 in | Wt 294.1 lb

## 2019-11-23 DIAGNOSIS — Z23 Encounter for immunization: Secondary | ICD-10-CM

## 2019-11-23 DIAGNOSIS — E559 Vitamin D deficiency, unspecified: Secondary | ICD-10-CM

## 2019-11-23 DIAGNOSIS — M1711 Unilateral primary osteoarthritis, right knee: Secondary | ICD-10-CM

## 2019-11-23 DIAGNOSIS — M7651 Patellar tendinitis, right knee: Secondary | ICD-10-CM | POA: Diagnosis not present

## 2019-11-23 MED ORDER — TRAMADOL HCL 50 MG PO TABS: 50.0000 mg | ORAL_TABLET | Freq: Three times a day (TID) | ORAL | 0 refills | Status: AC | PRN

## 2019-11-23 NOTE — Telephone Encounter (Signed)
Patient called and left vm stating she had right knee swelling and can't bear weight. Called patient and scheduled her to see Dr.Patel today at 3:20pm

## 2019-11-23 NOTE — Patient Instructions (Addendum)
Patellar Tendinitis  Patellar tendinitis is also called jumper's knee or patellar tendinopathy. This condition happens when there is damage to and inflammation of the patellar tendon. Tendons are cord-like tissues that connect muscles to bones. The patellar tendon connects the bottom of the kneecap (patella) to the top of the shin bone (tibia). Patellar tendinitis causes pain in the front of the knee. The condition happens in the following stages:  Stage 1: In this stage, you have pain only after activity.  Stage 2: In this stage, you have pain during and after activity.  Stage 3: In this stage, you have pain at rest as well as during and after activity. The pain limits your ability to do the activity.  Stage 4: In this stage, the tendon tears and severely limits your activity. What are the causes? This condition is caused by repeated (repetitive) stress on the tendon. This stress may cause the tendon to stretch, swell, thicken, or tear. What increases the risk? The following factors may make you more likely to develop this condition:  Participating in sports that involve running, kicking, and jumping, especially on hard surfaces. These include: ? Basketball. ? Volleyball. ? Soccer. ? Track and field.  Training too hard.  Having tight thigh muscles.  Having received steroid injections in the tendon.  Having had knee surgery.  Being 76-60 years old.  Having rheumatoid arthritis, diabetes, or kidney disease. These conditions interrupt blood flow to the knee, causing the tendon to weaken. What are the signs or symptoms? The main symptom of this condition is pain and swelling in the front of the knee. The pain usually starts slowly and gradually gets worse. It may become painful to straighten your leg. The pain may get worse when you walk, run, or jump. How is this diagnosed? This condition may be diagnosed based on:  Your symptoms.  Your medical history.  A physical exam.  During the physical exam, your health care provider may check for: ? Tenderness in your patella. ? Tightness in your thigh muscles. ? Pain when you straighten your knee. How is this treated? Treatment for this condition depends on the stage of the condition. It may involve:  Avoiding activities that cause pain, such as jumping.  Icing and elevating your knee.  Having sound wave stimulation to promote healing.  Doing stretching and strengthening exercises (physical therapy) when pain and swelling improve.  Wearing a knee brace. This may be needed if your condition does not improve with treatment.  Using crutches or a walker. This may be needed if your condition does not improve with treatment.  Surgery. This may be done if you have stage 4 tendinitis. Follow these instructions at home: If you have a brace:  Wear the brace as told by your health care provider. Remove it only as told by your health care provider.  Loosen the brace if your toes tingle, become numb, or turn cold and blue.  Keep the brace clean.  If the brace is not waterproof: ? Do not let it get wet. ? Cover it with a watertight covering when you take a bath or shower.  Ask your health care provider when it is safe for you to drive. Managing pain, stiffness, and swelling   If directed, put ice on the injured area. ? If you have a removable brace, remove it as told by your health care provider. ? Put ice in a plastic bag. ? Place a towel between your skin and the bag. ? Leave  the ice on for 20 minutes, 2-3 times a day.  Move your toes often to reduce stiffness and swelling.  Raise (elevate) your knee above the level of your heart while you are sitting or lying down. Activity  Do not use the injured limb to support your body weight until your health care provider says that you can. Use your crutches or a walker as told by your health care provider.  Return to your normal activities as told by your health  care provider. Ask your health care provider what activities are safe for you.  Do exercises as told by your health care provider or physical therapist. General instructions  Take over-the-counter and prescription medicines only as told by your health care provider.  Do not use any products that contain nicotine or tobacco, such as cigarettes, e-cigarettes, and chewing tobacco. These can delay healing. If you need help quitting, ask your health care provider.  Keep all follow-up visits as told by your health care provider. This is important. How is this prevented?  Warm up and stretch before being active.  Cool down and stretch after being active.  Give your body time to rest between periods of activity. ? You may need to reduce how often you play a sport that requires frequent jumping.  Make sure to use equipment that fits you.  Be safe and responsible while being active. This will help you avoid falls which can damage the tendon.  Do at least 150 minutes of moderate-intensity exercise each week, such as brisk walking or water aerobics.  Maintain physical fitness, including: ? Strength. ? Flexibility. ? Cardiovascular fitness. ? Endurance. Contact a health care provider if:  Your symptoms have not improved in 6 weeks.  Your symptoms get worse. Summary  Patellar tendinitis is also called jumper's knee or patellar tendinopathy. This condition happens when there is damage to and inflammation of the patellar tendon.  Treatment for this condition depends on the stage of the condition and may include rest, ice, exercises, medicines, and surgery.  Do not use the injured limb to support your body weight until your health care provider says that you can.  Take over-the-counter and prescription medicines only as told by your health care provider.  Keep all follow-up visits as told by your health care provider. This is important. This information is not intended to replace advice  given to you by your health care provider. Make sure you discuss any questions you have with your health care provider. Document Revised: 05/12/2018 Document Reviewed: 12/13/2017 Elsevier Patient Education  [place].

## 2019-11-26 DIAGNOSIS — E559 Vitamin D deficiency, unspecified: Secondary | ICD-10-CM | POA: Insufficient documentation

## 2019-11-26 NOTE — Assessment & Plan Note (Signed)
Last vitamin D Lab Results  Component Value Date   VD25OH 33 07/11/2019   Improving with Vit D 50,000 IU every week

## 2019-11-26 NOTE — Progress Notes (Signed)
Acute Office Visit  Subjective:    Patient ID: Kathleen Reynolds, female    DOB: 04-Jul-1960, 60 y.o.   MRN: [id number]  Chief Complaint  Patient presents with   Knee Pain    Right side    Knee Pain  The incident occurred 3 to 5 days ago. There was no injury mechanism. The pain is present in the right knee. The quality of the pain is described as aching. The pain is at a severity of 7/10. The pain has been intermittent since onset. Associated symptoms include an inability to bear weight. Pertinent negatives include no loss of sensation, numbness or tingling. The symptoms are aggravated by movement and weight bearing. She has tried acetaminophen and rest for the symptoms. The treatment provided moderate relief.   Patient is currently recovering from right ankle and Achilles tendon repair after a fall.  Patient has a follow-up appointment with her orthopedic surgeon in the next week.  Past Medical History:  Diagnosis Date   Anemia LIFELONG   HEAVY MENSES MAR 2012 HB 13 MCV 86.7 FERRITIN  66   Arthritis    Breast cancer (HCC)    Right   Breast mass in female    right   Colonic adenoma 02/05/2011   Diabetes mellitus without complication ([place])    pre diabetic   Hyperlipidemia    Left rotator cuff tear    Migraines    Morbid obesity ([place])    Personal history of chemotherapy    Personal history of radiation therapy    Pre-diabetes    RUPTURE ROTATOR CUFF 06/08/2007   Qualifier: Diagnosis of  By: Aline Brochure MD, Adele Dan, Achilles, right    Urticaria     Past Surgical History:  Procedure Laterality Date   ACHILLES TENDON SURGERY Right 09/22/2019   Procedure: RIGHT ACHILLES TENDON RECONSTRUCTION;  Surgeon: Newt Minion, MD;  Location: [facility];  Service: Orthopedics;  Laterality: Right;   BREAST BIOPSY  02/13/2011   Procedure: BREAST BIOPSY WITH NEEDLE LOCALIZATION;  Surgeon: Adin Hector, MD;  Location: [facility];  Service: General;   Laterality: Right;   BREAST LUMPECTOMY Right 2013   BREAST SURGERY  11/1997   Left breast cancer-mastectomy   BREAST SURGERY  2/13-   rt axilly bx   COLONOSCOPY  2004 DR. SMITH   No polyp, hemorrhoids   COLONOSCOPY  06/09/10   HEN:IDPOEU adenomas    COLONOSCOPY N/A 08/19/2015   Procedure: COLONOSCOPY;  Surgeon: Danie Binder, MD;  [facility];  Service: Endoscopy;  Laterality: N/A;  130 - moved to 12:45 - Ginger notified pt   COLONOSCOPY WITH PROPOFOL N/A 11/29/2018   Fields: Past simple adenomas removed, diverticulosis, hemorrhoids.  Next colonoscopy in 3 years.   DILATION AND CURETTAGE OF UTERUS     DILATION AND CURETTAGE OF UTERUS  05/2002   hystersonogram  2009   MASTECTOMY  1999   left breast cancer    POLYPECTOMY  11/29/2018   Procedure: POLYPECTOMY;  Surgeon: Danie Binder, MD;  [facility];  Service: Endoscopy;;  colon   PORT-A-CATH REMOVAL  10/20/2011   Procedure: MINOR REMOVAL PORT-A-CATH;  Surgeon: Adin Hector, MD;  Location: [facility];  Service: General;  Laterality: N/A;  Porta-cath removal  left   PORTACATH PLACEMENT  03/27/2011   Procedure: INSERTION PORT-A-CATH;  Surgeon: Adin Hector, MD;  Location: [facility];  Service: General;  Laterality: Left;  insert port a cath    Family History  Problem Relation Age of Onset   Hypertension Mother    Cancer Father        lung cancer   Diabetes Sister    Cancer Brother        colon cancer, younger than the age of 19   Heart disease Sister        heart attack   Colon polyps Sister    Asthma Sister    Eczema Sister    Allergic rhinitis Neg Hx    Urticaria Neg Hx     Social History   Socioeconomic History   Marital status: Single    Spouse name: Not on file   Number of children: Not on file   Years of education: Not on file   Highest education level: Not on file  Occupational History   Not on file  Tobacco Use    Smoking status: Never Smoker   Smokeless tobacco: Never Used  Vaping Use   Vaping Use: Never used  Substance and Sexual Activity   Alcohol use: Yes    Alcohol/week: 2.0 standard drinks    Types: 2 Standard drinks or equivalent per week    Comment: occasional;    Drug use: No   Sexual activity: Yes    Birth control/protection: None  Other Topics Concern   Not on file  Social History Narrative   Single, 1 kid-59 yo female-lives with mom   Employed as a CNA   Rare Etoh.   No tobacco products   Social Determinants of Health   Financial Resource Strain:    Difficulty of Paying Living Expenses: Not on file  Food Insecurity:    Worried About [place] in the Last Year: Not on file   Ran Out of Food in the Last Year: Not on file  Transportation Needs:    Lack of Transportation (Medical): Not on file   Lack of Transportation (Non-Medical): Not on file  Physical Activity:    Days of Exercise per Week: Not on file   Minutes of Exercise per Session: Not on file  Stress:    Feeling of Stress : Not on file  Social Connections:    Frequency of Communication with Friends and Family: Not on file   Frequency of Social Gatherings with Friends and Family: Not on file   Attends Religious Services: Not on file   Active Member of Clubs or Organizations: Not on file   Attends Archivist Meetings: Not on file   Marital Status: Not on file  Intimate Partner Violence:    Fear of Current or Ex-Partner: Not on file   Emotionally Abused: Not on file   Physically Abused: Not on file   Sexually Abused: Not on file    Outpatient Medications Prior to Visit  Medication Sig Dispense Refill   Ascorbic Acid (VITAMIN C) 500 MG CAPS Take 500 mg by mouth daily.      Biotin w/ Vitamins C & E (HAIR/SKIN/NAILS PO) Take 2 tablets by mouth daily.     butalbital-acetaminophen-caffeine (FIORICET) 50-325-40 MG tablet Take one tablet by mouth two times daily as needed,  for headache (Patient taking differently: Take 1-2 tablets by mouth 2 (two) times daily as needed for headache or migraine. ) 20 tablet 0   calcium carbonate (CALTRATE 600) 1500 (600 Ca) MG TABS tablet Take 600 mg of elemental calcium by mouth 2 (two) times daily with a meal.  ELDERBERRY PO Take 1,000 mg by mouth daily.     gabapentin (NEURONTIN) 100 MG capsule TAKE ONE CAPSULE BY MOUTH, THREE TIMES DAILY, AS NEEDED, FOR BURNING PAIN DUE TO SHINGLES 90 capsule 1   meclizine (ANTIVERT) 25 MG tablet TAKE 1 TABLET (25 MG TOTAL) BY MOUTH 3 (THREE) TIMES DAILY AS NEEDED FOR DIZZINESS. (Patient taking differently: Take 25 mg by mouth 2 (two) times daily as needed for dizziness. ) 30 tablet 2   metFORMIN (GLUCOPHAGE) 500 MG tablet TAKE 1 TABLET BY MOUTH TWICE A DAY WITH FOOD (Patient taking differently: Take 500 mg by mouth 2 (two) times daily with a meal. ) 180 tablet 1   Multiple Vitamin (MULITIVITAMIN WITH MINERALS) TABS Take 1 tablet by mouth daily.     Vitamin D, Ergocalciferol, (DRISDOL) 1.25 MG (50000 UNIT) CAPS capsule TAKE 1 CAPSULE BY MOUTH ONCE A WEEK 12 capsule 1   zinc gluconate 50 MG tablet Take 50 mg by mouth daily.     oxyCODONE-acetaminophen (PERCOCET) 5-325 MG tablet Take 1 tablet by mouth every 4 (four) hours as needed for severe pain. 30 tablet 0   No facility-administered medications prior to visit.    Allergies  Allergen Reactions   Nsaids Anaphylaxis    Ibuprofen required hospitalization in 2016 due to ibuprofen   Ibuprofen Itching    Review of Systems  Constitutional: Negative for chills and fever.  HENT: Negative for congestion, sinus pressure, sinus pain and sore throat.   Eyes: Negative for pain and discharge.  Respiratory: Negative for cough and shortness of breath.   Cardiovascular: Negative for chest pain and palpitations.  Gastrointestinal: Negative for abdominal pain, constipation, diarrhea, nausea and vomiting.  Endocrine: Negative for polydipsia  and polyuria.  Genitourinary: Negative for dysuria and hematuria.  Musculoskeletal: Positive for arthralgias (Right knee), back pain and joint swelling (Right knee). Negative for neck pain and neck stiffness.  Skin: Negative for rash.  Neurological: Negative for dizziness, tingling, syncope, weakness and numbness.  Psychiatric/Behavioral: Negative for agitation and behavioral problems.       Objective:    Physical Exam Vitals reviewed.  Constitutional:      General: She is not in acute distress.    Appearance: She is obese. She is not diaphoretic.  HENT:     Head: Normocephalic and atraumatic.     Nose: Nose normal.     Mouth/Throat:     Mouth: Mucous membranes are moist.  Eyes:     General: No scleral icterus.    Extraocular Movements: Extraocular movements intact.     Pupils: Pupils are equal, round, and reactive to light.  Cardiovascular:     Rate and Rhythm: Normal rate and regular rhythm.     Pulses: Normal pulses.     Heart sounds: Normal heart sounds. No murmur heard.  No friction rub. No gallop.   Pulmonary:     Breath sounds: Normal breath sounds. No wheezing or rales.  Abdominal:     Palpations: Abdomen is soft.     Tenderness: There is no abdominal tenderness.  Musculoskeletal:        General: Swelling (Right knee, no erythema) present. No tenderness (Right knee).     Cervical back: Neck supple. No tenderness.  Skin:    General: Skin is warm.     Findings: No rash.  Neurological:     General: No focal deficit present.     Mental Status: She is alert and oriented to person, place, and time.  Sensory: No sensory deficit.     Motor: No weakness.  Psychiatric:        Mood and Affect: Mood normal.        Behavior: Behavior normal.     BP 131/77 (BP Location: Right Arm, Patient Position: Sitting, Cuff Size: Normal)    Pulse 69    Temp 97.6 F (36.4 C) (Temporal)    Resp 18    Ht 5\' 4"  (1.626 m)    Wt 294 lb 1.9 oz (133.4 kg)    LMP 03/27/2011    SpO2 97%     BMI 50.49 kg/m  Wt Readings from Last 3 Encounters:  11/23/19 294 lb 1.9 oz (133.4 kg)  11/03/19 291 lb (132 kg)  10/20/19 291 lb (132 kg)    There are no preventive care reminders to display for this patient.  There are no preventive care reminders to display for this patient.   Lab Results  Component Value Date   TSH 3.46 07/11/2019   Lab Results  Component Value Date   WBC 5.2 09/22/2019   HGB 13.5 09/22/2019   HCT 41.0 09/22/2019   MCV 92.6 09/22/2019   PLT PLATELETS APPEAR ADEQUATE 09/22/2019   Lab Results  Component Value Date   NA 140 09/22/2019   K 4.0 09/22/2019   CO2 24 09/22/2019   GLUCOSE 94 09/22/2019   BUN 15 09/22/2019   CREATININE 0.75 09/22/2019   BILITOT 0.3 01/12/2019   ALKPHOS 61 12/19/2018   AST 14 01/12/2019   ALT 17 01/12/2019   PROT 7.0 01/12/2019   ALBUMIN 4.1 12/19/2018   CALCIUM 9.5 09/22/2019   ANIONGAP 12 09/22/2019   Lab Results  Component Value Date   CHOL 208 (H) 07/11/2019   Lab Results  Component Value Date   HDL 55 07/11/2019   Lab Results  Component Value Date   LDLCALC 128 (H) 07/11/2019   Lab Results  Component Value Date   TRIG 132 07/11/2019   Lab Results  Component Value Date   CHOLHDL 3.8 07/11/2019   Lab Results  Component Value Date   HGBA1C 5.9 (A) 07/18/2019       Assessment & Plan:   Problem List Items Addressed This Visit      Musculoskeletal and Integument   OA (osteoarthritis) of knee - Primary    Acute on chronic pain, recent worsening of pain can be due to patellar tendonitis Could be related to gait change due to recent injury on ankle Prescribed Tramadol, can take tylenol in between Knee exercises discussed, local application of pain relieving cream and heating pads Has appointment with Orthopedic surgeon in the next week Weight loss importance emphasized      Relevant Medications   traMADol (ULTRAM) 50 MG tablet     Other   Obesity, morbid, BMI 40.0-49.9 (HCC)    Diet  modification and exercises discussed Counseled about weight loss to improve her chronic back pain and knee pain, patient expressed understanding      Vitamin D deficiency    Last vitamin D Lab Results  Component Value Date   VD25OH 33 07/11/2019   Improving with Vit D 50,000 IU every week        Other Visit Diagnoses    Patellar tendinitis of right knee       Relevant Medications   traMADol (ULTRAM) 50 MG tablet   Need for immunization against influenza       Relevant Orders   Flu Vaccine QUAD 36+ mos  IM (Completed)       Meds ordered this encounter  Medications   traMADol (ULTRAM) 50 MG tablet    Sig: Take 1 tablet (50 mg total) by mouth every 8 (eight) hours as needed for up to 7 days.    Dispense:  20 tablet    Refill:  0     Adonai Selsor Keith Rake, MD

## 2019-11-26 NOTE — Assessment & Plan Note (Signed)
Diet modification and exercises discussed Counseled about weight loss to improve her chronic back pain and knee pain, patient expressed understanding

## 2019-11-26 NOTE — Assessment & Plan Note (Addendum)
Acute on chronic pain, recent worsening of pain can be due to patellar tendonitis Could be related to gait change due to recent injury on ankle Prescribed Tramadol, can take tylenol in between Knee exercises discussed, local application of pain relieving cream and heating pads Has appointment with Orthopedic surgeon in the next week Weight loss importance emphasized

## 2019-12-01 ENCOUNTER — Other Ambulatory Visit: Payer: Self-pay

## 2019-12-01 ENCOUNTER — Ambulatory Visit: Payer: Self-pay

## 2019-12-01 ENCOUNTER — Ambulatory Visit (INDEPENDENT_AMBULATORY_CARE_PROVIDER_SITE_OTHER): Payer: 59 | Admitting: Family

## 2019-12-01 ENCOUNTER — Encounter: Payer: Self-pay | Admitting: Family

## 2019-12-01 ENCOUNTER — Ambulatory Visit (INDEPENDENT_AMBULATORY_CARE_PROVIDER_SITE_OTHER): Payer: 59

## 2019-12-01 VITALS — Ht 64.0 in | Wt 294.0 lb

## 2019-12-01 DIAGNOSIS — M25561 Pain in right knee: Secondary | ICD-10-CM

## 2019-12-01 DIAGNOSIS — G8929 Other chronic pain: Secondary | ICD-10-CM

## 2019-12-01 DIAGNOSIS — M25562 Pain in left knee: Secondary | ICD-10-CM

## 2019-12-01 NOTE — Progress Notes (Signed)
 Office Visit Note   Patient: Kathleen Reynolds           Date of Birth: 09/22/1960           MRN: [id number] Visit Date: 12/01/2019              Requested by: Simpson, Margaret E, MD [address], [address] [place],  [place] [address] PCP: Simpson, Margaret E, MD  Chief Complaint  Patient presents with  . Right Achilles Tendon - Routine Post Op    09/22/19 achilles tendon reconstruction   . Right Knee - Pain  . Left Knee - Pain      HPI: The patient is a 59-year-old woman who presents today for 2 separate issues.  She is status post right Achilles tendon reconstruction on August 28 of this year she has been full weightbearing in regular shoewear without a heel lift she feels well and has no concerns states that she will feel comfortable returning to work in 1 more week.  Note for work requested.  She is also complaining of bilateral knee pain right worse than left this is been ongoing for quite some time.  She is spoken with another orthopedic group which is recommended total joint replacement her pain has been worsening since her rehabilitation from the right Achilles.  She complains of anterior pain on the right pain with ambulation no locking or catching does occasionally have some giving way and excruciating pain with weightbearing start up stiffness.  She is used tramadol from her primary care provider with modest relief she has had previous cortisone injections which have also been somewhat helpful she is interested in discussing other options.  Assessment & Plan: Visit Diagnoses:  1. Chronic pain of both knees     Plan: Depo-Medrol injection bilateral knees.  We will also set up prior authorization for supplemental injection bilateral knees note provided that she may return to work on November 8  Follow-Up Instructions: No follow-ups on file.   Right Knee Exam   Muscle Strength  The patient has normal right knee strength.  Tenderness  The patient is experiencing  tenderness in the medial joint line.  Range of Motion  The patient has normal right knee ROM.  Tests  Varus: negative Valgus: negative  Other  Effusion: no effusion present   Left Knee Exam   Muscle Strength  The patient has normal left knee strength.  Tenderness  The patient is experiencing tenderness in the medial joint line.  Tests  Varus: negative Valgus: negative  Other  Effusion: no effusion present      Patient is alert, oriented, no adenopathy, well-dressed, normal affect, normal respiratory effort.   Imaging: No results found. No images are attached to the encounter.  Labs: Lab Results  Component Value Date   HGBA1C 5.9 (A) 07/18/2019   HGBA1C 6.1 (H) 01/12/2019   HGBA1C 6.1 (H) 06/07/2018   LABURIC 7.0 07/10/2010     Lab Results  Component Value Date   ALBUMIN 4.1 12/19/2018   ALBUMIN 4.1 05/24/2018   ALBUMIN 4.2 08/01/2014   LABURIC 7.0 07/10/2010    No results found for: MG Lab Results  Component Value Date   VD25OH 33 07/11/2019   VD25OH 24.0 (L) 12/19/2018   VD25OH 25 (L) 01/28/2017    No results found for: PREALBUMIN CBC EXTENDED Latest Ref Rng & Units 09/22/2019 01/12/2019 12/19/2018  WBC 4.0 - 10.5 K/uL 5.2 5.2 4.2  RBC 3.87 - 5.11 MIL/uL 4.43 4.32 4.32    HGB 12.0 - 15.0 g/dL 13.5 13.2 13.3  HCT 36 - 46 % 41.0 39.9 40.2  PLT 150 - 400 K/uL PLATELETS APPEAR ADEQUATE 236 171  NEUTROABS 1.7 - 7.7 K/uL - - 2.1  LYMPHSABS 0.7 - 4.0 K/uL - - 1.7     Body mass index is 50.46 kg/m.  Orders:  Orders Placed This Encounter  Procedures  . XR Knee 1-2 Views Right  . XR Knee 1-2 Views Left   No orders of the defined types were placed in this encounter.    Procedures: Large Joint Inj: bilateral knee on 12/01/2019 10:38 AM Indications: pain Details: 18 G 1.5 in needle, anteromedial approach Medications (Right): 5 mL lidocaine 1 %; 40 mg methylPREDNISolone acetate 40 MG/ML Medications (Left): 5 mL lidocaine 1 %; 40 mg  methylPREDNISolone acetate 40 MG/ML Consent was given by the patient.      Clinical Data: No additional findings.  ROS:  All other systems negative, except as noted in the HPI. Review of Systems  Constitutional: Negative for chills and fever.  Cardiovascular: Negative for leg swelling.  Musculoskeletal: Positive for arthralgias. Negative for joint swelling.  Skin: Negative for color change and wound.    Objective: Vital Signs: Ht 5' 4" (1.626 m)   Wt 294 lb (133.4 kg)   LMP 03/27/2011   BMI 50.46 kg/m   Specialty Comments:  No specialty comments available.  PMFS History: Patient Active Problem List   Diagnosis Date Noted  . Vitamin D deficiency 11/26/2019  . Achilles rupture, right, initial encounter   . Shingles rash 08/21/2019  . Hx of adenomatous colonic polyps 06/14/2019  . Hemorrhoid 06/14/2019  . History of colonic polyps 09/05/2018  . Family history of colon cancer 09/05/2018  . Trigger thumb, left thumb 11/28/2017  . Breast cancer, left breast (HCC) 08/16/2015  . Sciatica of right side associated with disorder of lumbar spine 08/27/2014  . Allergic rhinitis 07/03/2014  . Vertigo, intermittent 05/20/2014  . Nonspecific elevation of levels of transaminase or lactic acid dehydrogenase (LDH) 05/09/2014  . Rotator cuff syndrome 09/19/2013  . Metabolic syndrome X 07/02/2012  . Anemia 02/18/2012  . Obesity, morbid, BMI 40.0-49.9 (HCC) 02/18/2012  . OA (osteoarthritis) of knee 12/29/2011  . Chronic pain of right knee 12/14/2011  . BRCA1 positive 07/01/2011  . Invasive ductal carcinoma of right breast, stage 1 02/18/2011  . Prediabetes 01/22/2011  . Headache 01/01/2010  . Thyroid nodule 05/17/2009  . Hyperlipidemia LDL goal <100 02/22/2007   Past Medical History:  Diagnosis Date  . Anemia LIFELONG   HEAVY MENSES MAR 2012 HB 13 MCV 86.7 FERRITIN  66  . Arthritis   . Breast cancer (HCC)    Right  . Breast mass in female    right  . Colonic adenoma  02/05/2011  . Diabetes mellitus without complication (HCC)    pre diabetic  . Hyperlipidemia   . Left rotator cuff tear   . Migraines   . Morbid obesity (HCC)   . Personal history of chemotherapy   . Personal history of radiation therapy   . Pre-diabetes   . RUPTURE ROTATOR CUFF 06/08/2007   Qualifier: Diagnosis of  By: Harrison MD, Stanley    . Tendonitis, Achilles, right   . Urticaria     Family History  Problem Relation Age of Onset  . Hypertension Mother   . Cancer Father        lung cancer  . Diabetes Sister   . Cancer Brother          colon cancer, younger than the age of 55  . Heart disease Sister        heart attack  . Colon polyps Sister   . Asthma Sister   . Eczema Sister   . Allergic rhinitis Neg Hx   . Urticaria Neg Hx     Past Surgical History:  Procedure Laterality Date  . ACHILLES TENDON SURGERY Right 09/22/2019   Procedure: RIGHT ACHILLES TENDON RECONSTRUCTION;  Surgeon: Newt Minion, MD;  Location: [facility];  Service: Orthopedics;  Laterality: Right;  . BREAST BIOPSY  02/13/2011   Procedure: BREAST BIOPSY WITH NEEDLE LOCALIZATION;  Surgeon: Adin Hector, MD;  Location: [facility];  Service: General;  Laterality: Right;  . BREAST LUMPECTOMY Right 2013  . BREAST SURGERY  11/1997   Left breast cancer-mastectomy  . BREAST SURGERY  2/13-   rt axilly bx  . COLONOSCOPY  2004 DR. SMITH   No polyp, hemorrhoids  . COLONOSCOPY  06/09/10   [id number] adenomas   . COLONOSCOPY N/A 08/19/2015   Procedure: COLONOSCOPY;  Surgeon: Danie Binder, MD;  [facility];  Service: Endoscopy;  Laterality: N/A;  130 - moved to 12:45 - Ginger notified pt  . COLONOSCOPY WITH PROPOFOL N/A 11/29/2018   Fields: Past simple adenomas removed, diverticulosis, hemorrhoids.  Next colonoscopy in 3 years.  Marland Kitchen DILATION AND CURETTAGE OF UTERUS    . DILATION AND CURETTAGE OF UTERUS  05/2002  . hystersonogram  2009  . MASTECTOMY  1999   left breast cancer   . POLYPECTOMY   11/29/2018   Procedure: POLYPECTOMY;  Surgeon: Danie Binder, MD;  [facility];  Service: Endoscopy;;  colon  . PORT-A-CATH REMOVAL  10/20/2011   Procedure: MINOR REMOVAL PORT-A-CATH;  Surgeon: Adin Hector, MD;  Location: [facility];  Service: General;  Laterality: N/A;  Porta-cath removal  left  . PORTACATH PLACEMENT  03/27/2011   Procedure: INSERTION PORT-A-CATH;  Surgeon: Adin Hector, MD;  Location: [facility];  Service: General;  Laterality: Left;  insert port a cath   Social History   Occupational History  . Not on file  Tobacco Use  . Smoking status: Never Smoker  . Smokeless tobacco: Never Used  Vaping Use  . Vaping Use: Never used  Substance and Sexual Activity  . Alcohol use: Yes    Alcohol/week: 2.0 standard drinks    Types: 2 Standard drinks or equivalent per week    Comment: occasional;   . Drug use: No  . Sexual activity: Yes    Birth control/protection: None

## 2019-12-05 MED ORDER — LIDOCAINE HCL 1 % IJ SOLN
5.0000 mL | INTRAMUSCULAR | Status: AC | PRN
  Administered 2019-12-01: 5 mL

## 2019-12-05 MED ORDER — METHYLPREDNISOLONE ACETATE 40 MG/ML IJ SUSP
40.0000 mg | INTRAMUSCULAR | Status: AC | PRN
  Administered 2019-12-01: 40 mg via INTRA_ARTICULAR

## 2019-12-07 ENCOUNTER — Telehealth: Payer: Self-pay

## 2019-12-07 NOTE — Telephone Encounter (Signed)
-----   Message from Suzan Slick, NP sent at 12/01/2019 11:05 AM EDT ----- Bilateral knee supplemental injection

## 2019-12-07 NOTE — Telephone Encounter (Signed)
Noted  

## 2019-12-07 NOTE — Telephone Encounter (Signed)
Submitted VOB, SynviscOne, bilateral knee. 

## 2019-12-13 ENCOUNTER — Other Ambulatory Visit (HOSPITAL_COMMUNITY): Payer: Self-pay | Admitting: Hematology

## 2019-12-13 DIAGNOSIS — C50912 Malignant neoplasm of unspecified site of left female breast: Secondary | ICD-10-CM

## 2019-12-20 ENCOUNTER — Telehealth: Payer: Self-pay

## 2019-12-20 NOTE — Telephone Encounter (Signed)
PA required for SynviscOne, bilateral knee. Faxed completed PA form to [facility] at [phone]. 

## 2019-12-21 ENCOUNTER — Other Ambulatory Visit (HOSPITAL_COMMUNITY): Payer: Self-pay

## 2019-12-21 DIAGNOSIS — C50912 Malignant neoplasm of unspecified site of left female breast: Secondary | ICD-10-CM

## 2019-12-21 DIAGNOSIS — C50911 Malignant neoplasm of unspecified site of right female breast: Secondary | ICD-10-CM

## 2019-12-22 ENCOUNTER — Ambulatory Visit (HOSPITAL_COMMUNITY)
Admission: RE | Admit: 2019-12-22 | Discharge: 2019-12-22 | Disposition: A | Discharge status: Home / Self Care | Payer: 59 | Source: Ambulatory Visit | Attending: Nurse Practitioner | Admitting: Nurse Practitioner

## 2019-12-22 ENCOUNTER — Other Ambulatory Visit: Payer: Self-pay

## 2019-12-22 ENCOUNTER — Inpatient Hospital Stay (HOSPITAL_COMMUNITY): Payer: 59 | Attending: Hematology

## 2019-12-22 DIAGNOSIS — Z800 Family history of malignant neoplasm of digestive organs: Secondary | ICD-10-CM | POA: Insufficient documentation

## 2019-12-22 DIAGNOSIS — C50912 Malignant neoplasm of unspecified site of left female breast: Secondary | ICD-10-CM | POA: Diagnosis not present

## 2019-12-22 DIAGNOSIS — Z923 Personal history of irradiation: Secondary | ICD-10-CM | POA: Diagnosis not present

## 2019-12-22 DIAGNOSIS — Z7984 Long term (current) use of oral hypoglycemic drugs: Secondary | ICD-10-CM | POA: Diagnosis not present

## 2019-12-22 DIAGNOSIS — Z79899 Other long term (current) drug therapy: Secondary | ICD-10-CM | POA: Insufficient documentation

## 2019-12-22 DIAGNOSIS — Z8249 Family history of ischemic heart disease and other diseases of the circulatory system: Secondary | ICD-10-CM | POA: Diagnosis not present

## 2019-12-22 DIAGNOSIS — E785 Hyperlipidemia, unspecified: Secondary | ICD-10-CM | POA: Diagnosis not present

## 2019-12-22 DIAGNOSIS — Z853 Personal history of malignant neoplasm of breast: Secondary | ICD-10-CM | POA: Diagnosis present

## 2019-12-22 DIAGNOSIS — Z1501 Genetic susceptibility to malignant neoplasm of breast: Secondary | ICD-10-CM | POA: Insufficient documentation

## 2019-12-22 DIAGNOSIS — Z9012 Acquired absence of left breast and nipple: Secondary | ICD-10-CM | POA: Insufficient documentation

## 2019-12-22 DIAGNOSIS — Z9221 Personal history of antineoplastic chemotherapy: Secondary | ICD-10-CM | POA: Insufficient documentation

## 2019-12-22 DIAGNOSIS — Z171 Estrogen receptor negative status [ER-]: Secondary | ICD-10-CM

## 2019-12-22 DIAGNOSIS — Z801 Family history of malignant neoplasm of trachea, bronchus and lung: Secondary | ICD-10-CM | POA: Diagnosis not present

## 2019-12-22 DIAGNOSIS — Z1231 Encounter for screening mammogram for malignant neoplasm of breast: Secondary | ICD-10-CM | POA: Insufficient documentation

## 2019-12-22 LAB — CBC WITH DIFFERENTIAL/PLATELET
Abs Immature Granulocytes: 0.01 10*3/uL (ref 0.00–0.07)
Basophils Absolute: 0 10*3/uL (ref 0.0–0.1)
Basophils Relative: 1 %
Eosinophils Absolute: 0.1 10*3/uL (ref 0.0–0.5)
Eosinophils Relative: 1 %
HCT: 39.5 % (ref 36.0–46.0)
Hemoglobin: 13.3 g/dL (ref 12.0–15.0)
Immature Granulocytes: 0 %
Lymphocytes Relative: 40 %
Lymphs Abs: 1.7 10*3/uL (ref 0.7–4.0)
MCH: 32.2 pg (ref 26.0–34.0)
MCHC: 33.7 g/dL (ref 30.0–36.0)
MCV: 95.6 fL (ref 80.0–100.0)
Monocytes Absolute: 0.3 10*3/uL (ref 0.1–1.0)
Monocytes Relative: 7 %
Neutro Abs: 2.2 10*3/uL (ref 1.7–7.7)
Neutrophils Relative %: 51 %
Platelets: 183 10*3/uL (ref 150–400)
RBC: 4.13 MIL/uL (ref 3.87–5.11)
RDW: 14.6 % (ref 11.5–15.5)
WBC: 4.3 10*3/uL (ref 4.0–10.5)
nRBC: 0 % (ref 0.0–0.2)

## 2019-12-22 LAB — COMPREHENSIVE METABOLIC PANEL
ALT: 20 U/L (ref 0–44)
AST: 17 U/L (ref 15–41)
Albumin: 4.2 g/dL (ref 3.5–5.0)
Alkaline Phosphatase: 62 U/L (ref 38–126)
Anion gap: 10 (ref 5–15)
BUN: 16 mg/dL (ref 6–20)
CO2: 24 mmol/L (ref 22–32)
Calcium: 8.9 mg/dL (ref 8.9–10.3)
Chloride: 105 mmol/L (ref 98–111)
Creatinine, Ser: 0.66 mg/dL (ref 0.44–1.00)
GFR, Estimated: >60 mL/min (ref >60)
Glucose, Bld: 97 mg/dL (ref 70–99)
Potassium: 3.8 mmol/L (ref 3.5–5.1)
Sodium: 139 mmol/L (ref 135–145)
Total Bilirubin: 0.4 mg/dL (ref 0.3–1.2)
Total Protein: 7.3 g/dL (ref 6.5–8.1)

## 2019-12-22 LAB — VITAMIN B12: Vitamin B-12: 410 pg/mL (ref 180–914)

## 2019-12-22 LAB — LACTATE DEHYDROGENASE: LDH: 173 U/L (ref 98–192)

## 2019-12-25 ENCOUNTER — Inpatient Hospital Stay (HOSPITAL_BASED_OUTPATIENT_CLINIC_OR_DEPARTMENT_OTHER): Payer: 59 | Admitting: Hematology

## 2019-12-25 ENCOUNTER — Other Ambulatory Visit: Payer: Self-pay

## 2019-12-25 VITALS — BP 126/66 | HR 68 | Temp 97.3°F | Resp 17 | Wt 294.8 lb

## 2019-12-25 DIAGNOSIS — C50912 Malignant neoplasm of unspecified site of left female breast: Secondary | ICD-10-CM | POA: Diagnosis not present

## 2019-12-25 DIAGNOSIS — Z853 Personal history of malignant neoplasm of breast: Secondary | ICD-10-CM | POA: Diagnosis not present

## 2019-12-25 DIAGNOSIS — Z171 Estrogen receptor negative status [ER-]: Secondary | ICD-10-CM

## 2019-12-25 NOTE — Patient Instructions (Addendum)
[place] at [facility] Discharge Instructions  You were seen today by Dr. Delton Coombes. He went over your recent results and scans. You will be scheduled for a mammogram of your right breast after 12/21/2020. Dr. Delton Coombes will see you back in 1 year for labs and follow up.   Thank you for choosing [place] at [facility] to provide your oncology and hematology care.  To afford each patient quality time with our provider, please arrive at least 15 minutes before your scheduled appointment time.   If you have a lab appointment with the [facility] please come in thru the Main Entrance and check in at the main information desk  You need to re-schedule your appointment should you arrive 10 or more minutes late.  We strive to give you quality time with our providers, and arriving late affects you and other patients whose appointments are after yours.  Also, if you no show three or more times for appointments you may be dismissed from the clinic at the providers discretion.     Again, thank you for choosing [facility].  Our hope is that these requests will decrease the amount of time that you wait before being seen by our physicians.       _____________________________________________________________  Should you have questions after your visit to [facility], please contact our office at [phone] between the hours of 8:00 a.m. and 4:30 p.m.  Voicemails left after 4:00 p.m. will not be returned until the following business day.  For prescription refill requests, have your pharmacy contact our office and allow 72 hours.    Cancer Center Support Programs:   > Cancer Support Group  2nd Tuesday of the month 1pm-2pm, Journey Room

## 2019-12-25 NOTE — Progress Notes (Signed)
[facility] [address], [place] [address]   Patient Care Team: Fayrene Helper, MD as PCP - [facility], Marga Melnick, MD (Inactive) (Gastroenterology) Servando Salina, MD as Consulting Physician (Obstetrics and Gynecology) Kathleen Skates, MD as Consulting Physician (General Surgery) Carole Civil, MD as Consulting Physician (Orthopedic Surgery)  SUMMARY OF ONCOLOGIC HISTORY: Oncology History   No history exists.    CHIEF COMPLIANT: Follow-up for bilateral triple negative breast cancer   INTERVAL HISTORY: [name] is a 59 y.o. female here today for follow up of her bilateral triple negative breast cancer. Her last visit was on 12/22/2018 with Kathleen Reynolds.   Today she reports feeling well. She denies having any new issues since her last visit. She has not visited her breast surgeon in several years. She denies having any new pains. She is taking calcium BID and vitamin D weekly.  She reports multiple family members have breast and ovarian cancer, but denies prostate or pancreatic cancer.   REVIEW OF SYSTEMS:   Review of Systems  Constitutional: Positive for fatigue (75%). Negative for appetite change.  All other systems reviewed and are negative.   I have reviewed the past medical history, past surgical history, social history and family history with the patient and they are unchanged from previous note.   ALLERGIES:   is allergic to nsaids and ibuprofen.   MEDICATIONS:  Current Outpatient Medications  Medication Sig Dispense Refill  . Ascorbic Acid (VITAMIN C) 500 MG CAPS Take 500 mg by mouth daily.     . Biotin w/ Vitamins C & E (HAIR/SKIN/NAILS PO) Take 2 tablets by mouth daily.    . butalbital-acetaminophen-caffeine (FIORICET) 50-325-40 MG tablet Take one tablet by mouth two times daily as needed, for headache (Patient taking differently: Take 1-2 tablets by mouth 2 (two) times daily as needed for headache or  migraine. ) 20 tablet 0  . calcium carbonate (CALTRATE 600) 1500 (600 Ca) MG TABS tablet Take 600 mg of elemental calcium by mouth 2 (two) times daily with a meal.    . ELDERBERRY PO Take 1,000 mg by mouth daily.    Marland Kitchen gabapentin (NEURONTIN) 100 MG capsule TAKE ONE CAPSULE BY MOUTH, THREE TIMES DAILY, AS NEEDED, FOR BURNING PAIN DUE TO SHINGLES 90 capsule 1  . meclizine (ANTIVERT) 25 MG tablet TAKE 1 TABLET (25 MG TOTAL) BY MOUTH 3 (THREE) TIMES DAILY AS NEEDED FOR DIZZINESS. (Patient taking differently: Take 25 mg by mouth 2 (two) times daily as needed for dizziness. ) 30 tablet 2  . metFORMIN (GLUCOPHAGE) 500 MG tablet TAKE 1 TABLET BY MOUTH TWICE A DAY WITH FOOD (Patient taking differently: Take 500 mg by mouth 2 (two) times daily with a meal. ) 180 tablet 1  . Multiple Vitamin (MULITIVITAMIN WITH MINERALS) TABS Take 1 tablet by mouth daily.    . Vitamin D, Ergocalciferol, (DRISDOL) 1.25 MG (50000 UNIT) CAPS capsule TAKE 1 CAPSULE BY MOUTH ONCE A WEEK 12 capsule 1  . zinc gluconate 50 MG tablet Take 50 mg by mouth daily.     No current facility-administered medications for this visit.     PHYSICAL EXAMINATION: Performance status (ECOG): 1 - Symptomatic but completely ambulatory  Vitals:   12/25/19 1128  BP: 126/66  Pulse: 68  Resp: 17  Temp: (!) 97.3 F (36.3 C)  SpO2: 99%   Wt Readings from Last 3 Encounters:  12/25/19 294 lb 12.8 oz (133.7 kg)  12/01/19 294 lb (133.4 kg)  11/23/19 294 lb 1.9 oz (133.4 kg)   Physical Exam Vitals reviewed.  Constitutional:      Appearance: Normal appearance. She is obese.  Cardiovascular:     Rate and Rhythm: Normal rate and regular rhythm.     Pulses: Normal pulses.     Heart sounds: Normal heart sounds.  Pulmonary:     Effort: Pulmonary effort is normal.     Breath sounds: Normal breath sounds.  Chest:     Breasts:        Right: Normal. No swelling, inverted nipple, mass, nipple discharge, skin change or tenderness.        Left:  Absent. No tenderness.  Abdominal:     Palpations: Abdomen is soft. There is no hepatomegaly or mass.     Tenderness: There is no abdominal tenderness.     Hernia: No hernia is present.  Lymphadenopathy:     Cervical: No cervical adenopathy.     Upper Body:     Right upper body: No supraclavicular, axillary or pectoral adenopathy.     Left upper body: No supraclavicular, axillary or pectoral adenopathy.  Neurological:     General: No focal deficit present.     Mental Status: She is alert and oriented to person, place, and time.  Psychiatric:        Mood and Affect: Mood normal.        Behavior: Behavior normal.     Breast Exam Chaperone: Kathleen Antis, MD     LABORATORY DATA:  I have reviewed the data as listed CMP Latest Ref Rng & Units 12/22/2019 09/22/2019 07/11/2019  Glucose 70 - 99 mg/dL 97 94 100(H)  BUN 6 - 20 mg/dL 16 15 14   Creatinine 0.44 - 1.00 mg/dL 0.66 0.75 0.69  Sodium 135 - 145 mmol/L 139 140 140  Potassium 3.5 - 5.1 mmol/L 3.8 4.0 4.2  Chloride 98 - 111 mmol/L 105 104 107  CO2 22 - 32 mmol/L 24 24 23   Calcium 8.9 - 10.3 mg/dL 8.9 9.5 9.2  Total Protein 6.5 - 8.1 g/dL 7.3 - -  Total Bilirubin 0.3 - 1.2 mg/dL 0.4 - -  Alkaline Phos 38 - 126 U/L 62 - -  AST 15 - 41 U/L 17 - -  ALT 0 - 44 U/L 20 - -   No results found for: [id number] Lab Results  Component Value Date   WBC 4.3 12/22/2019   HGB 13.3 12/22/2019   HCT 39.5 12/22/2019   MCV 95.6 12/22/2019   PLT 183 12/22/2019   NEUTROABS 2.2 12/22/2019    ASSESSMENT:  1.  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 - Patient had a left mastectomy on 11/27/1997.  Showed a 2.3 cm primary mass with 7- lymph nodes.  BRCA 1 positive. -She was treated with AC x4 cycles followed by Taxol with 4 cycles. - Patient was diagnosed with right-sided triple negative breast cancer in 2013. - Dr. Fanny Reynolds performed a right lumpectomy  with sentinel node biopsy on 02/13/2011.  It showed triple negative breast cancer with negative lymph nodes.  Her Ki-67 marker was 54%. -A right mastectomy was recommended at this time but she chose to have the lumpectomy instead. - She has now status post 6 cycles of carboplatin and Taxotere and status post radiation. - Her last mammogram on 12/19/2018 showed B RADS category 1 negative.   2.  Bone health: -DEXA scan done  on 12/19/2018 showed T score of 0.6 which is normal.    PLAN:  1.  Bilateral triple negative breast cancer: -Physical exam today did not show any masses in the left mastectomy area. -Right breast has no palpable masses. -She had mammogram on 12/22/2019.  Results of the test are pending. -I have reviewed results of her CMP which was within normal limits.  LDH and CBC was normal. -RTC 1 year with repeat mammogram and labs.  2.  Bone health: -Continue calcium and vitamin D supplements.    Breast Cancer therapy associated bone loss: I have recommended calcium, Vitamin D and weight bearing exercises.   Orders Placed This Encounter  Procedures  . MM 3D SCREEN BREAST UNI RIGHT    Standing Status:   Future    Standing Expiration Date:   12/24/2020    Order Specific Question:   Reason for Exam (SYMPTOM  OR DIAGNOSIS REQUIRED)    Answer:   hx breast cancer, screening    Order Specific Question:   Is the patient pregnant?    Answer:   No    Order Specific Question:   Preferred imaging location?    Answer:   [facility]    Order Specific Question:   Release to patient    Answer:   Immediate  . CBC with Differential/Platelet    Standing Status:   Future    Standing Expiration Date:   12/24/2020  . Comprehensive metabolic panel    Standing Status:   Future    Standing Expiration Date:   12/24/2020  . VITAMIN D 25 Hydroxy (Vit-D Deficiency, Fractures)    Standing Status:   Future    Standing Expiration Date:   12/24/2020  . Vitamin B12    Standing Status:    Future    Standing Expiration Date:   12/24/2020  . Lactate dehydrogenase    Standing Status:   Future    Standing Expiration Date:   12/24/2020   The patient has a good understanding of the overall plan. she agrees with it. she will call with any problems that may develop before the next visit here.    Derek Jack, MD [place] [phone]   I, Kathleen Reynolds, am acting as a scribe for Dr. Sanda Linger.  I, Derek Jack MD, have reviewed the above documentation for accuracy and completeness, and I agree with the above.

## 2020-01-01 ENCOUNTER — Telehealth: Payer: Self-pay

## 2020-01-01 NOTE — Telephone Encounter (Signed)
Please see below and advise.

## 2020-01-01 NOTE — Telephone Encounter (Signed)
Received fax from [facility] stating that PA has been denied for SynviscOne, bilateral knee, due to not being covered by patient's health plan.  Please advise on next option for patient.  Thank you.

## 2020-01-03 NOTE — Telephone Encounter (Signed)
Called pt to advise. Appt made for 01/15/20 at 1:15

## 2020-01-15 ENCOUNTER — Encounter: Payer: Self-pay | Admitting: Orthopedic Surgery

## 2020-01-15 ENCOUNTER — Encounter: Payer: Self-pay | Admitting: Family Medicine

## 2020-01-15 ENCOUNTER — Ambulatory Visit (INDEPENDENT_AMBULATORY_CARE_PROVIDER_SITE_OTHER): Payer: 59 | Admitting: Orthopedic Surgery

## 2020-01-15 ENCOUNTER — Ambulatory Visit (INDEPENDENT_AMBULATORY_CARE_PROVIDER_SITE_OTHER): Payer: 59 | Admitting: Family Medicine

## 2020-01-15 ENCOUNTER — Other Ambulatory Visit: Payer: Self-pay

## 2020-01-15 VITALS — BP 130/78 | Resp 15 | Ht 64.0 in | Wt 293.0 lb

## 2020-01-15 VITALS — Ht 64.0 in | Wt 293.0 lb

## 2020-01-15 DIAGNOSIS — Z6841 Body Mass Index (BMI) 40.0 and over, adult: Secondary | ICD-10-CM

## 2020-01-15 DIAGNOSIS — E785 Hyperlipidemia, unspecified: Secondary | ICD-10-CM | POA: Diagnosis not present

## 2020-01-15 DIAGNOSIS — M25562 Pain in left knee: Secondary | ICD-10-CM

## 2020-01-15 DIAGNOSIS — G8929 Other chronic pain: Secondary | ICD-10-CM

## 2020-01-15 DIAGNOSIS — D509 Iron deficiency anemia, unspecified: Secondary | ICD-10-CM | POA: Diagnosis not present

## 2020-01-15 DIAGNOSIS — R7303 Prediabetes: Secondary | ICD-10-CM | POA: Diagnosis not present

## 2020-01-15 DIAGNOSIS — M25561 Pain in right knee: Secondary | ICD-10-CM

## 2020-01-15 DIAGNOSIS — E8881 Metabolic syndrome: Secondary | ICD-10-CM

## 2020-01-15 DIAGNOSIS — R42 Dizziness and giddiness: Secondary | ICD-10-CM

## 2020-01-15 DIAGNOSIS — M1711 Unilateral primary osteoarthritis, right knee: Secondary | ICD-10-CM | POA: Diagnosis not present

## 2020-01-15 LAB — POCT GLYCOSYLATED HEMOGLOBIN (HGB A1C): HbA1c, POC (prediabetic range): 6.2 % (ref 5.7–6.4)

## 2020-01-15 MED ORDER — BUTALBITAL-APAP-CAFFEINE 50-325-40 MG PO TABS: ORAL_TABLET | ORAL | 0 refills | Status: DC

## 2020-01-15 NOTE — Assessment & Plan Note (Signed)
  Patient re-educated about  the importance of commitment to a  minimum of 150 minutes of exercise per week as able.  The importance of healthy food choices with portion control discussed, as well as eating regularly and within a 12 hour window most days. The need to choose "clean , green" food 50 to 75% of the time is discussed, as well as to make water the primary drink and set a goal of 64 ounces water daily.    Weight /BMI 01/15/2020 01/15/2020 12/25/2019  WEIGHT 293 lb 293 lb 294 lb 12.8 oz  HEIGHT 5\' 4"  5\' 4"  -  BMI 50.29 kg/m2 50.29 kg/m2 50.6 kg/m2

## 2020-01-15 NOTE — Assessment & Plan Note (Signed)
Ortho eval today, encouraged to work on weight loss

## 2020-01-15 NOTE — Progress Notes (Signed)
Kathleen Reynolds     MRN: [id number]      DOB: Jul 18, 1960   HPI Kathleen Reynolds is here for follow up and re-evaluation of chronic medical conditions, medication management and review of any available recent lab and radiology data.  Preventive health is updated, specifically  Cancer screening and Immunization.   Torn right achilees tendon in 08/06/2019, had surgery, released to work 12/11/2019. c/o localized medial right knee pain peventing walking has appt today with Ortho, intra articular injection helped for 3 weeks  Motivated to cut carbs for weight loss ROS Denies recent fever or chills. Denies sinus pressure, nasal congestion, ear pain or sore throat. Denies chest congestion, productive cough or wheezing. Denies chest pains, palpitations and leg swelling Denies abdominal pain, nausea, vomiting,diarrhea or constipation.   Denies dysuria, frequency, hesitancy or incontinence. Denies joint pain, swelling and limitation in mobility. Denies headaches, seizures, numbness, or tingling. Denies depression, anxiety or insomnia. Denies skin break down or rash.   PE  BP 137/82   Resp 15   Ht 5\' 4"  (1.626 m)   Wt 293 lb (132.9 kg)   LMP 03/27/2011   BMI 50.29 kg/m   Patient alert and oriented and in no cardiopulmonary distress.  HEENT: No facial asymmetry, EOMI,     Neck supple .  Chest: Clear to auscultation bilaterally.  CVS: S1, S2 no murmurs, no S3.Regular rate.  ABD: Soft non tender.   Ext: No edema  MS: Adequate ROM spine, shoulders, hips and reduced in right knee with [point tenderness, medical aspect  Skin: Intact, no ulcerations or rash noted.  Psych: Good eye contact, normal affect. Memory intact not anxious or depressed appearing.  CNS: CN 2-12 intact, power,  normal throughout.no focal deficits noted.   Assessment & Plan  Hyperlipidemia LDL goal <100 Hyperlipidemia:Low fat diet discussed and encouraged.   Lipid Panel  Lab Results  Component Value  Date   CHOL 208 (H) 07/11/2019   HDL 55 07/11/2019   LDLCALC 128 (H) 07/11/2019   TRIG 132 07/11/2019   CHOLHDL 3.8 07/11/2019   Needs to reduce fried foods    Prediabetes Patient educated about the importance of limiting  Carbohydrate intake , the need to commit to daily physical activity for a minimum of 30 minutes , and to commit weight loss. The fact that changes in all these areas will reduce or eliminate all together the development of diabetes is stressed.  Deteriorated HBa1C up to 6.2  Diabetic Labs Latest Ref Rng & Units 01/15/2020 12/22/2019 09/22/2019 07/18/2019 07/11/2019  HbA1c 5.7 - 6.4 % 6.2 - - 5.9(A) -  Chol <200 mg/dL - - - - 208(H)  HDL > OR = 50 mg/dL - - - - 55  Calc LDL mg/dL (calc) - - - - 128(H)  Triglycerides <150 mg/dL - - - - 132  Creatinine 0.44 - 1.00 mg/dL - 0.66 0.75 - 0.69   BP/Weight 01/15/2020 01/15/2020 12/25/2019 12/01/2019 11/23/2019 11/03/2019 4/70/9628  Systolic BP - 366 294 - 765 - -  Diastolic BP - 78 66 - 77 - -  Wt. (Lbs) 293 293 294.8 294 294.12 291 291  BMI 50.29 50.29 50.6 50.46 50.49 49.95 49.95   No flowsheet data found.    Vertigo, intermittent No episode in the past 3 months, uses antivert as needed  Obesity, morbid, BMI 40.0-49.9 ([place])  Patient re-educated about  the importance of commitment to a  minimum of 150 minutes of exercise per week as able.  The importance  of healthy food choices with portion control discussed, as well as eating regularly and within a 12 hour window most days. The need to choose "clean , green" food 50 to 75% of the time is discussed, as well as to make water the primary drink and set a goal of 64 ounces water daily.    Weight /BMI 01/15/2020 01/15/2020 12/25/2019  WEIGHT 293 lb 293 lb 294 lb 12.8 oz  HEIGHT 5\' 4"  5\' 4"  -  BMI 50.29 kg/m2 50.29 kg/m2 50.6 kg/m2      Metabolic syndrome X The increased risk of cardiovascular disease associated with this diagnosis, and the need to consistently  work on lifestyle to change this is discussed. Following  a  heart healthy diet ,commitment to 30 minutes of exercise at least 5 days per week, as well as control of blood sugar and cholesterol , and achieving a healthy weight are all the areas to be addressed .   Osteoarthritis of right knee Ortho eval today, encouraged to work on weight loss

## 2020-01-15 NOTE — Assessment & Plan Note (Signed)
The increased risk of cardiovascular disease associated with this diagnosis, and the need to consistently work on lifestyle to change this is discussed. Following  a  heart healthy diet ,commitment to 30 minutes of exercise at least 5 days per week, as well as control of blood sugar and cholesterol , and achieving a healthy weight are all the areas to be addressed .  

## 2020-01-15 NOTE — Assessment & Plan Note (Signed)
No episode in the past 3 months, uses antivert as needed

## 2020-01-15 NOTE — Assessment & Plan Note (Signed)
Patient educated about the importance of limiting  Carbohydrate intake , the need to commit to daily physical activity for a minimum of 30 minutes , and to commit weight loss. The fact that changes in all these areas will reduce or eliminate all together the development of diabetes is stressed.  Deteriorated HBa1C up to 6.2  Diabetic Labs Latest Ref Rng & Units 01/15/2020 12/22/2019 09/22/2019 07/18/2019 07/11/2019  HbA1c 5.7 - 6.4 % 6.2 - - 5.9(A) -  Chol <200 mg/dL - - - - 208(H)  HDL > OR = 50 mg/dL - - - - 55  Calc LDL mg/dL (calc) - - - - 128(H)  Triglycerides <150 mg/dL - - - - 132  Creatinine 0.44 - 1.00 mg/dL - 0.66 0.75 - 0.69   BP/Weight 01/15/2020 01/15/2020 12/25/2019 12/01/2019 11/23/2019 11/03/2019 0/16/0109  Systolic BP - 323 557 - 322 - -  Diastolic BP - 78 66 - 77 - -  Wt. (Lbs) 293 293 294.8 294 294.12 291 291  BMI 50.29 50.29 50.6 50.46 50.49 49.95 49.95   No flowsheet data found.

## 2020-01-15 NOTE — Patient Instructions (Addendum)
F/U in 4 months, re evaluae blood sugar and weight , cholesterol and vit D  Please commit to daily metformin and limit carb per meal to 30 to 45 g max Nurse please give sheet and educate patient  Fasting lipid, chem 7 and EGFr, HBA1C and vit D 1 week before follow up   Please get booster and schedule pap    It is important that you exercise regularly at least 30 minutes 5 times a week. If you develop chest pain, have severe difficulty breathing, or feel very tired, stop exercising immediately and seek medical attention   Think about what you will eat, plan ahead. Choose " clean, green, fresh or frozen" over canned, processed or packaged foods which are more sugary, salty and fatty. 70 to 75% of food eaten should be vegetables and fruit. Three meals at set times with snacks allowed between meals, but they must be fruit or vegetables. Aim to eat over a 12 hour period , example 7 am to 7 pm, and STOP after  your last meal of the day. Drink water,generally about 64 ounces per day, no other drink is as healthy. Fruit juice is best enjoyed in a healthy way, by EATING the fruit.   Thanks for choosing [facility], we consider it a privelige to serve you.   You are being referred for evaluation of sleep apnea which needs treatment if you have it, to protect your heart, lungs and brain

## 2020-01-15 NOTE — Assessment & Plan Note (Signed)
Hyperlipidemia:Low fat diet discussed and encouraged.   Lipid Panel  Lab Results  Component Value Date   CHOL 208 (H) 07/11/2019   HDL 55 07/11/2019   LDLCALC 128 (H) 07/11/2019   TRIG 132 07/11/2019   CHOLHDL 3.8 07/11/2019   Needs to reduce fried foods

## 2020-01-16 ENCOUNTER — Encounter: Payer: Self-pay | Admitting: Orthopedic Surgery

## 2020-01-16 NOTE — Progress Notes (Signed)
Office Visit Note   Patient: Kathleen Reynolds           Date of Birth: [birth year]           MRN: [id number] Visit Date: 01/15/2020              Requested by: Fayrene Helper, MD [address] [place],  [place] [address] PCP: Fayrene Helper, MD  Chief Complaint  Patient presents with  . Left Knee - Follow-up    S/p cortisone injection 12/01/19  . Right Knee - Follow-up      HPI: Patient is a 59 year old woman who presents in follow-up for osteoarthritis of both knees.  Patient states she has pain with activities of daily living.  Patient states that she has started a weight loss program with her primary care physician she states she is not interested in a formalized weight loss program.  Assessment & Plan: Visit Diagnoses:  1. Body mass index 50.0-59.9, adult ([place])   2. Morbid obesity ([place])   3. Chronic pain of both knees     Plan: Recommended strengthening as well as Voltaren gel topically for her knees.  Patient will need total knee arthroplasty when her BMI gets closer to 40.  Follow-Up Instructions: Return if symptoms worsen or fail to improve.   Ortho Exam  Patient is alert, oriented, no adenopathy, well-dressed, normal affect, normal respiratory effort. Examination patient has an antalgic gait she has crepitation with range of motion of both knees.  Is no redness no cellulitis no signs of infection.  Patient has varus alignment with both knees radiographs shows tricompartmental arthritis with bone-on-bone contact medial joint line with periarticular bony spurs in all compartments.  Patient states that her most recent BMI was 50.29  Imaging: No results found. No images are attached to the encounter.  Labs: Lab Results  Component Value Date   HGBA1C 6.2 01/15/2020   HGBA1C 5.9 (A) 07/18/2019   HGBA1C 6.1 (H) 01/12/2019   LABURIC 7.0 07/10/2010     Lab Results  Component Value Date   ALBUMIN 4.2 12/22/2019   ALBUMIN 4.1 12/19/2018    ALBUMIN 4.1 05/24/2018   LABURIC 7.0 07/10/2010    No results found for: MG Lab Results  Component Value Date   VD25OH 33 07/11/2019   VD25OH 24.0 (L) 12/19/2018   VD25OH 25 (L) 01/28/2017    No results found for: PREALBUMIN CBC EXTENDED Latest Ref Rng & Units 12/22/2019 09/22/2019 01/12/2019  WBC 4.0 - 10.5 K/uL 4.3 5.2 5.2  RBC 3.87 - 5.11 MIL/uL 4.13 4.43 4.32  HGB 12.0 - 15.0 g/dL 13.3 13.5 13.2  HCT 36.0 - 46.0 % 39.5 41.0 39.9  PLT 150 - 400 K/uL 183 PLATELETS APPEAR ADEQUATE 236  NEUTROABS 1.7 - 7.7 K/uL 2.2 - -  LYMPHSABS 0.7 - 4.0 K/uL 1.7 - -     Body mass index is 50.29 kg/m.  Orders:  No orders of the defined types were placed in this encounter.  No orders of the defined types were placed in this encounter.    Procedures: No procedures performed  Clinical Data: No additional findings.  ROS:  All other systems negative, except as noted in the HPI. Review of Systems  Objective: Vital Signs: Ht 5' 4"  (1.626 m)   Wt 293 lb (132.9 kg)   LMP 03/27/2011   BMI 50.29 kg/m   Specialty Comments:  No specialty comments available.  PMFS History: Patient Active Problem List   Diagnosis  Date Noted  . Osteoarthritis of right knee 01/15/2020  . Vitamin D deficiency 11/26/2019  . Achilles rupture, right, initial encounter   . Shingles rash 08/21/2019  . Hx of adenomatous colonic polyps 06/14/2019  . Hemorrhoid 06/14/2019  . History of colonic polyps 09/05/2018  . Family history of colon cancer 09/05/2018  . Trigger thumb, left thumb 11/28/2017  . Breast cancer, left breast ([place]) 08/16/2015  . Sciatica of right side associated with disorder of lumbar spine 08/27/2014  . Allergic rhinitis 07/03/2014  . Vertigo, intermittent 05/20/2014  . Nonspecific elevation of levels of transaminase or lactic acid dehydrogenase (LDH) 05/09/2014  . Rotator cuff syndrome 09/19/2013  . Metabolic syndrome X 53/97/6734  . Anemia 02/18/2012  . Obesity, morbid, BMI  40.0-49.9 ([place]) 02/18/2012  . OA (osteoarthritis) of knee 12/29/2011  . Chronic pain of right knee 12/14/2011  . BRCA1 positive 07/01/2011  . Invasive ductal carcinoma of right breast, stage 1 02/18/2011  . Prediabetes 01/22/2011  . Headache 01/01/2010  . Thyroid nodule 05/17/2009  . Hyperlipidemia LDL goal <100 02/22/2007   Past Medical History:  Diagnosis Date  . Anemia LIFELONG   HEAVY MENSES MAR 2012 HB 13 MCV 86.7 FERRITIN  66  . Arthritis   . Breast cancer ([place])    Right  . Breast mass in female    right  . 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 Diabetes mellitus without complication ([place])    pre diabetic  . Hyperlipidemia   . Left rotator cuff tear   . Migraines   . Morbid obesity ([place])   . Personal history of chemotherapy   . Personal history of radiation therapy   . Pre-diabetes   . RUPTURE ROTATOR CUFF 06/08/2007   Qualifier: Diagnosis of  By: Aline Brochure MD, Dorothyann Peng    . Tendonitis, Achilles, right   . Urticaria     Family History  Problem Relation Age of Onset  . Hypertension Mother   . Cancer Father        lung cancer  . Diabetes Sister   . Cancer Brother        colon cancer, younger than the age of 1  . Heart disease Sister        heart attack  . Colon polyps Sister   . Asthma Sister   . Eczema Sister   . Allergic rhinitis Neg Hx   . Urticaria Neg Hx     Past Surgical History:  Procedure Laterality Date  . ACHILLES TENDON SURGERY Right 09/22/2019   Procedure: RIGHT ACHILLES TENDON RECONSTRUCTION;  Surgeon: Newt Minion, MD;  Location: [facility];  Service: Orthopedics;  Laterality: Right;  . BREAST BIOPSY  02/13/2011   Procedure: BREAST BIOPSY WITH NEEDLE LOCALIZATION;  Surgeon: Adin Hector, MD;  Location: [facility];  Service: General;  Laterality: Right;  . BREAST LUMPECTOMY Right 2013  . BREAST SURGERY  11/1997   Left breast cancer-mastectomy  . BREAST SURGERY  2/13-   rt axilly bx  . COLONOSCOPY  2004 DR. SMITH   No polyp, hemorrhoids   . COLONOSCOPY  06/09/10   [id number] adenomas   . COLONOSCOPY N/A 08/19/2015   Procedure: COLONOSCOPY;  Surgeon: Danie Binder, MD;  [facility];  Service: Endoscopy;  Laterality: N/A;  130 - moved to 12:45 - Ginger notified pt  . COLONOSCOPY WITH PROPOFOL N/A 11/29/2018   Fields: Past simple adenomas removed, diverticulosis, hemorrhoids.  Next colonoscopy in 3 years.  Marland Kitchen DILATION AND CURETTAGE OF UTERUS    .  DILATION AND CURETTAGE OF UTERUS  05/2002  . hystersonogram  2009  . MASTECTOMY  1999   left breast cancer   . POLYPECTOMY  11/29/2018   Procedure: POLYPECTOMY;  Surgeon: Danie Binder, MD;  [facility];  Service: Endoscopy;;  colon  . PORT-A-CATH REMOVAL  10/20/2011   Procedure: MINOR REMOVAL PORT-A-CATH;  Surgeon: Adin Hector, MD;  Location: [facility];  Service: General;  Laterality: N/A;  Porta-cath removal  left  . PORTACATH PLACEMENT  03/27/2011   Procedure: INSERTION PORT-A-CATH;  Surgeon: Adin Hector, MD;  Location: [facility];  Service: General;  Laterality: Left;  insert port a cath   Social History   Occupational History  . Not on file  Tobacco Use  . Smoking status: Never Smoker  . Smokeless tobacco: Never Used  Vaping Use  . Vaping Use: Never used  Substance and Sexual Activity  . Alcohol use: Yes    Alcohol/week: 2.0 standard drinks    Types: 2 Standard drinks or equivalent per week    Comment: occasional;   . Drug use: No  . Sexual activity: Yes    Birth control/protection: None

## 2020-05-08 LAB — BASIC METABOLIC PANEL
BUN/Creatinine Ratio: 17 (ref 9–23)
BUN: 12 mg/dL (ref 6–24)
CO2: 19 mmol/L — ABNORMAL LOW (ref 20–29)
Calcium: 9.2 mg/dL (ref 8.7–10.2)
Chloride: 104 mmol/L (ref 96–106)
Creatinine, Ser: 0.7 mg/dL (ref 0.57–1.00)
Glucose: 96 mg/dL (ref 65–99)
Potassium: 4.1 mmol/L (ref 3.5–5.2)
Sodium: 140 mmol/L (ref 134–144)
eGFR: 100 mL/min/{1.73_m2} (ref >59)

## 2020-05-08 LAB — HEMOGLOBIN A1C
Est. average glucose Bld gHb Est-mCnc: 128 mg/dL
Hgb A1c MFr Bld: 6.1 % — ABNORMAL HIGH (ref 4.8–5.6)

## 2020-05-08 LAB — LIPID PANEL
Chol/HDL Ratio: 3.7 ratio (ref 0.0–4.4)
Cholesterol, Total: 211 mg/dL — ABNORMAL HIGH (ref 100–199)
HDL: 57 mg/dL (ref >39)
LDL Chol Calc (NIH): 136 mg/dL — ABNORMAL HIGH (ref 0–99)
Triglycerides: 101 mg/dL (ref 0–149)
VLDL Cholesterol Cal: 18 mg/dL (ref 5–40)

## 2020-05-08 LAB — VITAMIN D 25 HYDROXY (VIT D DEFICIENCY, FRACTURES): Vit D, 25-Hydroxy: 32.7 ng/mL (ref 30.0–100.0)

## 2020-05-14 ENCOUNTER — Encounter: Payer: Self-pay | Admitting: Family Medicine

## 2020-05-14 ENCOUNTER — Ambulatory Visit (INDEPENDENT_AMBULATORY_CARE_PROVIDER_SITE_OTHER): Payer: 59 | Admitting: Family Medicine

## 2020-05-14 ENCOUNTER — Other Ambulatory Visit: Payer: Self-pay

## 2020-05-14 VITALS — BP 136/80 | HR 63 | Resp 16 | Ht 64.0 in | Wt 286.0 lb

## 2020-05-14 DIAGNOSIS — D509 Iron deficiency anemia, unspecified: Secondary | ICD-10-CM

## 2020-05-14 DIAGNOSIS — Z23 Encounter for immunization: Secondary | ICD-10-CM | POA: Diagnosis not present

## 2020-05-14 DIAGNOSIS — E559 Vitamin D deficiency, unspecified: Secondary | ICD-10-CM

## 2020-05-14 DIAGNOSIS — R7301 Impaired fasting glucose: Secondary | ICD-10-CM

## 2020-05-14 DIAGNOSIS — E8881 Metabolic syndrome: Secondary | ICD-10-CM

## 2020-05-14 DIAGNOSIS — E041 Nontoxic single thyroid nodule: Secondary | ICD-10-CM

## 2020-05-14 DIAGNOSIS — E785 Hyperlipidemia, unspecified: Secondary | ICD-10-CM | POA: Diagnosis not present

## 2020-05-14 DIAGNOSIS — R7303 Prediabetes: Secondary | ICD-10-CM

## 2020-05-14 MED ORDER — METFORMIN HCL 500 MG PO TABS: 500.0000 mg | ORAL_TABLET | Freq: Every day | ORAL | 3 refills | Status: DC

## 2020-05-14 NOTE — Patient Instructions (Addendum)
F/U in office with MD re evaluate weight and labs, in 6 months and flu vaccine at visit, call if you need me before  Nurse visit in 2 to 3 months for shingrix #2  CONGRATS on weight loss, keep it up, goal of 3 to 4 pound weight loss per month  Please get fasting lipid, chem7 and eGGFR, HBA1C, TSH, CBC 5 days before next visit  Covid  Booster is due May 5, please get this  Shingrix vaccine today    Calorie Counting for Weight Loss Calories are units of energy. Your body needs a certain number of calories from food to keep going throughout the day. When you eat or drink more calories than your body needs, your body stores the extra calories mostly as fat. When you eat or drink fewer calories than your body needs, your body burns fat to get the energy it needs. Calorie counting means keeping track of how many calories you eat and drink each day. Calorie counting can be helpful if you need to lose weight. If you eat fewer calories than your body needs, you should lose weight. Ask your health care provider what a healthy weight is for you. For calorie counting to work, you will need to eat the right number of calories each day to lose a healthy amount of weight per week. A dietitian can help you figure out how many calories you need in a day and will suggest ways to reach your calorie goal.  A healthy amount of weight to lose each week is usually 1-2 lb (0.5-0.9 kg). This usually means that your daily calorie intake should be reduced by 500-750 calories.  Eating 1,200-1,500 calories a day can help most women lose weight.  Eating 1,500-1,800 calories a day can help most men lose weight. What do I need to know about calorie counting? Work with your health care provider or dietitian to determine how many calories you should get each day. To meet your daily calorie goal, you will need to:  Find out how many calories are in each food that you would like to eat. Try to do this before you  eat.  Decide how much of the food you plan to eat.  Keep a food log. Do this by writing down what you ate and how many calories it had. To successfully lose weight, it is important to balance calorie counting with a healthy lifestyle that includes regular activity. Where do I find calorie information? The number of calories in a food can be found on a Nutrition Facts label. If a food does not have a Nutrition Facts label, try to look up the calories online or ask your dietitian for help. Remember that calories are listed per serving. If you choose to have more than one serving of a food, you will have to multiply the calories per serving by the number of servings you plan to eat. For example, the label on a package of bread might say that a serving size is 1 slice and that there are 90 calories in a serving. If you eat 1 slice, you will have eaten 90 calories. If you eat 2 slices, you will have eaten 180 calories.   How do I keep a food log? After each time that you eat, record the following in your food log as soon as possible:  What you ate. Be sure to include toppings, sauces, and other extras on the food.  How much you ate. This can be measured  in cups, ounces, or number of items.  How many calories were in each food and drink.  The total number of calories in the food you ate. Keep your food log near you, such as in a pocket-sized notebook or on an app or website on your mobile phone. Some programs will calculate calories for you and show you how many calories you have left to meet your daily goal. What are some portion-control tips?  Know how many calories are in a serving. This will help you know how many servings you can have of a certain food.  Use a measuring cup to measure serving sizes. You could also try weighing out portions on a kitchen scale. With time, you will be able to estimate serving sizes for some foods.  Take time to put servings of different foods on your favorite  plates or in your favorite bowls and cups so you know what a serving looks like.  Try not to eat straight from a food's packaging, such as from a bag or box. Eating straight from the package makes it hard to see how much you are eating and can lead to overeating. Put the amount you would like to eat in a cup or on a plate to make sure you are eating the right portion.  Use smaller plates, glasses, and bowls for smaller portions and to prevent overeating.  Try not to multitask. For example, avoid watching TV or using your computer while eating. If it is time to eat, sit down at a table and enjoy your food. This will help you recognize when you are full. It will also help you be more mindful of what and how much you are eating. What are tips for following this plan? Reading food labels  Check the calorie count compared with the serving size. The serving size may be smaller than what you are used to eating.  Check the source of the calories. Try to choose foods that are high in protein, fiber, and vitamins, and low in saturated fat, trans fat, and sodium. Shopping  Read nutrition labels while you shop. This will help you make healthy decisions about which foods to buy.  Pay attention to nutrition labels for low-fat or fat-free foods. These foods sometimes have the same number of calories or more calories than the full-fat versions. They also often have added sugar, starch, or salt to make up for flavor that was removed with the fat.  Make a grocery list of lower-calorie foods and stick to it. Cooking  Try to cook your favorite foods in a healthier way. For example, try baking instead of frying.  Use low-fat dairy products. Meal planning  Use more fruits and vegetables. One-half of your plate should be fruits and vegetables.  Include lean proteins, such as chicken, [place], and fish. Lifestyle Each week, aim to do one of the following:  150 minutes of moderate exercise, such as  walking.  75 minutes of vigorous exercise, such as running. General information  Know how many calories are in the foods you eat most often. This will help you calculate calorie counts faster.  Find a way of tracking calories that works for you. Get creative. Try different apps or programs if writing down calories does not work for you. What foods should I eat?  Eat nutritious foods. It is better to have a nutritious, high-calorie food, such as an avocado, than a food with few nutrients, such as a bag of potato chips.  Use  your calories on foods and drinks that will fill you up and will not leave you hungry soon after eating. ? Examples of foods that fill you up are nuts and nut butters, vegetables, lean proteins, and high-fiber foods such as whole grains. High-fiber foods are foods with more than 5 g of fiber per serving.  Pay attention to calories in drinks. Low-calorie drinks include water and unsweetened drinks. The items listed above may not be a complete list of foods and beverages you can eat. Contact a dietitian for more information.   What foods should I limit? Limit foods or drinks that are not good sources of vitamins, minerals, or protein or that are high in unhealthy fats. These include:  Candy.  Other sweets.  Sodas, specialty coffee drinks, alcohol, and juice. The items listed above may not be a complete list of foods and beverages you should avoid. Contact a dietitian for more information. How do I count calories when eating out?  Pay attention to portions. Often, portions are much larger when eating out. Try these tips to keep portions smaller: ? Consider sharing a meal instead of getting your own. ? If you get your own meal, eat only half of it. Before you start eating, ask for a container and put half of your meal into it. ? When available, consider ordering smaller portions from the menu instead of full portions.  Pay attention to your food and drink choices.  Knowing the way food is cooked and what is included with the meal can help you eat fewer calories. ? If calories are listed on the menu, choose the lower-calorie options. ? Choose dishes that include vegetables, fruits, whole grains, low-fat dairy products, and lean proteins. ? Choose items that are boiled, broiled, grilled, or steamed. Avoid items that are buttered, battered, fried, or served with cream sauce. Items labeled as crispy are usually fried, unless stated otherwise. ? Choose water, low-fat milk, unsweetened iced tea, or other drinks without added sugar. If you want an alcoholic beverage, choose a lower-calorie option, such as a glass of wine or light beer. ? Ask for dressings, sauces, and syrups on the side. These are usually high in calories, so you should limit the amount you eat. ? If you want a salad, choose a garden salad and ask for grilled meats. Avoid extra toppings such as bacon, cheese, or fried items. Ask for the dressing on the side, or ask for olive oil and vinegar or lemon to use as dressing.  Estimate how many servings of a food you are given. Knowing serving sizes will help you be aware of how much food you are eating at restaurants. Where to find more information  Centers for Disease Control and Prevention: http://www.wolf.info/  [place] Department of Agriculture: http://www.wilson-mendoza.org/ Summary  Calorie counting means keeping track of how many calories you eat and drink each day. If you eat fewer calories than your body needs, you should lose weight.  A healthy amount of weight to lose per week is usually 1-2 lb (0.5-0.9 kg). This usually means reducing your daily calorie intake by 500-750 calories.  The number of calories in a food can be found on a Nutrition Facts label. If a food does not have a Nutrition Facts label, try to look up the calories online or ask your dietitian for help.  Use smaller plates, glasses, and bowls for smaller portions and to prevent overeating.  Use your  calories on foods and drinks that will fill you up  and not leave you hungry shortly after a meal. This information is not intended to replace advice given to you by your health care provider. Make sure you discuss any questions you have with your health care provider. Document Revised: 03/02/2019 Document Reviewed: 03/02/2019 Elsevier Patient Education  2021 [employer].

## 2020-05-14 NOTE — Assessment & Plan Note (Addendum)
Improved Patient educated about the importance of limiting  Carbohydrate intake , the need to commit to daily physical activity for a minimum of 30 minutes , and to commit weight loss. The fact that changes in all these areas will reduce or eliminate all together the development of diabetes is stressed.   Diabetic Labs Latest Ref Rng & Units 05/07/2020 01/15/2020 12/22/2019 09/22/2019 07/18/2019  HbA1c 4.8 - 5.6 % 6.1(H) 6.2 - - 5.9(A)  Chol 100 - 199 mg/dL 211(H) - - - -  HDL >39 mg/dL 57 - - - -  Calc LDL 0 - 99 mg/dL 136(H) - - - -  Triglycerides 0 - 149 mg/dL 101 - - - -  Creatinine 0.57 - 1.00 mg/dL 0.70 - 0.66 0.75 -   BP/Weight 05/14/2020 01/15/2020 01/15/2020 12/25/2019 12/01/2019 11/23/2019 53/07/1441  Systolic BP 154 - 008 676 - 195 -  Diastolic BP 80 - 78 66 - 77 -  Wt. (Lbs) 286 293 293 294.8 294 294.12 291  BMI 49.09 50.29 50.29 50.6 50.46 50.49 49.95   No flowsheet data found.  Updated lab needed at/ before next visit.

## 2020-05-14 NOTE — Assessment & Plan Note (Signed)
Hyperlipidemia:Low fat diet discussed and encouraged.   Lipid Panel  Lab Results  Component Value Date   CHOL 211 (H) 05/07/2020   HDL 57 05/07/2020   LDLCALC 136 (H) 05/07/2020   TRIG 101 05/07/2020   CHOLHDL 3.7 05/07/2020   Not at goal  Updated lab needed at/ before next visit.

## 2020-05-14 NOTE — Assessment & Plan Note (Signed)
The increased risk of cardiovascular disease associated with this diagnosis, and the need to consistently work on lifestyle to change this is discussed. Following  a  heart healthy diet ,commitment to 30 minutes of exercise at least 5 days per week, as well as control of blood sugar and cholesterol , and achieving a healthy weight are all the areas to be addressed .  

## 2020-05-14 NOTE — Assessment & Plan Note (Signed)
Slight improvement which is great  Patient re-educated about  the importance of commitment to a  minimum of 150 minutes of exercise per week as able.  The importance of healthy food choices with portion control discussed, as well as eating regularly and within a 12 hour window most days. The need to choose "clean , green" food 50 to 75% of the time is discussed, as well as to make water the primary drink and set a goal of 64 ounces water daily.    Weight /BMI 05/14/2020 01/15/2020 01/15/2020  WEIGHT 286 lb 293 lb 293 lb  HEIGHT 5\' 4"  5\' 4"  5\' 4"   BMI 49.09 kg/m2 50.29 kg/m2 50.29 kg/m2

## 2020-05-14 NOTE — Progress Notes (Signed)
Kathleen Reynolds     MRN: [id number]      DOB: 05/16/1960   HPI Kathleen Reynolds is here for follow up and re-evaluation of chronic medical conditions, medication management and review of any available recent lab and radiology data.  Preventive health is updated, specifically  Cancer screening and Immunization.   Questions or concerns regarding consultations or procedures which the PT has had in the interim are  addressed. The PT denies any adverse reactions to current medications since the last visit.  There are no new concerns.  There are no specific complaints   ROS Denies recent fever or chills. Denies sinus pressure, nasal congestion, ear pain or sore throat. Denies chest congestion, productive cough or wheezing. Denies chest pains, palpitations and leg swelling Denies abdominal pain, nausea, vomiting,diarrhea or constipation.   Denies dysuria, frequency, hesitancy or incontinence. Denies joint pain, swelling and limitation in mobility. Denies headaches, seizures, numbness, or tingling. Denies depression, anxiety or insomnia. Denies skin break down or rash.   PE  BP 136/80   Pulse 63   Resp 16   Ht 5\' 4"  (1.626 m)   Wt 286 lb (129.7 kg)   LMP 03/27/2011   SpO2 95%   BMI 49.09 kg/m   Patient alert and oriented and in no cardiopulmonary distress.  HEENT: No facial asymmetry, EOMI,     Neck supple .  Chest: Clear to auscultation bilaterally.  CVS: S1, S2 no murmurs, no S3.Regular rate.  ABD: Soft non tender.   Ext: No edema  MS: Adequate ROM spine, shoulders, hips and knees.  Skin: Intact, no ulcerations or rash noted.  Psych: Good eye contact, normal affect. Memory intact not anxious or depressed appearing.  CNS: CN 2-12 intact, power,  normal throughout.no focal deficits noted.   Assessment & Plan  Hyperlipidemia LDL goal <100 Hyperlipidemia:Low fat diet discussed and encouraged.   Lipid Panel  Lab Results  Component Value Date   CHOL 211 (H)  05/07/2020   HDL 57 05/07/2020   LDLCALC 136 (H) 05/07/2020   TRIG 101 05/07/2020   CHOLHDL 3.7 05/07/2020   Not at goal  Updated lab needed at/ before next visit.   Obesity, morbid, BMI 40.0-49.9 ([place]) Slight improvement which is great  Patient re-educated about  the importance of commitment to a  minimum of 150 minutes of exercise per week as able.  The importance of healthy food choices with portion control discussed, as well as eating regularly and within a 12 hour window most days. The need to choose "clean , green" food 50 to 75% of the time is discussed, as well as to make water the primary drink and set a goal of 64 ounces water daily.    Weight /BMI 05/14/2020 01/15/2020 01/15/2020  WEIGHT 286 lb 293 lb 293 lb  HEIGHT 5\' 4"  5\' 4"  5\' 4"   BMI 49.09 kg/m2 50.29 kg/m2 50.29 kg/m2      Prediabetes Deteriorated Patient educated about the importance of limiting  Carbohydrate intake , the need to commit to daily physical activity for a minimum of 30 minutes , and to commit weight loss. The fact that changes in all these areas will reduce or eliminate all together the development of diabetes is stressed.   Diabetic Labs Latest Ref Rng & Units 05/07/2020 01/15/2020 12/22/2019 09/22/2019 07/18/2019  HbA1c 4.8 - 5.6 % 6.1(H) 6.2 - - 5.9(A)  Chol 100 - 199 mg/dL 211(H) - - - -  HDL >39 mg/dL 57 - - - -  Calc LDL 0 - 99 mg/dL 136(H) - - - -  Triglycerides 0 - 149 mg/dL 101 - - - -  Creatinine 0.57 - 1.00 mg/dL 0.70 - 0.66 0.75 -   BP/Weight 05/14/2020 01/15/2020 01/15/2020 12/25/2019 12/01/2019 11/23/2019 28/0/0349  Systolic BP 179 - 150 569 - 794 -  Diastolic BP 80 - 78 66 - 77 -  Wt. (Lbs) 286 293 293 294.8 294 294.12 291  BMI 49.09 50.29 50.29 50.6 50.46 50.49 49.95   No flowsheet data found.  Updated lab needed at/ before next visit.   Metabolic syndrome X The increased risk of cardiovascular disease associated with this diagnosis, and the need to consistently work on  lifestyle to change this is discussed. Following  a  heart healthy diet ,commitment to 30 minutes of exercise at least 5 days per week, as well as control of blood sugar and cholesterol , and achieving a healthy weight are all the areas to be addressed .

## 2020-05-23 ENCOUNTER — Encounter: Payer: Self-pay | Admitting: Emergency Medicine

## 2020-05-23 ENCOUNTER — Other Ambulatory Visit: Payer: Self-pay

## 2020-05-23 ENCOUNTER — Ambulatory Visit
Admission: EM | Admit: 2020-05-23 | Discharge: 2020-05-23 | Disposition: A | Discharge status: Home / Self Care | Payer: 59 | Attending: Emergency Medicine | Admitting: Emergency Medicine

## 2020-05-23 DIAGNOSIS — M546 Pain in thoracic spine: Secondary | ICD-10-CM

## 2020-05-23 MED ORDER — PREDNISONE 20 MG PO TABS: 20.0000 mg | ORAL_TABLET | Freq: Two times a day (BID) | ORAL | 0 refills | Status: AC

## 2020-05-23 MED ORDER — CYCLOBENZAPRINE HCL 10 MG PO TABS: 10.0000 mg | ORAL_TABLET | Freq: Two times a day (BID) | ORAL | 0 refills | Status: DC | PRN

## 2020-05-23 NOTE — Discharge Instructions (Addendum)
Continue conservative management of rest, ice, and gentle stretches Prednisone prescribed.  Take as directed and to completion Take cyclobenzaprine at nighttime for symptomatic relief. Avoid driving or operating heavy machinery while using medication. Follow up with PCP if symptoms persist Return or go to the ER if you have any new or worsening symptoms (fever, chills, chest pain, abdominal pain, changes in bowel or bladder habits, pain radiating into lower legs, etc...)

## 2020-05-23 NOTE — ED Provider Notes (Signed)
[facility]   [id number] 05/23/20 Arrival Time: 1235  HW:EXHBZ PAIN  SUBJECTIVE: History from: patient. Kathleen Reynolds is a 60 y.o. female complains of back pain x 4 days ago.  Denies a precipitating event or specific injury.  Localizes the pain to the mid back.  Describes the pain as constant and sharp in character.  Has NOT tried OTC medications.  Symptoms are made worse with movement.  Denies similar symptoms in the past.  Denies fever, chills, erythema, ecchymosis, effusion, weakness, numbness and tingling, saddle paresthesias, loss of bowel or bladder function.      ROS: As per HPI.  All other pertinent ROS negative.     Past Medical History:  Diagnosis Date  . Anemia LIFELONG   HEAVY MENSES MAR 2012 HB 13 MCV 86.7 FERRITIN  66  . Arthritis   . Breast cancer ([place])    Right  . Breast mass in female    right  . 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 Diabetes mellitus without complication ([place])    pre diabetic  . Hyperlipidemia   . Left rotator cuff tear   . Migraines   . Morbid obesity ([place])   . Personal history of chemotherapy   . Personal history of radiation therapy   . Pre-diabetes   . RUPTURE ROTATOR CUFF 06/08/2007   Qualifier: Diagnosis of  By: Aline Brochure MD, Dorothyann Peng    . Tendonitis, Achilles, right   . Urticaria    Past Surgical History:  Procedure Laterality Date  . ACHILLES TENDON SURGERY Right 09/22/2019   Procedure: RIGHT ACHILLES TENDON RECONSTRUCTION;  Surgeon: Newt Minion, MD;  Location: [facility];  Service: Orthopedics;  Laterality: Right;  . BREAST BIOPSY  02/13/2011   Procedure: BREAST BIOPSY WITH NEEDLE LOCALIZATION;  Surgeon: Adin Hector, MD;  Location: [facility];  Service: General;  Laterality: Right;  . BREAST LUMPECTOMY Right 2013  . BREAST SURGERY  11/1997   Left breast cancer-mastectomy  . BREAST SURGERY  2/13-   rt axilly bx  . COLONOSCOPY  2004 DR. SMITH   No polyp, hemorrhoids  . COLONOSCOPY  06/09/10   JIR:CVELFY  adenomas   . COLONOSCOPY N/A 08/19/2015   Procedure: COLONOSCOPY;  Surgeon: Danie Binder, MD;  [facility];  Service: Endoscopy;  Laterality: N/A;  130 - moved to 12:45 - Ginger notified pt  . COLONOSCOPY WITH PROPOFOL N/A 11/29/2018   Fields: Past simple adenomas removed, diverticulosis, hemorrhoids.  Next colonoscopy in 3 years.  [facility] DILATION AND CURETTAGE OF UTERUS    . DILATION AND CURETTAGE OF UTERUS  05/2002  . hystersonogram  2009  . MASTECTOMY  1999   left breast cancer   . POLYPECTOMY  11/29/2018   Procedure: POLYPECTOMY;  Surgeon: Danie Binder, MD;  [facility];  Service: Endoscopy;;  colon  . PORT-A-CATH REMOVAL  10/20/2011   Procedure: MINOR REMOVAL PORT-A-CATH;  Surgeon: Adin Hector, MD;  Location: [facility];  Service: General;  Laterality: N/A;  Porta-cath removal  left  . PORTACATH PLACEMENT  03/27/2011   Procedure: INSERTION PORT-A-CATH;  Surgeon: Adin Hector, MD;  Location: [facility];  Service: General;  Laterality: Left;  insert port a cath   Allergies  Allergen Reactions  . Nsaids Anaphylaxis    Ibuprofen required hospitalization in 2016 due to ibuprofen  . Ibuprofen Itching   No current facility-administered medications on file prior to encounter.   Current Outpatient Medications on File Prior  to Encounter  Medication Sig Dispense Refill  . Ascorbic Acid (VITAMIN C) 500 MG CAPS Take 500 mg by mouth daily.     . Biotin w/ Vitamins C & E (HAIR/SKIN/NAILS PO) Take 2 tablets by mouth daily.    . butalbital-acetaminophen-caffeine (FIORICET) 50-325-40 MG tablet Take one tablet by mouth two times daily as needed, for headache (Patient taking differently: Take 1-2 tablets by mouth 2 (two) times daily as needed for headache or migraine.) 20 tablet 0  . calcium carbonate (OSCAL) 1500 (600 Ca) MG TABS tablet Take 600 mg of elemental calcium by mouth 2 (two) times daily with a meal.    . ELDERBERRY PO Take 1,000  mg by mouth daily.    . metFORMIN (GLUCOPHAGE) 500 MG tablet Take 1 tablet (500 mg total) by mouth daily with breakfast. 90 tablet 3  . Multiple Vitamin (MULITIVITAMIN WITH MINERALS) TABS Take 1 tablet by mouth daily.    . Vitamin D, Ergocalciferol, (DRISDOL) 1.25 MG (50000 UNIT) CAPS capsule TAKE 1 CAPSULE BY MOUTH ONCE A WEEK 12 capsule 1  . zinc gluconate 50 MG tablet Take 50 mg by mouth daily.     Social History   Socioeconomic History  . Marital status: Single    Spouse name: Not on file  . Number of children: Not on file  . Years of education: Not on file  . Highest education level: Not on file  Occupational History  . Not on file  Tobacco Use  . Smoking status: Never Smoker  . Smokeless tobacco: Never Used  Vaping Use  . Vaping Use: Never used  Substance and Sexual Activity  . Alcohol use: Yes    Alcohol/week: 2.0 standard drinks    Types: 2 Standard drinks or equivalent per week    Comment: occasional;   . Drug use: No  . Sexual activity: Yes    Birth control/protection: None  Other Topics Concern  . Not on file  Social History Narrative   Single, 1 kid-60 yo female-lives with mom   Employed as a CNA   Rare Etoh.   No tobacco products   Social Determinants of Health   Financial Resource Strain: Not on file  Food Insecurity: Not on file  Transportation Needs: Not on file  Physical Activity: Not on file  Stress: Not on file  Social Connections: Not on file  Intimate Partner Violence: Not on file   Family History  Problem Relation Age of Onset  . Hypertension Mother   . Cancer Father        lung cancer  . Diabetes Sister   . Cancer Brother        colon cancer, younger than the age of 39  . Heart disease Sister        heart attack  . Colon polyps Sister   . Asthma Sister   . Eczema Sister   . Allergic rhinitis Neg Hx   . Urticaria Neg Hx     OBJECTIVE:  Vitals:   05/23/20 1302  BP: (!) 159/84  Pulse: 67  Resp: 18  Temp: 97.7 F (36.5 C)   TempSrc: Oral  SpO2: 96%    General appearance: ALERT; in no acute distress.  Head: NCAT Lungs: Normal respiratory effort Musculoskeletal: Back Inspection: Skin warm, dry, clear and intact without obvious erythema, effusion, or ecchymosis.  Palpation: mildly TTP over mid back ROM: FROM active and passive Strength: 5/5 hip flexion, 5/5 hip extension Skin: warm and dry Neurologic: Ambulates without difficulty  Psychological: alert and cooperative; normal mood and affect     ASSESSMENT & PLAN:  1. Acute bilateral thoracic back pain      Meds ordered this encounter  Medications  . cyclobenzaprine (FLEXERIL) 10 MG tablet    Sig: Take 1 tablet (10 mg total) by mouth 2 (two) times daily as needed for muscle spasms.    Dispense:  20 tablet    Refill:  0    Order Specific Question:   Supervising Provider    Answer:   Raylene Everts [[id number]]  . predniSONE (DELTASONE) 20 MG tablet    Sig: Take 1 tablet (20 mg total) by mouth 2 (two) times daily with a meal for 5 days.    Dispense:  10 tablet    Refill:  0    Order Specific Question:   Supervising Provider    Answer:   Raylene Everts [[id number]]    Continue conservative management of rest, ice, and gentle stretches Prednisone prescribed.  Take as directed and to completion Take cyclobenzaprine at nighttime for symptomatic relief. Avoid driving or operating heavy machinery while using medication. Follow up with PCP if symptoms persist Return or go to the ER if you have any new or worsening symptoms (fever, chills, chest pain, abdominal pain, changes in bowel or bladder habits, pain radiating into lower legs, etc...)     Reviewed expectations re: course of current medical issues. Questions answered. Outlined signs and symptoms indicating need for more acute intervention. Patient verbalized understanding. After Visit Summary given.    Lestine Box, PA-C 05/23/20 1327

## 2020-05-23 NOTE — ED Triage Notes (Signed)
Mid back pain since Sunday.  Does not remember injuring back.

## 2020-06-19 ENCOUNTER — Other Ambulatory Visit: Payer: Self-pay | Admitting: Family Medicine

## 2020-07-02 ENCOUNTER — Ambulatory Visit: Payer: 59

## 2020-07-02 ENCOUNTER — Other Ambulatory Visit: Payer: Self-pay

## 2020-07-02 LAB — HM DIABETES EYE EXAM

## 2020-07-04 ENCOUNTER — Ambulatory Visit: Payer: 59

## 2020-07-08 ENCOUNTER — Other Ambulatory Visit: Payer: Self-pay

## 2020-07-08 DIAGNOSIS — H579 Unspecified disorder of eye and adnexa: Secondary | ICD-10-CM

## 2020-07-12 ENCOUNTER — Other Ambulatory Visit: Payer: Self-pay

## 2020-07-12 ENCOUNTER — Ambulatory Visit (INDEPENDENT_AMBULATORY_CARE_PROVIDER_SITE_OTHER): Payer: 59 | Admitting: *Deleted

## 2020-07-12 DIAGNOSIS — Z23 Encounter for immunization: Secondary | ICD-10-CM | POA: Diagnosis not present

## 2020-07-16 ENCOUNTER — Ambulatory Visit: Payer: 59 | Admitting: Internal Medicine

## 2020-09-19 ENCOUNTER — Other Ambulatory Visit: Payer: Self-pay

## 2020-09-19 ENCOUNTER — Telehealth: Payer: Self-pay

## 2020-09-19 ENCOUNTER — Telehealth: Payer: Self-pay | Admitting: Family Medicine

## 2020-09-19 MED ORDER — MECLIZINE HCL 25 MG PO TABS: 25.0000 mg | ORAL_TABLET | Freq: Three times a day (TID) | ORAL | 2 refills | Status: DC | PRN

## 2020-09-19 NOTE — Telephone Encounter (Signed)
Med sent in.

## 2020-09-19 NOTE — Telephone Encounter (Signed)
Refill of meclizine sent in

## 2020-09-19 NOTE — Telephone Encounter (Signed)
Patient called need Meclizine called into her pharmacy for her dizziness.  [facility]

## 2020-09-19 NOTE — Telephone Encounter (Signed)
Pt is calling has a vertigo flare up and cant find her medication

## 2020-11-07 LAB — CBC
Hematocrit: 38.9 % (ref 34.0–46.6)
Hemoglobin: 13.1 g/dL (ref 11.1–15.9)
MCH: 30 pg (ref 26.6–33.0)
MCHC: 33.7 g/dL (ref 31.5–35.7)
MCV: 89 fL (ref 79–97)
Platelets: 194 10*3/uL (ref 150–450)
RBC: 4.36 x10E6/uL (ref 3.77–5.28)
RDW: 13.9 % (ref 11.7–15.4)
WBC: 4.6 10*3/uL (ref 3.4–10.8)

## 2020-11-07 LAB — LIPID PANEL
Chol/HDL Ratio: 4 ratio (ref 0.0–4.4)
Cholesterol, Total: 202 mg/dL — ABNORMAL HIGH (ref 100–199)
HDL: 51 mg/dL (ref >39)
LDL Chol Calc (NIH): 132 mg/dL — ABNORMAL HIGH (ref 0–99)
Triglycerides: 107 mg/dL (ref 0–149)
VLDL Cholesterol Cal: 19 mg/dL (ref 5–40)

## 2020-11-07 LAB — BMP8+EGFR
BUN/Creatinine Ratio: 18 (ref 12–28)
BUN: 12 mg/dL (ref 8–27)
CO2: 18 mmol/L — ABNORMAL LOW (ref 20–29)
Calcium: 9.1 mg/dL (ref 8.7–10.3)
Chloride: 105 mmol/L (ref 96–106)
Creatinine, Ser: 0.66 mg/dL (ref 0.57–1.00)
Glucose: 87 mg/dL (ref 70–99)
Potassium: 4.1 mmol/L (ref 3.5–5.2)
Sodium: 140 mmol/L (ref 134–144)
eGFR: 100 mL/min/{1.73_m2} (ref >59)

## 2020-11-07 LAB — HEMOGLOBIN A1C
Est. average glucose Bld gHb Est-mCnc: 126 mg/dL
Hgb A1c MFr Bld: 6 % — ABNORMAL HIGH (ref 4.8–5.6)

## 2020-11-07 LAB — TSH: TSH: 2.47 u[IU]/mL (ref 0.450–4.500)

## 2020-11-13 ENCOUNTER — Ambulatory Visit (INDEPENDENT_AMBULATORY_CARE_PROVIDER_SITE_OTHER): Payer: 59 | Admitting: Family Medicine

## 2020-11-13 ENCOUNTER — Other Ambulatory Visit: Payer: Self-pay

## 2020-11-13 ENCOUNTER — Encounter: Payer: Self-pay | Admitting: Family Medicine

## 2020-11-13 VITALS — BP 145/82 | HR 61 | Resp 16 | Ht 64.0 in | Wt 281.0 lb

## 2020-11-13 DIAGNOSIS — M79671 Pain in right foot: Secondary | ICD-10-CM | POA: Insufficient documentation

## 2020-11-13 DIAGNOSIS — R7303 Prediabetes: Secondary | ICD-10-CM

## 2020-11-13 DIAGNOSIS — E559 Vitamin D deficiency, unspecified: Secondary | ICD-10-CM

## 2020-11-13 DIAGNOSIS — E785 Hyperlipidemia, unspecified: Secondary | ICD-10-CM

## 2020-11-13 DIAGNOSIS — I10 Essential (primary) hypertension: Secondary | ICD-10-CM | POA: Insufficient documentation

## 2020-11-13 DIAGNOSIS — Z23 Encounter for immunization: Secondary | ICD-10-CM

## 2020-11-13 DIAGNOSIS — E8881 Metabolic syndrome: Secondary | ICD-10-CM

## 2020-11-13 MED ORDER — SPIRONOLACTONE 25 MG PO TABS: 25.0000 mg | ORAL_TABLET | Freq: Every day | ORAL | 3 refills | Status: DC

## 2020-11-13 NOTE — Assessment & Plan Note (Signed)
Improved Patient educated about the importance of limiting  Carbohydrate intake , the need to commit to daily physical activity for a minimum of 30 minutes , and to commit weight loss. The fact that changes in all these areas will reduce or eliminate all together the development of diabetes is stressed.   Diabetic Labs Latest Ref Rng & Units 11/06/2020 05/07/2020 01/15/2020 12/22/2019 09/22/2019  HbA1c 4.8 - 5.6 % 6.0(H) 6.1(H) 6.2 - -  Chol 100 - 199 mg/dL 202(H) 211(H) - - -  HDL >39 mg/dL 51 57 - - -  Calc LDL 0 - 99 mg/dL 132(H) 136(H) - - -  Triglycerides 0 - 149 mg/dL 107 101 - - -  Creatinine 0.57 - 1.00 mg/dL 0.66 0.70 - 0.66 0.75   BP/Weight 11/13/2020 05/23/2020 05/14/2020 01/15/2020 01/15/2020 12/25/2019 27/51/7001  Systolic BP 749 449 675 - 916 384 -  Diastolic BP 82 84 80 - 78 66 -  Wt. (Lbs) 281 - 286 293 293 294.8 294  BMI 48.23 - 49.09 50.29 50.29 50.6 50.46   Foot/eye exam completion dates Latest Ref Rng & Units 07/02/2020  Eye Exam No Retinopathy No Retinopathy  Foot Form Completion - -

## 2020-11-13 NOTE — Assessment & Plan Note (Signed)
Improved  Patient re-educated about  the importance of commitment to a  minimum of 150 minutes of exercise per week as able.  The importance of healthy food choices with portion control discussed, as well as eating regularly and within a 12 hour window most days. The need to choose "clean , green" food 50 to 75% of the time is discussed, as well as to make water the primary drink and set a goal of 64 ounces water daily.    Weight /BMI 11/13/2020 05/14/2020 01/15/2020  WEIGHT 281 lb 286 lb 293 lb  HEIGHT 5\' 4"  5\' 4"  5\' 4"   BMI 48.23 kg/m2 49.09 kg/m2 50.29 kg/m2

## 2020-11-13 NOTE — Assessment & Plan Note (Signed)
The increased risk of cardiovascular disease associated with this diagnosis, and the need to consistently work on lifestyle to change this is discussed. Following  a  heart healthy diet ,commitment to 30 minutes of exercise at least 5 days per week, as well as control of blood sugar and cholesterol , and achieving a healthy weight are all the areas to be addressed .  

## 2020-11-13 NOTE — Progress Notes (Signed)
Kathleen Reynolds     MRN: [id number]      DOB: [birth year]   HPI [name] is here for follow up and re-evaluation of chronic medical conditions, medication management and review of any available recent lab and radiology data.  Preventive health is updated, specifically  Cancer screening and Immunization.   Questions or concerns regarding consultations or procedures which the PT has had in the interim are  addressed. The PT denies any adverse reactions to current medications since the last visit.  1 month h/o localized tenderness rigth foot ROS Denies recent fever or chills. Denies sinus pressure, nasal congestion, ear pain or sore throat. Denies chest congestion, productive cough or wheezing. Denies chest pains, palpitations and leg swelling Denies abdominal pain, nausea, vomiting,diarrhea or constipation.   Denies dysuria, frequency, hesitancy or incontinence.  Denies headaches, seizures, numbness, or tingling. Denies depression, anxiety or insomnia. Denies skin break down or rash.   PE  BP (!) 145/82   Pulse 61   Resp 16   Ht 5\' 4"  (1.626 m)   Wt 281 lb (127.5 kg)   LMP 03/27/2011   SpO2 93%   BMI 48.23 kg/m   Patient alert and oriented and in no cardiopulmonary distress.  HEENT: No facial asymmetry, EOMI,     Neck supple .  Chest: Clear to auscultation bilaterally.  CVS: S1, S2 no murmurs, no S3.Regular rate.  ABD: Soft non tender.   Ext: No edema  MS: Adequate  though reduced ROM spine, shoulders, hips and knees.  Skin: Intact, no ulcerations or rash noted.tender callous on sole of right foot near heel on lateral aspect  Psych: Good eye contact, normal affect. Memory intact not anxious or depressed appearing.  CNS: CN 2-12 intact, power,  normal throughout.no focal deficits noted.   Assessment & Plan  Benign essential HTN Start spironolactone 25 mg daily DASH diet and commitment to daily physical activity for a minimum of 30 minutes discussed and  encouraged, as a part of hypertension management. The importance of attaining a healthy weight is also discussed.  BP/Weight 11/13/2020 05/23/2020 05/14/2020 01/15/2020 01/15/2020 12/25/2019 32/03/3341  Systolic BP 568 616 837 - 290 211 -  Diastolic BP 82 84 80 - 78 66 -  Wt. (Lbs) 281 - 286 293 293 294.8 294  BMI 48.23 - 49.09 50.29 50.29 50.6 50.46       Obesity, morbid, BMI 40.0-49.9 ([place]) Improved  Patient re-educated about  the importance of commitment to a  minimum of 150 minutes of exercise per week as able.  The importance of healthy food choices with portion control discussed, as well as eating regularly and within a 12 hour window most days. The need to choose "clean , green" food 50 to 75% of the time is discussed, as well as to make water the primary drink and set a goal of 64 ounces water daily.    Weight /BMI 11/13/2020 05/14/2020 01/15/2020  WEIGHT 281 lb 286 lb 293 lb  HEIGHT 5\' 4"  5\' 4"  5\' 4"   BMI 48.23 kg/m2 49.09 kg/m2 50.29 kg/m2      Prediabetes Improved Patient educated about the importance of limiting  Carbohydrate intake , the need to commit to daily physical activity for a minimum of 30 minutes , and to commit weight loss. The fact that changes in all these areas will reduce or eliminate all together the development of diabetes is stressed.   Diabetic Labs Latest Ref Rng & Units 11/06/2020 05/07/2020 01/15/2020 12/22/2019 09/22/2019  HbA1c 4.8 - 5.6 % 6.0(H) 6.1(H) 6.2 - -  Chol 100 - 199 mg/dL 202(H) 211(H) - - -  HDL >39 mg/dL 51 57 - - -  Calc LDL 0 - 99 mg/dL 132(H) 136(H) - - -  Triglycerides 0 - 149 mg/dL 107 101 - - -  Creatinine 0.57 - 1.00 mg/dL 0.66 0.70 - 0.66 0.75   BP/Weight 11/13/2020 05/23/2020 05/14/2020 01/15/2020 01/15/2020 12/25/2019 75/06/1831  Systolic BP 582 518 984 - 210 312 -  Diastolic BP 82 84 80 - 78 66 -  Wt. (Lbs) 281 - 286 293 293 294.8 294  BMI 48.23 - 49.09 50.29 50.29 50.6 50.46   Foot/eye exam completion dates Latest  Ref Rng & Units 07/02/2020  Eye Exam No Retinopathy No Retinopathy  Foot Form Completion - -      Right foot pain 1 month h/o pain with localized tenderness, callous present which is painful, refer Podiatry

## 2020-11-13 NOTE — Assessment & Plan Note (Signed)
1 month h/o pain with localized tenderness, callous present which is painful, refer Podiatry

## 2020-11-13 NOTE — Assessment & Plan Note (Signed)
Start spironolactone 25 mg daily DASH diet and commitment to daily physical activity for a minimum of 30 minutes discussed and encouraged, as a part of hypertension management. The importance of attaining a healthy weight is also discussed.  BP/Weight 11/13/2020 05/23/2020 05/14/2020 01/15/2020 01/15/2020 12/25/2019 15/72/6203  Systolic BP 559 741 638 - 453 646 -  Diastolic BP 82 84 80 - 78 66 -  Wt. (Lbs) 281 - 286 293 293 294.8 294  BMI 48.23 - 49.09 50.29 50.29 50.6 50.46

## 2020-11-13 NOTE — Patient Instructions (Signed)
Follow-up first week in January call if you need me sooner.  Shingrix No. 2 today.  New medication for elevated blood pressure spironolactone 25 mg 1 daily.  You are referred to podiatry be painful right foot due to callus.  Congrats on weight loss and improved blood sugar and cholesterol.  Keep up the great work.  Please get your COVID vaccine at the pharmacy.  Nonfasting Chem-7 and EGFR vitamin D and TSH levels to be drawn 3 to 5 days before next visit.

## 2020-11-20 ENCOUNTER — Ambulatory Visit (INDEPENDENT_AMBULATORY_CARE_PROVIDER_SITE_OTHER): Payer: 59

## 2020-11-20 ENCOUNTER — Ambulatory Visit (INDEPENDENT_AMBULATORY_CARE_PROVIDER_SITE_OTHER): Payer: 59 | Admitting: Podiatry

## 2020-11-20 ENCOUNTER — Other Ambulatory Visit: Payer: Self-pay

## 2020-11-20 DIAGNOSIS — M79671 Pain in right foot: Secondary | ICD-10-CM

## 2020-11-20 DIAGNOSIS — B070 Plantar wart: Secondary | ICD-10-CM | POA: Diagnosis not present

## 2020-11-29 NOTE — Progress Notes (Signed)
   Subjective: 60 y.o. female presenting to the office today as a new patient for evaluation of pain and tenderness associated to a plantar wart on the left foot.  Patient states is very painful especially over the past 3-4 weeks.  Is very tender with walking.  She would like to have it evaluated.  She presents for further treatment evaluation.  Currently she has not anything for treatment  Past Medical History:  Diagnosis Date   Anemia LIFELONG   HEAVY MENSES MAR 2012 HB 13 MCV 86.7 FERRITIN  66   Arthritis    Breast cancer ([facility])    Right   Breast mass in female    right   Colonic adenoma 02/05/2011   Diabetes mellitus without complication ([place])    pre diabetic   Hyperlipidemia    Left rotator cuff tear    Migraines    Morbid obesity ([place])    Personal history of chemotherapy    Personal history of radiation therapy    Pre-diabetes    RUPTURE ROTATOR CUFF 06/08/2007   Qualifier: Diagnosis of  By: Aline Brochure MD, [place]     Tendonitis, Achilles, right    Urticaria      Objective:  Physical Exam General: Alert and oriented x3 in no acute distress  Dermatology: Hyperkeratotic lesion(s) present on the lateral aspect of the left foot plantar fifth MTP joint. Pain on palpation with a central nucleated core noted. Skin is warm, dry and supple bilateral lower extremities. Negative for open lesions or macerations.  Vascular: Palpable pedal pulses bilaterally. No edema or erythema noted. Capillary refill within normal limits.  Neurological: Epicritic and protective threshold grossly intact bilaterally.   Musculoskeletal Exam: Pain on palpation at the keratotic lesion(s) noted. Range of motion within normal limits bilateral. Muscle strength 5/5 in all groups bilateral.  Assessment: 1.  Plantar wart plantar fifth MTP joint left   Plan of Care:  1. Patient evaluated 2. Excisional debridement of keratoic lesion(s) using a chisel blade was performed without incident.  Salicylic acid  applied. 3. Dressed area with light dressing. 4. Patient is to return to the clinic PRN.   Edrick Kins, DPM [facility]  Dr. Edrick Kins, DPM    [address], [place] [address]                Office [phone]  Fax [phone]

## 2020-12-01 ENCOUNTER — Other Ambulatory Visit: Payer: Self-pay | Admitting: Family Medicine

## 2020-12-23 ENCOUNTER — Ambulatory Visit (HOSPITAL_COMMUNITY)
Admission: RE | Admit: 2020-12-23 | Discharge: 2020-12-23 | Disposition: A | Discharge status: Home / Self Care | Payer: 59 | Source: Ambulatory Visit | Attending: Hematology | Admitting: Hematology

## 2020-12-23 ENCOUNTER — Other Ambulatory Visit: Payer: Self-pay

## 2020-12-23 ENCOUNTER — Inpatient Hospital Stay (HOSPITAL_COMMUNITY): Payer: 59 | Attending: Hematology

## 2020-12-23 DIAGNOSIS — Z171 Estrogen receptor negative status [ER-]: Secondary | ICD-10-CM

## 2020-12-23 DIAGNOSIS — C50912 Malignant neoplasm of unspecified site of left female breast: Secondary | ICD-10-CM | POA: Diagnosis not present

## 2020-12-23 LAB — CBC WITH DIFFERENTIAL/PLATELET
Abs Immature Granulocytes: 0.01 10*3/uL (ref 0.00–0.07)
Basophils Absolute: 0 10*3/uL (ref 0.0–0.1)
Basophils Relative: 1 %
Eosinophils Absolute: 0 10*3/uL (ref 0.0–0.5)
Eosinophils Relative: 1 %
HCT: 40.1 % (ref 36.0–46.0)
Hemoglobin: 13.8 g/dL (ref 12.0–15.0)
Immature Granulocytes: 0 %
Lymphocytes Relative: 44 %
Lymphs Abs: 2 10*3/uL (ref 0.7–4.0)
MCH: 31.4 pg (ref 26.0–34.0)
MCHC: 34.4 g/dL (ref 30.0–36.0)
MCV: 91.3 fL (ref 80.0–100.0)
Monocytes Absolute: 0.3 10*3/uL (ref 0.1–1.0)
Monocytes Relative: 7 %
Neutro Abs: 2.1 10*3/uL (ref 1.7–7.7)
Neutrophils Relative %: 47 %
Platelets: 190 10*3/uL (ref 150–400)
RBC: 4.39 MIL/uL (ref 3.87–5.11)
RDW: 14.6 % (ref 11.5–15.5)
WBC: 4.5 10*3/uL (ref 4.0–10.5)
nRBC: 0 % (ref 0.0–0.2)

## 2020-12-23 LAB — COMPREHENSIVE METABOLIC PANEL
ALT: 23 U/L (ref 0–44)
AST: 22 U/L (ref 15–41)
Albumin: 4.1 g/dL (ref 3.5–5.0)
Alkaline Phosphatase: 61 U/L (ref 38–126)
Anion gap: 7 (ref 5–15)
BUN: 15 mg/dL (ref 6–20)
CO2: 23 mmol/L (ref 22–32)
Calcium: 9 mg/dL (ref 8.9–10.3)
Chloride: 108 mmol/L (ref 98–111)
Creatinine, Ser: 0.63 mg/dL (ref 0.44–1.00)
GFR, Estimated: >60 mL/min (ref >60)
Glucose, Bld: 101 mg/dL — ABNORMAL HIGH (ref 70–99)
Potassium: 3.9 mmol/L (ref 3.5–5.1)
Sodium: 138 mmol/L (ref 135–145)
Total Bilirubin: 0.3 mg/dL (ref 0.3–1.2)
Total Protein: 7.3 g/dL (ref 6.5–8.1)

## 2020-12-23 LAB — VITAMIN D 25 HYDROXY (VIT D DEFICIENCY, FRACTURES): Vit D, 25-Hydroxy: 62.75 ng/mL (ref 30–100)

## 2020-12-23 LAB — VITAMIN B12: Vitamin B-12: 545 pg/mL (ref 180–914)

## 2020-12-23 LAB — LACTATE DEHYDROGENASE: LDH: 161 U/L (ref 98–192)

## 2021-01-03 NOTE — Progress Notes (Deleted)
RESCHEDULED d/t COVID

## 2021-01-06 ENCOUNTER — Ambulatory Visit (HOSPITAL_COMMUNITY): Payer: 59 | Admitting: Hematology

## 2021-01-06 ENCOUNTER — Ambulatory Visit (HOSPITAL_COMMUNITY): Payer: 59 | Admitting: Physician Assistant

## 2021-02-04 ENCOUNTER — Ambulatory Visit: Payer: 59 | Admitting: Family Medicine

## 2021-02-04 NOTE — Progress Notes (Deleted)
CANCEL 

## 2021-02-05 ENCOUNTER — Ambulatory Visit (HOSPITAL_COMMUNITY): Payer: 59 | Admitting: Physician Assistant

## 2021-02-13 ENCOUNTER — Ambulatory Visit: Payer: 59 | Admitting: Family Medicine

## 2021-02-13 ENCOUNTER — Other Ambulatory Visit: Payer: Self-pay

## 2021-02-13 ENCOUNTER — Encounter: Payer: Self-pay | Admitting: Family Medicine

## 2021-02-13 VITALS — BP 150/80 | HR 70 | Resp 16 | Ht 64.0 in | Wt 284.0 lb

## 2021-02-13 DIAGNOSIS — I10 Essential (primary) hypertension: Secondary | ICD-10-CM | POA: Diagnosis not present

## 2021-02-13 DIAGNOSIS — R7303 Prediabetes: Secondary | ICD-10-CM

## 2021-02-13 DIAGNOSIS — E8881 Metabolic syndrome: Secondary | ICD-10-CM | POA: Diagnosis not present

## 2021-02-13 MED ORDER — OLMESARTAN MEDOXOMIL 20 MG PO TABS: 20.0000 mg | ORAL_TABLET | Freq: Every day | ORAL | 3 refills | Status: DC

## 2021-02-13 MED ORDER — OZEMPIC (0.25 OR 0.5 MG/DOSE) 2 MG/1.5ML ~~LOC~~ SOPN: 0.2500 mg | PEN_INJECTOR | SUBCUTANEOUS | 3 refills | Status: DC

## 2021-02-13 NOTE — Patient Instructions (Signed)
F/U in 5 weeks re eval weight ad BP, call if you need me sooner  New for weight loss and to help blood sugar is once weekly ozempic, please let us know if not covered  New for blood pressure is olmesartan 20 mg one daily  EKG today is NORMAL which is GOOD  Work on food choice, reduce snacks and sugar, stop eating at 7 pM  It is important that you exercise regularly at least 30 minutes 5 times a week. If you develop chest pain, have severe difficulty breathing, or feel very tired, stop exercising immediately and seek medical attention   Thanks for choosing  [facility], we consider it a privelige to serve you.

## 2021-02-13 NOTE — Progress Notes (Signed)
Kathleen Reynolds     MRN: [id number]      DOB: 12/22/1960   HPI Kathleen Reynolds is here for follow up and re-evaluation of chronic medical conditions, medication management and review of any available recent lab and radiology data.  Preventive health is updated, specifically  Cancer screening and Immunization.   Questions or concerns regarding consultations or procedures which the PT has had in the interim are  addressed. The PT never filled spironolactone, hoped to lower blood pressure with weight loss which has not happened. Interested in alternat weight loss option.  There are no new concerns.  There are no specific complaints   ROS Denies recent fever or chills. Denies sinus pressure, nasal congestion, ear pain or sore throat. Denies chest congestion, productive cough or wheezing. Denies chest pains, palpitations and leg swelling Denies abdominal pain, nausea, vomiting,diarrhea or constipation.   Denies dysuria, frequency, hesitancy or incontinence. Denies joint pain, swelling and limitation in mobility. Denies headaches, seizures, numbness, or tingling. Denies depression, anxiety or insomnia. Denies skin break down or rash.   PE  BP (!) 150/80    Pulse 70    Resp 16    Ht 5\' 4"  (1.626 m)    Wt 284 lb (128.8 kg)    LMP 03/27/2011    SpO2 93%    BMI 48.75 kg/m   Patient alert and oriented and in no cardiopulmonary distress.  HEENT: No facial asymmetry, EOMI,     Neck supple .  Chest: Clear to auscultation bilaterally.  CVS: S1, S2 no murmurs, no S3.Regular rate. EKG: at visit: NSR, no ischemia, no LVH  ABD: Soft non tender.   Ext: No edema  MS: Adequate ROM spine, shoulders, hips and knees.  Skin: Intact, no ulcerations or rash noted.  Psych: Good eye contact, normal affect. Memory intact not anxious or depressed appearing.  CNS: CN 2-12 intact, power,  normal throughout.no focal deficits noted.   Assessment & Plan  Essential hypertension Needs to commit to  taking med regularly, re educated DASH diet and commitment to daily physical activity for a minimum of 30 minutes discussed and encouraged, as a part of hypertension management. The importance of attaining a healthy weight is also discussed. Baseline EKG done at visit and is NORMAL  BP/Weight 02/13/2021 11/13/2020 05/23/2020 05/14/2020 01/15/2020 01/15/2020 28/41/3244  Systolic BP 010 272 536 644 - 034 742  Diastolic BP 80 82 84 80 - 78 66  Wt. (Lbs) 284 281 - 286 293 293 294.8  BMI 48.75 48.23 - 49.09 50.29 50.29 50.6      start olmesartan  20 mg daily  Obesity, morbid, BMI 40.0-49.9 ([place])  Patient re-educated about  the importance of commitment to a  minimum of 150 minutes of exercise per week as able.  The importance of healthy food choices with portion control discussed, as well as eating regularly and within a 12 hour window most days. The need to choose "clean , green" food 50 to 75% of the time is discussed, as well as to make water the primary drink and set a goal of 64 ounces water daily.    Weight /BMI 02/13/2021 11/13/2020 05/14/2020  WEIGHT 284 lb 281 lb 286 lb  HEIGHT 5\' 4"  5\' 4"  5\' 4"   BMI 48.75 kg/m2 48.23 kg/m2 49.09 kg/m2    Start weekly ozempic  And modify diet  Prediabetes Patient educated about the importance of limiting  Carbohydrate intake , the need to commit to daily physical activity for a  minimum of 30 minutes , and to commit weight loss. The fact that changes in all these areas will reduce or eliminate all together the development of diabetes is stressed.   Diabetic Labs Latest Ref Rng & Units 12/23/2020 11/06/2020 05/07/2020 01/15/2020 12/22/2019  HbA1c 4.8 - 5.6 % - 6.0(H) 6.1(H) 6.2 -  Chol 100 - 199 mg/dL - 202(H) 211(H) - -  HDL >39 mg/dL - 51 57 - -  Calc LDL 0 - 99 mg/dL - 132(H) 136(H) - -  Triglycerides 0 - 149 mg/dL - 107 101 - -  Creatinine 0.44 - 1.00 mg/dL 0.63 0.66 0.70 - 0.66   BP/Weight 02/13/2021 11/13/2020 05/23/2020 05/14/2020 01/15/2020  01/15/2020 12/03/5943  Systolic BP 859 292 446 286 - 381 771  Diastolic BP 80 82 84 80 - 78 66  Wt. (Lbs) 284 281 - 286 293 293 294.8  BMI 48.75 48.23 - 49.09 50.29 50.29 50.6   Foot/eye exam completion dates Latest Ref Rng & Units 07/02/2020  Eye Exam No Retinopathy No Retinopathy  Foot Form Completion - -    Start weekly ozempic and continue metformin  Metabolic syndrome X The increased risk of cardiovascular disease associated with this diagnosis, and the need to consistently work on lifestyle to change this is discussed. Following  a  heart healthy diet ,commitment to 30 minutes of exercise at least 5 days per week, as well as control of blood sugar and cholesterol , and achieving a healthy weight are all the areas to be addressed .

## 2021-02-17 ENCOUNTER — Encounter: Payer: Self-pay | Admitting: Family Medicine

## 2021-02-17 NOTE — Assessment & Plan Note (Signed)
°  Patient re-educated about  the importance of commitment to a  minimum of 150 minutes of exercise per week as able.  The importance of healthy food choices with portion control discussed, as well as eating regularly and within a 12 hour window most days. The need to choose "clean , green" food 50 to 75% of the time is discussed, as well as to make water the primary drink and set a goal of 64 ounces water daily.    Weight /BMI 02/13/2021 11/13/2020 05/14/2020  WEIGHT 284 lb 281 lb 286 lb  HEIGHT 5\' 4"  5\' 4"  5\' 4"   BMI 48.75 kg/m2 48.23 kg/m2 49.09 kg/m2    Start weekly ozempic  And modify diet

## 2021-02-17 NOTE — Assessment & Plan Note (Signed)
The increased risk of cardiovascular disease associated with this diagnosis, and the need to consistently work on lifestyle to change this is discussed. Following  a  heart healthy diet ,commitment to 30 minutes of exercise at least 5 days per week, as well as control of blood sugar and cholesterol , and achieving a healthy weight are all the areas to be addressed .  

## 2021-02-17 NOTE — Assessment & Plan Note (Addendum)
Needs to commit to taking med regularly, re educated DASH diet and commitment to daily physical activity for a minimum of 30 minutes discussed and encouraged, as a part of hypertension management. The importance of attaining a healthy weight is also discussed. Baseline EKG done at visit and is NORMAL  BP/Weight 02/13/2021 11/13/2020 05/23/2020 05/14/2020 01/15/2020 01/15/2020 24/81/8590  Systolic BP 931 121 624 469 - 507 225  Diastolic BP 80 82 84 80 - 78 66  Wt. (Lbs) 284 281 - 286 293 293 294.8  BMI 48.75 48.23 - 49.09 50.29 50.29 50.6      start olmesartan  20 mg daily

## 2021-02-17 NOTE — Assessment & Plan Note (Signed)
Patient educated about the importance of limiting  Carbohydrate intake , the need to commit to daily physical activity for a minimum of 30 minutes , and to commit weight loss. The fact that changes in all these areas will reduce or eliminate all together the development of diabetes is stressed.   Diabetic Labs Latest Ref Rng & Units 12/23/2020 11/06/2020 05/07/2020 01/15/2020 12/22/2019  HbA1c 4.8 - 5.6 % - 6.0(H) 6.1(H) 6.2 -  Chol 100 - 199 mg/dL - 202(H) 211(H) - -  HDL >39 mg/dL - 51 57 - -  Calc LDL 0 - 99 mg/dL - 132(H) 136(H) - -  Triglycerides 0 - 149 mg/dL - 107 101 - -  Creatinine 0.44 - 1.00 mg/dL 0.63 0.66 0.70 - 0.66   BP/Weight 02/13/2021 11/13/2020 05/23/2020 05/14/2020 01/15/2020 01/15/2020 56/38/7564  Systolic BP 332 951 884 166 - 063 016  Diastolic BP 80 82 84 80 - 78 66  Wt. (Lbs) 284 281 - 286 293 293 294.8  BMI 48.75 48.23 - 49.09 50.29 50.29 50.6   Foot/eye exam completion dates Latest Ref Rng & Units 07/02/2020  Eye Exam No Retinopathy No Retinopathy  Foot Form Completion - -    Start weekly ozempic and continue metformin

## 2021-02-26 ENCOUNTER — Other Ambulatory Visit (HOSPITAL_COMMUNITY): Payer: Self-pay

## 2021-02-26 DIAGNOSIS — Z171 Estrogen receptor negative status [ER-]: Secondary | ICD-10-CM

## 2021-02-26 DIAGNOSIS — C50911 Malignant neoplasm of unspecified site of right female breast: Secondary | ICD-10-CM

## 2021-02-26 NOTE — Progress Notes (Signed)
Labs ordered per plan of Dr. Tomie China last office note 12/25/19.

## 2021-02-27 ENCOUNTER — Inpatient Hospital Stay (HOSPITAL_COMMUNITY): Payer: 59 | Attending: Hematology

## 2021-02-27 ENCOUNTER — Other Ambulatory Visit: Payer: Self-pay

## 2021-02-27 DIAGNOSIS — Z853 Personal history of malignant neoplasm of breast: Secondary | ICD-10-CM | POA: Diagnosis present

## 2021-02-27 DIAGNOSIS — C50912 Malignant neoplasm of unspecified site of left female breast: Secondary | ICD-10-CM

## 2021-02-27 DIAGNOSIS — Z9221 Personal history of antineoplastic chemotherapy: Secondary | ICD-10-CM | POA: Diagnosis not present

## 2021-02-27 DIAGNOSIS — Z9013 Acquired absence of bilateral breasts and nipples: Secondary | ICD-10-CM | POA: Diagnosis not present

## 2021-02-27 DIAGNOSIS — E119 Type 2 diabetes mellitus without complications: Secondary | ICD-10-CM | POA: Insufficient documentation

## 2021-02-27 DIAGNOSIS — Z79899 Other long term (current) drug therapy: Secondary | ICD-10-CM | POA: Insufficient documentation

## 2021-02-27 DIAGNOSIS — C50911 Malignant neoplasm of unspecified site of right female breast: Secondary | ICD-10-CM

## 2021-02-27 LAB — COMPREHENSIVE METABOLIC PANEL
ALT: 22 U/L (ref 0–44)
AST: 20 U/L (ref 15–41)
Albumin: 4.1 g/dL (ref 3.5–5.0)
Alkaline Phosphatase: 55 U/L (ref 38–126)
Anion gap: 10 (ref 5–15)
BUN: 16 mg/dL (ref 6–20)
CO2: 23 mmol/L (ref 22–32)
Calcium: 9 mg/dL (ref 8.9–10.3)
Chloride: 105 mmol/L (ref 98–111)
Creatinine, Ser: 0.72 mg/dL (ref 0.44–1.00)
GFR, Estimated: >60 mL/min (ref >60)
Glucose, Bld: 106 mg/dL — ABNORMAL HIGH (ref 70–99)
Potassium: 3.5 mmol/L (ref 3.5–5.1)
Sodium: 138 mmol/L (ref 135–145)
Total Bilirubin: 0.6 mg/dL (ref 0.3–1.2)
Total Protein: 7.2 g/dL (ref 6.5–8.1)

## 2021-02-27 LAB — VITAMIN D 25 HYDROXY (VIT D DEFICIENCY, FRACTURES): Vit D, 25-Hydroxy: 38.77 ng/mL (ref 30–100)

## 2021-02-27 LAB — CBC WITH DIFFERENTIAL/PLATELET
Abs Immature Granulocytes: 0.01 10*3/uL (ref 0.00–0.07)
Basophils Absolute: 0 10*3/uL (ref 0.0–0.1)
Basophils Relative: 1 %
Eosinophils Absolute: 0.1 10*3/uL (ref 0.0–0.5)
Eosinophils Relative: 1 %
HCT: 40.1 % (ref 36.0–46.0)
Hemoglobin: 13.2 g/dL (ref 12.0–15.0)
Immature Granulocytes: 0 %
Lymphocytes Relative: 39 %
Lymphs Abs: 1.7 10*3/uL (ref 0.7–4.0)
MCH: 30.6 pg (ref 26.0–34.0)
MCHC: 32.9 g/dL (ref 30.0–36.0)
MCV: 92.8 fL (ref 80.0–100.0)
Monocytes Absolute: 0.3 10*3/uL (ref 0.1–1.0)
Monocytes Relative: 6 %
Neutro Abs: 2.3 10*3/uL (ref 1.7–7.7)
Neutrophils Relative %: 53 %
Platelets: 169 10*3/uL (ref 150–400)
RBC: 4.32 MIL/uL (ref 3.87–5.11)
RDW: 15.1 % (ref 11.5–15.5)
WBC: 4.3 10*3/uL (ref 4.0–10.5)
nRBC: 0 % (ref 0.0–0.2)

## 2021-02-27 LAB — VITAMIN B12: Vitamin B-12: 537 pg/mL (ref 180–914)

## 2021-02-27 LAB — LACTATE DEHYDROGENASE: LDH: 155 U/L (ref 98–192)

## 2021-03-05 NOTE — Progress Notes (Signed)
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    CANCER STAGING: Cancer Staging  Invasive ductal carcinoma of right breast, stage 1 Staging form: Breast, AJCC 6th Edition - Clinical: Stage I (T1b, N0, M0) - Signed by Baird Cancer, PA on 04/15/2011   INTERVAL HISTORY:  [name], a 61 y.o. 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  She was last seen by Dr. Delton Coombes on 12/25/2019.  At today's visit, she reports feeling well.  She denies any recent hospitalizations, surgeries, or changes in her baseline health status.  Most recent right-sided mammogram on 12/23/2020 showed scattered areas of fibroglandular density, but no findings suspicious for malignancy (BI-RADS Category 1, negative).  She denies any symptoms of recurrence such as new lumps, bone pain, chest pain, dyspnea, or abdominal pain.  She has has chronic migraines, but denies any new headaches, seizures, or focal neurologic deficits.  She has frequent night sweats, but denies any fevers, chills, or unintentional weight loss.  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  She reports little to no energy and 100% appetite.  She is maintaining stable weight at this time.   REVIEW OF SYSTEMS:  Review of Systems  Constitutional:  Positive for  fatigue. Negative for appetite change, chills, diaphoresis, fever and unexpected weight change.  HENT:   Negative for lump/mass and nosebleeds.   Eyes:  Negative for eye problems.  Respiratory:  Negative for cough, hemoptysis and shortness of breath.   Cardiovascular:  Negative for chest pain, leg swelling and palpitations.  Gastrointestinal:  Negative for abdominal pain, blood in stool, constipation, diarrhea, nausea and vomiting.  Genitourinary:  Negative for hematuria.   Skin: Negative.   Neurological:  Positive for dizziness, headaches and numbness. Negative for light-headedness.  Hematological:  Does not bruise/bleed easily.  Psychiatric/Behavioral:  Positive for sleep disturbance.     PAST MEDICAL/SURGICAL HISTORY:  Past Medical History:  Diagnosis Date   Anemia LIFELONG   HEAVY MENSES MAR 2012 HB 13 MCV 86.7 FERRITIN  66   Arthritis    Breast cancer (HCC)    Right   Breast mass in female    right   Colonic adenoma 02/05/2011   Diabetes mellitus without complication ([facility])    pre diabetic   Hyperlipidemia    Left rotator cuff tear    Migraines    Morbid obesity ([facility])    Personal history of chemotherapy    Personal history of radiation therapy    Pre-diabetes    RUPTURE ROTATOR CUFF 06/08/2007   Qualifier: Diagnosis of  By: Aline Brochure MD, Adele Dan, Achilles, right    Urticaria    Past Surgical History:  Procedure Laterality Date   ACHILLES TENDON SURGERY Right 09/22/2019   Procedure: RIGHT ACHILLES TENDON RECONSTRUCTION;  Surgeon: Newt Minion, MD;  Location: [facility];  Service: Orthopedics;  Laterality:  Right;   BREAST BIOPSY  02/13/2011   Procedure: BREAST BIOPSY WITH NEEDLE LOCALIZATION;  Surgeon: Adin Hector, MD;  Location: [facility];  Service: General;  Laterality: Right;   BREAST LUMPECTOMY Right 2013   BREAST SURGERY  11/1997   Left breast cancer-mastectomy   BREAST SURGERY  2/13-   rt axilly bx   COLONOSCOPY  2004 DR. SMITH   No  polyp, hemorrhoids   COLONOSCOPY  06/09/10   [id number] adenomas    COLONOSCOPY N/A 08/19/2015   Procedure: COLONOSCOPY;  Surgeon: Danie Binder, MD;  [facility];  Service: Endoscopy;  Laterality: N/A;  130 - moved to 12:45 - Ginger notified pt   COLONOSCOPY WITH PROPOFOL N/A 11/29/2018   Fields: Past simple adenomas removed, diverticulosis, hemorrhoids.  Next colonoscopy in 3 years.   DILATION AND CURETTAGE OF UTERUS     DILATION AND CURETTAGE OF UTERUS  05/2002   hystersonogram  2009   MASTECTOMY  1999   left breast cancer    POLYPECTOMY  11/29/2018   Procedure: POLYPECTOMY;  Surgeon: Danie Binder, MD;  [facility];  Service: Endoscopy;;  colon   PORT-A-CATH REMOVAL  10/20/2011   Procedure: MINOR REMOVAL PORT-A-CATH;  Surgeon: Adin Hector, MD;  Location: [facility];  Service: General;  Laterality: N/A;  Porta-cath removal  left   PORTACATH PLACEMENT  03/27/2011   Procedure: INSERTION PORT-A-CATH;  Surgeon: Adin Hector, MD;  Location: [facility];  Service: General;  Laterality: Left;  insert port a cath    SOCIAL HISTORY:  Social History   Socioeconomic History   Marital status: Single    Spouse name: Not on file   Number of children: Not on file   Years of education: Not on file   Highest education level: Not on file  Occupational History   Not on file  Tobacco Use   Smoking status: Never   Smokeless tobacco: Never  Vaping Use   Vaping Use: Never used  Substance and Sexual Activity   Alcohol use: Yes    Alcohol/week: 2.0 standard drinks    Types: 2 Standard drinks or equivalent per week    Comment: occasional;    Drug use: No   Sexual activity: Yes    Birth control/protection: None  Other Topics Concern   Not on file  Social History Narrative   Single, 1 kid-61 yo female-lives with mom   Employed as a CNA   Rare Etoh.   No tobacco products   Social Determinants of Radio broadcast assistant Strain:  Not on file  Food Insecurity: Not on file  Transportation Needs: Not on file  Physical Activity: Not on file  Stress: Not on file  Social Connections: Not on file  Intimate Partner Violence: Not on file    FAMILY HISTORY:  Family History  Problem Relation Age of Onset   Hypertension Mother    Cancer Father        lung cancer   Diabetes Sister    Cancer Brother        colon cancer, younger than the age of 87   Heart disease Sister        heart attack   Colon polyps Sister    Asthma Sister    Eczema Sister    Allergic rhinitis Neg Hx    Urticaria Neg Hx     CURRENT MEDICATIONS:  Current Outpatient Medications  Medication Sig Dispense Refill  Ascorbic Acid (VITAMIN C) 500 MG CAPS Take 500 mg by mouth daily.      Biotin w/ Vitamins C & E (HAIR/SKIN/NAILS PO) Take 2 tablets by mouth daily.     butalbital-acetaminophen-caffeine (FIORICET) 50-325-40 MG tablet Take one tablet by mouth two times daily as needed, for headache (Patient taking differently: Take 1-2 tablets by mouth 2 (two) times daily as needed for headache or migraine.) 20 tablet 0   calcium carbonate (OSCAL) 1500 (600 Ca) MG TABS tablet Take 600 mg of elemental calcium by mouth 2 (two) times daily with a meal.     cyclobenzaprine (FLEXERIL) 10 MG tablet Take 1 tablet (10 mg total) by mouth 2 (two) times daily as needed for muscle spasms. 20 tablet 0   ELDERBERRY PO Take 1,000 mg by mouth daily.     meclizine (ANTIVERT) 25 MG tablet Take 1 tablet (25 mg total) by mouth 3 (three) times daily as needed for dizziness. 30 tablet 2   metFORMIN (GLUCOPHAGE) 500 MG tablet Take 1 tablet (500 mg total) by mouth daily with breakfast. 90 tablet 3   Multiple Vitamin (MULITIVITAMIN WITH MINERALS) TABS Take 1 tablet by mouth daily.     olmesartan (BENICAR) 20 MG tablet Take 1 tablet (20 mg total) by mouth daily. 30 tablet 3   Semaglutide,0.25 or 0.5MG/DOS, (OZEMPIC, 0.25 OR 0.5 MG/DOSE,) 2 MG/1.5ML SOPN Inject 0.25 mg into the skin  once a week. 2 mL 3   Vitamin D, Ergocalciferol, (DRISDOL) 1.25 MG (50000 UNIT) CAPS capsule TAKE 1 CAPSULE BY MOUTH ONE TIME PER WEEK 12 capsule 1   zinc gluconate 50 MG tablet Take 50 mg by mouth daily.     No current facility-administered medications for this visit.    ALLERGIES:  Allergies  Allergen Reactions   Nsaids Anaphylaxis    Ibuprofen required hospitalization in 2016 due to ibuprofen   Ibuprofen Itching    PHYSICAL EXAM:  Performance status (ECOG): 1 - Symptomatic but completely ambulatory  There were no vitals filed for this visit. Wt Readings from Last 3 Encounters:  02/13/21 284 lb (128.8 kg)  11/13/20 281 lb (127.5 kg)  05/14/20 286 lb (129.7 kg)   Physical Exam Constitutional:      Appearance: Normal appearance. She is morbidly obese.  HENT:     Head: Normocephalic and atraumatic.     Mouth/Throat:     Mouth: Mucous membranes are moist.  Eyes:     Extraocular Movements: Extraocular movements intact.     Pupils: Pupils are equal, round, and reactive to light.  Cardiovascular:     Rate and Rhythm: Normal rate and regular rhythm.     Pulses: Normal pulses.     Heart sounds: Normal heart sounds.  Pulmonary:     Effort: Pulmonary effort is normal.     Breath sounds: Normal breath sounds.  Chest:  Breasts:    Right: Tenderness present.     Left: Tenderness present.    Abdominal:     General: Bowel sounds are normal.     Palpations: Abdomen is soft.     Tenderness: There is no abdominal tenderness.  Musculoskeletal:        General: No swelling.     Right lower leg: No edema.     Left lower leg: No edema.  Lymphadenopathy:     Cervical: No cervical adenopathy.     Upper Body:     Right upper body: No supraclavicular, axillary or pectoral adenopathy.     Left  upper body: Axillary adenopathy present. No supraclavicular or pectoral adenopathy.  Skin:    General: Skin is warm and dry.  Neurological:     General: No focal deficit present.     Mental  Status: She is alert and oriented to person, place, and time.  Psychiatric:        Mood and Affect: Mood normal.        Behavior: Behavior normal.     LABORATORY DATA:  I have reviewed the labs as listed.  CBC Latest Ref Rng & Units 02/27/2021 12/23/2020 11/06/2020  WBC 4.0 - 10.5 K/uL 4.3 4.5 4.6  Hemoglobin 12.0 - 15.0 g/dL 13.2 13.8 13.1  Hematocrit 36.0 - 46.0 % 40.1 40.1 38.9  Platelets 150 - 400 K/uL 169 190 194   CMP Latest Ref Rng & Units 02/27/2021 12/23/2020 11/06/2020  Glucose 70 - 99 mg/dL 106(H) 101(H) 87  BUN 6 - 20 mg/dL 16 15 12   Creatinine 0.44 - 1.00 mg/dL 0.72 0.63 0.66  Sodium 135 - 145 mmol/L 138 138 140  Potassium 3.5 - 5.1 mmol/L 3.5 3.9 4.1  Chloride 98 - 111 mmol/L 105 108 105  CO2 22 - 32 mmol/L 23 23 18(L)  Calcium 8.9 - 10.3 mg/dL 9.0 9.0 9.1  Total Protein 6.5 - 8.1 g/dL 7.2 7.3 -  Total Bilirubin 0.3 - 1.2 mg/dL 0.6 0.3 -  Alkaline Phos 38 - 126 U/L 55 61 -  AST 15 - 41 U/L 20 22 -  ALT 0 - 44 U/L 22 23 -    DIAGNOSTIC IMAGING:  I have independently reviewed the scans and discussed with the patient. No results found.   ASSESSMENT & PLAN: 1.  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 - Patient had a left mastectomy on 11/27/1997.  Showed a 2.3 cm primary mass with 7- lymph nodes.  BRCA 1 positive. -She was treated with AC x4 cycles followed by Taxol with 4 cycles. - Patient was diagnosed with right-sided triple negative breast cancer in 2013. - Dr. Fanny Skates performed a right lumpectomy with sentinel node biopsy on 02/13/2011.  It showed triple negative breast cancer with negative lymph nodes.  Her Ki-67 marker was 54%. -A right mastectomy was recommended at this time but she chose to have the lumpectomy instead. - She has now status post 6 cycles of carboplatin and Taxotere and status post radiation. - Her last right-sided mammogram on 12/23/2020 showed  scattered areas of fibroglandular density, but no findings suspicious for malignancy (BI-RADS Category 1, negative). - Physical exam significant for small left axillary lymphadenopathy as well as hard fixed nodules beneath the right nipple (further described in physical exam section above).  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 - Labs (12/23/2020): Grossly normal CBC, CMP, B12, vitamin D, LDH - PLAN: Due to abnormalities on exam, we will check diagnostic mammogram with ultrasound. - RTC after mammogram to discuss results and next steps.     2.  Bone health - DEXA scan done on 12/19/2018 showed T score of 0.6, which is normal - Most recent vitamin D (12/23/2020) normal at 62.75 - PLAN: Continue calcium, vitamin D, and weightbearing exercises.  PLAN SUMMARY & DISPOSITION: Diagnostic mammogram and ultrasound RTC after imaging studies to discuss results  All questions were answered. The patient knows to call the clinic with any problems, questions or concerns.  Medical decision making: Moderate  Time spent on visit: I spent 20 minutes counseling the patient face to face. The total time spent in the appointment was 30 minutes and more than 50% was on counseling.   Harriett Rush, PA-C  03/06/2021 10:46 AM

## 2021-03-06 ENCOUNTER — Inpatient Hospital Stay (HOSPITAL_COMMUNITY): Payer: 59 | Attending: Hematology | Admitting: Physician Assistant

## 2021-03-06 ENCOUNTER — Other Ambulatory Visit: Payer: Self-pay

## 2021-03-06 VITALS — BP 127/78 | HR 65 | Temp 98.2°F | Resp 16 | Ht 64.0 in | Wt 280.6 lb

## 2021-03-06 DIAGNOSIS — E119 Type 2 diabetes mellitus without complications: Secondary | ICD-10-CM | POA: Insufficient documentation

## 2021-03-06 DIAGNOSIS — Z1509 Genetic susceptibility to other malignant neoplasm: Secondary | ICD-10-CM | POA: Insufficient documentation

## 2021-03-06 DIAGNOSIS — Z171 Estrogen receptor negative status [ER-]: Secondary | ICD-10-CM | POA: Diagnosis not present

## 2021-03-06 DIAGNOSIS — N6310 Unspecified lump in the right breast, unspecified quadrant: Secondary | ICD-10-CM | POA: Insufficient documentation

## 2021-03-06 DIAGNOSIS — Z9013 Acquired absence of bilateral breasts and nipples: Secondary | ICD-10-CM | POA: Diagnosis not present

## 2021-03-06 DIAGNOSIS — Z853 Personal history of malignant neoplasm of breast: Secondary | ICD-10-CM | POA: Insufficient documentation

## 2021-03-06 DIAGNOSIS — Z801 Family history of malignant neoplasm of trachea, bronchus and lung: Secondary | ICD-10-CM | POA: Diagnosis not present

## 2021-03-06 DIAGNOSIS — C50911 Malignant neoplasm of unspecified site of right female breast: Secondary | ICD-10-CM | POA: Diagnosis not present

## 2021-03-06 DIAGNOSIS — Z800 Family history of malignant neoplasm of digestive organs: Secondary | ICD-10-CM | POA: Diagnosis not present

## 2021-03-06 DIAGNOSIS — R590 Localized enlarged lymph nodes: Secondary | ICD-10-CM | POA: Insufficient documentation

## 2021-03-06 DIAGNOSIS — Z9221 Personal history of antineoplastic chemotherapy: Secondary | ICD-10-CM | POA: Diagnosis not present

## 2021-03-06 DIAGNOSIS — Z1501 Genetic susceptibility to malignant neoplasm of breast: Secondary | ICD-10-CM | POA: Insufficient documentation

## 2021-03-06 DIAGNOSIS — Z79899 Other long term (current) drug therapy: Secondary | ICD-10-CM | POA: Insufficient documentation

## 2021-03-06 DIAGNOSIS — C50912 Malignant neoplasm of unspecified site of left female breast: Secondary | ICD-10-CM | POA: Diagnosis not present

## 2021-03-06 DIAGNOSIS — R61 Generalized hyperhidrosis: Secondary | ICD-10-CM | POA: Diagnosis not present

## 2021-03-06 NOTE — Patient Instructions (Signed)
[facility] at [facility] Discharge Instructions  You were seen today by Tarri Abernethy PA-C for your history of bilateral breast cancer.  Because of some abnormalities on exam, we will check diagnostic mammogram with ultrasound to make sure that these abnormalities are nothing to worry about.  We will schedule you for follow-up visit after mammogram.    Thank you for choosing [facility] at [facility] to provide your oncology and hematology care.  To afford each patient quality time with our provider, please arrive at least 15 minutes before your scheduled appointment time.   If you have a lab appointment with the [facility] please come in thru the Main Entrance and check in at the main information desk.  You need to re-schedule your appointment should you arrive 10 or more minutes late.  We strive to give you quality time with our providers, and arriving late affects you and other patients whose appointments are after yours.  Also, if you no show three or more times for appointments you may be dismissed from the clinic at the providers discretion.     Again, thank you for choosing [facility].  Our hope is that these requests will decrease the amount of time that you wait before being seen by our physicians.       _____________________________________________________________  Should you have questions after your visit to [facility], please contact our office at [phone] and follow the prompts.  Our office hours are 8:00 a.m. and 4:30 p.m. Monday - Friday.  Please note that voicemails left after 4:00 p.m. may not be returned until the following business day.  We are closed weekends and major holidays.  You do have access to a nurse 24-7, just call the main number to the clinic [phone] and do not press any options, hold on the line and a nurse will answer the phone.    For prescription refill requests,  have your pharmacy contact our office and allow 72 hours.    Due to Covid, you will need to wear a mask upon entering the hospital. If you do not have a mask, a mask will be given to you at the Main Entrance upon arrival. For doctor visits, patients may have 1 support person age 63 or older with them. For treatment visits, patients can not have anyone with them due to social distancing guidelines and our immunocompromised population.

## 2021-03-25 ENCOUNTER — Other Ambulatory Visit (HOSPITAL_COMMUNITY): Payer: Self-pay | Admitting: *Deleted

## 2021-03-25 ENCOUNTER — Other Ambulatory Visit: Payer: Self-pay

## 2021-03-25 ENCOUNTER — Ambulatory Visit (HOSPITAL_COMMUNITY)
Admission: RE | Admit: 2021-03-25 | Discharge: 2021-03-25 | Disposition: A | Discharge status: Home / Self Care | Payer: 59 | Source: Ambulatory Visit | Attending: Physician Assistant | Admitting: Physician Assistant

## 2021-03-25 DIAGNOSIS — Z171 Estrogen receptor negative status [ER-]: Secondary | ICD-10-CM | POA: Diagnosis present

## 2021-03-25 DIAGNOSIS — C50912 Malignant neoplasm of unspecified site of left female breast: Secondary | ICD-10-CM

## 2021-03-25 DIAGNOSIS — R590 Localized enlarged lymph nodes: Secondary | ICD-10-CM | POA: Diagnosis present

## 2021-03-25 DIAGNOSIS — C50911 Malignant neoplasm of unspecified site of right female breast: Secondary | ICD-10-CM | POA: Diagnosis present

## 2021-03-25 NOTE — Progress Notes (Signed)
Virtual Visit via Telephone Note [facility]  I connected with [facility]  on 03/26/21  at  9:38 AM  by telephone and verified that I am speaking with the correct person using two identifiers.  Location: Patient: Home Provider: [facility]   I discussed the limitations, risks, security and privacy concerns of performing an evaluation and management service by telephone and the availability of in person appointments. I also discussed with the patient that there may be a patient responsible charge related to this service. The patient expressed understanding and agreed to proceed.   HISTORY OF PRESENT ILLNESS: [name] is followed at our clinic for her history of bilateral triple negative breast cancer s/p left mastectomy and right-sided lumpectomy.  She was seen for follow-up visit by Tarri Abernethy PA-C on 03/06/2021, and had some palpable abnormalities on breast exam, including small left axillary lymphadenopathy and hard fixed nodules beneath the right nipple.  Therefore, she was sent for diagnostic mammogram, and is contacted today to discuss those results and next steps.    She reports that she is doing fairly well today.  She has not had any changes since her visit 2 weeks ago.  She continues to have some mild chronic fatigue with energy about 75%, but her appetite is 100%.  Other complaints noted below in ROS.    OBSERVATIONS/OBJECTIVE: Review of Systems  Constitutional:  Positive for malaise/fatigue. Negative for chills, diaphoresis, fever and weight loss.  Respiratory:  Negative for cough and shortness of breath.   Cardiovascular:  Negative for chest pain and palpitations.  Gastrointestinal:  Negative for abdominal pain, blood in stool, melena, nausea and vomiting.  Neurological:  Positive for dizziness, tingling and headaches.  Psychiatric/Behavioral:  The patient has insomnia.     PHYSICAL EXAM (per limitations of virtual  telephone visit): The patient is alert and oriented x 3, exhibiting adequate mentation, good mood, and ability to speak in full sentences and execute sound judgement.   ASSESSMENT & PLAN: 1.  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 - Patient had a left mastectomy on 11/27/1997.  Showed a 2.3 cm primary mass with 7- lymph nodes.  BRCA 1 positive. -She was treated with AC x4 cycles followed by Taxol with 4 cycles. - Patient was diagnosed with right-sided triple negative breast cancer in 2013. - Dr. Fanny Skates performed a right lumpectomy with sentinel node biopsy on 02/13/2011.  It showed triple negative breast cancer with negative lymph nodes.  Her Ki-67 marker was 54%. -A right mastectomy was recommended at this time but she chose to have the lumpectomy instead. - She has now status post 6 cycles of carboplatin and Taxotere and status post radiation. - Her last right-sided mammogram on 12/23/2020 showed scattered areas of fibroglandular density, but no findings suspicious for malignancy (BI-RADS Category 1, negative). - Labs (12/23/2020): Grossly normal CBC, CMP, B12, vitamin D, LDH - Physical exam at visit on 03/06/2021 was significant for small left axillary lymphadenopathy as well as hard fixed nodules beneath the right nipple.  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 - Diagnostic mammogram and ultrasound (03/25/2021): Benign right breast fat necrosis corresponding with patient's palpable lump.  Unremarkable ultrasound evaluation of left axilla. - PLAN: We will set patient up for repeat screening mammogram, labs, and office visit in 1 year.  2.  Bone health - DEXA scan done on 12/19/2018 showed T score of 0.6, which is normal - Most recent vitamin D (12/23/2020) normal at 62.75 - PLAN: Continue calcium, vitamin D, and weightbearing  exercises.    I discussed the assessment and treatment plan with the patient. The patient was provided an opportunity to ask questions and all were answered. The patient agreed with the plan and demonstrated an understanding of the instructions.   The patient was advised to call back or seek an in-person evaluation if the symptoms worsen or if the condition fails to improve as anticipated.  I provided 5 minutes of non-face-to-face time during this encounter.   Harriett Rush, PA-C 03/26/21 9:45 AM

## 2021-03-26 ENCOUNTER — Ambulatory Visit: Payer: 59 | Admitting: Family Medicine

## 2021-03-26 ENCOUNTER — Inpatient Hospital Stay (HOSPITAL_BASED_OUTPATIENT_CLINIC_OR_DEPARTMENT_OTHER): Payer: 59 | Admitting: Physician Assistant

## 2021-03-26 DIAGNOSIS — C50912 Malignant neoplasm of unspecified site of left female breast: Secondary | ICD-10-CM

## 2021-03-26 DIAGNOSIS — C50911 Malignant neoplasm of unspecified site of right female breast: Secondary | ICD-10-CM | POA: Diagnosis not present

## 2021-03-26 DIAGNOSIS — Z171 Estrogen receptor negative status [ER-]: Secondary | ICD-10-CM

## 2021-04-09 ENCOUNTER — Other Ambulatory Visit: Payer: Self-pay

## 2021-04-09 ENCOUNTER — Encounter: Payer: Self-pay | Admitting: Family Medicine

## 2021-04-09 ENCOUNTER — Ambulatory Visit: Payer: 59 | Admitting: Family Medicine

## 2021-04-09 VITALS — BP 150/88 | HR 59 | Ht 64.0 in | Wt 284.0 lb

## 2021-04-09 DIAGNOSIS — E559 Vitamin D deficiency, unspecified: Secondary | ICD-10-CM

## 2021-04-09 DIAGNOSIS — R7303 Prediabetes: Secondary | ICD-10-CM

## 2021-04-09 DIAGNOSIS — I10 Essential (primary) hypertension: Secondary | ICD-10-CM | POA: Diagnosis not present

## 2021-04-09 DIAGNOSIS — R42 Dizziness and giddiness: Secondary | ICD-10-CM

## 2021-04-09 MED ORDER — WEGOVY 0.25 MG/0.5ML ~~LOC~~ SOAJ: 0.2500 mg | SUBCUTANEOUS | 0 refills | Status: DC

## 2021-04-09 NOTE — Assessment & Plan Note (Signed)
Needs to comply with medication ?DASH diet and commitment to daily physical activity for a minimum of 30 minutes discussed and encouraged, as a part of hypertension management. ?The importance of attaining a healthy weight is also discussed. ? ?BP/Weight 04/09/2021 03/06/2021 02/13/2021 11/13/2020 05/23/2020 05/14/2020 01/15/2020  ?Systolic BP 034 917 915 056 159 136 -  ?Diastolic BP 88 78 80 82 84 80 -  ?Wt. (Lbs) 284 280.65 284 281 - 286 293  ?BMI 48.75 48.17 48.75 48.23 - 49.09 50.29  ? ? ? ? ?

## 2021-04-09 NOTE — Patient Instructions (Signed)
F/U in 5 weeks re eval blood pressure ? ?Need to take Benicar every day as prescribed ? ?Will be able to start phentermine for weight loss after blood pressure is normal ? ?It is important that you exercise regularly at least 30 minutes 5 times a week. If you develop chest pain, have severe difficulty breathing, or feel very tired, stop exercising immediately and seek medical attention  ? ?  Think about what you will eat, plan ahead. ?Choose " clean, green, fresh or frozen" over canned, processed or packaged foods which are more sugary, salty and fatty. ?70 to 75% of food eaten should be vegetables and fruit. ?Three meals at set times with snacks allowed between meals, but they must be fruit or vegetables. ?Aim to eat over a 12 hour period , example 7 am to 7 pm, and STOP after  your last meal of the day. ?Drink water,generally about 64 ounces per day, no other drink is as healthy. Fruit juice is best enjoyed in a healthy way, by EATING the fruit. ? ?Thanks for choosing [facility], we consider it a privelige to serve you. ? ?

## 2021-04-14 ENCOUNTER — Encounter: Payer: Self-pay | Admitting: Family Medicine

## 2021-04-14 NOTE — Assessment & Plan Note (Signed)
Responds to as needed antivert, no recent flares ?

## 2021-04-14 NOTE — Assessment & Plan Note (Signed)
Weekly vit D, continue ?Updated lab needed at/ before next visit. ? ?

## 2021-04-14 NOTE — Progress Notes (Signed)
? Kathleen Reynolds     MRN: [id number]      DOB: October 30, 1960 ? ? ?HPI ?[name] is here for follow up and re-evaluation of chronic medical conditions, medication management and review of any available recent lab and radiology data.  ?Preventive health is updated, specifically  Cancer screening and Immunization.   ?Questions or concerns regarding consultations or procedures which the PT has had in the interim are  addressed. ?The PT denies any adverse reactions to current medications since the last visit.  ?There are no new concerns.  ?There are no specific complaints  ? ?ROS ?Denies recent fever or chills. ?Denies sinus pressure, nasal congestion, ear pain or sore throat. ?Denies chest congestion, productive cough or wheezing. ?Denies chest pains, palpitations and leg swelling ?Denies abdominal pain, nausea, vomiting,diarrhea or constipation.   ?Denies dysuria, frequency, hesitancy or incontinence. ?Chronic  joint pain, swelling and limitation in mobility. ?Denies headaches, seizures, numbness, or tingling. ?Denies depression, anxiety or insomnia. ?Denies skin break down or rash. ? ? ?PE ? ?BP (!) 150/88   Pulse (!) 59   Ht '5\' 4"'$  (1.626 m)   Wt 284 lb (128.8 kg)   LMP 03/27/2011   SpO2 96%   BMI 48.75 kg/m?  ? ?Patient alert and oriented and in no cardiopulmonary distress. ? ?HEENT: No facial asymmetry, EOMI,     Neck supple . ? ?Chest: Clear to auscultation bilaterally. ? ?CVS: S1, S2 no murmurs, no S3.Regular rate. ? ?ABD: Soft non tender.  ? ?Ext: No edema ? ?MS: decreased  ROM spine, shoulders, hips and knees. ? ?Skin: Intact, no ulcerations or rash noted. ? ?Psych: Good eye contact, normal affect. Memory intact not anxious or depressed appearing. ? ?CNS: CN 2-12 intact, power,  normal throughout.no focal deficits noted. ? ? ?Assessment & Plan ? ?Essential hypertension ?Needs to comply with medication ?DASH diet and commitment to daily physical activity for a minimum of 30 minutes discussed and  encouraged, as a part of hypertension management. ?The importance of attaining a healthy weight is also discussed. ? ?BP/Weight 04/09/2021 03/06/2021 02/13/2021 11/13/2020 05/23/2020 05/14/2020 01/15/2020  ?Systolic BP 916 384 665 993 159 136 -  ?Diastolic BP 88 78 80 82 84 80 -  ?Wt. (Lbs) 284 280.65 284 281 - 286 293  ?BMI 48.75 48.17 48.75 48.23 - 49.09 50.29  ? ? ? ? ? ?Obesity, morbid, BMI 40.0-49.9 ([place]) ? ?Patient re-educated about  the importance of commitment to a  minimum of 150 minutes of exercise per week as able. ? ?The importance of healthy food choices with portion control discussed, as well as eating regularly and within a 12 hour window most days. ?The need to choose "clean , green" food 50 to 75% of the time is discussed, as well as to make water the primary drink and set a goal of 64 ounces water daily. ? ?  ?Weight /BMI 04/09/2021 03/06/2021 02/13/2021  ?WEIGHT 284 lb 280 lb 10.3 oz 284 lb  ?HEIGHT '5\' 4"'$  '5\' 4"'$  '5\' 4"'$   ?BMI 48.75 kg/m2 48.17 kg/m2 48.75 kg/m2  ? ? ? ? ?Prediabetes ?Patient educated about the importance of limiting  Carbohydrate intake , the need to commit to daily physical activity for a minimum of 30 minutes , and to commit weight loss. ?The fact that changes in all these areas will reduce or eliminate all together the development of diabetes is stressed.  ?Continue metformin as before ? ?Diabetic Labs Latest Ref Rng & Units 02/27/2021 12/23/2020 11/06/2020 05/07/2020 01/15/2020  ?  HbA1c 4.8 - 5.6 % - - 6.0(H) 6.1(H) 6.2  ?Chol 100 - 199 mg/dL - - 202(H) 211(H) -  ?HDL >39 mg/dL - - 51 57 -  ?Calc LDL 0 - 99 mg/dL - - 132(H) 136(H) -  ?Triglycerides 0 - 149 mg/dL - - 107 101 -  ?Creatinine 0.44 - 1.00 mg/dL 0.72 0.63 0.66 0.70 -  ? ?BP/Weight 04/09/2021 03/06/2021 02/13/2021 11/13/2020 05/23/2020 05/14/2020 01/15/2020  ?Systolic BP 902 409 735 329 159 136 -  ?Diastolic BP 88 78 80 82 84 80 -  ?Wt. (Lbs) 284 280.65 284 281 - 286 293  ?BMI 48.75 48.17 48.75 48.23 - 49.09 50.29  ? ?Foot/eye exam completion  dates Latest Ref Rng & Units 07/02/2020  ?Eye Exam No Retinopathy No Retinopathy  ?Foot Form Completion - -  ? ? ? ? ?Vitamin D deficiency ?Weekly vit D, continue ?Updated lab needed at/ before next visit. ? ? ?Vertigo, intermittent ?Responds to as needed antivert, no recent flares ? ?

## 2021-04-14 NOTE — Assessment & Plan Note (Signed)
Patient educated about the importance of limiting  Carbohydrate intake , the need to commit to daily physical activity for a minimum of 30 minutes , and to commit weight loss. ?The fact that changes in all these areas will reduce or eliminate all together the development of diabetes is stressed.  ?Continue metformin as before ? ?Diabetic Labs Latest Ref Rng & Units 02/27/2021 12/23/2020 11/06/2020 05/07/2020 01/15/2020  ?HbA1c 4.8 - 5.6 % - - 6.0(H) 6.1(H) 6.2  ?Chol 100 - 199 mg/dL - - 202(H) 211(H) -  ?HDL >39 mg/dL - - 51 57 -  ?Calc LDL 0 - 99 mg/dL - - 132(H) 136(H) -  ?Triglycerides 0 - 149 mg/dL - - 107 101 -  ?Creatinine 0.44 - 1.00 mg/dL 0.72 0.63 0.66 0.70 -  ? ?BP/Weight 04/09/2021 03/06/2021 02/13/2021 11/13/2020 05/23/2020 05/14/2020 01/15/2020  ?Systolic BP 503 888 280 034 159 136 -  ?Diastolic BP 88 78 80 82 84 80 -  ?Wt. (Lbs) 284 280.65 284 281 - 286 293  ?BMI 48.75 48.17 48.75 48.23 - 49.09 50.29  ? ?Foot/eye exam completion dates Latest Ref Rng & Units 07/02/2020  ?Eye Exam No Retinopathy No Retinopathy  ?Foot Form Completion - -  ? ? ? ?

## 2021-04-14 NOTE — Assessment & Plan Note (Signed)
?  Patient re-educated about  the importance of commitment to a  minimum of 150 minutes of exercise per week as able. ? ?The importance of healthy food choices with portion control discussed, as well as eating regularly and within a 12 hour window most days. ?The need to choose "clean , green" food 50 to 75% of the time is discussed, as well as to make water the primary drink and set a goal of 64 ounces water daily. ? ?  ?Weight /BMI 04/09/2021 03/06/2021 02/13/2021  ?WEIGHT 284 lb 280 lb 10.3 oz 284 lb  ?HEIGHT '5\' 4"'$  '5\' 4"'$  '5\' 4"'$   ?BMI 48.75 kg/m2 48.17 kg/m2 48.75 kg/m2  ? ? ? ?

## 2021-04-27 ENCOUNTER — Other Ambulatory Visit: Payer: Self-pay | Admitting: Family Medicine

## 2021-05-11 ENCOUNTER — Other Ambulatory Visit: Payer: Self-pay | Admitting: Family Medicine

## 2021-05-22 ENCOUNTER — Ambulatory Visit: Payer: 59 | Admitting: Family Medicine

## 2021-05-29 ENCOUNTER — Ambulatory Visit: Payer: 59 | Admitting: Podiatrist

## 2021-05-29 ENCOUNTER — Encounter: Payer: Self-pay | Admitting: Podiatrist

## 2021-05-29 DIAGNOSIS — B070 Plantar wart: Secondary | ICD-10-CM | POA: Diagnosis not present

## 2021-05-29 DIAGNOSIS — L600 Ingrowing nail: Secondary | ICD-10-CM | POA: Diagnosis not present

## 2021-05-29 NOTE — Progress Notes (Signed)
?  ? ?HPI: Patient is 61 y.o. female who presents today for a painful plantar wart on the lateral aspect of the left foot.  She states that she has had it for a while now and it becomes painful and symptomatic.  She also relates the great toenails have become painful and ingrown.  She needs her nails trimmed. ? ?Patient Active Problem List  ? Diagnosis Date Noted  ? Right foot pain 11/13/2020  ? Essential hypertension 11/13/2020  ? Osteoarthritis of right knee 01/15/2020  ? Vitamin D deficiency 11/26/2019  ? Hx of adenomatous colonic polyps 06/14/2019  ? Hemorrhoid 06/14/2019  ? History of colonic polyps 09/05/2018  ? Family history of colon cancer 09/05/2018  ? Breast cancer, left breast ([place]) 08/16/2015  ? Sciatica of right side associated with disorder of lumbar spine 08/27/2014  ? Allergic rhinitis 07/03/2014  ? Vertigo, intermittent 05/20/2014  ? Nonspecific elevation of levels of transaminase or lactic acid dehydrogenase (LDH) 05/09/2014  ? Rotator cuff syndrome 09/19/2013  ? Metabolic syndrome X 86/76/7209  ? Anemia 02/18/2012  ? Obesity, morbid, BMI 40.0-49.9 ([employer] [facility]) 02/18/2012  ? OA (osteoarthritis) of knee 12/29/2011  ? Chronic pain of right knee 12/14/2011  ? BRCA1 positive 07/01/2011  ? Invasive ductal carcinoma of right breast, stage 1 02/18/2011  ? Prediabetes 01/22/2011  ? Headache 01/01/2010  ? Thyroid nodule 05/17/2009  ? Hyperlipidemia LDL goal <100 02/22/2007  ? ? ?Current Outpatient Medications on File Prior to Visit  ?Medication Sig Dispense Refill  ? Ascorbic Acid (VITAMIN C) 500 MG CAPS Take 500 mg by mouth daily.     ? Biotin w/ Vitamins C & E (HAIR/SKIN/NAILS PO) Take 2 tablets by mouth daily.    ? butalbital-acetaminophen-caffeine (FIORICET) 50-325-40 MG tablet Take one tablet by mouth two times daily as needed, for headache (Patient taking differently: Take 1-2 tablets by mouth 2 (two) times daily as needed for headache or migraine.) 20 tablet 0  ? calcium carbonate (OSCAL) 1500 (600 Ca) MG  TABS tablet Take 600 mg of elemental calcium by mouth 2 (two) times daily with a meal.    ? cyclobenzaprine (FLEXERIL) 10 MG tablet Take 1 tablet (10 mg total) by mouth 2 (two) times daily as needed for muscle spasms. 20 tablet 0  ? ELDERBERRY PO Take 1,000 mg by mouth daily.    ? meclizine (ANTIVERT) 25 MG tablet Take 1 tablet (25 mg total) by mouth 3 (three) times daily as needed for dizziness. 30 tablet 2  ? metFORMIN (GLUCOPHAGE) 500 MG tablet TAKE 1 TABLET BY MOUTH EVERY DAY WITH BREAKFAST DOSE DECREASE 90 tablet 3  ? Multiple Vitamin (MULITIVITAMIN WITH MINERALS) TABS Take 1 tablet by mouth daily.    ? olmesartan (BENICAR) 20 MG tablet TAKE 1 TABLET BY MOUTH EVERY DAY 90 tablet 1  ? Vitamin D, Ergocalciferol, (DRISDOL) 1.25 MG (50000 UNIT) CAPS capsule TAKE 1 CAPSULE BY MOUTH ONE TIME PER WEEK 12 capsule 1  ? zinc gluconate 50 MG tablet Take 50 mg by mouth daily.    ? ?No current facility-administered medications on file prior to visit.  ? ? ?Allergies  ?Allergen Reactions  ? Nsaids Anaphylaxis  ?  Ibuprofen required hospitalization in 2016 due to ibuprofen  ? Ibuprofen Itching  ? ? ?Review of Systems ?No fevers, chills, nausea, muscle aches, no difficulty breathing, no calf pain, no chest pain or shortness of breath. ? ? ?Physical Exam ? ?GENERAL APPEARANCE: Alert, conversant. Appropriately groomed. No acute distress.  ? ?VASCULAR:  Pedal pulses palpable DP and PT bilateral.  Capillary refill time is immediate to all digits,  Proximal to distal cooling it warm to warm.  Digital perfusion adequate.  ? ?NEUROLOGIC: sensation is intact to 5.07 monofilament at 5/5 sites bilateral.  Light touch is intact bilateral, vibratory sensation intact bilateral ? ?MUSCULOSKELETAL: acceptable muscle strength, tone and stability bilateral.  No gross boney pedal deformities noted.  No pain, crepitus or limitation noted with foot and ankle range of motion bilateral.  ? ?DERMATOLOGIC: skin is warm, supple, and dry.  Color,  texture, and turgor of skin within normal limits.  Circumscribed lesion is present plantar lateral aspect of the left foot.  Plantar verruca suspected.  Patient's bilateral hallux nails are incurvated and painful.  Remainder of the nails are asymptomatic. ? ? ? ?Assessment  ? ?  ICD-10-CM   ?1. Plantar wart, left foot  B07.0   ?  ?2. Ingrown toenail of both feet  L60.0   ?  ? ? ? ?Plan ? ?Discussed treatment options and alternatives.  I pared the soft tissue lesion of the left foot and applied Salinocaine.  I did a slant back procedure on the medial lateral borders of bilateral hallux nails without complication.  Should this fail to relieve her pain a more permanent procedure could be performed in the future.  She will consider this as needed.  She will call if any problems or concerns arise and will be seen back for recheck as needed. ?

## 2021-05-29 NOTE — Patient Instructions (Signed)
Try epsom salt soaks if your great toenails continue causing pain-- soak in epsom salts and apply polysporin or neosporin along the edges of the nail.  .  We can also permanently remove the painful corners if trimming them back does not help with pain ? ?Ingrown Toenail ? ?An ingrown toenail occurs when the corner or sides of a toenail grow into the surrounding skin. This causes discomfort and pain. The big toe is most commonly affected, but any of the toes can be affected. If an ingrown toenail is not treated, it can become infected. ?What are the causes? ?This condition may be caused by: ?Wearing shoes that are too small or tight. ?An injury, such as stubbing your toe or having your toe stepped on. ?Improper cutting or care of your toenails. ?Having nail or foot abnormalities that were present from birth (congenital abnormalities), such as having a nail that is too big for your toe. ?What increases the risk? ?The following factors may make you more likely to develop ingrown toenails: ?Age. Nails tend to get thicker with age, so ingrown nails are more common among older people. ?Cutting your toenails incorrectly, such as cutting them very short or cutting them unevenly. ?An ingrown toenail is more likely to get infected if you have: ?Diabetes. ?Blood flow (circulation) problems. ?What are the signs or symptoms? ?Symptoms of an ingrown toenail may include: ?Pain, soreness, or tenderness. ?Redness. ?Swelling. ?Hardening of the skin that surrounds the toenail. ?Signs that an ingrown toenail may be infected include: ?Fluid or pus. ?Symptoms that get worse. ?How is this diagnosed? ?Ingrown toenails may be diagnosed based on: ?Your symptoms and medical history. ?A physical exam. ?Labs or tests. If you have fluid or blood coming from your toenail, a sample may be collected to test for the specific type of bacteria that is causing the infection. ?How is this treated? ?Treatment depends on the severity of your symptoms. You  may be able to care for your toenail at home. ?If you have an infection, you may be prescribed antibiotic medicines. ?If you have fluid or pus draining from your toenail, your health care provider may drain it. ?If you have trouble walking, you may be given crutches to use. ?If you have a severe or infected ingrown toenail, you may need a procedure to remove part or all of the nail. ?Follow these instructions at home: ?Foot care ? ?Check your wound every day for signs of infection, or as often as told by your health care provider. Check for: ?More redness, swelling, or pain. ?More fluid or blood. ?Warmth. ?Pus or a bad smell. ?Do not pick at your toenail or try to remove it yourself. ?Soak your foot in warm, soapy water. Do this for 20 minutes, 3 times a day, or as often as told by your health care provider. This helps to keep your toe clean and your skin soft. ?Wear shoes that fit well and are not too tight. Your health care provider may recommend that you wear open-toed shoes while you heal. ?Trim your toenails regularly and carefully. Cut your toenails straight across to prevent injury to the skin at the corners of the toenail. Do not cut your nails in a curved shape. ?Keep your feet clean and dry to help prevent infection. ?General instructions ?Take over-the-counter and prescription medicines only as told by your health care provider. ?If you were prescribed an antibiotic, take it as told by your health care provider. Do not stop taking the antibiotic even if  you start to feel better. ?If your health care provider told you to use crutches to help you move around, use them as instructed. ?Return to your normal activities as told by your health care provider. Ask your health care provider what activities are safe for you. ?Keep all follow-up visits. This is important. ?Contact a health care provider if: ?You have more redness, swelling, pain, or other symptoms that do not improve with treatment. ?You have fluid,  blood, or pus coming from your toenail. ?You have a red streak on your skin that starts at your foot and spreads up your leg. ?You have a fever. ?Summary ?An ingrown toenail occurs when the corner or sides of a toenail grow into the surrounding skin. This causes discomfort and pain. The big toe is most commonly affected, but any of the toes can be affected. ?If an ingrown toenail is not treated, it can become infected. ?Fluid or pus draining from your toenail is a sign of infection. Your health care provider may need to drain it. You may be given antibiotics to treat the infection. ?Trimming your toenails regularly and properly can help you prevent an ingrown toenail. ?This information is not intended to replace advice given to you by your health care provider. Make sure you discuss any questions you have with your health care provider. ?Document Revised: 05/21/2020 Document Reviewed: 05/21/2020 ?Elsevier Patient Education ? [place]. ? ?

## 2021-06-10 ENCOUNTER — Encounter: Payer: Self-pay | Admitting: Family Medicine

## 2021-06-10 ENCOUNTER — Ambulatory Visit: Payer: 59 | Admitting: Family Medicine

## 2021-06-10 VITALS — BP 126/79 | HR 64 | Ht 64.0 in | Wt 278.0 lb

## 2021-06-10 DIAGNOSIS — G4489 Other headache syndrome: Secondary | ICD-10-CM

## 2021-06-10 DIAGNOSIS — E785 Hyperlipidemia, unspecified: Secondary | ICD-10-CM | POA: Diagnosis not present

## 2021-06-10 DIAGNOSIS — E559 Vitamin D deficiency, unspecified: Secondary | ICD-10-CM

## 2021-06-10 DIAGNOSIS — D509 Iron deficiency anemia, unspecified: Secondary | ICD-10-CM

## 2021-06-10 DIAGNOSIS — I10 Essential (primary) hypertension: Secondary | ICD-10-CM | POA: Diagnosis not present

## 2021-06-10 DIAGNOSIS — R7303 Prediabetes: Secondary | ICD-10-CM | POA: Diagnosis not present

## 2021-06-10 MED ORDER — PHENTERMINE HCL 37.5 MG PO TABS: ORAL_TABLET | ORAL | 3 refills | Status: DC

## 2021-06-10 NOTE — Assessment & Plan Note (Signed)
Controlled, no change in medication ?DASH diet and commitment to daily physical activity for a minimum of 30 minutes discussed and encouraged, as a part of hypertension management. ?The importance of attaining a healthy weight is also discussed. ? ? ?  06/10/2021  ?  8:02 AM 04/09/2021  ?  1:04 PM 03/06/2021  ?  8:17 AM 02/13/2021  ?  2:23 PM 02/13/2021  ?  1:38 PM 11/13/2020  ?  9:03 AM 05/23/2020  ?  1:02 PM  ?BP/Weight  ?Systolic BP 160 109 323 557 151 145 159  ?Diastolic BP 79 88 78 80 76 82 84  ?Wt. (Lbs) 278 284 280.65  284 281   ?BMI 47.72 kg/m2 48.75 kg/m2 48.17 kg/m2  48.75 kg/m2 48.23 kg/m2   ? ? ? ? ?

## 2021-06-10 NOTE — Assessment & Plan Note (Signed)
Patient educated about the importance of limiting  Carbohydrate intake , the need to commit to daily physical activity for a minimum of 30 minutes , and to commit weight loss. ?The fact that changes in all these areas will reduce or eliminate all together the development of diabetes is stressed.  ?Updated lab needed at/ before next visit. ? ? ? ?  Latest Ref Rng & Units 02/27/2021  ?  8:10 AM 12/23/2020  ?  9:51 AM 11/06/2020  ?  8:24 AM 05/07/2020  ?  8:40 AM 01/15/2020  ? 10:20 AM  ?Diabetic Labs  ?HbA1c 4.8 - 5.6 %   6.0   6.1   6.2    ?Chol 100 - 199 mg/dL   202   211     ?HDL >39 mg/dL   51   57     ?Calc LDL 0 - 99 mg/dL   132   136     ?Triglycerides 0 - 149 mg/dL   107   101     ?Creatinine 0.44 - 1.00 mg/dL 0.72   0.63   0.66   0.70     ? ? ?  06/10/2021  ?  8:02 AM 04/09/2021  ?  1:04 PM 03/06/2021  ?  8:17 AM 02/13/2021  ?  2:23 PM 02/13/2021  ?  1:38 PM 11/13/2020  ?  9:03 AM 05/23/2020  ?  1:02 PM  ?BP/Weight  ?Systolic BP 349 179 150 569 151 145 159  ?Diastolic BP 79 88 78 80 76 82 84  ?Wt. (Lbs) 278 284 280.65  284 281   ?BMI 47.72 kg/m2 48.75 kg/m2 48.17 kg/m2  48.75 kg/m2 48.23 kg/m2   ? ? ?  Latest Ref Rng & Units 07/02/2020  ? 12:00 AM  ?Foot/eye exam completion dates  ?Eye Exam No Retinopathy No Retinopathy       ?  ? This result is from an external source.  ? ? ? ?

## 2021-06-10 NOTE — Assessment & Plan Note (Signed)
Updated lab needed at/ before next visit.   

## 2021-06-10 NOTE — Assessment & Plan Note (Signed)
Controlled, no change in medication  

## 2021-06-10 NOTE — Assessment & Plan Note (Signed)
Improved, start half phentermine  Daily, and continue to work on lifestyle modification ? ?Patient re-educated about  the importance of commitment to a  minimum of 150 minutes of exercise per week as able. ? ?The importance of healthy food choices with portion control discussed, as well as eating regularly and within a 12 hour window most days. ?The need to choose "clean , green" food 50 to 75% of the time is discussed, as well as to make water the primary drink and set a goal of 64 ounces water daily. ? ?  ? ?  06/10/2021  ?  8:02 AM 04/09/2021  ?  1:04 PM 03/06/2021  ?  8:17 AM  ?Weight /BMI  ?Weight 278 lb 284 lb 280 lb 10.3 oz  ?Height '5\' 4"'$  (1.626 m) '5\' 4"'$  (1.626 m) '5\' 4"'$  (1.626 m)  ?BMI 47.72 kg/m2 48.75 kg/m2 48.17 kg/m2  ? ? ? ?

## 2021-06-10 NOTE — Assessment & Plan Note (Signed)
Hyperlipidemia:Low fat diet discussed and encouraged. ? ? ?Lipid Panel  ?Lab Results  ?Component Value Date  ? CHOL 202 (H) 11/06/2020  ? HDL 51 11/06/2020  ? LDLCALC 132 (H) 11/06/2020  ? TRIG 107 11/06/2020  ? CHOLHDL 4.0 11/06/2020  ? ? ? ?Updated lab needed at/ before next visit. ? ?

## 2021-06-10 NOTE — Progress Notes (Signed)
? Kathleen Reynolds     MRN: [id number]      DOB: [birth year] ? ? ?HPI ?Ms. Pacifico is here for follow up and re-evaluation of chronic medical conditions, medication management and review of any available recent lab and radiology data.  ?Preventive health is updated, specifically  Cancer screening and Immunization.   ?Questions or concerns regarding consultations or procedures which the PT has had in the interim are  addressed. ?The PT denies any adverse reactions to current medications since the last visit.  ?There are no new concerns.  ?There are no specific complaints  ? ?ROS ?Denies recent fever or chills. ?Denies sinus pressure, nasal congestion, ear pain or sore throat. ?Denies chest congestion, productive cough or wheezing. ?Denies chest pains, palpitations and leg swelling ?Denies abdominal pain, nausea, vomiting,diarrhea or constipation.   ?Denies dysuria, frequency, hesitancy or incontinence. ?Denies joint pain, swelling and limitation in mobility. ?Denies headaches, seizures, numbness, or tingling. ?Denies depression, anxiety or insomnia. ?Denies skin break down or rash. ? ? ?PE ? ?BP 126/79   Pulse 64   Ht '5\' 4"'$  (1.626 m)   Wt 278 lb (126.1 kg)   LMP 03/27/2011   SpO2 93%   BMI 47.72 kg/m?  ? ?Patient alert and oriented and in no cardiopulmonary distress. ? ?HEENT: No facial asymmetry, EOMI,     Neck supple . ? ?Chest: Clear to auscultation bilaterally. ? ?CVS: S1, S2 no murmurs, no S3.Regular rate. ? ?ABD: Soft non tender.  ? ?Ext: No edema ? ?MS: Adequate ROM spine, shoulders, hips and knees. ? ?Skin: Intact, no ulcerations or rash noted. ? ?Psych: Good eye contact, normal affect. Memory intact not anxious or depressed appearing. ? ?CNS: CN 2-12 intact, power,  normal throughout.no focal deficits noted. ? ? ?Assessment & Plan ?Essential hypertension ?Controlled, no change in medication ?DASH diet and commitment to daily physical activity for a minimum of 30 minutes discussed and encouraged, as  a part of hypertension management. ?The importance of attaining a healthy weight is also discussed. ? ? ?  06/10/2021  ?  8:02 AM 04/09/2021  ?  1:04 PM 03/06/2021  ?  8:17 AM 02/13/2021  ?  2:23 PM 02/13/2021  ?  1:38 PM 11/13/2020  ?  9:03 AM 05/23/2020  ?  1:02 PM  ?BP/Weight  ?Systolic BP 295 188 416 606 151 145 159  ?Diastolic BP 79 88 78 80 76 82 84  ?Wt. (Lbs) 278 284 280.65  284 281   ?BMI 47.72 kg/m2 48.75 kg/m2 48.17 kg/m2  48.75 kg/m2 48.23 kg/m2   ? ? ? ? ? ?Obesity, morbid, BMI 40.0-49.9 ([place]) ?Improved, start half phentermine  Daily, and continue to work on lifestyle modification ? ?Patient re-educated about  the importance of commitment to a  minimum of 150 minutes of exercise per week as able. ? ?The importance of healthy food choices with portion control discussed, as well as eating regularly and within a 12 hour window most days. ?The need to choose "clean , green" food 50 to 75% of the time is discussed, as well as to make water the primary drink and set a goal of 64 ounces water daily. ? ?  ? ?  06/10/2021  ?  8:02 AM 04/09/2021  ?  1:04 PM 03/06/2021  ?  8:17 AM  ?Weight /BMI  ?Weight 278 lb 284 lb 280 lb 10.3 oz  ?Height '5\' 4"'$  (1.626 m) '5\' 4"'$  (1.626 m) '5\' 4"'$  (1.626 m)  ?BMI 47.72 kg/m2 48.75 kg/m2  48.17 kg/m2  ? ? ? ? ?Hyperlipidemia LDL goal <100 ?Hyperlipidemia:Low fat diet discussed and encouraged. ? ? ?Lipid Panel  ?Lab Results  ?Component Value Date  ? CHOL 202 (H) 11/06/2020  ? HDL 51 11/06/2020  ? LDLCALC 132 (H) 11/06/2020  ? TRIG 107 11/06/2020  ? CHOLHDL 4.0 11/06/2020  ? ? ? ?Updated lab needed at/ before next visit. ? ? ?Prediabetes ?Patient educated about the importance of limiting  Carbohydrate intake , the need to commit to daily physical activity for a minimum of 30 minutes , and to commit weight loss. ?The fact that changes in all these areas will reduce or eliminate all together the development of diabetes is stressed.  ?Updated lab needed at/ before next visit. ? ? ? ?  Latest Ref Rng &  Units 02/27/2021  ?  8:10 AM 12/23/2020  ?  9:51 AM 11/06/2020  ?  8:24 AM 05/07/2020  ?  8:40 AM 01/15/2020  ? 10:20 AM  ?Diabetic Labs  ?HbA1c 4.8 - 5.6 %   6.0   6.1   6.2    ?Chol 100 - 199 mg/dL   202   211     ?HDL >39 mg/dL   51   57     ?Calc LDL 0 - 99 mg/dL   132   136     ?Triglycerides 0 - 149 mg/dL   107   101     ?Creatinine 0.44 - 1.00 mg/dL 0.72   0.63   0.66   0.70     ? ? ?  06/10/2021  ?  8:02 AM 04/09/2021  ?  1:04 PM 03/06/2021  ?  8:17 AM 02/13/2021  ?  2:23 PM 02/13/2021  ?  1:38 PM 11/13/2020  ?  9:03 AM 05/23/2020  ?  1:02 PM  ?BP/Weight  ?Systolic BP 505 397 673 419 151 145 159  ?Diastolic BP 79 88 78 80 76 82 84  ?Wt. (Lbs) 278 284 280.65  284 281   ?BMI 47.72 kg/m2 48.75 kg/m2 48.17 kg/m2  48.75 kg/m2 48.23 kg/m2   ? ? ?  Latest Ref Rng & Units 07/02/2020  ? 12:00 AM  ?Foot/eye exam completion dates  ?Eye Exam No Retinopathy No Retinopathy       ?  ? This result is from an external source.  ? ? ? ? ?Headache ?Controlled, no change in medication ? ? ?Vitamin D deficiency ?Updated lab needed at/ before next visit. ? ? ? ?

## 2021-06-10 NOTE — Patient Instructions (Addendum)
F/U in 4 months, call if you need me before ? ?CONGRATS on improved eating habits and plan to start execise ? ?New is phentermine half every morning ? ?It is important that you exercise regularly at least 30 minutes 5 times a week. If you develop chest pain, have severe difficulty breathing, or feel very tired, stop exercising immediately and seek medical attention  ? ? ?Weight loss goal of 10 pounds ? ?Labs today lipid, cmp and EGFR, TSH and vit D and hBA1C ? ?Thanks for choosing [facility], we consider it a privelige to serve you. ? ?

## 2021-06-14 LAB — CMP14+EGFR
ALT: 20 IU/L (ref 0–32)
AST: 19 IU/L (ref 0–40)
Albumin/Globulin Ratio: 1.8 (ref 1.2–2.2)
Albumin: 4.5 g/dL (ref 3.8–4.9)
Alkaline Phosphatase: 74 IU/L (ref 44–121)
BUN/Creatinine Ratio: 20 (ref 12–28)
BUN: 14 mg/dL (ref 8–27)
Bilirubin Total: 0.3 mg/dL (ref 0.0–1.2)
CO2: 21 mmol/L (ref 20–29)
Calcium: 9.3 mg/dL (ref 8.7–10.3)
Chloride: 104 mmol/L (ref 96–106)
Creatinine, Ser: 0.71 mg/dL (ref 0.57–1.00)
Globulin, Total: 2.5 g/dL (ref 1.5–4.5)
Glucose: 95 mg/dL (ref 70–99)
Potassium: 4.2 mmol/L (ref 3.5–5.2)
Sodium: 139 mmol/L (ref 134–144)
Total Protein: 7 g/dL (ref 6.0–8.5)
eGFR: 97 mL/min/1.73

## 2021-06-14 LAB — LIPID PANEL
Chol/HDL Ratio: 3.2 ratio (ref 0.0–4.4)
Cholesterol, Total: 190 mg/dL (ref 100–199)
HDL: 59 mg/dL
LDL Chol Calc (NIH): 112 mg/dL — ABNORMAL HIGH (ref 0–99)
Triglycerides: 108 mg/dL (ref 0–149)
VLDL Cholesterol Cal: 19 mg/dL (ref 5–40)

## 2021-06-14 LAB — VITAMIN D 25 HYDROXY (VIT D DEFICIENCY, FRACTURES): Vit D, 25-Hydroxy: 49.7 ng/mL (ref 30.0–100.0)

## 2021-06-14 LAB — HEMOGLOBIN A1C
Est. average glucose Bld gHb Est-mCnc: 128 mg/dL
Hgb A1c MFr Bld: 6.1 % — ABNORMAL HIGH (ref 4.8–5.6)

## 2021-06-14 LAB — TSH: TSH: 2.46 u[IU]/mL (ref 0.450–4.500)

## 2021-08-12 ENCOUNTER — Ambulatory Visit: Payer: 59 | Admitting: Family Medicine

## 2021-08-12 ENCOUNTER — Encounter: Payer: Self-pay | Admitting: Family Medicine

## 2021-08-12 VITALS — BP 123/73 | HR 61 | Ht 64.0 in | Wt 274.1 lb

## 2021-08-12 DIAGNOSIS — H66002 Acute suppurative otitis media without spontaneous rupture of ear drum, left ear: Secondary | ICD-10-CM

## 2021-08-12 DIAGNOSIS — I10 Essential (primary) hypertension: Secondary | ICD-10-CM

## 2021-08-12 DIAGNOSIS — G4489 Other headache syndrome: Secondary | ICD-10-CM

## 2021-08-12 DIAGNOSIS — H6692 Otitis media, unspecified, left ear: Secondary | ICD-10-CM | POA: Insufficient documentation

## 2021-08-12 DIAGNOSIS — E785 Hyperlipidemia, unspecified: Secondary | ICD-10-CM

## 2021-08-12 DIAGNOSIS — R7303 Prediabetes: Secondary | ICD-10-CM

## 2021-08-12 MED ORDER — PHENTERMINE HCL 37.5 MG PO TABS: ORAL_TABLET | ORAL | 1 refills | Status: DC

## 2021-08-12 MED ORDER — FLUTICASONE PROPIONATE 50 MCG/ACT NA SUSP: 2.0000 | Freq: Every day | NASAL | 2 refills | Status: DC

## 2021-08-12 MED ORDER — SULFAMETHOXAZOLE-TRIMETHOPRIM 800-160 MG PO TABS: 1.0000 | ORAL_TABLET | Freq: Two times a day (BID) | ORAL | 0 refills | Status: DC

## 2021-08-12 NOTE — Patient Instructions (Addendum)
Annual exam and reeval weight in 14 weeks, call if you need me sooner  You are treated for left ear infection, take septra , and antibiotic as prescribed ,call back if persistent discomfort and muffling persist or return in next 3 to 4 weeks, I will refer you to ENT for evaluation  Continue phentermine HALF daily  Fasting lipid, cmp and EG and HBA1C 5 days before next visit  Congrats on weight loss ( Please stop sweet tea and potatoes! (sweet potato is oK), increase green red and orange vegetables, commit to 64 oz water daily '  Thanks for choosing [facility], we consider it a privelige to serve you.

## 2021-08-18 ENCOUNTER — Encounter: Payer: Self-pay | Admitting: Family Medicine

## 2021-08-18 NOTE — Assessment & Plan Note (Signed)
Septra prescribed 

## 2021-08-18 NOTE — Assessment & Plan Note (Signed)
DASH diet and commitment to daily physical activity for a minimum of 30 minutes discussed and encouraged, as a part of hypertension management. The importance of attaining a healthy weight is also discussed.     08/12/2021   10:23 AM 06/10/2021    8:02 AM 04/09/2021    1:04 PM 03/06/2021    8:17 AM 02/13/2021    2:23 PM 02/13/2021    1:38 PM 11/13/2020    9:03 AM  BP/Weight  Systolic BP 098 119 147 829 562 130 865  Diastolic BP 73 79 88 78 80 76 82  Wt. (Lbs) 274.12 278 284 280.65  284 281  BMI 47.05 kg/m2 47.72 kg/m2 48.75 kg/m2 48.17 kg/m2  48.75 kg/m2 48.23 kg/m2   Controlled, no change in medication

## 2021-08-18 NOTE — Assessment & Plan Note (Signed)
  Patient re-educated about  the importance of commitment to a  minimum of 150 minutes of exercise per week as able.  The importance of healthy food choices with portion control discussed, as well as eating regularly and within a 12 hour window most days. The need to choose "clean , green" food 50 to 75% of the time is discussed, as well as to make water the primary drink and set a goal of 64 ounces water daily.       08/12/2021   10:23 AM 06/10/2021    8:02 AM 04/09/2021    1:04 PM  Weight /BMI  Weight 274 lb 1.9 oz 278 lb 284 lb  Height '5\' 4"'$  (1.626 m) '5\' 4"'$  (1.626 m) '5\' 4"'$  (1.626 m)  BMI 47.05 kg/m2 47.72 kg/m2 48.75 kg/m2    Improving continue med and lifestyle change

## 2021-08-18 NOTE — Assessment & Plan Note (Signed)
Patient educated about the importance of limiting  Carbohydrate intake , the need to commit to daily physical activity for a minimum of 30 minutes , and to commit weight loss. The fact that changes in all these areas will reduce or eliminate all together the development of diabetes is stressed.  Deteriorated, continue metformin     Latest Ref Rng & Units 06/13/2021    8:34 AM 02/27/2021    8:10 AM 12/23/2020    9:51 AM 11/06/2020    8:24 AM 05/07/2020    8:40 AM  Diabetic Labs  HbA1c 4.8 - 5.6 % 6.1    6.0  6.1   Chol 100 - 199 mg/dL 190    202  211   HDL >39 mg/dL 59    51  57   Calc LDL 0 - 99 mg/dL 112    132  136   Triglycerides 0 - 149 mg/dL 108    107  101   Creatinine 0.57 - 1.00 mg/dL 0.71  0.72  0.63  0.66  0.70       08/12/2021   10:23 AM 06/10/2021    8:02 AM 04/09/2021    1:04 PM 03/06/2021    8:17 AM 02/13/2021    2:23 PM 02/13/2021    1:38 PM 11/13/2020    9:03 AM  BP/Weight  Systolic BP 161 096 045 409 811 914 782  Diastolic BP 73 79 88 78 80 76 82  Wt. (Lbs) 274.12 278 284 280.65  284 281  BMI 47.05 kg/m2 47.72 kg/m2 48.75 kg/m2 48.17 kg/m2  48.75 kg/m2 48.23 kg/m2      Latest Ref Rng & Units 07/02/2020   12:00 AM  Foot/eye exam completion dates  Eye Exam No Retinopathy No Retinopathy         This result is from an external source.

## 2021-08-18 NOTE — Progress Notes (Signed)
Kathleen Reynolds     MRN: [id number]      DOB: Sep 30, 1960   HPI Kathleen Reynolds is here fc/o left ear pain x several weeks and muffled hearing. No fever or chills, no sinus presure or drainage Watching food intake and wants to exercise but knee pain limits, working on weight loss ROS Denies recent fever or chills. . Denies chest congestion, productive cough or wheezing. Denies chest pains, palpitations and leg swelling Denies abdominal pain, nausea, vomiting,diarrhea or constipation.   Denies dysuria, frequency, hesitancy or incontinence.  Denies headaches, seizures, numbness, or tingling. Denies depression, anxiety or insomnia. Denies skin break down or rash.   PE  BP 123/73   Pulse 61   Ht '5\' 4"'$  (1.626 m)   Wt 274 lb 1.9 oz (124.3 kg)   LMP 03/27/2011   SpO2 94%   BMI 47.05 kg/m   Patient alert and oriented and in no cardiopulmonary distress.  HEENT: No facial asymmetry, EOMI,     Neck supple .Left TM dull and erythematous  Chest: Clear to auscultation bilaterally.  CVS: S1, S2 no murmurs, no S3.Regular rate.  ABD: Soft non tender.   Ext: No edema  MS: decreased  ROM spine,  hips and knees.  Skin: Intact, no ulcerations or rash noted.  Psych: Good eye contact, normal affect. Memory intact not anxious or depressed appearing.  CNS: CN 2-12 intact, power,  normal throughout.no focal deficits noted.   Assessment & Plan  Left otitis media Septra prescribed  Prediabetes Patient educated about the importance of limiting  Carbohydrate intake , the need to commit to daily physical activity for a minimum of 30 minutes , and to commit weight loss. The fact that changes in all these areas will reduce or eliminate all together the development of diabetes is stressed.  Deteriorated, continue metformin     Latest Ref Rng & Units 06/13/2021    8:34 AM 02/27/2021    8:10 AM 12/23/2020    9:51 AM 11/06/2020    8:24 AM 05/07/2020    8:40 AM  Diabetic Labs  HbA1c 4.8 -  5.6 % 6.1    6.0  6.1   Chol 100 - 199 mg/dL 190    202  211   HDL >39 mg/dL 59    51  57   Calc LDL 0 - 99 mg/dL 112    132  136   Triglycerides 0 - 149 mg/dL 108    107  101   Creatinine 0.57 - 1.00 mg/dL 0.71  0.72  0.63  0.66  0.70       08/12/2021   10:23 AM 06/10/2021    8:02 AM 04/09/2021    1:04 PM 03/06/2021    8:17 AM 02/13/2021    2:23 PM 02/13/2021    1:38 PM 11/13/2020    9:03 AM  BP/Weight  Systolic BP 947 096 283 662 947 654 650  Diastolic BP 73 79 88 78 80 76 82  Wt. (Lbs) 274.12 278 284 280.65  284 281  BMI 47.05 kg/m2 47.72 kg/m2 48.75 kg/m2 48.17 kg/m2  48.75 kg/m2 48.23 kg/m2      Latest Ref Rng & Units 07/02/2020   12:00 AM  Foot/eye exam completion dates  Eye Exam No Retinopathy No Retinopathy         This result is from an external source.      Obesity, morbid, BMI 40.0-49.9 ([place])  Patient re-educated about  the importance of commitment to a  minimum of 150 minutes of exercise per week as able.  The importance of healthy food choices with portion control discussed, as well as eating regularly and within a 12 hour window most days. The need to choose "clean , green" food 50 to 75% of the time is discussed, as well as to make water the primary drink and set a goal of 64 ounces water daily.       08/12/2021   10:23 AM 06/10/2021    8:02 AM 04/09/2021    1:04 PM  Weight /BMI  Weight 274 lb 1.9 oz 278 lb 284 lb  Height '5\' 4"'$  (1.626 m) '5\' 4"'$  (1.626 m) '5\' 4"'$  (1.626 m)  BMI 47.05 kg/m2 47.72 kg/m2 48.75 kg/m2    Improving continue med and lifestyle change  Essential hypertension DASH diet and commitment to daily physical activity for a minimum of 30 minutes discussed and encouraged, as a part of hypertension management. The importance of attaining a healthy weight is also discussed.     08/12/2021   10:23 AM 06/10/2021    8:02 AM 04/09/2021    1:04 PM 03/06/2021    8:17 AM 02/13/2021    2:23 PM 02/13/2021    1:38 PM 11/13/2020    9:03 AM  BP/Weight  Systolic  BP 253 664 403 474 259 563 875  Diastolic BP 73 79 88 78 80 76 82  Wt. (Lbs) 274.12 278 284 280.65  284 281  BMI 47.05 kg/m2 47.72 kg/m2 48.75 kg/m2 48.17 kg/m2  48.75 kg/m2 48.23 kg/m2   Controlled, no change in medication    Headache No recent headache and respond to fioricet

## 2021-08-18 NOTE — Assessment & Plan Note (Signed)
No recent headache and respond to fioricet

## 2021-08-27 ENCOUNTER — Ambulatory Visit: Payer: 59 | Admitting: Podiatry

## 2021-10-14 ENCOUNTER — Ambulatory Visit: Payer: 59 | Admitting: Family Medicine

## 2021-10-16 ENCOUNTER — Other Ambulatory Visit: Payer: Self-pay | Admitting: Family Medicine

## 2021-10-23 ENCOUNTER — Other Ambulatory Visit: Payer: Self-pay | Admitting: Family Medicine

## 2021-11-17 ENCOUNTER — Other Ambulatory Visit: Payer: Self-pay | Admitting: Family Medicine

## 2021-11-18 ENCOUNTER — Other Ambulatory Visit: Payer: Self-pay | Admitting: Family Medicine

## 2021-11-18 NOTE — Telephone Encounter (Signed)
Please advise ? Patient can get losartan for $4 dollars

## 2021-11-18 NOTE — Telephone Encounter (Signed)
LMTRC

## 2021-11-25 ENCOUNTER — Encounter: Payer: Self-pay | Admitting: Family Medicine

## 2021-11-25 ENCOUNTER — Ambulatory Visit (INDEPENDENT_AMBULATORY_CARE_PROVIDER_SITE_OTHER): Payer: 59 | Admitting: Family Medicine

## 2021-11-25 VITALS — BP 128/72 | HR 65 | Ht 64.0 in | Wt 274.1 lb

## 2021-11-25 DIAGNOSIS — Z0001 Encounter for general adult medical examination with abnormal findings: Secondary | ICD-10-CM

## 2021-11-25 DIAGNOSIS — R0683 Snoring: Secondary | ICD-10-CM | POA: Diagnosis not present

## 2021-11-25 DIAGNOSIS — G8929 Other chronic pain: Secondary | ICD-10-CM

## 2021-11-25 DIAGNOSIS — Z23 Encounter for immunization: Secondary | ICD-10-CM | POA: Diagnosis not present

## 2021-11-25 DIAGNOSIS — M25561 Pain in right knee: Secondary | ICD-10-CM

## 2021-11-25 DIAGNOSIS — M1711 Unilateral primary osteoarthritis, right knee: Secondary | ICD-10-CM

## 2021-11-25 MED ORDER — METFORMIN HCL 500 MG PO TABS: 500.0000 mg | ORAL_TABLET | Freq: Two times a day (BID) | ORAL | 3 refills | Status: DC

## 2021-11-25 NOTE — Assessment & Plan Note (Signed)

## 2021-11-25 NOTE — Progress Notes (Signed)
    Kathleen Reynolds     MRN: [id number]      DOB: [birth year]  HPI: Patient is in for annual physical exam. C/o acute knee pain thinks she injured an already arthritic knee running after  her grandchild C/o depression, anxiety , streess, only child critically ill for months, just received a heart transplant. C/o excessive snoring and  daytime sleepiness, and chronic fatigue Immunization is reviewed , and  updated if needed.   PE: BP 128/72   Pulse 65   Ht '5\' 4"'$  (1.626 m)   Wt 274 lb 1.3 oz (124.3 kg)   LMP 03/27/2011   SpO2 91%   BMI 47.05 kg/m   Pleasant  female, alert and oriented x 3, in no cardio-pulmonary distress. Afebrile. HEENT No facial trauma or asymetry. Sinuses non tender.  Extra occullar muscles intact.. External ears normal, . Neck: supple, no adenopathy,JVD or thyromegaly.No bruits.  Chest: Clear to ascultation bilaterally.No crackles or wheezes. Non tender to palpation   Cardiovascular system; Heart sounds normal,  S1 and  S2 ,no S3.  No murmur, or thrill.  Peripheral pulses normal.  Abdomen: Soft, non tender,      Musculoskeletal exam: Full ROM of spine, hips , shoulders and reduced in knees.  deformity ,swelling and crepitus noted.noted in right knee No muscle wasting or atrophy.   Neurologic: Cranial nerves 2 to 12 intact. Power, tone ,sensation and reflexes normal throughout. disturbance in gait. No tremor.  Skin: Intact, no ulceration, erythema , scaling or rash noted. Pigmentation normal throughout  Psych; Slightly anxious mood and affect. Judgement and concentration normal   Assessment & Plan:  Osteoarthritis of right knee Current flare, feels as though she recently injured same refer Ortho, for 3 weeks  Snoring Excess daytime sleepiness, needs sleep study  Annual visit for general adult medical examination with abnormal findings Annual exam as documented. Counseling done  re healthy lifestyle involving commitment to 150  minutes exercise per week, heart healthy diet, and attaining healthy weight.The importance of adequate sleep also discussed. Regular seat belt use and home safety, is also discussed. Changes in health habits are decided on by the patient with goals and time frames  set for achieving them. Immunization and cancer screening needs are specifically addressed at this visit.   Chronic pain of right knee Refer to Ortho eval and manage right knee pain  Obesity, morbid, BMI 40.0-49.9 ([place])  Patient re-educated about  the importance of commitment to a  minimum of 150 minutes of exercise per week as able.  The importance of healthy food choices with portion control discussed, as well as eating regularly and within a 12 hour window most days. The need to choose "clean , green" food 50 to 75% of the time is discussed, as well as to make water the primary drink and set a goal of 64 ounces water daily.       11/25/2021    9:10 AM 08/12/2021   10:23 AM 06/10/2021    8:02 AM  Weight /BMI  Weight 274 lb 1.3 oz 274 lb 1.9 oz 278 lb  Height '5\' 4"'$  (1.626 m) '5\' 4"'$  (1.626 m) '5\' 4"'$  (1.626 m)  BMI 47.05 kg/m2 47.05 kg/m2 47.72 kg/m2

## 2021-11-25 NOTE — Assessment & Plan Note (Signed)
Refer to Ortho eval and manage right knee pain

## 2021-11-25 NOTE — Assessment & Plan Note (Signed)
Current flare, feels as though she recently injured same refer Ortho, for 3 weeks

## 2021-11-25 NOTE — Patient Instructions (Signed)
Follow-up in 4 months call if you need me sooner.  Flu vaccine in office today.  Labs already ordered today  Keep  mammogram appt scheduled please  Please get your COVID-vaccine in the next 1 to 2 weeks. You are referred for sleep study, important you get this done, as untreated sleep apnea leads to increased risk of heart ,lung and brain disease.  You are referred to orthopedics regarding right knee appointment is requested in the next 2 to 3 weeks.  Excellent blood pressure control.  Continue to work on change in food choices to primarily plant-based foods and vegetable and eating over a 12-hour period daily example 7 am to 7 pm    Thankful that you will be receiving your son back home today following prolonged hospitalization and wishing you all the best

## 2021-11-25 NOTE — Assessment & Plan Note (Signed)
Excess daytime sleepiness, needs sleep study

## 2021-11-27 DIAGNOSIS — R7303 Prediabetes: Secondary | ICD-10-CM | POA: Diagnosis not present

## 2021-11-27 DIAGNOSIS — E785 Hyperlipidemia, unspecified: Secondary | ICD-10-CM | POA: Diagnosis not present

## 2021-11-28 LAB — CMP14+EGFR
ALT: 18 IU/L (ref 0–32)
AST: 15 IU/L (ref 0–40)
Albumin/Globulin Ratio: 1.6 (ref 1.2–2.2)
Albumin: 4.2 g/dL (ref 3.9–4.9)
Alkaline Phosphatase: 71 IU/L (ref 44–121)
BUN/Creatinine Ratio: 19 (ref 12–28)
BUN: 14 mg/dL (ref 8–27)
Bilirubin Total: 0.2 mg/dL (ref 0.0–1.2)
CO2: 20 mmol/L (ref 20–29)
Calcium: 9.2 mg/dL (ref 8.7–10.3)
Chloride: 104 mmol/L (ref 96–106)
Creatinine, Ser: 0.72 mg/dL (ref 0.57–1.00)
Globulin, Total: 2.6 g/dL (ref 1.5–4.5)
Glucose: 104 mg/dL — ABNORMAL HIGH (ref 70–99)
Potassium: 3.9 mmol/L (ref 3.5–5.2)
Sodium: 139 mmol/L (ref 134–144)
Total Protein: 6.8 g/dL (ref 6.0–8.5)
eGFR: 95 mL/min/{1.73_m2} (ref >59)

## 2021-11-28 LAB — HEMOGLOBIN A1C
Est. average glucose Bld gHb Est-mCnc: 123 mg/dL
Hgb A1c MFr Bld: 5.9 % — ABNORMAL HIGH (ref 4.8–5.6)

## 2021-11-28 LAB — LIPID PANEL
Chol/HDL Ratio: 4.3 ratio (ref 0.0–4.4)
Cholesterol, Total: 205 mg/dL — ABNORMAL HIGH (ref 100–199)
HDL: 48 mg/dL (ref >39)
LDL Chol Calc (NIH): 134 mg/dL — ABNORMAL HIGH (ref 0–99)
Triglycerides: 127 mg/dL (ref 0–149)
VLDL Cholesterol Cal: 23 mg/dL (ref 5–40)

## 2021-11-30 ENCOUNTER — Encounter: Payer: Self-pay | Admitting: Family Medicine

## 2021-11-30 NOTE — Assessment & Plan Note (Signed)
  Patient re-educated about  the importance of commitment to a  minimum of 150 minutes of exercise per week as able.  The importance of healthy food choices with portion control discussed, as well as eating regularly and within a 12 hour window most days. The need to choose "clean , green" food 50 to 75% of the time is discussed, as well as to make water the primary drink and set a goal of 64 ounces water daily.       11/25/2021    9:10 AM 08/12/2021   10:23 AM 06/10/2021    8:02 AM  Weight /BMI  Weight 274 lb 1.3 oz 274 lb 1.9 oz 278 lb  Height '5\' 4"'$  (1.626 m) '5\' 4"'$  (1.626 m) '5\' 4"'$  (1.626 m)  BMI 47.05 kg/m2 47.05 kg/m2 47.72 kg/m2

## 2021-12-02 ENCOUNTER — Encounter: Payer: Self-pay | Admitting: *Deleted

## 2021-12-11 ENCOUNTER — Ambulatory Visit: Payer: 59 | Admitting: Orthopedic Surgery

## 2021-12-11 ENCOUNTER — Other Ambulatory Visit: Payer: Self-pay | Admitting: Family Medicine

## 2021-12-18 ENCOUNTER — Other Ambulatory Visit: Payer: Self-pay | Admitting: Family Medicine

## 2021-12-29 ENCOUNTER — Ambulatory Visit: Payer: 59 | Admitting: Orthopedic Surgery

## 2021-12-29 NOTE — Progress Notes (Deleted)
There is no height or weight on file to calculate BMI.

## 2022-01-01 ENCOUNTER — Other Ambulatory Visit (HOSPITAL_COMMUNITY): Payer: Self-pay | Admitting: Physician Assistant

## 2022-01-01 DIAGNOSIS — Z1231 Encounter for screening mammogram for malignant neoplasm of breast: Secondary | ICD-10-CM

## 2022-01-07 ENCOUNTER — Inpatient Hospital Stay: Payer: 59 | Attending: Physician Assistant

## 2022-01-07 ENCOUNTER — Ambulatory Visit (HOSPITAL_COMMUNITY): Payer: 59

## 2022-01-07 DIAGNOSIS — C50912 Malignant neoplasm of unspecified site of left female breast: Secondary | ICD-10-CM

## 2022-01-07 DIAGNOSIS — Z853 Personal history of malignant neoplasm of breast: Secondary | ICD-10-CM | POA: Insufficient documentation

## 2022-01-07 DIAGNOSIS — C50911 Malignant neoplasm of unspecified site of right female breast: Secondary | ICD-10-CM

## 2022-01-07 DIAGNOSIS — Z9221 Personal history of antineoplastic chemotherapy: Secondary | ICD-10-CM | POA: Insufficient documentation

## 2022-01-07 DIAGNOSIS — Z9012 Acquired absence of left breast and nipple: Secondary | ICD-10-CM | POA: Diagnosis not present

## 2022-01-07 LAB — COMPREHENSIVE METABOLIC PANEL
ALT: 23 U/L (ref 0–44)
AST: 20 U/L (ref 15–41)
Albumin: 3.9 g/dL (ref 3.5–5.0)
Alkaline Phosphatase: 64 U/L (ref 38–126)
Anion gap: 9 (ref 5–15)
BUN: 13 mg/dL (ref 8–23)
CO2: 24 mmol/L (ref 22–32)
Calcium: 9.4 mg/dL (ref 8.9–10.3)
Chloride: 105 mmol/L (ref 98–111)
Creatinine, Ser: 0.71 mg/dL (ref 0.44–1.00)
GFR, Estimated: >60 mL/min (ref >60)
Glucose, Bld: 95 mg/dL (ref 70–99)
Potassium: 3.4 mmol/L — ABNORMAL LOW (ref 3.5–5.1)
Sodium: 138 mmol/L (ref 135–145)
Total Bilirubin: 0.5 mg/dL (ref 0.3–1.2)
Total Protein: 7.3 g/dL (ref 6.5–8.1)

## 2022-01-07 LAB — CBC WITH DIFFERENTIAL/PLATELET
Abs Immature Granulocytes: 0.01 10*3/uL (ref 0.00–0.07)
Basophils Absolute: 0 10*3/uL (ref 0.0–0.1)
Basophils Relative: 0 %
Eosinophils Absolute: 0.1 10*3/uL (ref 0.0–0.5)
Eosinophils Relative: 1 %
HCT: 39.3 % (ref 36.0–46.0)
Hemoglobin: 13.4 g/dL (ref 12.0–15.0)
Immature Granulocytes: 0 %
Lymphocytes Relative: 31 %
Lymphs Abs: 2.1 10*3/uL (ref 0.7–4.0)
MCH: 31.2 pg (ref 26.0–34.0)
MCHC: 34.1 g/dL (ref 30.0–36.0)
MCV: 91.4 fL (ref 80.0–100.0)
Monocytes Absolute: 0.4 10*3/uL (ref 0.1–1.0)
Monocytes Relative: 5 %
Neutro Abs: 4.2 10*3/uL (ref 1.7–7.7)
Neutrophils Relative %: 63 %
Platelets: 200 10*3/uL (ref 150–400)
RBC: 4.3 MIL/uL (ref 3.87–5.11)
RDW: 14.4 % (ref 11.5–15.5)
WBC: 6.8 10*3/uL (ref 4.0–10.5)
nRBC: 0 % (ref 0.0–0.2)

## 2022-01-07 LAB — VITAMIN D 25 HYDROXY (VIT D DEFICIENCY, FRACTURES): Vit D, 25-Hydroxy: 59.75 ng/mL (ref 30–100)

## 2022-01-07 LAB — LACTATE DEHYDROGENASE: LDH: 157 U/L (ref 98–192)

## 2022-01-14 ENCOUNTER — Ambulatory Visit: Payer: 59 | Admitting: Physician Assistant

## 2022-01-21 ENCOUNTER — Encounter: Payer: Self-pay | Admitting: Pulmonary Disease

## 2022-01-21 ENCOUNTER — Ambulatory Visit (INDEPENDENT_AMBULATORY_CARE_PROVIDER_SITE_OTHER): Payer: 59 | Admitting: Pulmonary Disease

## 2022-01-21 VITALS — BP 136/82 | HR 82 | Temp 98.2°F | Ht 64.0 in | Wt 277.6 lb

## 2022-01-21 DIAGNOSIS — R0683 Snoring: Secondary | ICD-10-CM | POA: Diagnosis not present

## 2022-01-21 NOTE — Patient Instructions (Signed)
X home sleep test °

## 2022-01-21 NOTE — Progress Notes (Signed)
Subjective:    Patient ID: Kathleen Reynolds, female    DOB: 09-03-1960, 61 y.o.   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 She has recently been diagnosed with hypertension and started on olmesartan. Shows diabetes controlled on metformin.  Her boyfriend has noted loud snoring and she reports nonrefreshing sleep.  She is able to get through her workday but reports being tired at times when she wakes up. Epworth sleepiness score is 4.  Bedtime is between 11 PM to midnight, sleep latency about 30 minutes, she starts after being on her belly but rolls over on her side through the night, reports 2-3 nocturnal awakenings and is out of bed by 6:30 AM with occasional headaches and dryness of mouth. She has gained 20 pounds over the last 2 years  There is no history suggestive of cataplexy, sleep paralysis or parasomnias     Past Medical History:  Diagnosis Date   Anemia LIFELONG   HEAVY MENSES MAR 2012 HB 13 MCV 86.7 FERRITIN  66   Arthritis    Breast cancer (HCC)    Right   Breast mass in female    right   Colonic adenoma 02/05/2011   Diabetes mellitus without complication ([place])    pre diabetic   Hyperlipidemia    Left rotator cuff tear    Migraines    Morbid obesity ([place])    Personal history of chemotherapy    Personal history of radiation therapy    Pre-diabetes    RUPTURE ROTATOR CUFF 06/08/2007   Qualifier: Diagnosis of  By: Aline Brochure MD, Adele Dan, Achilles, right    Urticaria    Past Surgical History:  Procedure Laterality Date   ACHILLES TENDON SURGERY Right 09/22/2019   Procedure: RIGHT ACHILLES TENDON RECONSTRUCTION;  Surgeon: Newt Minion, MD;  Location: [place];  Service: Orthopedics;  Laterality: Right;   BREAST BIOPSY  02/13/2011   Procedure: BREAST BIOPSY WITH NEEDLE LOCALIZATION;  Surgeon:  Adin Hector, MD;  Location: [place];  Service: General;  Laterality: Right;   BREAST LUMPECTOMY Right 2013   BREAST SURGERY  11/1997   Left breast cancer-mastectomy   BREAST SURGERY  2/13-   rt axilly bx   COLONOSCOPY  2004 DR. SMITH   No polyp, hemorrhoids   COLONOSCOPY  06/09/10   YJE:HUDJSH adenomas    COLONOSCOPY N/A 08/19/2015   Procedure: COLONOSCOPY;  Surgeon: Danie Binder, MD;  [facility];  Service: Endoscopy;  Laterality: N/A;  130 - moved to 12:45 - Ginger notified pt   COLONOSCOPY WITH PROPOFOL N/A 11/29/2018   Fields: Past simple adenomas removed, diverticulosis, hemorrhoids.  Next colonoscopy in 3 years.   DILATION AND CURETTAGE OF UTERUS     DILATION AND CURETTAGE OF UTERUS  05/2002   hystersonogram  2009   MASTECTOMY  1999   left breast cancer    POLYPECTOMY  11/29/2018   Procedure: POLYPECTOMY;  Surgeon: Danie Binder, MD;  [facility];  Service: Endoscopy;;  colon   PORT-A-CATH REMOVAL  10/20/2011   Procedure: MINOR REMOVAL PORT-A-CATH;  Surgeon: Adin Hector, MD;  Location: [place];  Service: General;  Laterality: N/A;  Porta-cath removal  left   PORTACATH PLACEMENT  03/27/2011   Procedure: INSERTION PORT-A-CATH;  Surgeon:  Adin Hector, MD;  Location: [place];  Service: General;  Laterality: Left;  insert port a cath    Allergies  Allergen Reactions   Nsaids Anaphylaxis    Ibuprofen required hospitalization in 2016 due to ibuprofen   Ibuprofen Itching   Social History   Socioeconomic History   Marital status: Single    Spouse name: Not on file   Number of children: Not on file   Years of education: Not on file   Highest education level: Not on file  Occupational History   Not on file  Tobacco Use   Smoking status: Never   Smokeless tobacco: Never  Vaping Use   Vaping Use: Never used  Substance and Sexual Activity   Alcohol use: Yes    Alcohol/week: 2.0 standard  drinks of alcohol    Types: 2 Standard drinks or equivalent per week    Comment: occasional;    Drug use: No   Sexual activity: Yes    Birth control/protection: None  Other Topics Concern   Not on file  Social History Narrative   Single, 1 kid-61 yo female-lives with mom   Employed as a CNA   Rare Etoh.   No tobacco products   Social Determinants of Radio broadcast assistant Strain: Not on file  Food Insecurity: Not on file  Transportation Needs: Not on file  Physical Activity: Not on file  Stress: Not on file  Social Connections: Not on file  Intimate Partner Violence: Not on file    Family History  Problem Relation Age of Onset   Hypertension Mother    Cancer Father        lung cancer   Diabetes Sister    Cancer Brother        colon cancer, younger than the age of 74   Heart disease Sister        heart attack   Colon polyps Sister    Asthma Sister    Eczema Sister    Allergic rhinitis Neg Hx    Urticaria Neg Hx      Review of Systems   Constitutional: negative for anorexia, fevers and sweats  Eyes: negative for irritation, redness and visual disturbance  Ears, nose, mouth, throat, and face: negative for earaches, epistaxis, nasal congestion and sore throat  Respiratory: negative for cough, dyspnea on exertion, sputum and wheezing  Cardiovascular: negative for chest pain, dyspnea, lower extremity edema, orthopnea, palpitations and syncope  Gastrointestinal: negative for abdominal pain, constipation, diarrhea, melena, nausea and vomiting  Genitourinary:negative for dysuria, frequency and hematuria  Hematologic/lymphatic: negative for bleeding, easy bruising and lymphadenopathy  Musculoskeletal:negative for arthralgias, muscle weakness and stiff joints  Neurological: negative for coordination problems, gait problems, headaches and weakness  Endocrine: negative for diabetic symptoms including polydipsia, polyuria and weight loss     Objective:   Physical  Exam  Gen. Pleasant, obese, in no distress, normal affect ENT - no pallor,icterus, no post nasal drip, class 2-3 airway Neck: No JVD, no thyromegaly, no carotid bruits Lungs: no use of accessory muscles, no dullness to percussion, decreased without rales or rhonchi  Cardiovascular: Rhythm regular, heart sounds  normal, no murmurs or gallops, no peripheral edema Abdomen: soft and non-tender, no hepatosplenomegaly, BS normal. Musculoskeletal: No deformities, no cyanosis or clubbing Neuro:  alert, non focal, no tremors       Assessment & Plan:

## 2022-01-21 NOTE — Assessment & Plan Note (Signed)
Her snoring by itself is not bothersome.  Red flags here are obesity, new onset hypertension, narrow pharyngeal exam and nonrefreshing sleep  obstructive sleep apnea is possible & an overnight polysomnogram will be scheduled as a home study. The pathophysiology of obstructive sleep apnea , it's cardiovascular consequences & modes of treatment including CPAP were discused with the patient in detail & they evidenced understanding.  Pretest probability is intermediate.

## 2022-01-28 DIAGNOSIS — Z886 Allergy status to analgesic agent status: Secondary | ICD-10-CM | POA: Diagnosis not present

## 2022-01-28 DIAGNOSIS — Z853 Personal history of malignant neoplasm of breast: Secondary | ICD-10-CM | POA: Diagnosis not present

## 2022-01-28 DIAGNOSIS — R42 Dizziness and giddiness: Secondary | ICD-10-CM | POA: Diagnosis not present

## 2022-01-28 DIAGNOSIS — Z8249 Family history of ischemic heart disease and other diseases of the circulatory system: Secondary | ICD-10-CM | POA: Diagnosis not present

## 2022-01-28 DIAGNOSIS — I10 Essential (primary) hypertension: Secondary | ICD-10-CM | POA: Diagnosis not present

## 2022-01-28 DIAGNOSIS — Z6841 Body Mass Index (BMI) 40.0 and over, adult: Secondary | ICD-10-CM | POA: Diagnosis not present

## 2022-01-28 DIAGNOSIS — E119 Type 2 diabetes mellitus without complications: Secondary | ICD-10-CM | POA: Diagnosis not present

## 2022-01-28 DIAGNOSIS — Z833 Family history of diabetes mellitus: Secondary | ICD-10-CM | POA: Diagnosis not present

## 2022-01-28 DIAGNOSIS — Z7984 Long term (current) use of oral hypoglycemic drugs: Secondary | ICD-10-CM | POA: Diagnosis not present

## 2022-03-16 DIAGNOSIS — Z01419 Encounter for gynecological examination (general) (routine) without abnormal findings: Secondary | ICD-10-CM | POA: Diagnosis not present

## 2022-03-16 DIAGNOSIS — Z124 Encounter for screening for malignant neoplasm of cervix: Secondary | ICD-10-CM | POA: Diagnosis not present

## 2022-03-16 DIAGNOSIS — R69 Illness, unspecified: Secondary | ICD-10-CM | POA: Diagnosis not present

## 2022-03-16 DIAGNOSIS — Z01411 Encounter for gynecological examination (general) (routine) with abnormal findings: Secondary | ICD-10-CM | POA: Diagnosis not present

## 2022-03-16 DIAGNOSIS — Z6841 Body Mass Index (BMI) 40.0 and over, adult: Secondary | ICD-10-CM | POA: Diagnosis not present

## 2022-03-30 ENCOUNTER — Ambulatory Visit (HOSPITAL_COMMUNITY)
Admission: RE | Admit: 2022-03-30 | Discharge: 2022-03-30 | Disposition: A | Discharge status: Home / Self Care | Payer: 59 | Source: Ambulatory Visit | Attending: Physician Assistant | Admitting: Physician Assistant

## 2022-03-30 DIAGNOSIS — Z1231 Encounter for screening mammogram for malignant neoplasm of breast: Secondary | ICD-10-CM | POA: Diagnosis not present

## 2022-03-31 ENCOUNTER — Ambulatory Visit: Payer: 59 | Admitting: Family Medicine

## 2022-04-02 ENCOUNTER — Encounter: Payer: Self-pay | Admitting: Radiology

## 2022-04-07 ENCOUNTER — Ambulatory Visit: Payer: 59 | Admitting: Physician Assistant

## 2022-04-08 ENCOUNTER — Inpatient Hospital Stay: Payer: 59 | Attending: Physician Assistant | Admitting: Physician Assistant

## 2022-04-08 VITALS — BP 130/71 | HR 71 | Temp 98.0°F | Resp 16 | Wt 282.0 lb

## 2022-04-08 DIAGNOSIS — Z800 Family history of malignant neoplasm of digestive organs: Secondary | ICD-10-CM | POA: Insufficient documentation

## 2022-04-08 DIAGNOSIS — C50911 Malignant neoplasm of unspecified site of right female breast: Secondary | ICD-10-CM | POA: Diagnosis not present

## 2022-04-08 DIAGNOSIS — Z171 Estrogen receptor negative status [ER-]: Secondary | ICD-10-CM

## 2022-04-08 DIAGNOSIS — Z1509 Genetic susceptibility to other malignant neoplasm: Secondary | ICD-10-CM

## 2022-04-08 DIAGNOSIS — C50912 Malignant neoplasm of unspecified site of left female breast: Secondary | ICD-10-CM

## 2022-04-08 DIAGNOSIS — Z801 Family history of malignant neoplasm of trachea, bronchus and lung: Secondary | ICD-10-CM | POA: Diagnosis not present

## 2022-04-08 DIAGNOSIS — Z923 Personal history of irradiation: Secondary | ICD-10-CM | POA: Diagnosis not present

## 2022-04-08 DIAGNOSIS — Z1501 Genetic susceptibility to malignant neoplasm of breast: Secondary | ICD-10-CM | POA: Insufficient documentation

## 2022-04-08 DIAGNOSIS — Z853 Personal history of malignant neoplasm of breast: Secondary | ICD-10-CM | POA: Insufficient documentation

## 2022-04-08 DIAGNOSIS — Z9012 Acquired absence of left breast and nipple: Secondary | ICD-10-CM | POA: Diagnosis not present

## 2022-04-08 DIAGNOSIS — Z1502 Genetic susceptibility to malignant neoplasm of ovary: Secondary | ICD-10-CM | POA: Diagnosis not present

## 2022-04-08 NOTE — Patient Instructions (Signed)
[facility] **   You were seen today by Tarri Abernethy PA-C for your history of breast cancer.    HISTORY OF BREAST CANCER You do not have any evidence of returning breast cancer at this time.  BRCA 1 POSITIVE Please see the attached handout for important information on your BRCA1 positive diagnosis. You are at increased risk for recurrent breast cancer, uterine/endometrial cancer, and ovarian cancer. Standard risk reducing surgeries includes bilateral mastectomy, hysterectomy, and removal of ovaries.  Continue to follow-up with your gynecologist regarding possible hysterectomy or ovary surgery.  If you decide that you would like to pursue full mastectomy, please let us know. Since you do still have your right breast, you qualify for annual breast MRI (staggered in between annual mammograms so that you are receiving imaging study every 6 months).  FOLLOW-UP APPOINTMENT: Annual office visit in 1 year  ** Thank you for trusting me with your healthcare!  I strive to provide all of my patients with quality care at each visit.  If you receive a survey for this visit, I would be so grateful to you for taking the time to provide feedback.  Thank you in advance!  ~ Oluchi Pucci                   Dr. Derek Jack   &   Tarri Abernethy, PA-C   - - - - - - - - - - - - - - - - - -    Thank you for choosing [facility] to provide your oncology and hematology care.  To afford each patient quality time with our provider, please arrive at least 15 minutes before your scheduled appointment time.   If you have a lab appointment with the [facility] please come in thru the Main Entrance and check in at the main information desk.  You need to re-schedule your appointment should you arrive 10 or more minutes late.  We strive to give you quality time with our providers, and arriving late  affects you and other patients whose appointments are after yours.  Also, if you no show three or more times for appointments you may be dismissed from the clinic at the providers discretion.     Again, thank you for choosing [facility].  Our hope is that these requests will decrease the amount of time that you wait before being seen by our physicians.       _____________________________________________________________  Should you have questions after your visit to [facility], please contact our office at [phone] and follow the prompts.  Our office hours are 8:00 a.m. and 4:30 p.m. Monday - Friday.  Please note that voicemails left after 4:00 p.m. may not be returned until the following business day.  We are closed weekends and major holidays.  You do have access to a nurse 24-7, just call the main number to the clinic [phone] and do not press any options, hold on the line and a nurse will answer the phone.    For prescription refill requests, have your pharmacy contact our office and allow 72 hours.

## 2022-04-08 NOTE — Progress Notes (Signed)
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, a 62 y.o. 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 Kathleen Reynolds was last seen in clinic by Tarri Abernethy PA-C on 03/06/2021, with phone visit to discuss results on 03/26/2021.   At today's visit, she  reports feeling fairly well.  She denies any recent hospitalizations, surgeries, or changes in her  baseline health status.   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  She reports chronic knee pain.  She denies any new onset bone pain.  She has not noticed any new lumps or bumps.  She denies any chest pain, dyspnea, or abdominal pain.  She has no new headaches, seizures, or focal neurologic deficits.  No B symptoms such as fever, chills, night sweats, unintentional weight loss.  She reports 50% energy and 100% appetite.  She is maintaining stable weight at this time.  ASSESSMENT & PLAN:  1.  BILATERAL TRIPLE NEGATIVE BREAST Reynolds (s/p left mastectomy): - Patient was first diagnosed in 1999 with left sided  breast Reynolds stage II grade 3. - Patient had a left mastectomy on 11/27/1997.  Showed a 2.3 cm primary mass with 7- lymph nodes.  BRCA 1 positive. -She was treated with AC x4 cycles followed by Taxol with 4 cycles. - Patient was diagnosed with right-sided triple negative breast Reynolds in 2013. - Dr. Fanny Skates performed a right lumpectomy with sentinel node biopsy on 02/13/2011.  It showed triple negative breast Reynolds with negative lymph nodes.  Her Ki-67 marker was 54%. - A right mastectomy was recommended at this time but she chose to have the lumpectomy instead. - She has now status post 6 cycles of carboplatin and Taxotere and status post radiation. - Physical exam at visit on 03/06/2021 was significant for small left axillary lymphadenopathy as well as hard fixed nodules beneath the right nipple.  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 - Diagnostic mammogram and ultrasound (03/25/2021): Benign right breast fat necrosis corresponding with patient's palpable lump.  Unremarkable ultrasound evaluation of left axilla. - Most recent mammogram (03/30/2022): BI-RADS Category 1, negative - Labs (01/07/2022): Normal CBC.  CMP unremarkable.  Normal LDH and vitamin D. - Physical exam today is stable with again noted soft cystic mass (known lipoma) inferior to left mastectomy scar, as well as fixed nodules beneath right nipple previously shown to be fat necrosis. - No "red flag" symptoms and history/ROS  - PLAN: Since patient has BRCA1 positivity and has not had bilateral mastectomy, she will benefit from annual mammogram and annual  breast MRI, staggered every 6 months.   MRI breast (right) in 6 months Unilateral screening mammogram (right) in 1 year - Labs and annual physical exam/office visit in 1 year.  2. BRCA 1 POSITIVE - S/p left-sided mastectomy - She was offered bilateral mastectomy in the past but declined at that time.  At today's  visit, she reports that she will think about pursuing right-sided mastectomy in the future. - She has NOT undergone risk-reducing TAH/BSO, but is following with gynecology to discuss this. - She has never seen a Dietitian.  (Declines at this time) - Family history of BRCA - several maternal cousins have BRCA mutation - Family history of Reynolds includes maternal aunt and multiple maternal cousins passed away from various cancers including breast Reynolds and 1 maternal cousin with pancreatic Reynolds - We have discussed some of the general implications of BRCA1 positivity, including the following: Cancers associated with BRCA1 in females are breast Reynolds, ovarian Reynolds, pancreatic Reynolds, and possibly uterine Reynolds (based on limited data).  She has NOT completed recommended risk reducing surgeries (bilateral mastectomy and TAH/BSO).  Guidelines currently only recommend imaging to screen for pancreatic Reynolds in patients who have BRCA positivity PLUS family history of pancreatic Reynolds.  Offered to reach out to GI to see if patient would qualify for pancreatic Reynolds screening based on extended family history of pancreatic Reynolds.  She declines at this time.  (If she changes her mind in the future, we will reach out to [facility]/Dr. Cirigliano to see if she meets criteria) Guidelines do not currently support annual breast MRI in patients who are s/p mastectomy.  However, since patient still has right breast, she will benefit from continued annual mammogram and annual right breast MRI.   Recommendation for first-degree relatives over age 3 to be tested for BRCA1 mutation  - PLAN: Patient educated to avoid hormonal supplements and medications. - Recommend referral to genetic counselor.  (Patient declines) - Recommend bilateral mastectomy and TAH/BSO.  (Patient declines at this time, but is following with gynecology and will "think about it") - Consider pancreatic Reynolds screening via GI.  (Patient  declines at this time) - Annual right breast mammogram and right breast MRI, staggered every 6 months - Continue GYN follow-up for risk of ovarian and endometrial Reynolds  3.  Bone health - DEXA scan done on 12/19/2018 showed T score of 0.6, which is normal - Most recent vitamin D (01/07/2022) is normal at 59.75 - PLAN: Continue calcium, vitamin D, and weightbearing exercises.  PLAN SUMMARY: >> Right-sided breast MRI in 6 months >> Unilateral (right) breast screening mammogram in 1 year >> Labs in 1 year = CBC/D, CMP, vitamin D >> OFFICE visit in 1 year (after mammogram and labs)    REVIEW OF SYSTEMS:   Review of Systems  Constitutional:  Positive for fatigue. Negative for appetite change, chills, diaphoresis, fever and unexpected weight change.  HENT:   Negative for lump/mass and nosebleeds.   Eyes:  Negative for eye problems.  Respiratory:  Positive for shortness of breath. Negative for cough and hemoptysis.   Cardiovascular:  Negative for chest pain, leg swelling and palpitations.  Gastrointestinal:  Negative for abdominal pain, blood in stool, constipation, diarrhea, nausea and vomiting.  Genitourinary:  Negative for hematuria.   Skin: Negative.   Neurological:  Positive for dizziness (Vertigo), headaches and numbness. Negative for light-headedness.  Hematological:  Does not bruise/bleed easily.  Psychiatric/Behavioral:  Positive for sleep disturbance (Occasionally).     PHYSICAL  EXAM:   Performance status (ECOG): 1 - Symptomatic but completely ambulatory  There were no vitals filed for this visit. Wt Readings from Last 3 Encounters:  01/21/22 277 lb 9.6 oz (125.9 kg)  11/25/21 274 lb 1.3 oz (124.3 kg)  08/12/21 274 lb 1.9 oz (124.3 kg)   Physical Exam Constitutional:      Appearance: Normal appearance. She is morbidly obese.  HENT:     Head: Normocephalic and atraumatic.     Mouth/Throat:     Mouth: Mucous membranes are moist.  Eyes:     Extraocular Movements:  Extraocular movements intact.     Pupils: Pupils are equal, round, and reactive to light.  Cardiovascular:     Rate and Rhythm: Normal rate and regular rhythm.     Pulses: Normal pulses.     Heart sounds: Normal heart sounds.  Pulmonary:     Effort: Pulmonary effort is normal.     Breath sounds: Normal breath sounds.  Chest:  Breasts:    Right: Tenderness present.     Left: Tenderness present.    Abdominal:     General: Bowel sounds are normal.     Palpations: Abdomen is soft.     Tenderness: There is no abdominal tenderness.  Musculoskeletal:        General: No swelling.     Right lower leg: No edema.     Left lower leg: No edema.  Lymphadenopathy:     Cervical: No cervical adenopathy.     Upper Body:     Right upper body: No supraclavicular, axillary or pectoral adenopathy.     Left upper body: Axillary adenopathy present. No supraclavicular or pectoral adenopathy.  Skin:    General: Skin is warm and dry.  Neurological:     General: No focal deficit present.     Mental Status: She is alert and oriented to person, place, and time.  Psychiatric:        Mood and Affect: Mood normal.        Behavior: Behavior normal.      PAST MEDICAL/SURGICAL HISTORY:  Past Medical History:  Diagnosis Date   Anemia LIFELONG   HEAVY MENSES MAR 2012 HB 13 MCV 86.7 FERRITIN  66   Arthritis    Breast Reynolds (HCC)    Right   Breast mass in female    right   Colonic adenoma 02/05/2011   Diabetes mellitus without complication ([place])    pre diabetic   Hyperlipidemia    Left rotator cuff tear    Migraines    Morbid obesity ([place])    Personal history of chemotherapy    Personal history of radiation therapy    Pre-diabetes    RUPTURE ROTATOR CUFF 06/08/2007   Qualifier: Diagnosis of  By: Aline Brochure MD, Adele Dan, Achilles, right    Urticaria    Past Surgical History:  Procedure Laterality Date   ACHILLES TENDON SURGERY Right 09/22/2019   Procedure: RIGHT ACHILLES TENDON  RECONSTRUCTION;  Surgeon: Newt Minion, MD;  Location: [facility];  Service: Orthopedics;  Laterality: Right;   BREAST BIOPSY  02/13/2011   Procedure: BREAST BIOPSY WITH NEEDLE LOCALIZATION;  Surgeon: Adin Hector, MD;  Location: [facility];  Service: General;  Laterality: Right;   BREAST LUMPECTOMY Right 2013   BREAST SURGERY  11/1997   Left breast Reynolds-mastectomy   BREAST SURGERY  2/13-   rt axilly bx   COLONOSCOPY  2004 DR. SMITH  No polyp, hemorrhoids   COLONOSCOPY  06/09/10   [id number] adenomas    COLONOSCOPY N/A 08/19/2015   Procedure: COLONOSCOPY;  Surgeon: Danie Binder, MD;  [facility];  Service: Endoscopy;  Laterality: N/A;  130 - moved to 12:45 - Ginger notified pt   COLONOSCOPY WITH PROPOFOL N/A 11/29/2018   Fields: Past simple adenomas removed, diverticulosis, hemorrhoids.  Next colonoscopy in 3 years.   DILATION AND CURETTAGE OF UTERUS     DILATION AND CURETTAGE OF UTERUS  05/2002   hystersonogram  2009   MASTECTOMY  1999   left breast Reynolds    POLYPECTOMY  11/29/2018   Procedure: POLYPECTOMY;  Surgeon: Danie Binder, MD;  [facility];  Service: Endoscopy;;  colon   PORT-A-CATH REMOVAL  10/20/2011   Procedure: MINOR REMOVAL PORT-A-CATH;  Surgeon: Adin Hector, MD;  Location: [facility];  Service: General;  Laterality: N/A;  Porta-cath removal  left   PORTACATH PLACEMENT  03/27/2011   Procedure: INSERTION PORT-A-CATH;  Surgeon: Adin Hector, MD;  Location: [facility];  Service: General;  Laterality: Left;  insert port a cath    SOCIAL HISTORY:  Social History   Socioeconomic History   Marital status: Single    Spouse name: Not on file   Number of children: Not on file   Years of education: Not on file   Highest education level: Not on file  Occupational History   Not on file  Tobacco Use   Smoking status: Never   Smokeless tobacco: Never  Vaping Use   Vaping Use: Never used   Substance and Sexual Activity   Alcohol use: Yes    Alcohol/week: 2.0 standard drinks of alcohol    Types: 2 Standard drinks or equivalent per week    Comment: occasional;    Drug use: No   Sexual activity: Yes    Birth control/protection: None  Other Topics Concern   Not on file  Social History Narrative   Single, 1 kid-62 yo female-lives with mom   Employed as a CNA   Rare Etoh.   No tobacco products   Social Determinants of Radio broadcast assistant Strain: Not on file  Food Insecurity: Not on file  Transportation Needs: Not on file  Physical Activity: Not on file  Stress: Not on file  Social Connections: Not on file  Intimate Partner Violence: Not on file    FAMILY HISTORY:  Family History  Problem Relation Age of Onset   Hypertension Mother    Reynolds Father        lung Reynolds   Diabetes Sister    Reynolds Brother        colon Reynolds, younger than the age of 44   Heart disease Sister        heart attack   Colon polyps Sister    Asthma Sister    Eczema Sister    Allergic rhinitis Neg Hx    Urticaria Neg Hx     CURRENT MEDICATIONS:  Current Outpatient Medications  Medication Sig Dispense Refill   Ascorbic Acid (VITAMIN C) 500 MG CAPS Take 500 mg by mouth daily.      Biotin w/ Vitamins C & E (HAIR/SKIN/NAILS PO) Take 2 tablets by mouth daily.     butalbital-acetaminophen-caffeine (FIORICET) 50-325-40 MG tablet Take one tablet by mouth two times daily as needed, for headache (Patient taking differently: Take 1-2 tablets by mouth 2 (two) times daily as needed  for headache or migraine.) 20 tablet 0   calcium carbonate (OSCAL) 1500 (600 Ca) MG TABS tablet Take 600 mg of elemental calcium by mouth 2 (two) times daily with a meal.     cyclobenzaprine (FLEXERIL) 10 MG tablet Take 1 tablet (10 mg total) by mouth 2 (two) times daily as needed for muscle spasms. 20 tablet 0   ELDERBERRY PO Take 1,000 mg by mouth daily.     fluticasone (FLONASE) 50 MCG/ACT nasal spray  Place 2 sprays into both nostrils daily. 48 mL 1   meclizine (ANTIVERT) 25 MG tablet TAKE 1 TABLET BY MOUTH 3 TIMES DAILY AS NEEDED FOR DIZZINESS. 30 tablet 2   metFORMIN (GLUCOPHAGE) 500 MG tablet Take 1 tablet (500 mg total) by mouth 2 (two) times daily with a meal. 180 tablet 3   Multiple Vitamin (MULITIVITAMIN WITH MINERALS) TABS Take 1 tablet by mouth daily.     olmesartan (BENICAR) 20 MG tablet TAKE 1 TABLET BY MOUTH EVERY DAY 90 tablet 1   phentermine (ADIPEX-P) 37.5 MG tablet Tke half tablet by mouth once daily every morning 30 tablet 1   zinc gluconate 50 MG tablet Take 50 mg by mouth daily.     No current facility-administered medications for this visit.    ALLERGIES:  Allergies  Allergen Reactions   Nsaids Anaphylaxis    Ibuprofen required hospitalization in 2016 due to ibuprofen   Ibuprofen Itching    LABORATORY DATA:  I have reviewed the labs as listed.     Latest Ref Rng & Units 01/07/2022   11:25 AM 02/27/2021    8:10 AM 12/23/2020    9:51 AM  CBC  WBC 4.0 - 10.5 K/uL 6.8  4.3  4.5   Hemoglobin 12.0 - 15.0 g/dL 13.4  13.2  13.8   Hematocrit 36.0 - 46.0 % 39.3  40.1  40.1   Platelets 150 - 400 K/uL 200  169  190       Latest Ref Rng & Units 01/07/2022   11:25 AM 11/27/2021    8:20 AM 06/13/2021    8:34 AM  CMP  Glucose 70 - 99 mg/dL 95  104  95   BUN 8 - 23 mg/dL '13  14  14   '$ Creatinine 0.44 - 1.00 mg/dL 0.71  0.72  0.71   Sodium 135 - 145 mmol/L 138  139  139   Potassium 3.5 - 5.1 mmol/L 3.4  3.9  4.2   Chloride 98 - 111 mmol/L 105  104  104   CO2 22 - 32 mmol/L '24  20  21   '$ Calcium 8.9 - 10.3 mg/dL 9.4  9.2  9.3   Total Protein 6.5 - 8.1 g/dL 7.3  6.8  7.0   Total Bilirubin 0.3 - 1.2 mg/dL 0.5  0.2  0.3   Alkaline Phos 38 - 126 U/L 64  71  74   AST 15 - 41 U/L '20  15  19   '$ ALT 0 - 44 U/L '23  18  20     '$ DIAGNOSTIC IMAGING:  I have independently reviewed the scans and discussed with the patient. MM 3D SCREEN BREAST UNI RIGHT  Result Date:  04/01/2022 CLINICAL DATA:  Screening. EXAM: DIGITAL SCREENING UNILATERAL RIGHT MAMMOGRAM WITH CAD AND TOMOSYNTHESIS TECHNIQUE: Right screening digital craniocaudal and mediolateral oblique mammograms were obtained. Right screening digital breast tomosynthesis was performed. The images were evaluated with computer-aided detection. COMPARISON:  Previous exam(s). ACR Breast Density Category b: There are scattered  areas of fibroglandular density. FINDINGS: There are no findings suspicious for malignancy. IMPRESSION: No mammographic evidence of malignancy. A result letter of this screening mammogram will be mailed directly to the patient. RECOMMENDATION: Screening mammogram in one year. (Code:SM-B-01Y) BI-RADS CATEGORY  1: Negative. Electronically Signed   By: Lovey Newcomer M.D.   On: 04/01/2022 06:03     WRAP UP:  All questions were answered. The patient knows to call the clinic with any problems, questions or concerns.  Medical decision making: Moderate  Time spent on visit: I spent 25 minutes counseling the patient face to face. The total time spent in the appointment was 40 minutes and more than 50% was on counseling.  Harriett Rush, PA-C  04/08/22 3:02 PM

## 2022-04-09 ENCOUNTER — Encounter: Payer: Self-pay | Admitting: Family Medicine

## 2022-04-09 ENCOUNTER — Ambulatory Visit (INDEPENDENT_AMBULATORY_CARE_PROVIDER_SITE_OTHER): Payer: 59 | Admitting: Family Medicine

## 2022-04-09 VITALS — BP 116/76 | HR 66 | Ht 64.0 in | Wt 283.1 lb

## 2022-04-09 DIAGNOSIS — M1711 Unilateral primary osteoarthritis, right knee: Secondary | ICD-10-CM | POA: Diagnosis not present

## 2022-04-09 DIAGNOSIS — R7303 Prediabetes: Secondary | ICD-10-CM

## 2022-04-09 DIAGNOSIS — E785 Hyperlipidemia, unspecified: Secondary | ICD-10-CM

## 2022-04-09 DIAGNOSIS — E559 Vitamin D deficiency, unspecified: Secondary | ICD-10-CM

## 2022-04-09 DIAGNOSIS — F322 Major depressive disorder, single episode, severe without psychotic features: Secondary | ICD-10-CM | POA: Diagnosis not present

## 2022-04-09 DIAGNOSIS — I10 Essential (primary) hypertension: Secondary | ICD-10-CM | POA: Diagnosis not present

## 2022-04-09 DIAGNOSIS — F411 Generalized anxiety disorder: Secondary | ICD-10-CM

## 2022-04-09 MED ORDER — MONTELUKAST SODIUM 10 MG PO TABS: 10.0000 mg | ORAL_TABLET | Freq: Every day | ORAL | 3 refills | Status: DC

## 2022-04-09 MED ORDER — TRAMADOL HCL 50 MG PO TABS: ORAL_TABLET | ORAL | 0 refills | Status: DC

## 2022-04-09 MED ORDER — PHENTERMINE HCL 37.5 MG PO TABS: ORAL_TABLET | ORAL | 1 refills | Status: DC

## 2022-04-09 MED ORDER — VENLAFAXINE HCL ER 37.5 MG PO CP24: 37.5000 mg | ORAL_CAPSULE | Freq: Every day | ORAL | 3 refills | Status: DC

## 2022-04-09 MED ORDER — PREDNISONE 10 MG PO TABS: 10.0000 mg | ORAL_TABLET | Freq: Two times a day (BID) | ORAL | 0 refills | Status: DC

## 2022-04-09 MED ORDER — BUTALBITAL-APAP-CAFFEINE 50-325-40 MG PO TABS: 1.0000 | ORAL_TABLET | Freq: Four times a day (QID) | ORAL | 0 refills | Status: DC | PRN

## 2022-04-09 NOTE — Patient Instructions (Addendum)
F/U in 8 to 10 weeks, call if you need me sooner  New for anxiety/ depression is effexor and you will be referred to therapist  New for knee pain is as needed tramadol, max twice weekly please  Prednisone short term, and montelukast daily for allergies  Fioricet as needed, for headache  Labs today, lipid, cmp and EGFr and hBa1C and TSH  Half phentermine for weight loss and referral to Nutrition  It is important that you exercise regularly at least 30 minutes 5 times a week. If you develop chest pain, have severe difficulty breathing, or feel very tired, stop exercising immediately and seek medical attention   Think about what you will eat, plan ahead. Choose " clean, green, fresh or frozen" over canned, processed or packaged foods which are more sugary, salty and fatty. 70 to 75% of food eaten should be vegetables and fruit. Three meals at set times with snacks allowed between meals, but they must be fruit or vegetables. Aim to eat over a 12 hour period , example 7 am to 7 pm, and STOP after  your last meal of the day. Drink water,generally about 64 ounces per day, no other drink is as healthy. Fruit juice is best enjoyed in a healthy way, by EATING the fruit. Thanks for choosing [facility], we consider it a privelige to serve you.

## 2022-04-09 NOTE — Progress Notes (Signed)
Kathleen Reynolds     MRN: [id number]      DOB: 1961-01-24   HPI Kathleen Reynolds is here for follow up and re-evaluation of chronic medical conditions, medication management and review of any available recent lab and radiology data.  Preventive health is updated, specifically  Cancer screening and Immunization.   Questions or concerns regarding consultations or procedures which the PT has had in the interim are  addressed. The PT denies any adverse reactions to current medications since the last visit.  There are no new concerns.  There are no specific complaints   ROS Denies recent fever or chills. Denies sinus pressure, nasal congestion, ear pain or sore throat. Denies chest congestion, productive cough or wheezing. Denies chest pains, palpitations and leg swelling Denies abdominal pain, nausea, vomiting,diarrhea or constipation.   Denies dysuria, frequency, hesitancy or incontinence.  Denies skin break down or rash.   PE  BP 116/76 (BP Location: Right Arm, Patient Position: Sitting, Cuff Size: Large)   Pulse 66   Ht '5\' 4"'$  (1.626 m)   Wt 283 lb 1.9 oz (128.4 kg)   LMP 03/27/2011   SpO2 92%   BMI 48.60 kg/m   Patient alert and oriented and in no cardiopulmonary distress.  HEENT: No facial asymmetry, EOMI,     Neck supple .  Chest: Clear to auscultation bilaterally.  CVS: S1, S2 no murmurs, no S3.Regular rate.  ABD: Soft non tender.   Ext: No edema  MS: decreased ROM spine, shoulders, hips and knees.  Skin: Intact, no ulcerations or rash noted.  Psych: Good eye contact, normal affect. At times tearful. Memory intact not anxious or depressed appearing.  CNS: CN 2-12 intact, power,  normal throughout.no focal deficits noted.   Assessment & Plan  Depression, major, single episode, severe ([employer]) Start effexort , re eval in 8 weeks, refer therapy  GAD (generalized anxiety disorder) Start effexor and refer to Therapist  Hyperlipidemia LDL goal  <100 Hyperlipidemia:Low fat diet discussed and encouraged.   Lipid Panel  Lab Results  Component Value Date   CHOL 216 (H) 04/10/2022   HDL 55 04/10/2022   LDLCALC 139 (H) 04/10/2022   TRIG 121 04/10/2022   CHOLHDL 3.9 04/10/2022     Needs to reduce dietary fat  Prediabetes Patient educated about the importance of limiting  Carbohydrate intake , the need to commit to daily physical activity for a minimum of 30 minutes , and to commit weight loss. The fact that changes in all these areas will reduce or eliminate all together the development of diabetes is stressed.  Deteriorated     Latest Ref Rng & Units 04/10/2022    9:35 AM 01/07/2022   11:25 AM 11/27/2021    8:20 AM 06/13/2021    8:34 AM 02/27/2021    8:10 AM  Diabetic Labs  HbA1c 4.8 - 5.6 % 6.0   5.9  6.1    Chol 100 - 199 mg/dL 216   205  190    HDL >39 mg/dL 55   48  59    Calc LDL 0 - 99 mg/dL 139   134  112    Triglycerides 0 - 149 mg/dL 121   127  108    Creatinine 0.57 - 1.00 mg/dL 0.72  0.71  0.72  0.71  0.72       04/09/2022    8:29 AM 04/08/2022    1:48 PM 01/21/2022    8:32 AM 11/25/2021    9:47 AM  11/25/2021    9:10 AM 08/12/2021   10:23 AM 06/10/2021    8:02 AM  BP/Weight  Systolic BP [id number]  Diastolic BP 76 71 82 72 82 73 79  Wt. (Lbs) 283.12 282 277.6  274.08 274.12 278  BMI 48.6 kg/m2 48.41 kg/m2 47.65 kg/m2  47.05 kg/m2 47.05 kg/m2 47.72 kg/m2      Latest Ref Rng & Units 07/02/2020   12:00 AM  Foot/eye exam completion dates  Eye Exam No Retinopathy No Retinopathy         This result is from an external source.      Obesity, morbid, BMI 40.0-49.9 ([place])  Patient re-educated about  the importance of commitment to a  minimum of 150 minutes of exercise per week as able.  The importance of healthy food choices with portion control discussed, as well as eating regularly and within a 12 hour window most days. The need to choose "clean , green" food 50 to 75% of the time is  discussed, as well as to make water the primary drink and set a goal of 64 ounces water daily.       04/09/2022    8:29 AM 04/08/2022    1:48 PM 01/21/2022    8:32 AM  Weight /BMI  Weight 283 lb 1.9 oz 282 lb 277 lb 9.6 oz  Height '5\' 4"'$  (1.626 m)  '5\' 4"'$  (1.626 m)  BMI 48.6 kg/m2 48.41 kg/m2 47.65 kg/m2    Unchanged start half phentermine daily  Vitamin D deficiency Updated lab needed at/ before next visit.   Osteoarthritis of right knee  Recurrent flare of pain and stiffness, limited tramdol for as needed use, weight loss encouraged strongly  Essential hypertension Controlled, no change in medication DASH diet and commitment to daily physical activity for a minimum of 30 minutes discussed and encouraged, as a part of hypertension management. The importance of attaining a healthy weight is also discussed.     04/09/2022    8:29 AM 04/08/2022    1:48 PM 01/21/2022    8:32 AM 11/25/2021    9:47 AM 11/25/2021    9:10 AM 08/12/2021   10:23 AM 06/10/2021    8:02 AM  BP/Weight  Systolic BP [id number]  Diastolic BP 76 71 82 72 82 73 79  Wt. (Lbs) 283.12 282 277.6  274.08 274.12 278  BMI 48.6 kg/m2 48.41 kg/m2 47.65 kg/m2  47.05 kg/m2 47.05 kg/m2 47.72 kg/m2

## 2022-04-10 DIAGNOSIS — E785 Hyperlipidemia, unspecified: Secondary | ICD-10-CM | POA: Diagnosis not present

## 2022-04-10 DIAGNOSIS — R7303 Prediabetes: Secondary | ICD-10-CM | POA: Diagnosis not present

## 2022-04-10 DIAGNOSIS — I10 Essential (primary) hypertension: Secondary | ICD-10-CM | POA: Diagnosis not present

## 2022-04-11 LAB — CMP14+EGFR
ALT: 17 IU/L (ref 0–32)
AST: 16 IU/L (ref 0–40)
Albumin/Globulin Ratio: 1.7 (ref 1.2–2.2)
Albumin: 4.2 g/dL (ref 3.9–4.9)
Alkaline Phosphatase: 65 IU/L (ref 44–121)
BUN/Creatinine Ratio: 19 (ref 12–28)
BUN: 14 mg/dL (ref 8–27)
Bilirubin Total: 0.3 mg/dL (ref 0.0–1.2)
CO2: 20 mmol/L (ref 20–29)
Calcium: 9 mg/dL (ref 8.7–10.3)
Chloride: 104 mmol/L (ref 96–106)
Creatinine, Ser: 0.72 mg/dL (ref 0.57–1.00)
Globulin, Total: 2.5 g/dL (ref 1.5–4.5)
Glucose: 94 mg/dL (ref 70–99)
Potassium: 4.2 mmol/L (ref 3.5–5.2)
Sodium: 138 mmol/L (ref 134–144)
Total Protein: 6.7 g/dL (ref 6.0–8.5)
eGFR: 95 mL/min/{1.73_m2} (ref >59)

## 2022-04-11 LAB — LIPID PANEL
Chol/HDL Ratio: 3.9 ratio (ref 0.0–4.4)
Cholesterol, Total: 216 mg/dL — ABNORMAL HIGH (ref 100–199)
HDL: 55 mg/dL (ref >39)
LDL Chol Calc (NIH): 139 mg/dL — ABNORMAL HIGH (ref 0–99)
Triglycerides: 121 mg/dL (ref 0–149)
VLDL Cholesterol Cal: 22 mg/dL (ref 5–40)

## 2022-04-11 LAB — HEMOGLOBIN A1C
Est. average glucose Bld gHb Est-mCnc: 126 mg/dL
Hgb A1c MFr Bld: 6 % — ABNORMAL HIGH (ref 4.8–5.6)

## 2022-04-11 LAB — TSH: TSH: 2.24 u[IU]/mL (ref 0.450–4.500)

## 2022-04-15 ENCOUNTER — Ambulatory Visit: Payer: 59

## 2022-04-15 DIAGNOSIS — G4733 Obstructive sleep apnea (adult) (pediatric): Secondary | ICD-10-CM

## 2022-04-15 DIAGNOSIS — R0683 Snoring: Secondary | ICD-10-CM

## 2022-04-16 ENCOUNTER — Encounter: Payer: Self-pay | Admitting: Family Medicine

## 2022-04-16 DIAGNOSIS — F322 Major depressive disorder, single episode, severe without psychotic features: Secondary | ICD-10-CM | POA: Insufficient documentation

## 2022-04-16 DIAGNOSIS — F411 Generalized anxiety disorder: Secondary | ICD-10-CM | POA: Insufficient documentation

## 2022-04-16 NOTE — Assessment & Plan Note (Signed)
Start effexort , re eval in 8 weeks, refer therapy

## 2022-04-16 NOTE — Assessment & Plan Note (Signed)
  Patient re-educated about  the importance of commitment to a  minimum of 150 minutes of exercise per week as able.  The importance of healthy food choices with portion control discussed, as well as eating regularly and within a 12 hour window most days. The need to choose "clean , green" food 50 to 75% of the time is discussed, as well as to make water the primary drink and set a goal of 64 ounces water daily.       04/09/2022    8:29 AM 04/08/2022    1:48 PM 01/21/2022    8:32 AM  Weight /BMI  Weight 283 lb 1.9 oz 282 lb 277 lb 9.6 oz  Height 5\' 4"  (1.626 m)  5\' 4"  (1.626 m)  BMI 48.6 kg/m2 48.41 kg/m2 47.65 kg/m2    Unchanged start half phentermine daily

## 2022-04-16 NOTE — Assessment & Plan Note (Signed)
Updated lab needed at/ before next visit.   

## 2022-04-16 NOTE — Assessment & Plan Note (Signed)
Recurrent flare of pain and stiffness, limited tramdol for as needed use, weight loss encouraged strongly

## 2022-04-16 NOTE — Assessment & Plan Note (Signed)
Patient educated about the importance of limiting  Carbohydrate intake , the need to commit to daily physical activity for a minimum of 30 minutes , and to commit weight loss. The fact that changes in all these areas will reduce or eliminate all together the development of diabetes is stressed.  Deteriorated     Latest Ref Rng & Units 04/10/2022    9:35 AM 01/07/2022   11:25 AM 11/27/2021    8:20 AM 06/13/2021    8:34 AM 02/27/2021    8:10 AM  Diabetic Labs  HbA1c 4.8 - 5.6 % 6.0   5.9  6.1    Chol 100 - 199 mg/dL 216   205  190    HDL >39 mg/dL 55   48  59    Calc LDL 0 - 99 mg/dL 139   134  112    Triglycerides 0 - 149 mg/dL 121   127  108    Creatinine 0.57 - 1.00 mg/dL 0.72  0.71  0.72  0.71  0.72       04/09/2022    8:29 AM 04/08/2022    1:48 PM 01/21/2022    8:32 AM 11/25/2021    9:47 AM 11/25/2021    9:10 AM 08/12/2021   10:23 AM 06/10/2021    8:02 AM  BP/Weight  Systolic BP [id number]  Diastolic BP 76 71 82 72 82 73 79  Wt. (Lbs) 283.12 282 277.6  274.08 274.12 278  BMI 48.6 kg/m2 48.41 kg/m2 47.65 kg/m2  47.05 kg/m2 47.05 kg/m2 47.72 kg/m2      Latest Ref Rng & Units 07/02/2020   12:00 AM  Foot/eye exam completion dates  Eye Exam No Retinopathy No Retinopathy         This result is from an external source.

## 2022-04-16 NOTE — Assessment & Plan Note (Signed)
Controlled, no change in medication DASH diet and commitment to daily physical activity for a minimum of 30 minutes discussed and encouraged, as a part of hypertension management. The importance of attaining a healthy weight is also discussed.     04/09/2022    8:29 AM 04/08/2022    1:48 PM 01/21/2022    8:32 AM 11/25/2021    9:47 AM 11/25/2021    9:10 AM 08/12/2021   10:23 AM 06/10/2021    8:02 AM  BP/Weight  Systolic BP [id number]  Diastolic BP 76 71 82 72 82 73 79  Wt. (Lbs) 283.12 282 277.6  274.08 274.12 278  BMI 48.6 kg/m2 48.41 kg/m2 47.65 kg/m2  47.05 kg/m2 47.05 kg/m2 47.72 kg/m2

## 2022-04-16 NOTE — Assessment & Plan Note (Signed)
Hyperlipidemia:Low fat diet discussed and encouraged.   Lipid Panel  Lab Results  Component Value Date   CHOL 216 (H) 04/10/2022   HDL 55 04/10/2022   LDLCALC 139 (H) 04/10/2022   TRIG 121 04/10/2022   CHOLHDL 3.9 04/10/2022     Needs to reduce dietary fat

## 2022-04-16 NOTE — Assessment & Plan Note (Signed)
Start effexor and refer to Therapist

## 2022-04-21 ENCOUNTER — Ambulatory Visit: Payer: 59 | Admitting: Family Medicine

## 2022-04-24 NOTE — Addendum Note (Signed)
Addended by: Fayrene Helper on: 04/24/2022 12:38 AM   Modules accepted: Orders

## 2022-05-01 ENCOUNTER — Other Ambulatory Visit: Payer: Self-pay | Admitting: Family Medicine

## 2022-05-05 ENCOUNTER — Encounter: Payer: Self-pay | Admitting: Internal Medicine

## 2022-05-05 ENCOUNTER — Ambulatory Visit (INDEPENDENT_AMBULATORY_CARE_PROVIDER_SITE_OTHER): Payer: 59 | Admitting: Internal Medicine

## 2022-05-05 VITALS — BP 128/76 | HR 73 | Resp 16 | Ht 64.0 in | Wt 286.4 lb

## 2022-05-05 DIAGNOSIS — L03012 Cellulitis of left finger: Secondary | ICD-10-CM

## 2022-05-05 MED ORDER — DOXYCYCLINE HYCLATE 100 MG PO TABS: 100.0000 mg | ORAL_TABLET | Freq: Two times a day (BID) | ORAL | 0 refills | Status: DC

## 2022-05-05 NOTE — Patient Instructions (Signed)
Thank you, Ms.Tajha Vest for allowing [place] to provide your care today.   Doxycycline sent to your pharmacy for cellulitis of your left middle finger.   Reminders: Follow up if symptoms worsen or fail to improve     Tamsen Snider, M.D.

## 2022-05-05 NOTE — Progress Notes (Unsigned)
   HPI:Kathleen Reynolds is a 62 y.o. female who presents for evaluation of hand pain. In December she had her nails overfiled and it got warm and swollen but it went down.Then about 2 weeks ago she touched the side of a hot pot and didn't notice the burn (her fingers are always numb) until it blistered up a few days later. She sterilized a needle and popped it , clear and white fluid came out last Tuesday. It has been getting red and feels warm. For the details of today's visit, please refer to the assessment and plan.  Physical Exam: Vitals:   05/05/22 1448  BP: 128/76  Pulse: 73  Resp: 16  SpO2: 95%  Weight: 286 lb 6.4 oz (129.9 kg)  Height: 5\' 4"  (1.626 m)     Physical Exam Constitutional:      General: She is not in acute distress.    Appearance: She is not ill-appearing.  Cardiovascular:     Rate and Rhythm: Normal rate and regular rhythm.  Musculoskeletal:        General: Swelling present. Normal range of motion.  Skin:    Findings: Erythema present.     Comments: Left middle finger is hot, swollen, and has ulceration at tip         Assessment & Plan:   Kathleen Reynolds was seen today for hand pain.  Cellulitis of left middle finger Assessment & Plan: Initial blister and possible purulent  cellulitis. Review of hemoglobin A1c shows prediabetic on Metformin.  Take Doxycycline 100 mg BID for 7 days Follow up if symptoms worsen or fail to improve in the next 3 days. Follow up in a week for evaluation.    Orders: -     Doxycycline Hyclate; Take 1 tablet (100 mg total) by mouth 2 (two) times daily. 1 po bid  Dispense: 14 tablet; Refill: 0      Lorene Dy, MD

## 2022-05-06 DIAGNOSIS — L03012 Cellulitis of left finger: Secondary | ICD-10-CM | POA: Insufficient documentation

## 2022-05-06 NOTE — Assessment & Plan Note (Addendum)
Initial blister and possible purulent  cellulitis. Review of hemoglobin A1c shows prediabetic on Metformin.  Take Doxycycline 100 mg BID for 7 days Follow up if symptoms worsen or fail to improve in the next 3 days. Follow up in a week for evaluation.

## 2022-05-12 ENCOUNTER — Telehealth (HOSPITAL_BASED_OUTPATIENT_CLINIC_OR_DEPARTMENT_OTHER): Payer: Self-pay | Admitting: Pulmonary Disease

## 2022-05-12 DIAGNOSIS — G4733 Obstructive sleep apnea (adult) (pediatric): Secondary | ICD-10-CM

## 2022-05-12 NOTE — Telephone Encounter (Signed)
HST showed mild  OSA with AHI 12/ hr but severe desaturation as low as 72%.  Oxygen saturation stayed less than 90% for more than 3 hours.  Suggest CPAP titration study, this will also enable [place] to see if oxygen is required

## 2022-05-13 ENCOUNTER — Ambulatory Visit (INDEPENDENT_AMBULATORY_CARE_PROVIDER_SITE_OTHER): Payer: 59 | Admitting: Family Medicine

## 2022-05-13 ENCOUNTER — Encounter: Payer: Self-pay | Admitting: Family Medicine

## 2022-05-13 VITALS — BP 117/72 | HR 65 | Resp 16 | Ht 64.0 in | Wt 285.0 lb

## 2022-05-13 DIAGNOSIS — I10 Essential (primary) hypertension: Secondary | ICD-10-CM | POA: Diagnosis not present

## 2022-05-13 DIAGNOSIS — T23022D Burn of unspecified degree of single left finger (nail) except thumb, subsequent encounter: Secondary | ICD-10-CM

## 2022-05-13 DIAGNOSIS — F322 Major depressive disorder, single episode, severe without psychotic features: Secondary | ICD-10-CM

## 2022-05-13 DIAGNOSIS — T23022A Burn of unspecified degree of single left finger (nail) except thumb, initial encounter: Secondary | ICD-10-CM

## 2022-05-13 DIAGNOSIS — L03012 Cellulitis of left finger: Secondary | ICD-10-CM

## 2022-05-13 MED ORDER — SULFAMETHOXAZOLE-TRIMETHOPRIM 800-160 MG PO TABS: 1.0000 | ORAL_TABLET | Freq: Two times a day (BID) | ORAL | 0 refills | Status: DC

## 2022-05-13 NOTE — Progress Notes (Signed)
   Kathleen Reynolds     MRN: [id number]      DOB: 1961/01/22   HPI Kathleen Reynolds is here for follow up of burn to left middle finger burned on hot pot 3/17, still discolored and warm and tender, skin still healing, no drainage from area Had injury to same finger at nail shop in 01/2022, since then swollen  and tender ROS Denies recent fever or chills. Denies sinus pressure, nasal congestion, ear pain or sore throat. Denies chest congestion, productive cough or wheezing. Denies chest pains, palpitations and leg swelling Denies abdominal pain, nausea, vomiting,diarrhea or constipation.   Denies dysuria, frequency, hesitancy or incontinence. . Denies headaches, seizures, numbness, or tingling. C/o depression, anxiety awaiting appt   PE  BP 117/72   Pulse 65   Resp 16   Ht 5\' 4"  (1.626 m)   Wt 285 lb (129.3 kg)   LMP 03/27/2011   SpO2 93%   BMI 48.92 kg/m   Patient alert and oriented and in no cardiopulmonary distress.  HEENT: No facial asymmetry, EOMI,     Neck supple .  Chest: Clear to auscultation bilaterally.  CVS: S1, S2 no murmurs, no S3.Regular rate.  ABD: Soft non tender.   Ext: No edema  MS: Adequate ROM spine, shoulders, hips and knees.  Skin: dead skin in area of burn to left middle finger and blistered area still healing, no visible purulent drainage Psych: Good eye contact, normal affect. Memory intact not anxious or depressed appearing.  CNS: CN 2-12 intact, power,  normal throughout.no focal deficits noted.   Assessment & Plan  Cellulitis of left middle finger Persistent warmth tenderness and swelling refer Ortho, septra prescribed for an additional 7 days  Depression, major, single episode, severe (HCC) Awaiting appt with Psych  Essential hypertension Controlled, no change in medication   Burn of left middle finger Still experiencing joint and finger pain and swelling refer to Ortho

## 2022-05-13 NOTE — Patient Instructions (Addendum)
F/u as before , all if you need me sooner  One week of septra is prescribed  and you are referred to Dr Romeo Apple regarding the finger  I will send message for your therapy referral to continue the process  Expect a call from pulmonary re sleep study and next step  Thanks for choosing [facility], we consider it a privelige to serve you.

## 2022-05-14 NOTE — Telephone Encounter (Signed)
Called and spoke w/ pt she verbalized understanding. CPAP titration has been ordered. Closing encounter.

## 2022-05-18 ENCOUNTER — Encounter: Payer: Self-pay | Admitting: Family Medicine

## 2022-05-18 DIAGNOSIS — T23022A Burn of unspecified degree of single left finger (nail) except thumb, initial encounter: Secondary | ICD-10-CM | POA: Insufficient documentation

## 2022-05-18 NOTE — Assessment & Plan Note (Signed)
Still experiencing joint and finger pain and swelling refer to Ortho

## 2022-05-18 NOTE — Assessment & Plan Note (Signed)
Controlled, no change in medication  

## 2022-05-18 NOTE — Assessment & Plan Note (Signed)
Persistent warmth tenderness and swelling refer Ortho, septra prescribed for an additional 7 days

## 2022-05-18 NOTE — Assessment & Plan Note (Signed)
Awaiting appt with Psych

## 2022-05-25 ENCOUNTER — Ambulatory Visit: Payer: 59 | Admitting: Orthopedic Surgery

## 2022-05-28 DIAGNOSIS — I10 Essential (primary) hypertension: Secondary | ICD-10-CM | POA: Diagnosis not present

## 2022-05-28 DIAGNOSIS — Z833 Family history of diabetes mellitus: Secondary | ICD-10-CM | POA: Diagnosis not present

## 2022-05-28 DIAGNOSIS — Z8249 Family history of ischemic heart disease and other diseases of the circulatory system: Secondary | ICD-10-CM | POA: Diagnosis not present

## 2022-05-28 DIAGNOSIS — J309 Allergic rhinitis, unspecified: Secondary | ICD-10-CM | POA: Diagnosis not present

## 2022-05-28 DIAGNOSIS — R32 Unspecified urinary incontinence: Secondary | ICD-10-CM | POA: Diagnosis not present

## 2022-05-28 DIAGNOSIS — Z886 Allergy status to analgesic agent status: Secondary | ICD-10-CM | POA: Diagnosis not present

## 2022-05-28 DIAGNOSIS — F419 Anxiety disorder, unspecified: Secondary | ICD-10-CM | POA: Diagnosis not present

## 2022-05-28 DIAGNOSIS — Z809 Family history of malignant neoplasm, unspecified: Secondary | ICD-10-CM | POA: Diagnosis not present

## 2022-05-28 DIAGNOSIS — M1990 Unspecified osteoarthritis, unspecified site: Secondary | ICD-10-CM | POA: Diagnosis not present

## 2022-05-28 DIAGNOSIS — Z853 Personal history of malignant neoplasm of breast: Secondary | ICD-10-CM | POA: Diagnosis not present

## 2022-05-28 DIAGNOSIS — E119 Type 2 diabetes mellitus without complications: Secondary | ICD-10-CM | POA: Diagnosis not present

## 2022-05-28 DIAGNOSIS — E785 Hyperlipidemia, unspecified: Secondary | ICD-10-CM | POA: Diagnosis not present

## 2022-06-04 ENCOUNTER — Ambulatory Visit: Payer: 59 | Admitting: Family Medicine

## 2022-06-12 ENCOUNTER — Ambulatory Visit (HOSPITAL_COMMUNITY)
Admission: RE | Admit: 2022-06-12 | Discharge: 2022-06-12 | Disposition: A | Discharge status: Home / Self Care | Payer: 59 | Source: Ambulatory Visit | Attending: Family Medicine | Admitting: Family Medicine

## 2022-06-12 ENCOUNTER — Ambulatory Visit (INDEPENDENT_AMBULATORY_CARE_PROVIDER_SITE_OTHER): Payer: 59 | Admitting: Family Medicine

## 2022-06-12 ENCOUNTER — Encounter: Payer: Self-pay | Admitting: Family Medicine

## 2022-06-12 VITALS — BP 124/69 | HR 64 | Ht 64.0 in | Wt 286.0 lb

## 2022-06-12 DIAGNOSIS — M79645 Pain in left finger(s): Secondary | ICD-10-CM | POA: Diagnosis not present

## 2022-06-12 DIAGNOSIS — E785 Hyperlipidemia, unspecified: Secondary | ICD-10-CM | POA: Diagnosis not present

## 2022-06-12 DIAGNOSIS — R7303 Prediabetes: Secondary | ICD-10-CM | POA: Diagnosis not present

## 2022-06-12 DIAGNOSIS — L03012 Cellulitis of left finger: Secondary | ICD-10-CM | POA: Diagnosis not present

## 2022-06-12 DIAGNOSIS — T23022D Burn of unspecified degree of single left finger (nail) except thumb, subsequent encounter: Secondary | ICD-10-CM

## 2022-06-12 DIAGNOSIS — G4489 Other headache syndrome: Secondary | ICD-10-CM

## 2022-06-12 DIAGNOSIS — T23022A Burn of unspecified degree of single left finger (nail) except thumb, initial encounter: Secondary | ICD-10-CM

## 2022-06-12 MED ORDER — BUTALBITAL-APAP-CAFFEINE 50-325-40 MG PO TABS: 1.0000 | ORAL_TABLET | Freq: Four times a day (QID) | ORAL | 0 refills | Status: DC | PRN

## 2022-06-12 MED ORDER — MONTELUKAST SODIUM 10 MG PO TABS: 10.0000 mg | ORAL_TABLET | Freq: Every day | ORAL | 3 refills | Status: DC

## 2022-06-12 MED ORDER — PHENTERMINE HCL 37.5 MG PO TABS: ORAL_TABLET | ORAL | 1 refills | Status: DC

## 2022-06-12 NOTE — Assessment & Plan Note (Signed)
Patient educated about the importance of limiting  Carbohydrate intake , the need to commit to daily physical activity for a minimum of 30 minutes , and to commit weight loss. The fact that changes in all these areas will reduce or eliminate all together the development of diabetes is stressed.      Latest Ref Rng & Units 04/10/2022    9:35 AM 01/07/2022   11:25 AM 11/27/2021    8:20 AM 06/13/2021    8:34 AM 02/27/2021    8:10 AM  Diabetic Labs  HbA1c 4.8 - 5.6 % 6.0   5.9  6.1    Chol 100 - 199 mg/dL 161   096  045    HDL >40 mg/dL 55   48  59    Calc LDL 0 - 99 mg/dL 981   191  478    Triglycerides 0 - 149 mg/dL 295   621  308    Creatinine 0.57 - 1.00 mg/dL 6.57  8.46  9.62  9.52  0.72       06/12/2022    8:05 AM 05/13/2022   10:57 AM 05/05/2022    2:48 PM 04/09/2022    8:29 AM 04/08/2022    1:48 PM 01/21/2022    8:32 AM 11/25/2021    9:47 AM  BP/Weight  Systolic BP 124 117 128 116 130 136 128  Diastolic BP 69 72 76 76 71 82 72  Wt. (Lbs) 286.04 285 286.4 283.12 282 277.6   BMI 49.1 kg/m2 48.92 kg/m2 49.16 kg/m2 48.6 kg/m2 48.41 kg/m2 47.65 kg/m2       Latest Ref Rng & Units 07/02/2020   12:00 AM  Foot/eye exam completion dates  Eye Exam No Retinopathy No Retinopathy         This result is from an external source.    Updated lab needed at/ before next visit.

## 2022-06-12 NOTE — Progress Notes (Unsigned)
Kathleen Reynolds     MRN: [id number]      DOB: 1960-05-14  Chief Complaint  Patient presents with   Follow-up    Follow up    HPI Kathleen Reynolds is here for follow up and re-evaluation of chronic medical conditions, medication management and review of any available recent lab and radiology data.  Preventive health is updated, specifically  Cancer screening and Immunization.   Had $100 co paY FOR oRTHO DID NOT KEEP APPT. TThe PT denies any adverse reactions to current medications since the last visit.  There are no new concerns.  There are no specific complaints   ROS Denies recent fever or chills. Denies sinus pressure, nasal congestion, ear pain or sore throat. Denies chest congestion, productive cough or wheezing. Denies chest pains, palpitations and leg swelling Denies abdominal pain, nausea, vomiting,diarrhea or constipation.   Denies dysuria, frequency, hesitancy or incontinence. Denies joint pain, swelling and limitation in mobility. Denies headaches, seizures, numbness, or tingling. Denies depression, anxiety or insomnia. SKIN RED AND BLISTERED , BUT IMPROVING WUITH TIMER ON FINGERTIP[ PE  BP 124/69 (BP Location: Right Arm, Patient Position: Sitting, Cuff Size: Large)   Pulse 64   Ht 5\' 4"  (1.626 m)   Wt 286 lb 0.6 oz (129.7 kg)   LMP 03/27/2011   SpO2 95%   BMI 49.10 kg/m   Patient alert and oriented and in no cardiopulmonary distress.  HEENT: No facial asymmetry, EOMI,     Neck supple .  Chest: Clear to auscultation bilaterally.  CVS: S1, S2 no murmurs, no S3.Regular rate.  ABD: Soft non tender.   Ext: No edema  MS: Adequate ROM spine, shoulders, hips and knees.  Skin: Intact, no ulcerations or rash noted.  Psych: Good eye contact, normal affect. Memory intact not anxious or depressed appearing.  CNS: CN 2-12 intact, power,  normal throughout.no focal deficits noted.   Assessment & Plan  Obesity, morbid, BMI 40.0-49.9 (HCC)  Patient  re-educated about  the importance of commitment to a  minimum of 150 minutes of exercise per week as able.  The importance of healthy food choices with portion control discussed, as well as eating regularly and within a 12 hour window most days. The need to choose "clean , green" food 50 to 75% of the time is discussed, as well as to make water the primary drink and set a goal of 64 ounces water daily.       06/12/2022    8:05 AM 05/13/2022   10:57 AM 05/05/2022    2:48 PM  Weight /BMI  Weight 286 lb 0.6 oz 285 lb 286 lb 6.4 oz  Height 5\' 4"  (1.626 m) 5\' 4"  (1.626 m) 5\' 4"  (1.626 m)  BMI 49.1 kg/m2 48.92 kg/m2 49.16 kg/m2    No change needs to re commit to food choice, 10 pound weight loss target in 4 months  Prediabetes Patient educated about the importance of limiting  Carbohydrate intake , the need to commit to daily physical activity for a minimum of 30 minutes , and to commit weight loss. The fact that changes in all these areas will reduce or eliminate all together the development of diabetes is stressed.      Latest Ref Rng & Units 04/10/2022    9:35 AM 01/07/2022   11:25 AM 11/27/2021    8:20 AM 06/13/2021    8:34 AM 02/27/2021    8:10 AM  Diabetic Labs  HbA1c 4.8 - 5.6 % 6.0  5.9  6.1    Chol 100 - 199 mg/dL 742   595  638    HDL >75 mg/dL 55   48  59    Calc LDL 0 - 99 mg/dL 643   329  518    Triglycerides 0 - 149 mg/dL 841   660  630    Creatinine 0.57 - 1.00 mg/dL 1.60  1.09  3.23  5.57  0.72       06/12/2022    8:05 AM 05/13/2022   10:57 AM 05/05/2022    2:48 PM 04/09/2022    8:29 AM 04/08/2022    1:48 PM 01/21/2022    8:32 AM 11/25/2021    9:47 AM  BP/Weight  Systolic BP 124 117 128 116 130 136 128  Diastolic BP 69 72 76 76 71 82 72  Wt. (Lbs) 286.04 285 286.4 283.12 282 277.6   BMI 49.1 kg/m2 48.92 kg/m2 49.16 kg/m2 48.6 kg/m2 48.41 kg/m2 47.65 kg/m2       Latest Ref Rng & Units 07/02/2020   12:00 AM  Foot/eye exam completion dates  Eye Exam No Retinopathy No  Retinopathy         This result is from an external source.    Updated lab needed at/ before next visit.   Hyperlipidemia LDL goal <100 Hyperlipidemia:Low fat diet discussed and encouraged.   Lipid Panel  Lab Results  Component Value Date   CHOL 216 (H) 04/10/2022   HDL 55 04/10/2022   LDLCALC 139 (H) 04/10/2022   TRIG 121 04/10/2022   CHOLHDL 3.9 04/10/2022     Needs to reduce fried and fatty  food  Headache Controlled on current management , continue same  Cellulitis of left middle finger Improved, no evidence of infectiion, blister/ bruise resolving slowly, x ray digi since was unable to see Ortho  Burn of left middle finger Eschr lifting off to be fall off eventually, healing slowly

## 2022-06-12 NOTE — Assessment & Plan Note (Signed)
  Patient re-educated about  the importance of commitment to a  minimum of 150 minutes of exercise per week as able.  The importance of healthy food choices with portion control discussed, as well as eating regularly and within a 12 hour window most days. The need to choose "clean , green" food 50 to 75% of the time is discussed, as well as to make water the primary drink and set a goal of 64 ounces water daily.       06/12/2022    8:05 AM 05/13/2022   10:57 AM 05/05/2022    2:48 PM  Weight /BMI  Weight 286 lb 0.6 oz 285 lb 286 lb 6.4 oz  Height 5\' 4"  (1.626 m) 5\' 4"  (1.626 m) 5\' 4"  (1.626 m)  BMI 49.1 kg/m2 48.92 kg/m2 49.16 kg/m2    No change needs to re commit to food choice, 10 pound weight loss target in 4 months

## 2022-06-12 NOTE — Patient Instructions (Signed)
F/U end August, reschedule July appointment  Work on foood choice, and drink only water    Target weight loss of 10 pounds   Pls get X ray of finger today  It is important that you exercise regularly at least 30 minutes 5 times a week. If you develop chest pain, have severe difficulty breathing, or feel very tired, stop exercising immediately and seek medical attention   Think about what you will eat, plan ahead. Choose " clean, green, fresh or frozen" over canned, processed or packaged foods which are more sugary, salty and fatty. 70 to 75% of food eaten should be vegetables and fruit. Three meals at set times with snacks allowed between meals, but they must be fruit or vegetables. Aim to eat over a 12 hour period , example 7 am to 7 pm, and STOP after  your last meal of the day. Drink water,generally about 64 ounces per day, no other drink is as healthy. Fruit juice is best enjoyed in a healthy way, by EATING the fruit. Thanks for choosing [facility], we consider it a privelige to serve you.

## 2022-06-14 NOTE — Assessment & Plan Note (Signed)
Eschr lifting off to be fall off eventually, healing slowly

## 2022-06-14 NOTE — Assessment & Plan Note (Signed)
Controlled on current management , continue same

## 2022-06-14 NOTE — Assessment & Plan Note (Signed)
Improved, no evidence of infectiion, blister/ bruise resolving slowly, x ray digi since was unable to see Ortho

## 2022-06-14 NOTE — Assessment & Plan Note (Signed)
Hyperlipidemia:Low fat diet discussed and encouraged.   Lipid Panel  Lab Results  Component Value Date   CHOL 216 (H) 04/10/2022   HDL 55 04/10/2022   LDLCALC 139 (H) 04/10/2022   TRIG 121 04/10/2022   CHOLHDL 3.9 04/10/2022     Needs to reduce fried and fatty  food

## 2022-07-08 ENCOUNTER — Ambulatory Visit (HOSPITAL_COMMUNITY): Payer: 59 | Admitting: Psychiatry

## 2022-07-08 ENCOUNTER — Encounter (HOSPITAL_COMMUNITY): Payer: Self-pay

## 2022-07-14 ENCOUNTER — Encounter: Payer: 59 | Admitting: Pulmonary Disease

## 2022-09-14 ENCOUNTER — Telehealth: Payer: Self-pay | Admitting: *Deleted

## 2022-09-14 ENCOUNTER — Encounter: Payer: Self-pay | Admitting: *Deleted

## 2022-09-14 NOTE — Telephone Encounter (Signed)
I attempted to contact patient by telephone but was unsuccessful. According to the patient's chart they are due for follow up with  [facility]. I have left a HIPAA compliant message advising the patient to contact [facility] at [phone]. I will continue to follow up with the patient to make sure this appointment is scheduled.

## 2022-10-02 ENCOUNTER — Ambulatory Visit: Payer: 59 | Admitting: Family Medicine

## 2022-10-09 ENCOUNTER — Ambulatory Visit (HOSPITAL_COMMUNITY): Payer: Commercial Managed Care - HMO

## 2022-10-16 ENCOUNTER — Ambulatory Visit (HOSPITAL_COMMUNITY)
Admission: RE | Admit: 2022-10-16 | Discharge: 2022-10-16 | Disposition: A | Discharge status: Home / Self Care | Payer: Commercial Managed Care - HMO | Source: Ambulatory Visit | Attending: Physician Assistant | Admitting: Physician Assistant

## 2022-10-16 DIAGNOSIS — Z1509 Genetic susceptibility to other malignant neoplasm: Secondary | ICD-10-CM | POA: Insufficient documentation

## 2022-10-16 DIAGNOSIS — Z1502 Genetic susceptibility to malignant neoplasm of ovary: Secondary | ICD-10-CM | POA: Diagnosis present

## 2022-10-16 DIAGNOSIS — Z1501 Genetic susceptibility to malignant neoplasm of breast: Secondary | ICD-10-CM | POA: Diagnosis present

## 2022-10-16 MED ORDER — GADOBUTROL 1 MMOL/ML IV SOLN
10.0000 mL | Freq: Once | INTRAVENOUS | Status: AC | PRN
  Administered 2022-10-16: 10 mL via INTRAVENOUS

## 2022-11-18 ENCOUNTER — Encounter: Payer: Self-pay | Admitting: Family Medicine

## 2022-11-18 ENCOUNTER — Ambulatory Visit: Payer: Managed Care, Other (non HMO) | Admitting: Family Medicine

## 2022-11-18 VITALS — BP 138/78 | HR 76 | Temp 98.4°F | Ht 64.0 in | Wt 282.0 lb

## 2022-11-18 DIAGNOSIS — J029 Acute pharyngitis, unspecified: Secondary | ICD-10-CM | POA: Insufficient documentation

## 2022-11-18 DIAGNOSIS — R7303 Prediabetes: Secondary | ICD-10-CM | POA: Diagnosis not present

## 2022-11-18 DIAGNOSIS — E785 Hyperlipidemia, unspecified: Secondary | ICD-10-CM

## 2022-11-18 DIAGNOSIS — I10 Essential (primary) hypertension: Secondary | ICD-10-CM | POA: Diagnosis not present

## 2022-11-18 DIAGNOSIS — E559 Vitamin D deficiency, unspecified: Secondary | ICD-10-CM

## 2022-11-18 DIAGNOSIS — J309 Allergic rhinitis, unspecified: Secondary | ICD-10-CM

## 2022-11-18 MED ORDER — AZELASTINE HCL 0.1 % NA SOLN: 2.0000 | Freq: Two times a day (BID) | NASAL | 12 refills | Status: AC

## 2022-11-18 MED ORDER — AZITHROMYCIN 250 MG PO TABS: ORAL_TABLET | ORAL | 0 refills | Status: AC

## 2022-11-18 MED ORDER — OLMESARTAN MEDOXOMIL 20 MG PO TABS: 20.0000 mg | ORAL_TABLET | Freq: Every day | ORAL | 3 refills | Status: DC

## 2022-11-18 NOTE — Assessment & Plan Note (Signed)
Uncontrolled, non compliant Needs to take med , re eval in 6 to 8 weeks DASH diet and commitment to daily physical activity for a minimum of 30 minutes discussed and encouraged, as a part of hypertension management. The importance of attaining a healthy weight is also discussed.     11/18/2022    2:46 PM 06/12/2022    8:05 AM 05/13/2022   10:57 AM 05/05/2022    2:48 PM 04/09/2022    8:29 AM 04/08/2022    1:48 PM 01/21/2022    8:32 AM  BP/Weight  Systolic BP 138 124 117 128 116 130 136  Diastolic BP 78 69 72 76 76 71 82  Wt. (Lbs) 282.04 286.04 285 286.4 283.12 282 277.6  BMI 48.41 kg/m2 49.1 kg/m2 48.92 kg/m2 49.16 kg/m2 48.6 kg/m2 48.41 kg/m2 47.65 kg/m2

## 2022-11-18 NOTE — Assessment & Plan Note (Signed)
Rapid strep negative, z pack prescribed

## 2022-11-18 NOTE — Assessment & Plan Note (Signed)
  Patient re-educated about  the importance of commitment to a  minimum of 150 minutes of exercise per week as able.  The importance of healthy food choices with portion control discussed, as well as eating regularly and within a 12 hour window most days. The need to choose "clean , green" food 50 to 75% of the time is discussed, as well as to make water the primary drink and set a goal of 64 ounces water daily.       11/18/2022    2:46 PM 06/12/2022    8:05 AM 05/13/2022   10:57 AM  Weight /BMI  Weight 282 lb 0.6 oz 286 lb 0.6 oz 285 lb  Height 5\' 4"  (1.626 m) 5\' 4"  (1.626 m) 5\' 4"  (1.626 m)  BMI 48.41 kg/m2 49.1 kg/m2 48.92 kg/m2

## 2022-11-18 NOTE — Patient Instructions (Addendum)
F/u in 6 weeks re evaluate blood pressure, call if you need me sooner  Rapid strep test in office today  Z pack and astellin are prescribed   Need to take BP meds as prescribed, olmesartan 20 mg one daily  Labs today lipid, cmp and eGFr, hBA1C and vit D  Need pap smear states overdue, pls sched appt with Gyne  Thanks for choosing [facility], we consider it a privelige to serve you.

## 2022-11-18 NOTE — Progress Notes (Signed)
Kathleen Reynolds     MRN: [id number]      DOB: [birth year]  Chief Complaint  Patient presents with   Sore Throat    Sore throat slight headache started 11/17/22    HPI Kathleen Reynolds is here for follow up and re-evaluation of chronic medical conditions, medication management and review of any available recent lab and radiology data.  Preventive health is updated, specifically  Cancer screening and Immunization.  C/o 2 day h/o sore throat and loss of voice  ROS Denies recent fever or chills. Denies chest congestion, productive cough or wheezing. Denies chest pains, palpitations and leg swelling Denies abdominal pain, nausea, vomiting,diarrhea or constipation.   Denies dysuria, frequency, hesitancy or incontinence. Denies joint pain, swelling and limitation in mobility. . Recently lost a sibling in past month and dealing with that mentally and emotionally. Denies skin break down or rash.   PE  BP 138/78 (BP Location: Right Arm, Patient Position: Sitting, Cuff Size: Large)   Pulse 76   Temp 98.4 F (36.9 C) (Oral)   Ht 5\' 4"  (1.626 m)   Wt 282 lb 0.6 oz (127.9 kg)   LMP 03/27/2011   SpO2 90%   BMI 48.41 kg/m   Patient alert and oriented and in no cardiopulmonary distress.ill appearing ,nasal congestion noted  HEENT: No facial asymmetry, EOMI,     Neck supple .right cervical adenitis, erythema of oropharynx, no exudate  Chest: Clear to auscultation bilaterally.  CVS: S1, S2 no murmurs, no S3.Regular rate.  ABD: Soft non tender.   Ext: No edema  MS: decreased  ROM spine, shoulders, hips and knees.  Skin: Intact, no ulcerations or rash noted.  Psych: Good eye contact, mildly  depressed appearing.  CNS: CN 2-12 intact, power,  normal throughout.no focal deficits noted.   Assessment & Plan  Sore throat Rapid strep negative, z pack prescribed  Essential hypertension Uncontrolled, non compliant Needs to take med , re eval in 6 to 8 weeks DASH diet and  commitment to daily physical activity for a minimum of 30 minutes discussed and encouraged, as a part of hypertension management. The importance of attaining a healthy weight is also discussed.     11/18/2022    2:46 PM 06/12/2022    8:05 AM 05/13/2022   10:57 AM 05/05/2022    2:48 PM 04/09/2022    8:29 AM 04/08/2022    1:48 PM 01/21/2022    8:32 AM  BP/Weight  Systolic BP 138 124 117 128 116 130 136  Diastolic BP 78 69 72 76 76 71 82  Wt. (Lbs) 282.04 286.04 285 286.4 283.12 282 277.6  BMI 48.41 kg/m2 49.1 kg/m2 48.92 kg/m2 49.16 kg/m2 48.6 kg/m2 48.41 kg/m2 47.65 kg/m2       Hyperlipidemia LDL goal <100 Hyperlipidemia:Low fat diet discussed and encouraged.   Lipid Panel  Lab Results  Component Value Date   CHOL 216 (H) 04/10/2022   HDL 55 04/10/2022   LDLCALC 139 (H) 04/10/2022   TRIG 121 04/10/2022   CHOLHDL 3.9 04/10/2022     Updated lab needed at/ before next visit.   Allergic rhinitis Uncontrolled , needs to commit to daily nasal spray  Obesity, morbid, BMI 40.0-49.9 (HCC)  Patient re-educated about  the importance of commitment to a  minimum of 150 minutes of exercise per week as able.  The importance of healthy food choices with portion control discussed, as well as eating regularly and within a 12 hour window most days. The need to  choose "clean , green" food 50 to 75% of the time is discussed, as well as to make water the primary drink and set a goal of 64 ounces water daily.       11/18/2022    2:46 PM 06/12/2022    8:05 AM 05/13/2022   10:57 AM  Weight /BMI  Weight 282 lb 0.6 oz 286 lb 0.6 oz 285 lb  Height 5\' 4"  (1.626 m) 5\' 4"  (1.626 m) 5\' 4"  (1.626 m)  BMI 48.41 kg/m2 49.1 kg/m2 48.92 kg/m2      Vitamin D deficiency Updated lab needed at/ before next visit.

## 2022-11-18 NOTE — Assessment & Plan Note (Signed)
Updated lab needed at/ before next visit.   

## 2022-11-18 NOTE — Assessment & Plan Note (Signed)
Uncontrolled , needs to commit to daily nasal spray

## 2022-11-18 NOTE — Assessment & Plan Note (Signed)
Hyperlipidemia:Low fat diet discussed and encouraged.   Lipid Panel  Lab Results  Component Value Date   CHOL 216 (H) 04/10/2022   HDL 55 04/10/2022   LDLCALC 139 (H) 04/10/2022   TRIG 121 04/10/2022   CHOLHDL 3.9 04/10/2022     Updated lab needed at/ before next visit.

## 2022-11-19 LAB — VITAMIN D 25 HYDROXY (VIT D DEFICIENCY, FRACTURES): Vit D, 25-Hydroxy: 31.9 ng/mL (ref 30.0–100.0)

## 2022-11-19 LAB — CMP14+EGFR
ALT: 19 [IU]/L (ref 0–32)
AST: 19 [IU]/L (ref 0–40)
Albumin: 4.4 g/dL (ref 3.9–4.9)
Alkaline Phosphatase: 79 [IU]/L (ref 44–121)
BUN/Creatinine Ratio: 16 (ref 12–28)
BUN: 12 mg/dL (ref 8–27)
Bilirubin Total: 0.3 mg/dL (ref 0.0–1.2)
CO2: 22 mmol/L (ref 20–29)
Calcium: 9.3 mg/dL (ref 8.7–10.3)
Chloride: 105 mmol/L (ref 96–106)
Creatinine, Ser: 0.73 mg/dL (ref 0.57–1.00)
Globulin, Total: 3 g/dL (ref 1.5–4.5)
Glucose: 78 mg/dL (ref 70–99)
Potassium: 4.2 mmol/L (ref 3.5–5.2)
Sodium: 142 mmol/L (ref 134–144)
Total Protein: 7.4 g/dL (ref 6.0–8.5)
eGFR: 93 mL/min/{1.73_m2} (ref >59)

## 2022-11-19 LAB — LIPID PANEL
Chol/HDL Ratio: 4.2 ratio (ref 0.0–4.4)
Cholesterol, Total: 220 mg/dL — ABNORMAL HIGH (ref 100–199)
HDL: 53 mg/dL
LDL Chol Calc (NIH): 141 mg/dL — ABNORMAL HIGH (ref 0–99)
Triglycerides: 146 mg/dL (ref 0–149)
VLDL Cholesterol Cal: 26 mg/dL (ref 5–40)

## 2022-11-19 LAB — HEMOGLOBIN A1C
Est. average glucose Bld gHb Est-mCnc: 128 mg/dL
Hgb A1c MFr Bld: 6.1 % — ABNORMAL HIGH (ref 4.8–5.6)

## 2023-01-14 ENCOUNTER — Ambulatory Visit: Payer: Managed Care, Other (non HMO) | Admitting: Family Medicine

## 2023-03-08 ENCOUNTER — Other Ambulatory Visit: Payer: Self-pay | Admitting: Family Medicine

## 2023-03-18 ENCOUNTER — Other Ambulatory Visit: Payer: Self-pay | Admitting: Family Medicine

## 2023-04-05 ENCOUNTER — Ambulatory Visit (HOSPITAL_COMMUNITY)
Admission: RE | Admit: 2023-04-05 | Discharge: 2023-04-05 | Disposition: A | Discharge status: Home / Self Care | Payer: 59 | Source: Ambulatory Visit | Attending: Physician Assistant | Admitting: Physician Assistant

## 2023-04-05 ENCOUNTER — Inpatient Hospital Stay: Payer: Commercial Managed Care - HMO | Attending: Physician Assistant

## 2023-04-05 ENCOUNTER — Encounter (HOSPITAL_COMMUNITY): Payer: Self-pay

## 2023-04-05 DIAGNOSIS — Z1501 Genetic susceptibility to malignant neoplasm of breast: Secondary | ICD-10-CM | POA: Diagnosis not present

## 2023-04-05 DIAGNOSIS — Z923 Personal history of irradiation: Secondary | ICD-10-CM | POA: Insufficient documentation

## 2023-04-05 DIAGNOSIS — Z1502 Genetic susceptibility to malignant neoplasm of ovary: Secondary | ICD-10-CM | POA: Diagnosis not present

## 2023-04-05 DIAGNOSIS — Z1509 Genetic susceptibility to other malignant neoplasm: Secondary | ICD-10-CM | POA: Insufficient documentation

## 2023-04-05 DIAGNOSIS — Z800 Family history of malignant neoplasm of digestive organs: Secondary | ICD-10-CM | POA: Insufficient documentation

## 2023-04-05 DIAGNOSIS — Z171 Estrogen receptor negative status [ER-]: Secondary | ICD-10-CM | POA: Diagnosis not present

## 2023-04-05 DIAGNOSIS — Z9012 Acquired absence of left breast and nipple: Secondary | ICD-10-CM | POA: Insufficient documentation

## 2023-04-05 DIAGNOSIS — C50912 Malignant neoplasm of unspecified site of left female breast: Secondary | ICD-10-CM | POA: Diagnosis not present

## 2023-04-05 DIAGNOSIS — Z1231 Encounter for screening mammogram for malignant neoplasm of breast: Secondary | ICD-10-CM | POA: Insufficient documentation

## 2023-04-05 DIAGNOSIS — Z801 Family history of malignant neoplasm of trachea, bronchus and lung: Secondary | ICD-10-CM | POA: Insufficient documentation

## 2023-04-05 DIAGNOSIS — Z853 Personal history of malignant neoplasm of breast: Secondary | ICD-10-CM | POA: Insufficient documentation

## 2023-04-08 ENCOUNTER — Inpatient Hospital Stay

## 2023-04-08 DIAGNOSIS — Z9012 Acquired absence of left breast and nipple: Secondary | ICD-10-CM | POA: Diagnosis not present

## 2023-04-08 DIAGNOSIS — Z923 Personal history of irradiation: Secondary | ICD-10-CM | POA: Diagnosis not present

## 2023-04-08 DIAGNOSIS — Z1501 Genetic susceptibility to malignant neoplasm of breast: Secondary | ICD-10-CM

## 2023-04-08 DIAGNOSIS — C50911 Malignant neoplasm of unspecified site of right female breast: Secondary | ICD-10-CM

## 2023-04-08 DIAGNOSIS — Z801 Family history of malignant neoplasm of trachea, bronchus and lung: Secondary | ICD-10-CM | POA: Diagnosis not present

## 2023-04-08 DIAGNOSIS — Z1502 Genetic susceptibility to malignant neoplasm of ovary: Secondary | ICD-10-CM | POA: Diagnosis not present

## 2023-04-08 DIAGNOSIS — Z1509 Genetic susceptibility to other malignant neoplasm: Secondary | ICD-10-CM | POA: Diagnosis not present

## 2023-04-08 DIAGNOSIS — C50912 Malignant neoplasm of unspecified site of left female breast: Secondary | ICD-10-CM

## 2023-04-08 DIAGNOSIS — Z800 Family history of malignant neoplasm of digestive organs: Secondary | ICD-10-CM | POA: Diagnosis not present

## 2023-04-08 DIAGNOSIS — Z853 Personal history of malignant neoplasm of breast: Secondary | ICD-10-CM | POA: Diagnosis present

## 2023-04-08 LAB — COMPREHENSIVE METABOLIC PANEL
ALT: 21 U/L (ref 0–44)
AST: 20 U/L (ref 15–41)
Albumin: 4 g/dL (ref 3.5–5.0)
Alkaline Phosphatase: 54 U/L (ref 38–126)
Anion gap: 4 — ABNORMAL LOW (ref 5–15)
BUN: 12 mg/dL (ref 8–23)
CO2: 26 mmol/L (ref 22–32)
Calcium: 9.1 mg/dL (ref 8.9–10.3)
Chloride: 108 mmol/L (ref 98–111)
Creatinine, Ser: 0.67 mg/dL (ref 0.44–1.00)
GFR, Estimated: >60 mL/min (ref >60)
Glucose, Bld: 100 mg/dL — ABNORMAL HIGH (ref 70–99)
Potassium: 3.8 mmol/L (ref 3.5–5.1)
Sodium: 138 mmol/L (ref 135–145)
Total Bilirubin: 0.5 mg/dL (ref 0.0–1.2)
Total Protein: 7.3 g/dL (ref 6.5–8.1)

## 2023-04-08 LAB — CBC WITH DIFFERENTIAL/PLATELET
Abs Immature Granulocytes: 0.01 10*3/uL (ref 0.00–0.07)
Basophils Absolute: 0 10*3/uL (ref 0.0–0.1)
Basophils Relative: 1 %
Eosinophils Absolute: 0.1 10*3/uL (ref 0.0–0.5)
Eosinophils Relative: 2 %
HCT: 40.6 % (ref 36.0–46.0)
Hemoglobin: 13.3 g/dL (ref 12.0–15.0)
Immature Granulocytes: 0 %
Lymphocytes Relative: 37 %
Lymphs Abs: 2.2 10*3/uL (ref 0.7–4.0)
MCH: 30.5 pg (ref 26.0–34.0)
MCHC: 32.8 g/dL (ref 30.0–36.0)
MCV: 93.1 fL (ref 80.0–100.0)
Monocytes Absolute: 0.3 10*3/uL (ref 0.1–1.0)
Monocytes Relative: 6 %
Neutro Abs: 3.4 10*3/uL (ref 1.7–7.7)
Neutrophils Relative %: 54 %
Platelets: 201 10*3/uL (ref 150–400)
RBC: 4.36 MIL/uL (ref 3.87–5.11)
RDW: 14.8 % (ref 11.5–15.5)
WBC: 6.1 10*3/uL (ref 4.0–10.5)
nRBC: 0 % (ref 0.0–0.2)

## 2023-04-08 LAB — VITAMIN D 25 HYDROXY (VIT D DEFICIENCY, FRACTURES): Vit D, 25-Hydroxy: 31.95 ng/mL (ref 30–100)

## 2023-04-10 NOTE — Progress Notes (Unsigned)
 [facility] 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, a 63 y.o. 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 Terasa was last seen in clinic by Rojelio Brenner PA-C on 04/08/2022.  At today's visit, she  reports feeling fair.  She denies any recent hospitalizations, surgeries, or changes in her  baseline health status.  She does report some increased fatigue this year, which she attributes to increasingly stressful life circumstances.  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  She reports chronic knee pain, but denies any new onset bone pain. She has not noticed any new lumps or bumps.  She denies any chest pain, dyspnea, or abdominal pain.  She has no new headaches (chronic migraines at baseline), seizures, or focal neurologic deficits.  No B symptoms such as fever, chills, night sweats, unintentional weight loss.  She reports 50% energy and 100% appetite.  She is maintaining stable weight at this time.  ASSESSMENT & PLAN:  1.  BILATERAL TRIPLE NEGATIVE  BREAST CANCER (s/p left mastectomy): - Patient was first diagnosed in 1999 with left sided breast cancer stage II grade 3. - Patient had a left mastectomy on 11/27/1997.  Showed a 2.3 cm primary mass with 7- lymph nodes.  BRCA 1 positive. - She was treated with AC x4 cycles followed by Taxol with 4 cycles. - Patient was diagnosed with right-sided triple negative breast cancer in 2013. - Dr. Claud Kelp performed a right lumpectomy with sentinel node biopsy on 02/13/2011.  It showed triple negative breast cancer with negative lymph nodes.  Her Ki-67 marker was 54%. - A right mastectomy was recommended at this time but she chose to have the lumpectomy instead. - She has now status post 6 cycles of carboplatin and Taxotere and status post radiation. - Physical exam at visit on 03/06/2021 was significant for small left axillary lymphadenopathy as well as hard fixed nodules beneath the right nipple.  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 - Diagnostic mammogram and ultrasound (03/25/2021): Benign right breast fat necrosis corresponding with patient's palpable lump.  Unremarkable ultrasound evaluation of left axilla. - Bilateral MRI breast (10/16/2022): No MRI evidence of malignancy in right breast or in left breast mastectomy site. - Most recent mammogram (04/05/2023): BI-RADS Category 1, negative - Labs (04/08/2023): Normal CBC.  CMP unremarkable.  Normal vitamin D. - Physical exam today (04/12/2023) is stable with again noted soft, rounded, freely mobile mass (known lipoma) inferior to left mastectomy scar, as well as fixed nodules beneath right nipple previously shown to be fat  necrosis. - No "red flag" symptoms and history/ROS  - PLAN: Since patient has BRCA1 positivity and has not had bilateral mastectomy, she will benefit from annual mammogram and annual breast MRI, staggered every 6 months.   MRI breast in September 2025 Unilateral screening  mammogram (right) in March 2025 - Labs and annual physical exam/office visit in 1 year.  2. BRCA 1 POSITIVE - S/p left-sided mastectomy - She was offered bilateral mastectomy in the past but declined at that time. - She has NOT undergone risk-reducing TAH/BSO, but is following with gynecology to discuss this. - She has never seen a Dentist.  (Declines at this time) - Family history of BRCA - several maternal cousins have BRCA mutation - Family history of cancer includes maternal aunt and multiple maternal cousins passed away from various cancers including breast cancer and 1 maternal cousin with pancreatic cancer - We have discussed some of the general implications of BRCA1 positivity, including the following: Cancers associated with BRCA1 in females are breast cancer, ovarian cancer, pancreatic cancer, and possibly uterine cancer (based on limited data).  She has NOT completed recommended risk reducing surgeries (bilateral mastectomy and TAH/BSO).  Guidelines currently only recommend imaging to screen for pancreatic cancer in patients who have BRCA positivity PLUS family history of pancreatic cancer.  Offered to reach out to GI to see if patient would qualify for pancreatic cancer screening based on extended family history of pancreatic cancer.  She declines at this time.  (If she changes her mind in the future, we will reach out to [facility]/Dr. Cirigliano to see if she meets criteria) Guidelines do not currently support annual breast MRI in patients who are s/p mastectomy.  However, since patient still has right breast, she will benefit from continued annual mammogram and annual right breast MRI.   Recommendation for first-degree relatives over age 74 to be tested for BRCA1 mutation  - PLAN: Patient educated to avoid hormonal supplements and medications. - Recommend referral to genetic counselor.  (Patient declines) - Recommend bilateral mastectomy and TAH/BSO.  (Patient declines at  this time, but is following with gynecology and will "think about it") - Consider pancreatic cancer screening via GI.  (Patient declines at this time) - Annual right breast mammogram and right breast MRI, staggered every 6 months - Continue GYN follow-up for risk of ovarian and endometrial cancer  3.  Bone health - DEXA scan done on 12/19/2018 showed T score of 0.6, which is normal - Most recent vitamin D (04/05/2023) is normal at 31.95 - PLAN: Continue calcium, vitamin D, and weightbearing exercises.  PLAN SUMMARY: >> Right-sided breast MRI in 6 months (due 10/17/2023) >> Unilateral (right) breast screening mammogram in 1 year (due 04/05/2024) >> Labs in 1 year = CBC/D, CMP, vitamin D >> OFFICE visit in 1 year (after mammogram and labs)    REVIEW OF SYSTEMS:   Review of Systems  Constitutional:  Positive for fatigue. Negative for appetite change, chills, diaphoresis, fever and unexpected weight change.  HENT:   Negative for lump/mass and nosebleeds.   Eyes:  Negative for eye problems.  Respiratory:  Positive for shortness of breath (with exertion). Negative for cough and hemoptysis.   Cardiovascular:  Negative for chest pain, leg swelling and palpitations.  Gastrointestinal:  Negative for abdominal pain, blood in stool, constipation, diarrhea, nausea and vomiting.  Genitourinary:  Negative for hematuria.   Skin: Negative.   Neurological:  Positive for headaches (migraines) and numbness (carpal tunnel). Negative for dizziness and  light-headedness.  Hematological:  Does not bruise/bleed easily.   PHYSICAL EXAM:   Performance status (ECOG): 1 - Symptomatic but completely ambulatory  Vitals:   04/12/23 1359  BP: 126/64  Pulse: 73  Resp: 18  Temp: 97.7 F (36.5 C)  SpO2: 99%   Wt Readings from Last 3 Encounters:  04/12/23 284 lb 3.2 oz (128.9 kg)  11/18/22 282 lb 0.6 oz (127.9 kg)  06/12/22 286 lb 0.6 oz (129.7 kg)   Physical Exam Constitutional:      Appearance: Normal  appearance. She is morbidly obese.  Cardiovascular:     Rate and Rhythm: Normal rate and regular rhythm.     Pulses: Normal pulses.     Heart sounds: Normal heart sounds.  Pulmonary:     Effort: Pulmonary effort is normal.     Breath sounds: Normal breath sounds.  Chest:  Breasts:    Right: Tenderness (right lateral breast tissue) present.     Left: No tenderness.    Lymphadenopathy:     Cervical: No cervical adenopathy.     Upper Body:     Right upper body: No supraclavicular, axillary or pectoral adenopathy.     Left upper body: No supraclavicular, axillary or pectoral adenopathy.  Neurological:     General: No focal deficit present.     Mental Status: She is alert and oriented to person, place, and time.  Psychiatric:        Mood and Affect: Mood normal.        Behavior: Behavior normal.      PAST MEDICAL/SURGICAL HISTORY:  Past Medical History:  Diagnosis Date   Anemia LIFELONG   HEAVY MENSES MAR 2012 HB 13 MCV 86.7 FERRITIN  66   Arthritis    Breast cancer (HCC)    Right   Breast mass in female    right   Colonic adenoma 02/05/2011   Diabetes mellitus without complication (HCC)    pre diabetic   Hyperlipidemia    Left rotator cuff tear    Migraines    Morbid obesity (HCC)    Personal history of chemotherapy    Personal history of radiation therapy    Pre-diabetes    RUPTURE ROTATOR CUFF 06/08/2007   Qualifier: Diagnosis of  By: Romeo Apple MD, Mliss Fritz, Achilles, right    Urticaria    Past Surgical History:  Procedure Laterality Date   ACHILLES TENDON SURGERY Right 09/22/2019   Procedure: RIGHT ACHILLES TENDON RECONSTRUCTION;  Surgeon: Nadara Mustard, MD;  Location: [facility];  Service: Orthopedics;  Laterality: Right;   BREAST BIOPSY  02/13/2011   Procedure: BREAST BIOPSY WITH NEEDLE LOCALIZATION;  Surgeon: Ernestene Mention, MD;  Location: [facility];  Service: General;  Laterality: Right;   BREAST LUMPECTOMY Right 2013   BREAST  SURGERY  11/1997   Left breast cancer-mastectomy   BREAST SURGERY  2/13-   rt axilly bx   COLONOSCOPY  2004 DR. SMITH   No polyp, hemorrhoids   COLONOSCOPY  06/09/10   [id number] adenomas    COLONOSCOPY N/A 08/19/2015   Procedure: COLONOSCOPY;  Surgeon: West Bali, MD;  [facility];  Service: Endoscopy;  Laterality: N/A;  130 - moved to 12:45 - Ginger notified pt   COLONOSCOPY WITH PROPOFOL N/A 11/29/2018   Fields: Past simple adenomas removed, diverticulosis, hemorrhoids.  Next colonoscopy in 3 years.   DILATION AND CURETTAGE OF UTERUS     DILATION AND CURETTAGE OF UTERUS  05/2002   hystersonogram  2009   MASTECTOMY  1999   left breast cancer    POLYPECTOMY  11/29/2018   Procedure: POLYPECTOMY;  Surgeon: West Bali, MD;  Location: [facility];  Service: Endoscopy;;  colon   Reynolds-A-CATH REMOVAL  10/20/2011   Procedure: MINOR REMOVAL Reynolds-A-CATH;  Surgeon: Ernestene Mention, MD;  Location: [facility];  Service: General;  Laterality: N/A;  Porta-cath removal  left   PORTACATH PLACEMENT  03/27/2011   Procedure: INSERTION Reynolds-A-CATH;  Surgeon: Ernestene Mention, MD;  Location: [facility];  Service: General;  Laterality: Left;  insert Reynolds a cath    SOCIAL HISTORY:  Social History   Socioeconomic History   Marital status: Single    Spouse name: Not on file   Number of children: Not on file   Years of education: Not on file   Highest education level: Not on file  Occupational History   Not on file  Tobacco Use   Smoking status: Never   Smokeless tobacco: Never  Vaping Use   Vaping status: Never Used  Substance and Sexual Activity   Alcohol use: Yes    Alcohol/week: 2.0 standard drinks of alcohol    Types: 2 Standard drinks or equivalent per week    Comment: occasional;    Drug use: No   Sexual activity: Yes    Birth control/protection: None  Other Topics Concern   Not on file  Social History Narrative   Single, 1 kid-63 yo  female-lives with mom   Employed as a CNA   Rare Etoh.   No tobacco products   Social Drivers of Corporate investment banker Strain: Not on file  Food Insecurity: Not on file  Transportation Needs: Not on file  Physical Activity: Not on file  Stress: Not on file  Social Connections: Not on file  Intimate Partner Violence: Not on file    FAMILY HISTORY:  Family History  Problem Relation Age of Onset   Hypertension Mother    Cancer Father        lung cancer   Diabetes Sister    Cancer Brother        colon cancer, younger than the age of 58   Heart disease Sister        heart attack   Colon polyps Sister    Asthma Sister    Eczema Sister    Allergic rhinitis Neg Hx    Urticaria Neg Hx     CURRENT MEDICATIONS:  Current Outpatient Medications  Medication Sig Dispense Refill   Ascorbic Acid (VITAMIN C) 500 MG CAPS Take 500 mg by mouth daily.      azelastine (ASTELIN) 0.1 % nasal spray Place 2 sprays into both nostrils 2 (two) times daily. Use in each nostril as directed 30 mL 12   Biotin w/ Vitamins C & E (HAIR/SKIN/NAILS PO) Take 2 tablets by mouth daily.     butalbital-acetaminophen-caffeine (FIORICET) 50-325-40 MG tablet Take 1 tablet by mouth every 6 (six) hours as needed for headache. 20 tablet 0   calcium carbonate (OSCAL) 1500 (600 Ca) MG TABS tablet Take 600 mg of elemental calcium by mouth 2 (two) times daily with a meal.     cyclobenzaprine (FLEXERIL) 10 MG tablet Take 1 tablet (10 mg total) by mouth 2 (two) times daily as needed for muscle spasms. 20 tablet 0   ELDERBERRY PO Take 1,000 mg by mouth daily.     fluticasone (FLONASE)  50 MCG/ACT nasal spray Place 2 sprays into both nostrils daily. 48 mL 1   meclizine (ANTIVERT) 25 MG tablet TAKE 1 TABLET BY MOUTH 3 TIMES A DAY AS NEEDED FOR DIZZINESS 30 tablet 2   metFORMIN (GLUCOPHAGE) 500 MG tablet Take 1 tablet (500 mg total) by mouth 2 (two) times daily with a meal. 180 tablet 3   Multiple Vitamin (MULITIVITAMIN WITH  MINERALS) TABS Take 1 tablet by mouth daily.     olmesartan (BENICAR) 20 MG tablet TAKE 1 TABLET BY MOUTH EVERY DAY 30 tablet 3   No current facility-administered medications for this visit.    ALLERGIES:  Allergies  Allergen Reactions   Nsaids Anaphylaxis    Ibuprofen required hospitalization in 2016 due to ibuprofen   Ibuprofen Itching    LABORATORY DATA:  I have reviewed the labs as listed.     Latest Ref Rng & Units 04/08/2023   12:13 PM 01/07/2022   11:25 AM 02/27/2021    8:10 AM  CBC  WBC 4.0 - 10.5 K/uL 6.1  6.8  4.3   Hemoglobin 12.0 - 15.0 g/dL 78.2  95.6  21.3   Hematocrit 36.0 - 46.0 % 40.6  39.3  40.1   Platelets 150 - 400 K/uL 201  200  169       Latest Ref Rng & Units 04/08/2023   12:13 PM 11/18/2022    3:59 PM 04/10/2022    9:35 AM  CMP  Glucose 70 - 99 mg/dL 086  78  94   BUN 8 - 23 mg/dL 12  12  14    Creatinine 0.44 - 1.00 mg/dL 5.78  4.69  6.29   Sodium 135 - 145 mmol/L 138  142  138   Potassium 3.5 - 5.1 mmol/L 3.8  4.2  4.2   Chloride 98 - 111 mmol/L 108  105  104   CO2 22 - 32 mmol/L 26  22  20    Calcium 8.9 - 10.3 mg/dL 9.1  9.3  9.0   Total Protein 6.5 - 8.1 g/dL 7.3  7.4  6.7   Total Bilirubin 0.0 - 1.2 mg/dL 0.5  0.3  0.3   Alkaline Phos 38 - 126 U/L 54  79  65   AST 15 - 41 U/L 20  19  16    ALT 0 - 44 U/L 21  19  17      DIAGNOSTIC IMAGING:  I have independently reviewed the scans and discussed with the patient. MM 3D SCREENING MAMMOGRAM UNILATERAL RIGHT BREAST Result Date: 04/08/2023 CLINICAL DATA:  Screening. EXAM: DIGITAL SCREENING UNILATERAL RIGHT MAMMOGRAM WITH CAD AND TOMOSYNTHESIS TECHNIQUE: Right screening digital craniocaudal and mediolateral oblique mammograms were obtained. Right screening digital breast tomosynthesis was performed. The images were evaluated with computer-aided detection. COMPARISON:  Previous exam(s). ACR Breast Density Category a: The breasts are almost entirely fatty. FINDINGS: There are no findings suspicious for  malignancy. IMPRESSION: No mammographic evidence of malignancy. A result letter of this screening mammogram will be mailed directly to the patient. RECOMMENDATION: Screening mammogram in one year. (Code:SM-B-01Y) BI-RADS CATEGORY  1: Negative. Electronically Signed   By: Edwin Cap M.D.   On: 04/08/2023 10:48     WRAP UP:  All questions were answered. The patient knows to call the clinic with any problems, questions or concerns.  Medical decision making: Moderate  Time spent on visit: I spent 20 minutes counseling the patient face to face. The total time spent in the appointment was 30 minutes and  more than 50% was on counseling.  Carnella Guadalajara, PA-C  04/12/23 2:44 PM

## 2023-04-12 ENCOUNTER — Inpatient Hospital Stay (HOSPITAL_BASED_OUTPATIENT_CLINIC_OR_DEPARTMENT_OTHER): Payer: Commercial Managed Care - HMO | Admitting: Physician Assistant

## 2023-04-12 ENCOUNTER — Ambulatory Visit: Payer: 59 | Admitting: Hematology

## 2023-04-12 VITALS — BP 126/64 | HR 73 | Temp 97.7°F | Resp 18 | Ht 64.0 in | Wt 284.2 lb

## 2023-04-12 DIAGNOSIS — C50912 Malignant neoplasm of unspecified site of left female breast: Secondary | ICD-10-CM

## 2023-04-12 DIAGNOSIS — Z171 Estrogen receptor negative status [ER-]: Secondary | ICD-10-CM

## 2023-04-12 DIAGNOSIS — Z853 Personal history of malignant neoplasm of breast: Secondary | ICD-10-CM | POA: Diagnosis not present

## 2023-04-12 DIAGNOSIS — Z1509 Genetic susceptibility to other malignant neoplasm: Secondary | ICD-10-CM

## 2023-04-12 DIAGNOSIS — C50911 Malignant neoplasm of unspecified site of right female breast: Secondary | ICD-10-CM | POA: Diagnosis not present

## 2023-04-12 DIAGNOSIS — Z1501 Genetic susceptibility to malignant neoplasm of breast: Secondary | ICD-10-CM | POA: Diagnosis not present

## 2023-04-12 DIAGNOSIS — Z1502 Genetic susceptibility to malignant neoplasm of ovary: Secondary | ICD-10-CM

## 2023-04-12 NOTE — Patient Instructions (Signed)
 [facility] at [facility] **VISIT SUMMARY & IMPORTANT INSTRUCTIONS **   You were seen today by Rojelio Brenner PA-C for your history of breast cancer.    HISTORY OF BREAST CANCER You do not have any evidence of returning breast cancer at this time.  BRCA 1 POSITIVE You are at increased risk for recurrent breast cancer, uterine/endometrial cancer, and ovarian cancer. Standard risk reducing surgeries includes bilateral mastectomy, hysterectomy, and removal of ovaries.   Continue to follow-up with your gynecologist regarding possible hysterectomy or ovary surgery.   If you decide that you would like to pursue full mastectomy, please let us know. Since you do still have your right breast, you qualify for annual breast MRI (staggered in between annual mammograms so that you are receiving imaging study every 6 months).  FOLLOW-UP APPOINTMENT: - MRI breast in September 2025 - Mammogram in March 2026 - Labs and annual office visit in 1 year (March 2026)  ** Thank you for trusting me with your healthcare!  I strive to provide all of my patients with quality care at each visit.  If you receive a survey for this visit, I would be so grateful to you for taking the time to provide feedback.  Thank you in advance!  ~ Libia Fazzini                   Dr. Doreatha Massed   &   Rojelio Brenner, PA-C   - - - - - - - - - - - - - - - - - -    Thank you for choosing [facility] at [facility] to provide your oncology and hematology care.  To afford each patient quality time with our provider, please arrive at least 15 minutes before your scheduled appointment time.   If you have a lab appointment with the Cancer Center please come in thru the Main Entrance and check in at the main information desk.  You need to re-schedule your appointment should you arrive 10 or more minutes late.  We strive to give you quality time with our providers, and arriving late  affects you and other patients whose appointments are after yours.  Also, if you no show three or more times for appointments you may be dismissed from the clinic at the providers discretion.     Again, thank you for choosing [facility].  Our hope is that these requests will decrease the amount of time that you wait before being seen by our physicians.       _____________________________________________________________  Should you have questions after your visit to [facility], please contact our office at [phone] and follow the prompts.  Our office hours are 8:00 a.m. and 4:30 p.m. Monday - Friday.  Please note that voicemails left after 4:00 p.m. may not be returned until the following business day.  We are closed weekends and major holidays.  You do have access to a nurse 24-7, just call the main number to the clinic [phone] and do not press any options, hold on the line and a nurse will answer the phone.    For prescription refill requests, have your pharmacy contact our office and allow 72 hours.

## 2023-04-20 ENCOUNTER — Encounter: Payer: Self-pay | Admitting: Family Medicine

## 2023-04-20 ENCOUNTER — Ambulatory Visit: Admitting: Family Medicine

## 2023-04-20 VITALS — BP 129/75 | HR 56 | Ht 64.0 in | Wt 282.1 lb

## 2023-04-20 DIAGNOSIS — Z23 Encounter for immunization: Secondary | ICD-10-CM | POA: Diagnosis not present

## 2023-04-20 DIAGNOSIS — R7303 Prediabetes: Secondary | ICD-10-CM

## 2023-04-20 DIAGNOSIS — M25561 Pain in right knee: Secondary | ICD-10-CM

## 2023-04-20 DIAGNOSIS — F322 Major depressive disorder, single episode, severe without psychotic features: Secondary | ICD-10-CM

## 2023-04-20 DIAGNOSIS — E785 Hyperlipidemia, unspecified: Secondary | ICD-10-CM | POA: Diagnosis not present

## 2023-04-20 DIAGNOSIS — M25562 Pain in left knee: Secondary | ICD-10-CM

## 2023-04-20 DIAGNOSIS — F411 Generalized anxiety disorder: Secondary | ICD-10-CM

## 2023-04-20 MED ORDER — ESCITALOPRAM OXALATE 10 MG PO TABS: 10.0000 mg | ORAL_TABLET | Freq: Every day | ORAL | 2 refills | Status: DC

## 2023-04-20 NOTE — Assessment & Plan Note (Addendum)
 Patient educated about the importance of limiting  Carbohydrate intake , the need to commit to daily physical activity for a minimum of 30 minutes , and to commit weight loss. The fact that changes in all these areas will reduce or eliminate all together the development of diabetes is stressed.  Updated lab needed at/ before next visit.      Latest Ref Rng & Units 04/08/2023   12:13 PM 11/18/2022    3:59 PM 04/10/2022    9:35 AM 01/07/2022   11:25 AM 11/27/2021    8:20 AM  Diabetic Labs  HbA1c 4.8 - 5.6 %  6.1  6.0   5.9   Chol 100 - 199 mg/dL  161  096   045   HDL >40 mg/dL  53  55   48   Calc LDL 0 - 99 mg/dL  981  191   478   Triglycerides 0 - 149 mg/dL  295  621   308   Creatinine 0.44 - 1.00 mg/dL 6.57  8.46  9.62  9.52  0.72       04/20/2023   10:06 AM 04/12/2023    1:59 PM 11/18/2022    2:46 PM 06/12/2022    8:05 AM 05/13/2022   10:57 AM 05/05/2022    2:48 PM 04/09/2022    8:29 AM  BP/Weight  Systolic BP 129 126 138 124 117 128 116  Diastolic BP 75 64 78 69 72 76 76  Wt. (Lbs) 282.12 284.2 282.04 286.04 285 286.4 283.12  BMI 48.43 kg/m2 48.78 kg/m2 48.41 kg/m2 49.1 kg/m2 48.92 kg/m2 49.16 kg/m2 48.6 kg/m2      Latest Ref Rng & Units 07/02/2020   12:00 AM  Foot/eye exam completion dates  Eye Exam No Retinopathy No Retinopathy         This result is from an external source.    Updated lab needed at/ before next visit.

## 2023-04-20 NOTE — Assessment & Plan Note (Signed)
  Patient re-educated about  the importance of commitment to a  minimum of 150 minutes of exercise per week as able.  The importance of healthy food choices with portion control discussed, as well as eating regularly and within a 12 hour window most days. The need to choose "clean , green" food 50 to 75% of the time is discussed, as well as to make water the primary drink and set a goal of 64 ounces water daily.       04/20/2023   10:06 AM 04/12/2023    1:59 PM 11/18/2022    2:46 PM  Weight /BMI  Weight 282 lb 1.9 oz 284 lb 3.2 oz 282 lb 0.6 oz  Height 5\' 4"  (1.626 m) 5\' 4"  (1.626 m) 5\' 4"  (1.626 m)  BMI 48.43 kg/m2 48.78 kg/m2 48.41 kg/m2

## 2023-04-20 NOTE — Assessment & Plan Note (Signed)
 Hyperlipidemia:Low fat diet discussed and encouraged.   Lipid Panel  Lab Results  Component Value Date   CHOL 220 (H) 11/18/2022   HDL 53 11/18/2022   LDLCALC 141 (H) 11/18/2022   TRIG 146 11/18/2022   CHOLHDL 4.2 11/18/2022     Updated lab needed at/ before next visit.

## 2023-04-20 NOTE — Progress Notes (Addendum)
 Kathleen Reynolds     MRN: [id number]      DOB: [birth year]  Chief Complaint  Patient presents with   Acute Visit    Woke with leg pain stemming from the back of the knee that makes it hard to move or walk.     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 Denies sinus pressure, nasal congestion, ear pain or sore throat. Denies chest congestion, productive cough or wheezing. Denies chest pains, palpitations and leg swelling Denies abdominal pain, nausea, vomiting,diarrhea or constipation.   Denies dysuria, frequency, hesitancy or incontinence. Denies skin break down or rash.   PE  BP 129/75   Pulse (!) 56   Ht 5\' 4"  (1.626 m)   Wt 282 lb 1.9 oz (128 kg)   LMP 03/27/2011   SpO2 94%   BMI 48.43 kg/m   Patient alert and oriented and in no cardiopulmonary distress.  HEENT: No facial asymmetry, EOMI,     Neck supple .  Chest: Clear to auscultation bilaterally.  CVS: S1, S2 no murmurs, no S3.Regular rate.  ABD: Soft non tender.   Ext: No edema  MS: decreased  ROM spine, shoulders, hips and and markedly decreased in both  knees.  Skin: Intact, no ulcerations or rash noted.  Psych: Good eye contact, normal affect. Memory intact not anxious or depressed appearing.  CNS: CN 2-12 intact, power,  normal throughout.no focal deficits noted.   Assessment & Plan  Knee pain, bilateral 10 daysa ago acute posterior right knee pain, yesterday acute pesterior left knee ain difficult to bend knee, pred x 5 days and urgent Ortho eval Work excuuse til 04/26/2023  Obesity, morbid, BMI 40.0-49.9 (HCC)  Patient re-educated about  the importance of commitment to a  minimum of 150 minutes of exercise per week as  able.  The importance of healthy food choices with portion control discussed, as well as eating regularly and within a 12 hour window most days. The need to choose "clean , green" food 50 to 75% of the time is discussed, as well as to make water the primary drink and set a goal of 64 ounces water daily.       04/20/2023   10:06 AM 04/12/2023    1:59 PM 11/18/2022    2:46 PM  Weight /BMI  Weight 282 lb 1.9 oz 284 lb 3.2 oz 282 lb 0.6 oz  Height 5\' 4"  (1.626 m) 5\' 4"  (1.626 m) 5\' 4"  (1.626 m)  BMI 48.43 kg/m2 48.78 kg/m2 48.41 kg/m2      Hyperlipidemia LDL goal <100 Hyperlipidemia:Low fat diet discussed and encouraged.   Lipid Panel  Lab Results  Component Value Date   CHOL 220 (H) 11/18/2022   HDL 53 11/18/2022   LDLCALC 141 (H) 11/18/2022   TRIG 146 11/18/2022   CHOLHDL 4.2 11/18/2022     Updated lab needed at/ before next visit.   Prediabetes Patient educated about the importance of limiting  Carbohydrate intake , the need to commit to daily physical activity for a minimum of 30 minutes , and to commit weight loss. The fact that changes in all these areas will reduce or eliminate all together the development of diabetes is stressed.  Updated lab needed at/ before next visit.      Latest Ref Rng & Units 04/08/2023   12:13 PM 11/18/2022    3:59 PM 04/10/2022    9:35 AM 01/07/2022   11:25 AM 11/27/2021    8:20 AM  Diabetic Labs  HbA1c 4.8 - 5.6 %  6.1  6.0   5.9   Chol 100 - 199 mg/dL  191  478   295   HDL >62 mg/dL  53  55   48   Calc LDL 0 - 99 mg/dL  130  865   784   Triglycerides 0 - 149 mg/dL  696  295   284   Creatinine 0.44 - 1.00 mg/dL 1.32  4.40  1.02  7.25  0.72       04/20/2023   10:06 AM 04/12/2023    1:59 PM 11/18/2022    2:46 PM 06/12/2022    8:05 AM 05/13/2022   10:57 AM 05/05/2022    2:48 PM 04/09/2022    8:29 AM  BP/Weight  Systolic BP 129 126 138 124 117 128 116  Diastolic BP 75 64 78 69 72 76 76  Wt. (Lbs) 282.12 284.2 282.04 286.04 285 286.4  283.12  BMI 48.43 kg/m2 48.78 kg/m2 48.41 kg/m2 49.1 kg/m2 48.92 kg/m2 49.16 kg/m2 48.6 kg/m2      Latest Ref Rng & Units 07/02/2020   12:00 AM  Foot/eye exam completion dates  Eye Exam No Retinopathy No Retinopathy         This result is from an external source.    Updated lab needed at/ before next visit.   Depression, major, single episode, severe (HCC) Medication started , holding off on therapy, not suicidal or n homicidal  GAD (generalized anxiety disorder) Lexapro 10 mg daily.  Therapy suggested however patient choosing to defer at this time follow-up in 6-8   Encounter for immunization After obtaining informed consent, the influenza  vaccine is  administered , with no adverse effect noted at the time of administration.

## 2023-04-20 NOTE — Assessment & Plan Note (Signed)
 10 daysa ago acute posterior right knee pain, yesterday acute pesterior left knee ain difficult to bend knee, pred x 5 days and urgent Ortho eval Work excuuse til 04/26/2023

## 2023-04-20 NOTE — Patient Instructions (Addendum)
 F/U in 3 months, call if you need me sooner  5 day course of prednisone is prescribed and you are referred to orthopedics on an urgent basis.  Work excuse from today to return April 26, 2023.  Labs today, fasting  Lipid,  HBA1C, TSH  You are referred for colonoscopy which is due this year based on your personal history of polyps and family history of early colon cancer.  New for depression and anxiety this Lexapro 10 mg daily, I also recommend that you join a support group or get individual therapy for anxiety and depression.  Please check blood pressure at least once weekly, if your blood pressure is staying at 140 or more you need to call in.  As of now I am removing the diagnosis of high blood pressure from urine problem list as you report not taking medication most of the time and your blood pressure is normal  Thanks for choosing [facility], we consider it a privelige to serve you.

## 2023-04-21 LAB — LIPID PANEL W/O CHOL/HDL RATIO
Cholesterol, Total: 232 mg/dL — ABNORMAL HIGH (ref 100–199)
HDL: 57 mg/dL (ref >39)
LDL Chol Calc (NIH): 152 mg/dL — ABNORMAL HIGH (ref 0–99)
Triglycerides: 128 mg/dL (ref 0–149)
VLDL Cholesterol Cal: 23 mg/dL (ref 5–40)

## 2023-04-21 LAB — HEMOGLOBIN A1C
Est. average glucose Bld gHb Est-mCnc: 134 mg/dL
Hgb A1c MFr Bld: 6.3 % — ABNORMAL HIGH (ref 4.8–5.6)

## 2023-04-21 LAB — TSH+FREE T4
Free T4: 0.88 ng/dL (ref 0.82–1.77)
TSH: 2.37 u[IU]/mL (ref 0.450–4.500)

## 2023-04-23 ENCOUNTER — Encounter: Payer: Self-pay | Admitting: Family Medicine

## 2023-04-28 ENCOUNTER — Other Ambulatory Visit: Payer: Self-pay

## 2023-04-28 ENCOUNTER — Other Ambulatory Visit (INDEPENDENT_AMBULATORY_CARE_PROVIDER_SITE_OTHER): Payer: Self-pay

## 2023-04-28 ENCOUNTER — Ambulatory Visit: Admitting: Orthopedic Surgery

## 2023-04-28 ENCOUNTER — Encounter: Payer: Self-pay | Admitting: Orthopedic Surgery

## 2023-04-28 VITALS — BP 118/72 | HR 65 | Ht 64.0 in | Wt 283.0 lb

## 2023-04-28 DIAGNOSIS — M170 Bilateral primary osteoarthritis of knee: Secondary | ICD-10-CM | POA: Diagnosis not present

## 2023-04-28 DIAGNOSIS — M25561 Pain in right knee: Secondary | ICD-10-CM

## 2023-04-28 DIAGNOSIS — M1711 Unilateral primary osteoarthritis, right knee: Secondary | ICD-10-CM

## 2023-04-28 DIAGNOSIS — M1712 Unilateral primary osteoarthritis, left knee: Secondary | ICD-10-CM

## 2023-04-28 NOTE — Progress Notes (Signed)
 New Patient Visit  Assessment: Kathleen Reynolds is a 63 y.o. 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  We reviewed the radiographs in clinic today, and I outlined the natural progression.  We had an extensive discussion regarding all potential treatment options, including continuing with the current treatment. NSAIDs are the most appropriate medications, and these are available OTC or via prescription.  I have urged them to remain active, and they can continue with activities on their own, or we can refer them to physical therapy.  We can also consider a brace, or compression sleeve. If the pain is severe enough, we can consider a steroid injection.    Based on radiographs, the arthritis is advanced enough to consider knee replacement.  Unfortunately, her BMI is currently 48, which would exclude her from consideration.  This was briefly discussed with the patient.  She states her understanding.  After discussing all of these options, the patient has elected to proceed with bilateral knee injections.  The patient meets the AMA guidelines for Morbid obesity with BMI > 40.  The patient has been counseled on weight loss.    Procedure note injection Right knee joint   Verbal consent was obtained to inject the right knee joint  Timeout was completed to confirm the site of injection.  The skin was prepped with alcohol and ethyl chloride was sprayed at the injection site.  A 21-gauge needle was used to inject 40 mg of Depo-Medrol and 1% lidocaine (4 cc) into the right knee using an anterolateral approach.  There were no complications. A sterile bandage was applied.   Procedure note injection Left knee joint   Verbal consent was obtained to inject the left knee joint  Timeout was completed to confirm the site of injection.  The skin was prepped with alcohol and ethyl chloride was sprayed at the injection site.  A  21-gauge needle was used to inject 40 mg of Depo-Medrol and 1% lidocaine (4 cc) into the left knee using an anterolateral approach.  There were no complications. A sterile bandage was applied.      Follow-up: Return if symptoms worsen or fail to improve.  Subjective:  Chief Complaint  Patient presents with   Knee Pain    Bilat R for yrs L for approx 1 wk but caused severe pain and she was unable to walk for a moment.     History of Present Illness: Kathleen Reynolds is a 63 y.o. female who presents for evaluation of bilateral knee pain.  Right is worse than left.  She has had pain in both knees for several years.  No specific injury.  She has previously had injections, but it has been several years.  She takes [employer], states that this is the only thing that works.  She has not noticed the deformity in either knee.  She does not use an assistive device.   Review of Systems: No fevers or chills No numbness or tingling No chest pain No shortness of breath No bowel or bladder dysfunction No GI distress No headaches   Medical History:  Past Medical History:  Diagnosis Date   Anemia LIFELONG   HEAVY MENSES MAR 2012 HB 13 MCV 86.7 FERRITIN  66   Arthritis    Breast cancer (HCC)    Right   Breast mass in female    right   Colonic adenoma 02/05/2011   Diabetes mellitus without complication (HCC)  pre diabetic   Hyperlipidemia    Left rotator cuff tear    Migraines    Morbid obesity (HCC)    Personal history of chemotherapy    Personal history of radiation therapy    Pre-diabetes    RUPTURE ROTATOR CUFF 06/08/2007   Qualifier: Diagnosis of  By: Romeo Apple MD, Mliss Fritz, Achilles, right    Urticaria     Past Surgical History:  Procedure Laterality Date   ACHILLES TENDON SURGERY Right 09/22/2019   Procedure: RIGHT ACHILLES TENDON RECONSTRUCTION;  Surgeon: Nadara Mustard, MD;  Location: [facility] OR;  Service: Orthopedics;  Laterality: Right;   BREAST BIOPSY   02/13/2011   Procedure: BREAST BIOPSY WITH NEEDLE LOCALIZATION;  Surgeon: Ernestene Mention, MD;  Location: [facility];  Service: General;  Laterality: Right;   BREAST LUMPECTOMY Right 2013   BREAST SURGERY  11/1997   Left breast cancer-mastectomy   BREAST SURGERY  2/13-   rt axilly bx   COLONOSCOPY  2004 DR. SMITH   No polyp, hemorrhoids   COLONOSCOPY  06/09/10   QMV:HQIONG adenomas    COLONOSCOPY N/A 08/19/2015   Procedure: COLONOSCOPY;  Surgeon: West Bali, MD;  Location: [facility];  Service: Endoscopy;  Laterality: N/A;  130 - moved to 12:45 - Ginger notified pt   COLONOSCOPY WITH PROPOFOL N/A 11/29/2018   Fields: Past simple adenomas removed, diverticulosis, hemorrhoids.  Next colonoscopy in 3 years.   DILATION AND CURETTAGE OF UTERUS     DILATION AND CURETTAGE OF UTERUS  05/2002   hystersonogram  2009   MASTECTOMY  1999   left breast cancer    POLYPECTOMY  11/29/2018   Procedure: POLYPECTOMY;  Surgeon: West Bali, MD;  Location: [facility];  Service: Endoscopy;;  colon   PORT-A-CATH REMOVAL  10/20/2011   Procedure: MINOR REMOVAL PORT-A-CATH;  Surgeon: Ernestene Mention, MD;  Location: [facility];  Service: General;  Laterality: N/A;  Porta-cath removal  left   PORTACATH PLACEMENT  03/27/2011   Procedure: INSERTION PORT-A-CATH;  Surgeon: Ernestene Mention, MD;  Location: [facility];  Service: General;  Laterality: Left;  insert port a cath    Family History  Problem Relation Age of Onset   Hypertension Mother    Cancer Father        lung cancer   Diabetes Sister    Cancer Brother        colon cancer, younger than the age of 26   Heart disease Sister        heart attack   Colon polyps Sister    Asthma Sister    Eczema Sister    Allergic rhinitis Neg Hx    Urticaria Neg Hx    Social History   Tobacco Use   Smoking status: Never   Smokeless tobacco: Never  Vaping Use   Vaping status: Never Used  Substance Use  Topics   Alcohol use: Yes    Alcohol/week: 2.0 standard drinks of alcohol    Types: 2 Standard drinks or equivalent per week    Comment: occasional;    Drug use: No    Allergies  Allergen Reactions   Nsaids Anaphylaxis    Ibuprofen required hospitalization in 2016 due to ibuprofen   Ibuprofen Itching    Current Meds  Medication Sig   Ascorbic Acid (VITAMIN C) 500 MG CAPS Take 500 mg by mouth daily.    azelastine (ASTELIN) 0.1 % nasal  spray Place 2 sprays into both nostrils 2 (two) times daily. Use in each nostril as directed   Biotin w/ Vitamins C & E (HAIR/SKIN/NAILS PO) Take 2 tablets by mouth daily.   butalbital-acetaminophen-caffeine (FIORICET) 50-325-40 MG tablet Take 1 tablet by mouth every 6 (six) hours as needed for headache.   calcium carbonate (OSCAL) 1500 (600 Ca) MG TABS tablet Take 600 mg of elemental calcium by mouth 2 (two) times daily with a meal.   cyclobenzaprine (FLEXERIL) 10 MG tablet Take 1 tablet (10 mg total) by mouth 2 (two) times daily as needed for muscle spasms.   ELDERBERRY PO Take 1,000 mg by mouth daily.   escitalopram (LEXAPRO) 10 MG tablet Take 1 tablet (10 mg total) by mouth daily.   fluticasone (FLONASE) 50 MCG/ACT nasal spray Place 2 sprays into both nostrils daily.   meclizine (ANTIVERT) 25 MG tablet TAKE 1 TABLET BY MOUTH 3 TIMES A DAY AS NEEDED FOR DIZZINESS   Multiple Vitamin (MULITIVITAMIN WITH MINERALS) TABS Take 1 tablet by mouth daily.   olmesartan (BENICAR) 20 MG tablet TAKE 1 TABLET BY MOUTH EVERY DAY    Objective: BP 118/72   Pulse 65   Ht 5\' 4"  (1.626 m)   Wt 283 lb (128.4 kg)   LMP 03/27/2011   BMI 48.58 kg/m   Physical Exam:  General: Alert and oriented. and No acute distress. Gait: Slow, waddling gait.  Bilateral knees without effusion.  Tenderness to palpation along the medial joint line.  She is able to achieve full extension bilaterally.  She does not tolerate flexion beyond 90 degrees.  Negative Lachman bilaterally.   No increased laxity to varus or valgus stress.  IMAGING: I personally ordered and reviewed the following images  X-rays of bilateral knees were obtained in clinic today.  No acute injuries are noted.  Complete loss of joint space within the medial compartment, with large osteophytes.  Within the patellofemoral compartment,, there are large osteophytes noted of the patella, as well as the medial femoral condyles.  No bony lesions.  Impression: Severe bilateral knee arthritis   New Medications:  No orders of the defined types were placed in this encounter.     Oliver Barre, MD  04/28/2023 9:07 AM

## 2023-04-28 NOTE — Patient Instructions (Signed)

## 2023-05-14 ENCOUNTER — Other Ambulatory Visit: Payer: Self-pay | Admitting: Family Medicine

## 2023-05-14 DIAGNOSIS — Z23 Encounter for immunization: Secondary | ICD-10-CM | POA: Insufficient documentation

## 2023-05-14 NOTE — Assessment & Plan Note (Signed)
 Medication started , holding off on therapy, not suicidal or n homicidal

## 2023-05-14 NOTE — Assessment & Plan Note (Signed)
 Lexapro 10 mg daily.  Therapy suggested however patient choosing to defer at this time follow-up in 6-8

## 2023-05-14 NOTE — Assessment & Plan Note (Signed)
 After obtaining informed consent, the influenza vaccine is  administered , with no adverse effect noted at the time of administration.

## 2023-07-21 ENCOUNTER — Encounter: Payer: Self-pay | Admitting: Family Medicine

## 2023-07-21 ENCOUNTER — Ambulatory Visit: Admitting: Family Medicine

## 2023-07-21 VITALS — BP 142/84 | HR 61 | Resp 16 | Ht 64.0 in | Wt 282.1 lb

## 2023-07-21 DIAGNOSIS — Z634 Disappearance and death of family member: Secondary | ICD-10-CM

## 2023-07-21 DIAGNOSIS — E785 Hyperlipidemia, unspecified: Secondary | ICD-10-CM | POA: Diagnosis not present

## 2023-07-21 DIAGNOSIS — I10 Essential (primary) hypertension: Secondary | ICD-10-CM

## 2023-07-21 DIAGNOSIS — R7303 Prediabetes: Secondary | ICD-10-CM | POA: Diagnosis not present

## 2023-07-21 DIAGNOSIS — F4321 Adjustment disorder with depressed mood: Secondary | ICD-10-CM

## 2023-07-21 DIAGNOSIS — F4323 Adjustment disorder with mixed anxiety and depressed mood: Secondary | ICD-10-CM

## 2023-07-21 MED ORDER — OLMESARTAN MEDOXOMIL 20 MG PO TABS: 20.0000 mg | ORAL_TABLET | Freq: Every day | ORAL | 3 refills | Status: AC

## 2023-07-21 NOTE — Assessment & Plan Note (Signed)
  Patient re-educated about  the importance of commitment to a  minimum of 150 minutes of exercise per week as able.  The importance of healthy food choices with portion control discussed, as well as eating regularly and within a 12 hour window most days. The need to choose clean , green food 50 to 75% of the time is discussed, as well as to make water the primary drink and set a goal of 64 ounces water daily.       07/21/2023    9:03 AM 04/28/2023    8:25 AM 04/20/2023   10:06 AM  Weight /BMI  Weight 282 lb 1.3 oz 283 lb 282 lb 1.9 oz  Height 5' 4 (1.626 m) 5' 4 (1.626 m) 5' 4 (1.626 m)  BMI 48.42 kg/m2 48.58 kg/m2 48.43 kg/m2

## 2023-07-21 NOTE — Assessment & Plan Note (Signed)
 Patient educated about the importance of limiting  Carbohydrate intake , the need to commit to daily physical activity for a minimum of 30 minutes , and to commit weight loss. The fact that changes in all these areas will reduce or eliminate all together the development of diabetes is stressed.      Latest Ref Rng & Units 04/20/2023   11:15 AM 04/08/2023   12:13 PM 11/18/2022    3:59 PM 04/10/2022    9:35 AM 01/07/2022   11:25 AM  Diabetic Labs  HbA1c 4.8 - 5.6 % 6.3   6.1  6.0    Chol 100 - 199 mg/dL 098   119  147    HDL >82 mg/dL 57   53  55    Calc LDL 0 - 99 mg/dL 956   213  086    Triglycerides 0 - 149 mg/dL 578   469  629    Creatinine 0.44 - 1.00 mg/dL  5.28  4.13  2.44  0.10       07/21/2023    9:51 AM 07/21/2023    9:03 AM 04/28/2023    8:25 AM 04/20/2023   10:06 AM 04/12/2023    1:59 PM 11/18/2022    2:46 PM 06/12/2022    8:05 AM  BP/Weight  Systolic BP 142 150 118 129 126 138 124  Diastolic BP 84 88 72 75 64 78 69  Wt. (Lbs)  282.08 283 282.12 284.2 282.04 286.04  BMI  48.42 kg/m2 48.58 kg/m2 48.43 kg/m2 48.78 kg/m2 48.41 kg/m2 49.1 kg/m2      Latest Ref Rng & Units 07/02/2020   12:00 AM  Foot/eye exam completion dates  Eye Exam No Retinopathy No Retinopathy         This result is from an external source.    Updated lab needed at/ before next visit.

## 2023-07-21 NOTE — Progress Notes (Signed)
 Kathleen Kathleen Reynolds     MRN: [id number]      DOB: Jul 07, 1960  Chief Complaint  Patient presents with   Hypertension    3 month follow up     HPI Kathleen Kathleen Reynolds is here for follow up and re-evaluation of chronic medical conditions, medication management and review of any available recent lab and radiology data.  Preventive health is updated, specifically  Cancer screening and Immunization.   Lost 63 year old son May 31 after chronic illness , grieving as to be expected and having difficulty functioning. 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 Had also lost a sister and a nephew in the past 6 months. No regular exercise commitment , and currently eating an excessive amount of sugar Patient and/or legal guardian verbally consented to Kathleen Kathleen Reynolds Kathleen Reynolds about presenting concerns and psychiatric consultation as appropriate.  The Kathleen Reynolds will be billed as appropriate for the patient  ROS Denies recent fever or chills. Denies sinus pressure, nasal congestion, ear pain or sore throat. Denies chest congestion, productive cough or wheezing. Denies chest pains, palpitations and leg swelling Denies abdominal pain, nausea, vomiting,diarrhea or constipation.   Denies dysuria, frequency, hesitancy or incontinence. Chronic  joint pain, swelling and limitation in mobility. Denies headaches, seizures, numbness, or tingling. Currently experiencing grief and  depression, anxiety or insomnia. Denies skin break down or rash.   PE  BP (!) 142/84   Pulse 61   Resp 16   Ht 5' 4 (1.626 m)   Wt 282 lb 1.3 oz (128 kg)   LMP 03/27/2011   SpO2 94%   BMI 48.42 kg/m   Patient alert and oriented and in no cardiopulmonary distress.Crying and tearful at times  HEENT: No facial asymmetry, EOMI,     Neck supple .  Chest: Clear to auscultation  bilaterally.  CVS: S1, S2 no murmurs, no S3.Regular rate.  ABD: Soft non tender.   Ext: No edema  MS: Adequate ROM spine, shoulders, hips and knees.  Skin: Intact, no ulcerations or rash noted.  Psych: Good eye contact, Tearful and  depressed appearing.  CNS: CN 2-12 intact, power,  normal throughout.no focal deficits noted.   Assessment & Plan  Prediabetes Patient educated about the importance of limiting  Carbohydrate intake , the need to commit to daily physical activity for a minimum of 30 minutes , and to commit weight loss. The fact that changes in all these areas will reduce or eliminate all together the development of diabetes is stressed.      Latest Ref Rng & Units 04/20/2023   11:15 AM 04/08/2023   12:13 PM 11/18/2022    3:59 PM 04/10/2022    9:35 AM 01/07/2022   11:25 AM  Diabetic Labs  HbA1c 4.8 - 5.6 % 6.3   6.1  6.0    Chol 100 - 199 mg/dL 308   657  846    HDL >96 mg/dL 57   53  55    Calc LDL 0 - 99 mg/dL 295   284  132    Triglycerides 0 - 149 mg/dL 440   102  725    Creatinine 0.44 - 1.00 mg/dL  3.66  4.40  3.47  4.25       07/21/2023    9:51 AM 07/21/2023    9:03 AM 04/28/2023  8:25 AM 04/20/2023   10:06 AM 04/12/2023    1:59 PM 11/18/2022    2:46 PM 06/12/2022    8:05 AM  BP/Weight  Systolic BP 142 150 118 129 126 138 124  Diastolic BP 84 88 72 75 64 78 69  Wt. (Lbs)  282.08 283 282.12 284.2 282.04 286.04  BMI  48.42 kg/m2 48.58 kg/m2 48.43 kg/m2 48.78 kg/m2 48.41 kg/m2 49.1 kg/m2      Latest Ref Rng & Units 07/02/2020   12:00 AM  Foot/eye exam completion dates  Eye Exam No Retinopathy No Retinopathy         This result is from an external source.    Updated lab needed at/ before next visit.   Obesity, morbid, BMI 40.0-49.9 (HCC)  Patient re-educated about  the importance of commitment to a  minimum of 150 minutes of exercise per week as able.  The importance of healthy food choices with portion control discussed, as well as eating  regularly and within a 12 hour window most days. The need to choose clean , green food 50 to 75% of the time is discussed, as well as to make water the primary drink and set a goal of 64 ounces water daily.       07/21/2023    9:03 AM 04/28/2023    8:25 AM 04/20/2023   10:06 AM  Weight /BMI  Weight 282 lb 1.3 oz 283 lb 282 lb 1.9 oz  Height 5' 4 (1.626 m) 5' 4 (1.626 m) 5' 4 (1.626 m)  BMI 48.42 kg/m2 48.58 kg/m2 48.43 kg/m2      Hyperlipidemia LDL goal <100 Hyperlipidemia:Low fat diet discussed and encouraged.   Lipid Panel  Lab Results  Component Value Date   CHOL 232 (H) 04/20/2023   HDL 57 04/20/2023   LDLCALC 152 (H) 04/20/2023   TRIG 128 04/20/2023   CHOLHDL 4.2 11/18/2022     Needs to reduce fat intake, not at goal  Essential hypertension DASH diet and commitment to daily physical activity for a minimum of 30 minutes discussed and encouraged, as a part of hypertension management. The importance of attaining a healthy weight is also discussed.     07/21/2023    9:51 AM 07/21/2023    9:03 AM 04/28/2023    8:25 AM 04/20/2023   10:06 AM 04/12/2023    1:59 PM 11/18/2022    2:46 PM 06/12/2022    8:05 AM  BP/Weight  Systolic BP 142 150 118 129 126 138 124  Diastolic BP 84 88 72 75 64 78 69  Wt. (Lbs)  282.08 283 282.12 284.2 282.04 286.04  BMI  48.42 kg/m2 48.58 kg/m2 48.43 kg/m2 48.78 kg/m2 48.41 kg/m2 49.1 kg/m2     No med change will reassess in 3 months, stressed, crying  Grief at loss of child Acute loss of only child, 41 years oldless than 3 weeks ago. Incapable of functioninc on a job currently Work excuse tpo return 08/09/2023 and refer  for therapy  Adjustment reaction with anxiety and depression Uncontrolled , continue lexapro  and refer therapy

## 2023-07-21 NOTE — Patient Instructions (Addendum)
 F/U  in 3 months, call if you need me sooner  Nurse please send for recent pap and, pt had this in March, 2025  Work excuse from June 2 to return July 7 to be provided  Fasting lipid,  bmp and EGFR and hBA1C 3 to 5 days before follow up  You are being referred to Therapist  Thanks for choosing [facility], we consider it a privelige to serve you.

## 2023-07-21 NOTE — Assessment & Plan Note (Signed)
 Acute loss of only child, 41 years oldless than 3 weeks ago. Incapable of functioninc on a job currently Work excuse tpo return 08/09/2023 and refer  for therapy

## 2023-07-21 NOTE — Assessment & Plan Note (Signed)
 Uncontrolled , continue lexapro  and refer therapy

## 2023-07-21 NOTE — Assessment & Plan Note (Signed)
 Hyperlipidemia:Low fat diet discussed and encouraged.   Lipid Panel  Lab Results  Component Value Date   CHOL 232 (H) 04/20/2023   HDL 57 04/20/2023   LDLCALC 152 (H) 04/20/2023   TRIG 128 04/20/2023   CHOLHDL 4.2 11/18/2022     Needs to reduce fat intake, not at goal

## 2023-07-21 NOTE — Assessment & Plan Note (Signed)
 DASH diet and commitment to daily physical activity for a minimum of 30 minutes discussed and encouraged, as a part of hypertension management. The importance of attaining a healthy weight is also discussed.     07/21/2023    9:51 AM 07/21/2023    9:03 AM 04/28/2023    8:25 AM 04/20/2023   10:06 AM 04/12/2023    1:59 PM 11/18/2022    2:46 PM 06/12/2022    8:05 AM  BP/Weight  Systolic BP 142 150 118 129 126 138 124  Diastolic BP 84 88 72 75 64 78 69  Wt. (Lbs)  282.08 283 282.12 284.2 282.04 286.04  BMI  48.42 kg/m2 48.58 kg/m2 48.43 kg/m2 48.78 kg/m2 48.41 kg/m2 49.1 kg/m2     No med change will reassess in 3 months, stressed, crying

## 2023-07-28 NOTE — Addendum Note (Signed)
 Addended by: ANTONETTA ROLLENE BRAVO on: 07/28/2023 02:23 PM   Modules accepted: Orders

## 2023-07-30 ENCOUNTER — Ambulatory Visit: Payer: Self-pay | Admitting: Professional Counselor

## 2023-07-30 DIAGNOSIS — F322 Major depressive disorder, single episode, severe without psychotic features: Secondary | ICD-10-CM

## 2023-07-30 NOTE — BH Specialist Note (Signed)
 Collaborative Care Initial Assessment  Session Start time: 3:00 pm   Session End time: 4:00 pm  Total time in minutes: 60 min   Type of Contact:  Face to Face Patient consent obtained:  Yes Types of Service: Collaborative care  Summary  Patient is a 63 yo female being referred to collaborative care by his pcp for anxiety and depression. Patient was engaged and cooperative during session.   Reason for referral in patient/family's own words:  My son passed away  Patient's goal for today's visit: Just someone to talk to  History of Present illness:   The patient is a 63 year old female who presented for a collaborative care assessment. She is currently experiencing profound grief following the recent loss of her son, who had a known heart condition. Although his illness was serious, his death felt unexpected to the family, as they believed he was improving. The patient reports that this loss has been extremely difficult to process and is further compounded by the deaths of her uncle, sister, and nephew--all occurring within a relatively short time span.  She is also the primary caregiver for her mother, which has made it even more difficult to fully grieve or focus on her own emotional needs. The patient reports difficulty sleeping, low energy, and a sense of emotional numbness. She is currently on a leave of absence from work and does not feel ready to return, stating that the bereavement leave she received was insufficient. She feels unable to function in a work environment at this time.  The patient denies any previous psychiatric history, history of trauma, suicide attempts, or self-harm. She also denies current suicidal ideation and has no history of substance use. She has limited family support and shared that she often struggles to reach out to others. She expressed a strong desire to speak with someone outside of her family for unbiased emotional support.  She was recently  prescribed Lexapro  10 mg but has not initiated the medication due to concerns about potential dependency. Given her presentation, a psychiatric consultation is recommended to assess for medication appropriateness and to help determine the best plan of care. The patient appears motivated to seek help and is open to therapeutic support.   Clinical Assessment   PHQ-9 Assessments:    07/30/2023    3:21 PM 07/21/2023    9:03 AM 04/20/2023   10:38 AM 11/18/2022    2:47 PM 06/12/2022    8:06 AM  Depression screen PHQ 2/9  Decreased Interest 2 2 3 2  0  Down, Depressed, Hopeless 1 2 3  0 0  PHQ - 2 Score 3 4 6 2  0  Altered sleeping 0 1 2 0 0  Tired, decreased energy 2 2 3 3  0  Change in appetite 0 0 0 0 0  Feeling bad or failure about yourself  0 0 2 0 0  Trouble concentrating 0 2 1 0 0  Moving slowly or fidgety/restless 1 0 0 0 0  Suicidal thoughts 0 0 0 0 0  PHQ-9 Score 6 9 14 5  0  Difficult doing work/chores Not difficult at all Somewhat difficult Somewhat difficult Somewhat difficult Not difficult at all    GAD-7 Assessments:    07/30/2023    3:25 PM 07/21/2023    9:04 AM 04/20/2023   10:40 AM 11/18/2022    2:47 PM  GAD 7 : Generalized Anxiety Score  Nervous, Anxious, on Edge 0 0 0   Control/stop worrying 2 2 3 2   Worry too  much - different things 1 2 3 2   Trouble relaxing 0 2 1 2   Restless 0 1 2 0  Easily annoyed or irritable 0 0 2 0  Afraid - awful might happen 0 0 0 0  Total GAD 7 Score 3 7 11    Anxiety Difficulty Not difficult at all   Not difficult at all     Social History:  Household: Lives with husband Marital status: Married Number of Children:  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      azelastine  (ASTELIN ) 0.1 % nasal spray Place 2 sprays into both nostrils 2 (two) times daily. Use in each nostril as directed 30 mL 12   Biotin w/ Vitamins C & E (HAIR/SKIN/NAILS PO) Take 2 tablets by mouth daily.     butalbital -acetaminophen -caffeine  (FIORICET) 50-325-40 MG tablet Take 1 tablet by mouth every 6 (six) hours as needed for headache. 20 tablet 0   calcium  carbonate (OSCAL) 1500 (600 Ca) MG TABS tablet Take 600 mg of elemental calcium  by mouth 2 (two) times daily with a meal.     cyclobenzaprine  (FLEXERIL ) 10 MG tablet Take 1 tablet (10 mg total) by mouth 2 (two) times daily as needed for muscle spasms. 20 tablet 0   ELDERBERRY PO Take 1,000 mg by mouth daily.     escitalopram  (LEXAPRO ) 10 MG tablet TAKE 1 TABLET BY MOUTH EVERY DAY 90 tablet 1   fluticasone  (FLONASE ) 50 MCG/ACT nasal spray Place 2 sprays into both nostrils daily. 48 mL 1   meclizine  (ANTIVERT ) 25 MG tablet TAKE 1 TABLET BY MOUTH 3 TIMES A DAY AS NEEDED FOR DIZZINESS 30 tablet 2   Multiple Vitamin (MULITIVITAMIN WITH MINERALS) TABS Take 1 tablet by mouth daily.     olmesartan  (BENICAR ) 20 MG tablet Take 1 tablet (20 mg total) by mouth daily. 30 tablet 3   No current facility-administered medications on file prior to visit.    Psychiatric History: Past psychiatry diagnosis: Denies  Patient currently being seen by therapist/psychiatrist: Denies  Prior Suicide Attempts: Denies Past psychiatry Hospitalization(s): Denies Past history of violence: Denies  Traumatic Experiences: History or current traumatic events (natural disaster, house fire, etc.)? no History or current physical trauma?  yes History or current emotional trauma?  no History or current sexual  trauma?  no History or current domestic or intimate partner violence?  no PTSD symptoms if any traumatic experiences no   Alcohol and/or Substance Use History   Tobacco Alcohol Other substances  Current use  2-3 drinks 2-3 times a week Smoke joint few times a month  Past use     Past treatment      Withdrawal Potential: Mild  Self-harm Behaviors Risk Assessment Self-harm risk factors: Depression, aging, grief Patient endorses recent thoughts of harming self: Denies [place] Suicide Severity Rating Scale:   Guns in the home: Yes locked away.    Protective factors: Family support, no si reported  Danger to Others Risk Assessment Danger to others risk factors:  None Patient endorses recent  thoughts of harming others:  Denies  Consulting civil engineer discussed emergency crisis plan with client and provided local emergency services resources.  Mental status exam:   General Appearance [place]:  Casual Eye Contact:  Good Motor Behavior:  Normal Speech:  Normal Level of Consciousness:  Alert Mood:  Negative Affect:  Appropriate Anxiety Level:  None Thought Process:  Coherent Thought Content:  WNL Perception:  Normal Judgment:  Good Insight:  Present  Diagnosis:   Goals: Increase healthy adjustment to current life circumstances   Interventions: Solution-Focused Strategies and CBT Cognitive Behavioral Therapy

## 2023-08-02 NOTE — Patient Instructions (Signed)
 If your symptoms worsen or you have thoughts of suicide/homicide, PLEASE SEEK IMMEDIATE MEDICAL ATTENTION.  You may always call:   National Suicide Hotline: 988 or (913)195-1115 [place] Crisis Line: 343-675-8477 Crisis Recovery in [place]: [phone]     These are available 24 hours a day, 7 days a week.

## 2023-08-11 ENCOUNTER — Telehealth (INDEPENDENT_AMBULATORY_CARE_PROVIDER_SITE_OTHER): Payer: Self-pay | Admitting: Professional Counselor

## 2023-08-11 DIAGNOSIS — F322 Major depressive disorder, single episode, severe without psychotic features: Secondary | ICD-10-CM | POA: Diagnosis not present

## 2023-08-11 NOTE — Progress Notes (Signed)
 I have reviewed and agree with the documentation. Kathleen Reynolds. Lodema Hong, MD [facility] Location Provider: office Location Patient: home

## 2023-08-11 NOTE — BH Specialist Note (Signed)
 Virtual Behavioral Health Treatment Plan Team Note  MRN: [id number] NAME: Kathleen Reynolds  DATE: 08/11/2023  Start time: Start Time: 1027 End time: Stop Time: 1032 Total time: Total Time in Minutes (Visit): 5  Total number of Virtual BH Treatment Team Plan encounters: 1/4  Treatment Team Attendees:   Attestation signed by Jenniffer Lavetta LABOR, MD at 08/11/2023 10:30 AM   Collaborative Care Psychiatric Consultant Case Review    Assessment/Provisional Diagnosis Kathleen Reynolds is a 63 y.o. year old female with history of migraines and depression. The patient is referred for Grief.    PHQ9 of 6 GAD7 of 3   # MDD, Single episode, Mild.    Recommendation Continue Lexapro  10 mg daily Refer to therapy.  BH specialist to follow up.     Thank you for your consult. Please contact our collaborative care team for any questions or concerns.    I spent 20 minutes chart reviewing, discussing with Mr. Reida and documenting in the chart.   Diagnoses:    ICD-10-CM   1. Depression, major, single episode, severe (HCC)  F32.2       Goals, Interventions and Follow-up Plan Goals: Increase healthy adjustment to current life circumstances Interventions: Solution-Focused Strategies CBT Cognitive Behavioral Therapy Medication Management Recommendations: Continue Lexapro  10 mg Follow-up Plan:  Refer to traditional therapy  History of the present illness Presenting Problem/Current Symptoms:  The patient is a 63 year old female who presented for a collaborative care assessment. She is currently experiencing profound grief following the recent loss of her son, who had a known heart condition. Although his illness was serious, his death felt unexpected to the family, as they believed he was improving. The patient reports that this loss has been extremely difficult to process and is further compounded by the deaths of her uncle, sister, and nephew--all occurring within a relatively short time  span.   She is also the primary caregiver for her mother, which has made it even more difficult to fully grieve or focus on her own emotional needs. The patient reports difficulty sleeping, low energy, and a sense of emotional numbness. She is currently on a leave of absence from work and does not feel ready to return, stating that the bereavement leave she received was insufficient. She feels unable to function in a work environment at this time.   The patient denies any previous psychiatric history, history of trauma, suicide attempts, or self-harm. She also denies current suicidal ideation and has no history of substance use. She has limited family support and shared that she often struggles to reach out to others. She expressed a strong desire to speak with someone outside of her family for unbiased emotional support.   She was recently prescribed Lexapro  10 mg but has not initiated the medication due to concerns about potential dependency. Given her presentation, a psychiatric consultation is recommended to assess for medication appropriateness and to help determine the best plan of care. The patient appears motivated to seek help and is open to therapeutic support   Screenings PHQ-9 Assessments:     07/30/2023    3:21 PM 07/21/2023    9:03 AM 04/20/2023   10:38 AM  Depression screen PHQ 2/9  Decreased Interest 2 2 3   Down, Depressed, Hopeless 1 2 3   PHQ - 2 Score 3 4 6   Altered sleeping 0 1 2  Tired, decreased energy 2 2 3   Change in appetite 0 0 0  Feeling bad or failure about yourself  0 0 2  Trouble concentrating 0 2 1  Moving slowly or fidgety/restless 1 0 0  Suicidal thoughts 0 0 0  PHQ-9 Score 6 9 14   Difficult doing work/chores Not difficult at all Somewhat difficult Somewhat difficult   GAD-7 Assessments:     07/30/2023    3:25 PM 07/21/2023    9:04 AM 04/20/2023   10:40 AM 11/18/2022    2:47 PM  GAD 7 : Generalized Anxiety Score  Nervous, Anxious, on Edge 0 0 0    Control/stop worrying 2 2 3 2   Worry too much - different things 1 2 3 2   Trouble relaxing 0 2 1 2   Restless 0 1 2 0  Easily annoyed or irritable 0 0 2 0  Afraid - awful might happen 0 0 0 0  Total GAD 7 Score 3 7 11    Anxiety Difficulty Not difficult at all   Not difficult at all    Past Medical History Past Medical History:  Diagnosis Date   Anemia LIFELONG   HEAVY MENSES MAR 2012 HB 13 MCV 86.7 FERRITIN  66   Arthritis    Breast cancer (HCC)    Right   Breast mass in female    right   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  Allergies:  Allergies as of 08/11/2023 - Review Complete 07/21/2023  Allergen Reaction Noted   Nsaids Anaphylaxis 02/05/2015   Ibuprofen  Itching 07/10/2010    Medication History Current medications:  Outpatient Encounter Medications as of 08/11/2023  Medication Sig   Ascorbic Acid (VITAMIN C) 500 MG CAPS Take 500 mg by mouth daily.    azelastine  (ASTELIN ) 0.1 % nasal spray Place 2 sprays into both nostrils 2 (two) times daily. Use in each nostril as directed   Biotin w/ Vitamins C & E (HAIR/SKIN/NAILS PO) Take 2 tablets by mouth daily.   butalbital -acetaminophen -caffeine  (FIORICET) 50-325-40 MG tablet Take 1 tablet by mouth every 6 (six) hours as needed for headache.   calcium  carbonate (OSCAL) 1500 (600 Ca) MG TABS tablet Take 600 mg of elemental calcium  by mouth 2 (two) times daily with a meal.   cyclobenzaprine  (FLEXERIL ) 10 MG tablet Take 1 tablet (10 mg total) by mouth 2 (two) times daily as needed for muscle spasms.   ELDERBERRY  PO Take 1,000 mg by mouth daily.   escitalopram  (LEXAPRO ) 10 MG tablet TAKE 1 TABLET BY MOUTH EVERY DAY   fluticasone  (FLONASE ) 50 MCG/ACT nasal spray Place 2 sprays into both nostrils daily.   meclizine  (ANTIVERT ) 25 MG tablet TAKE 1 TABLET BY MOUTH 3 TIMES A DAY AS NEEDED FOR DIZZINESS   Multiple Vitamin (MULITIVITAMIN WITH MINERALS) TABS Take 1 tablet by mouth daily.   olmesartan  (BENICAR ) 20 MG tablet Take 1 tablet (20 mg total) by mouth daily.   No facility-administered encounter medications on file as of 08/11/2023.     Scribe for Treatment Team: Redell JINNY Corn

## 2023-08-13 ENCOUNTER — Ambulatory Visit: Payer: Self-pay | Admitting: Professional Counselor

## 2023-10-18 ENCOUNTER — Other Ambulatory Visit (HOSPITAL_COMMUNITY)

## 2023-10-18 ENCOUNTER — Ambulatory Visit (HOSPITAL_COMMUNITY)
Admission: RE | Admit: 2023-10-18 | Discharge: 2023-10-18 | Disposition: A | Discharge status: Home / Self Care | Source: Ambulatory Visit | Attending: Physician Assistant | Admitting: Physician Assistant

## 2023-10-18 DIAGNOSIS — Z1509 Genetic susceptibility to other malignant neoplasm: Secondary | ICD-10-CM | POA: Diagnosis present

## 2023-10-18 DIAGNOSIS — Z1502 Genetic susceptibility to malignant neoplasm of ovary: Secondary | ICD-10-CM | POA: Insufficient documentation

## 2023-10-18 DIAGNOSIS — Z1501 Genetic susceptibility to malignant neoplasm of breast: Secondary | ICD-10-CM | POA: Insufficient documentation

## 2023-10-18 MED ORDER — GADOBUTROL 1 MMOL/ML IV SOLN
10.0000 mL | Freq: Once | INTRAVENOUS | Status: AC | PRN
  Administered 2023-10-18: 10 mL via INTRAVENOUS

## 2023-10-28 ENCOUNTER — Ambulatory Visit: Admitting: Family Medicine

## 2023-11-25 ENCOUNTER — Ambulatory Visit: Payer: Self-pay

## 2023-11-25 NOTE — Telephone Encounter (Signed)
 Noted, patient going to urgent care.

## 2023-11-25 NOTE — Telephone Encounter (Signed)
 FYI Only or Action Required?: FYI only for provider. Pt stated going to urgent care  Patient was last seen in primary care on 07/21/2023 by Antonetta Rollene BRAVO, MD.  Called Nurse Triage reporting Pain.  Symptoms began x 2 weeks and worsening.  Interventions attempted: OTC medications: tylenol  and arthritis cream.  Symptoms are: gradually worsening.  Triage Disposition: See Physician Within 24 Hours  Patient/caregiver understands and will follow disposition?: Yes   Copied from CRM [id number]. Topic: Clinical - Red Word Triage >> Nov 25, 2023  9:49 AM Charlet HERO wrote: Red Word that prompted transfer to Nurse Triage: Patient is calling bc neck is hurting she thinks she has a pulled muscle in it  for the past 2 weeks. Simpson Reason for Disposition  Fever present > 3 days (72 hours)  Answer Assessment - Initial Assessment Questions 1. ONSET: When did the muscle aches or body pains start?      X 2 weeks 2. LOCATION: What part of your body is hurting? (e.g., entire body, arms, legs)      Left side of neck, collarbone, shoulder, pass rotator  3. SEVERITY: How bad is the pain? (Scale 1-10; or mild, moderate, severe)     7/10 4. CAUSE: What do you think is causing the pains?     unknown 5. FEVER: Do you have a fever? If Yes, ask: What is your temperature, how was it measured, and  when did it start?      no 6. OTHER SYMPTOMS: Do you have any other symptoms? (e.g., chest pain, cold or flu symptoms, rash, weakness, weight loss)     Swollen in neck and collar, tightness, stiffness in neck - can hardly turn neck, headaches this weekend but suffers for migraines, numbness and tingling in bilateral fingers/hands but this is ongoing 7. PREGNANCY: Is there any chance you are pregnant? When was your last menstrual period?     na 8. TRAVEL: Have you traveled out of the country in the last month? (e.g., exposures, travel history)     Na  Tylenol  and arthritis without relief: PCP  not available: offered pt appt with provider in office tomorrow: pt wanted to come in today offered [facility]: pt stated no wanted to stay in  and will go to urgent care then pt hung up.  Protocols used: Muscle Aches and Body Pain-A-AH

## 2023-12-10 IMAGING — MG MM DIGITAL DIAGNOSTIC UNILAT*R* W/ TOMO W/ CAD
8 series · 8 of 24 positions shown · non-contrast
Comparison: Previous exam(s).

CLINICAL DATA: 60-year-old female with a physician palpated nodule
below the right nipple and the tender palpable area in the left
axilla. Patient has a history of bilateral triple negative breast
cancer, status post left mastectomy and right lumpectomy.

EXAM:
DIGITAL DIAGNOSTIC UNILATERAL RIGHT MAMMOGRAM WITH TOMOSYNTHESIS AND
CAD; ULTRASOUND LEFT BREAST LIMITED; ULTRASOUND RIGHT BREAST LIMITED
TECHNIQUE: Right digital diagnostic mammography and breast tomosynthesis was
performed. The images were evaluated with computer-aided detection.;
Targeted ultrasound examination of the left breast was performed.;
Targeted ultrasound examination of the right breast was performed

[R CV synth-2D]
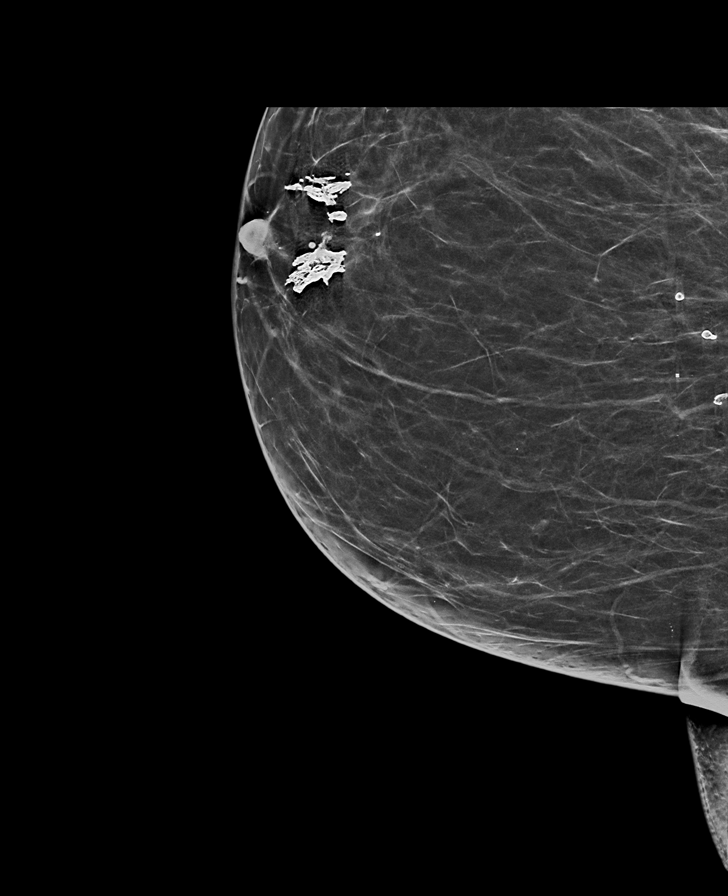

[R MLO synth-2D]
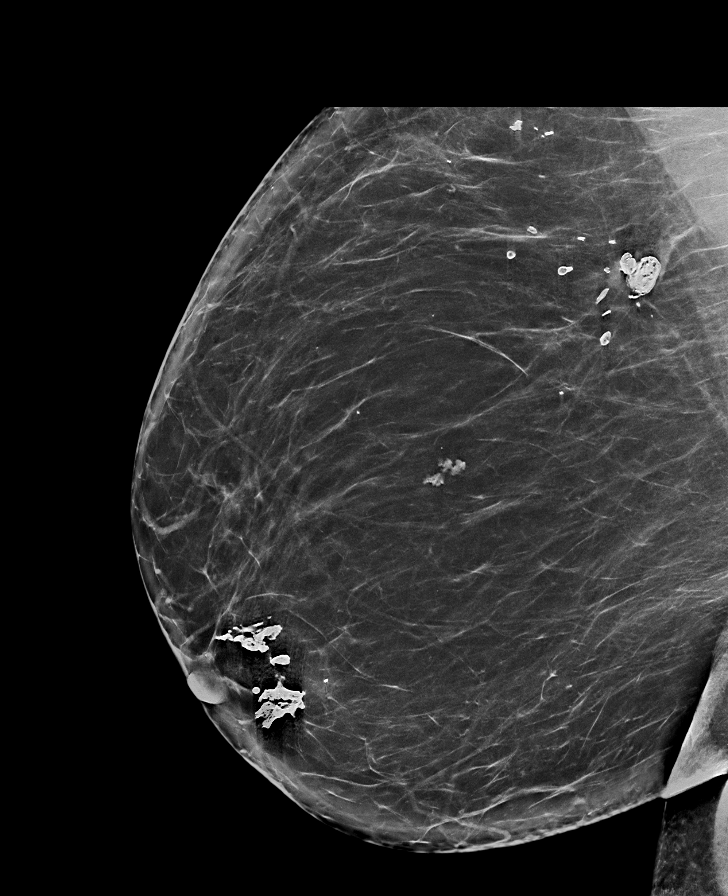

[R CC synth-2D]
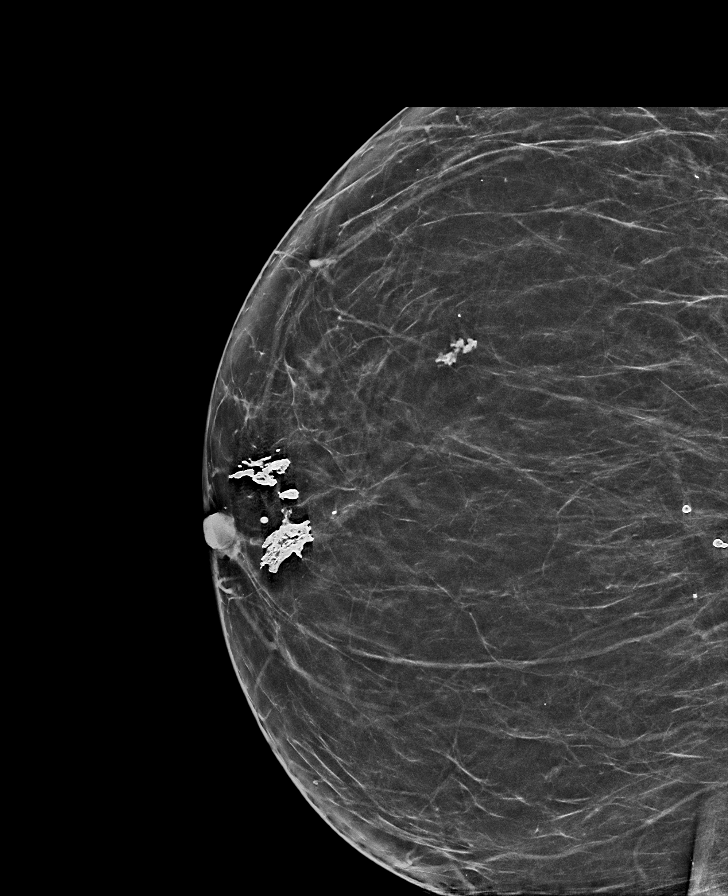

[R ML synth-2D]
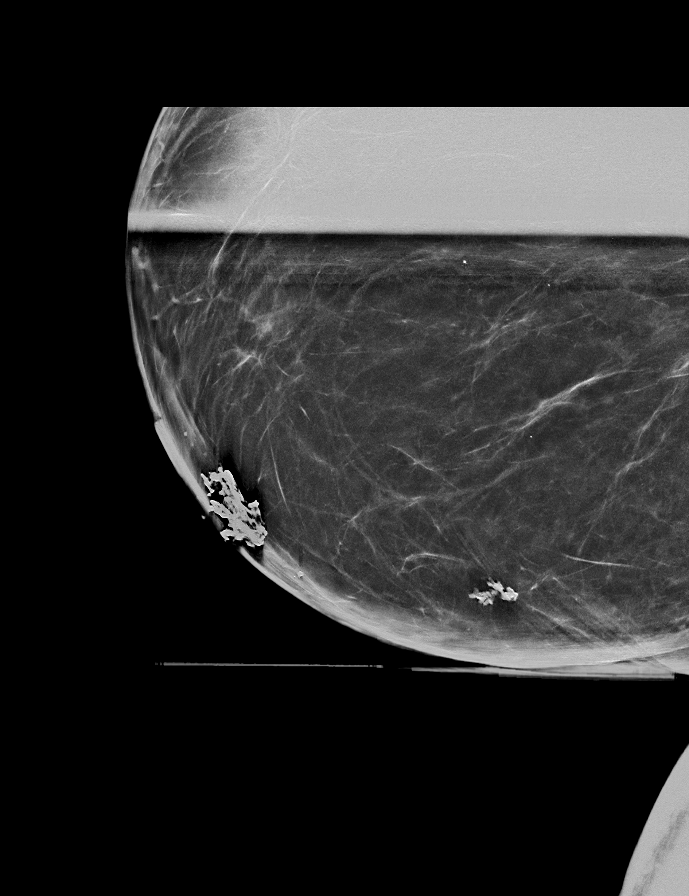

[R ML tomo · tomo slice 42/83.0]
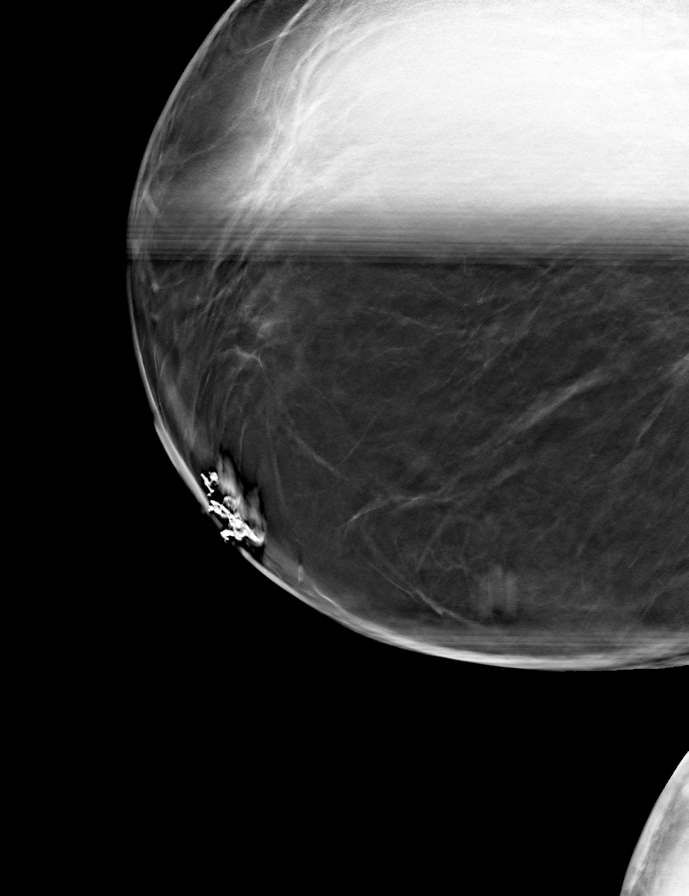

[R MLO tomo · tomo slice 47/92.0]
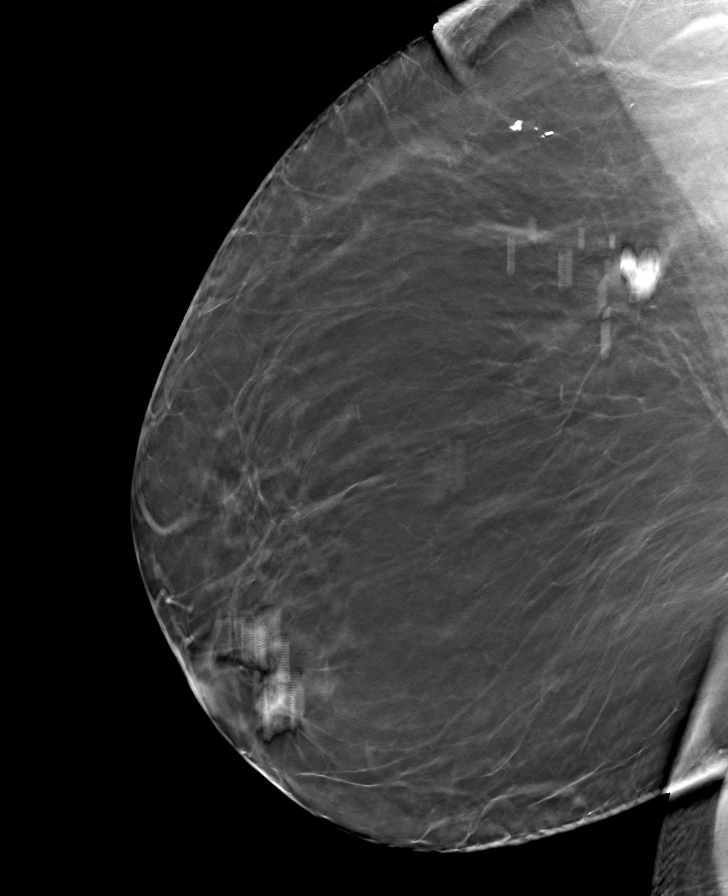

[R CC tomo · tomo slice 37/72.0]
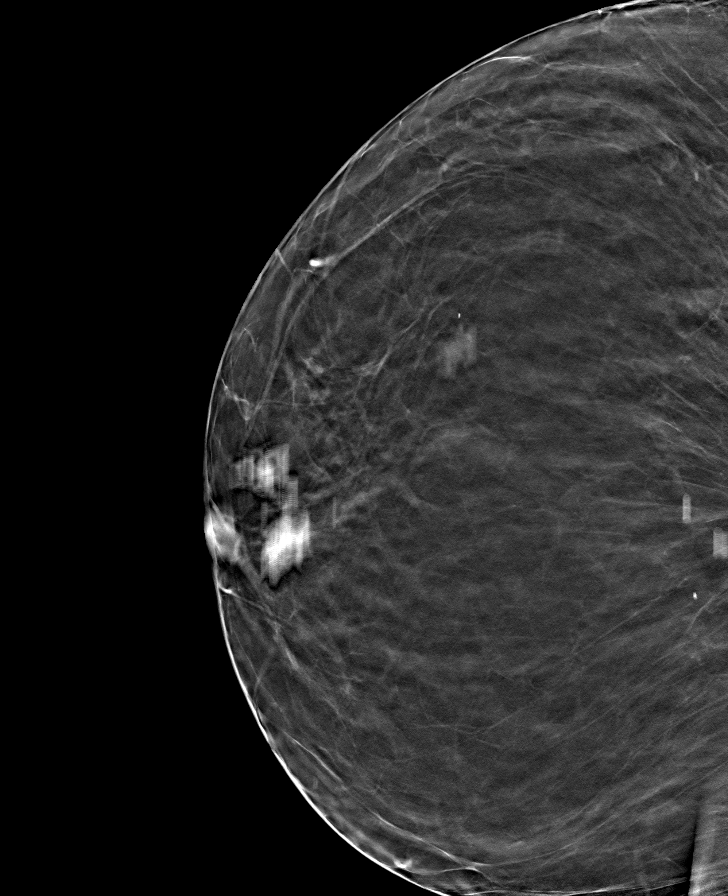

[R CV tomo · tomo slice 37/73.0]
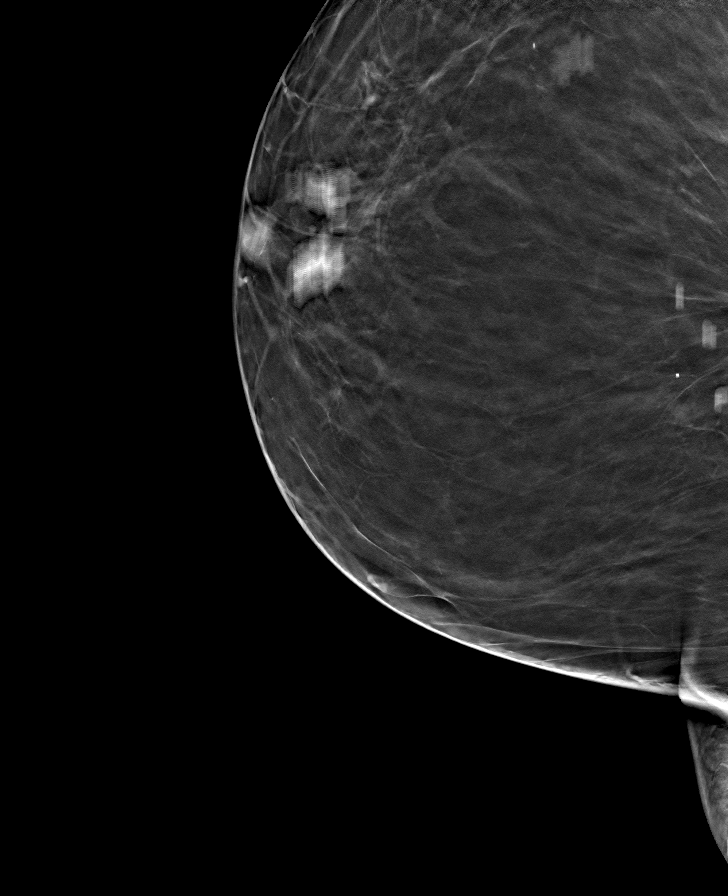

[8 of 24 positions shown; findings below may reference images not displayed]

ACR Breast Density Category b: There are scattered areas of
fibroglandular density.
FINDINGS: No focal or suspicious mammographic findings are identified within
right breast. Note is made of stable postsurgical changes including
calcified fat necrosis in the subareolar region.

Targeted ultrasound is performed, showing shadowing calcification of
the subareolar right breast corresponding with the patient's
palpable lump. This corresponds with the mammographically identified
fat necrosis. Evaluation of the bilateral axilla demonstrates no
suspicious lymphadenopathy.
IMPRESSION: 1. Benign right breast fat necrosis corresponding with the patient's
palpable lump. No further follow-up required.
2. Unremarkable ultrasound evaluation of the left axilla.

RECOMMENDATION:
1. Clinical follow-up recommended for the palpable area of concern
in the right breast and left axilla. Any further workup should be
based on clinical grounds.
2. Routine annual mammography of the right breast in 1 year.

I have discussed the findings and recommendations with the patient.
If applicable, a reminder letter will be sent to the patient
regarding the next appointment.

BI-RADS CATEGORY  2: Benign.

## 2023-12-17 ENCOUNTER — Ambulatory Visit

## 2023-12-17 VITALS — BP 153/70 | HR 64 | Ht 64.0 in | Wt 283.0 lb

## 2023-12-17 DIAGNOSIS — M542 Cervicalgia: Secondary | ICD-10-CM | POA: Diagnosis not present

## 2023-12-17 MED ORDER — TRIAMCINOLONE ACETONIDE 40 MG/ML IJ SUSP
40.0000 mg | Freq: Once | INTRAMUSCULAR | Status: AC
  Administered 2023-12-17: 40 mg via INTRAMUSCULAR

## 2023-12-17 MED ORDER — TIZANIDINE HCL 4 MG PO TABS: 4.0000 mg | ORAL_TABLET | Freq: Three times a day (TID) | ORAL | 0 refills | Status: AC | PRN

## 2023-12-17 NOTE — Progress Notes (Signed)
 Established Patient Office Visit  Subjective   Patient ID: Kathleen Reynolds, female    DOB: 01/30/1961  Age: 63 y.o. MRN: [id number]  Chief Complaint  Patient presents with   Medical Management of Chronic Issues    Pulled neck on right side when moving pain is a 10     Neck Pain  This is a new problem. The current episode started in the past 7 days. The problem occurs constantly. The problem has been unchanged. The pain is associated with nothing. The pain is present in the occipital region. The quality of the pain is described as aching. The pain is at a severity of 8/10. The pain is moderate. The symptoms are aggravated by position and twisting. The pain is Same all the time. Pertinent negatives include no chest pain, fever, headaches, pain with swallowing or trouble swallowing. She has tried acetaminophen  and heat for the symptoms. The treatment provided no relief.     Patient Active Problem List   Diagnosis Date Noted   Grief at loss of child 07/21/2023   Adjustment reaction with anxiety and depression 07/21/2023   Encounter for immunization 05/14/2023   Knee pain, bilateral 04/20/2023   Sore throat 11/18/2022   Depression, major, single episode, severe (HCC) 04/16/2022   GAD (generalized anxiety disorder) 04/16/2022   Snoring 11/25/2021   Annual visit for general adult medical examination with abnormal findings 11/25/2021   Right foot pain 11/13/2020   Essential hypertension 11/13/2020   Osteoarthritis of right knee 01/15/2020   Vitamin D  deficiency 11/26/2019   Hx of adenomatous colonic polyps 06/14/2019   Hemorrhoid 06/14/2019   History of colonic polyps 09/05/2018   Family history of colon cancer 09/05/2018   Breast cancer, left breast (HCC) 08/16/2015   Sciatica of right side associated with disorder of lumbar spine 08/27/2014   Allergic rhinitis 07/03/2014   Vertigo, intermittent 05/20/2014   Nonspecific elevation of levels of transaminase or lactic acid  dehydrogenase (LDH) 05/09/2014   Rotator cuff syndrome 09/19/2013   Metabolic syndrome X 07/02/2012   Anemia 02/18/2012   Obesity, morbid, BMI 40.0-49.9 (HCC) 02/18/2012   OA (osteoarthritis) of knee 12/29/2011   Chronic pain of right knee 12/14/2011   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  HENT:  Negative for trouble swallowing.   Cardiovascular:  Negative for chest pain.  Musculoskeletal:  Positive for neck pain.  Neurological:  Negative for headaches.      Objective:     BP (!) 153/70   Pulse 64   Ht 5' 4 (1.626 m)   Wt 283 lb (128.4 kg)   LMP 03/27/2011   SpO2 94%   BMI 48.58 kg/m  BP Readings from Last 3 Encounters:  12/17/23 (!) 153/70  07/21/23 (!) 142/84  04/28/23 118/72   Wt Readings from Last 3 Encounters:  12/17/23 283 lb (128.4 kg)  07/21/23 282 lb 1.3 oz (128 kg)  04/28/23 283 lb (128.4 kg)      Physical Exam Vitals and nursing note reviewed.  Constitutional:      Appearance: Normal appearance.  HENT:     Head: Normocephalic.  Eyes:     Extraocular Movements: Extraocular movements intact.     Pupils: Pupils are equal, round, and reactive to light.  Cardiovascular:  Rate and Rhythm: Normal rate and regular rhythm.  Pulmonary:     Effort: Pulmonary effort is normal.     Breath sounds: Normal breath sounds.  Musculoskeletal:     Cervical back: Neck supple. No rigidity. Pain with movement and muscular tenderness present. Decreased range of motion.  Lymphadenopathy:     Cervical: No cervical adenopathy.  Neurological:     Mental Status: She is alert and oriented to person, place, and time.  Psychiatric:        Mood and Affect: Mood normal.        Thought Content: Thought content normal.     No results found  for any visits on 12/17/23.    The 10-year ASCVD risk score (Arnett DK, et al., 2019) is: 31%    Assessment & Plan:   Problem List Items Addressed This Visit   None Visit Diagnoses       Neck pain, acute    -  Primary   Kenalog injection administered in the office and muscle relaxer added.  Recommend warm moist heat for symptom relief.  Recommend x-ray if no improvement   Relevant Medications   triamcinolone acetonide (KENALOG-40) injection 40 mg (Completed)   tiZANidine (ZANAFLEX) 4 MG tablet       No follow-ups on file.    Leita Longs, FNP

## 2024-02-01 ENCOUNTER — Other Ambulatory Visit: Payer: Self-pay | Admitting: Family Medicine

## 2024-02-23 ENCOUNTER — Encounter (INDEPENDENT_AMBULATORY_CARE_PROVIDER_SITE_OTHER): Payer: Self-pay | Admitting: *Deleted

## 2024-02-23 ENCOUNTER — Ambulatory Visit: Admitting: Family Medicine

## 2024-02-23 ENCOUNTER — Encounter: Payer: Self-pay | Admitting: Family Medicine

## 2024-02-23 VITALS — BP 135/80 | HR 63 | Resp 16 | Ht 64.0 in | Wt 294.1 lb

## 2024-02-23 DIAGNOSIS — Z1211 Encounter for screening for malignant neoplasm of colon: Secondary | ICD-10-CM | POA: Diagnosis not present

## 2024-02-23 DIAGNOSIS — I10 Essential (primary) hypertension: Secondary | ICD-10-CM

## 2024-02-23 DIAGNOSIS — E785 Hyperlipidemia, unspecified: Secondary | ICD-10-CM

## 2024-02-23 DIAGNOSIS — Z860100 Personal history of colon polyps, unspecified: Secondary | ICD-10-CM | POA: Diagnosis not present

## 2024-02-23 DIAGNOSIS — Z23 Encounter for immunization: Secondary | ICD-10-CM | POA: Diagnosis not present

## 2024-02-23 DIAGNOSIS — R7303 Prediabetes: Secondary | ICD-10-CM

## 2024-02-23 DIAGNOSIS — E559 Vitamin D deficiency, unspecified: Secondary | ICD-10-CM

## 2024-02-23 MED ORDER — BUTALBITAL-APAP-CAFFEINE 50-325-40 MG PO TABS: 1.0000 | ORAL_TABLET | Freq: Four times a day (QID) | ORAL | 1 refills | Status: AC | PRN

## 2024-02-23 NOTE — Patient Instructions (Addendum)
 F/U in 5 months  Pneumonia and influenza vaccines today  Pls reconsider covid vaccines and get those at your pharmacy I next 2 weeks if you decide to get them  Good idea to re set the compass with the focus on your health and well being , as able  Need colonoscopy, past due  Nurse pls print and give letter, I will re order  Nurse pls send for pap done in Fall 2025, dr Jama in [place], [facility] today pls add to those ordered in June cBC, [place] and vit D    Thanks for choosing [facility], we consider it a privelige to serve you.

## 2024-02-24 LAB — CBC WITH DIFFERENTIAL/PLATELET
Basophils Absolute: 0 x10E3/uL (ref 0.0–0.2)
Basos: 1 %
EOS (ABSOLUTE): 0 x10E3/uL (ref 0.0–0.4)
Eos: 1 %
Hematocrit: 42.6 % (ref 34.0–46.6)
Hemoglobin: 14 g/dL (ref 11.1–15.9)
Immature Grans (Abs): 0 x10E3/uL (ref 0.0–0.1)
Immature Granulocytes: 0 %
Lymphocytes Absolute: 1.7 x10E3/uL (ref 0.7–3.1)
Lymphs: 38 %
MCH: 30.7 pg (ref 26.6–33.0)
MCHC: 32.9 g/dL (ref 31.5–35.7)
MCV: 93 fL (ref 79–97)
Monocytes Absolute: 0.3 x10E3/uL (ref 0.1–0.9)
Monocytes: 7 %
Neutrophils Absolute: 2.4 x10E3/uL (ref 1.4–7.0)
Neutrophils: 53 %
Platelets: 187 x10E3/uL (ref 150–450)
RBC: 4.56 x10E6/uL (ref 3.77–5.28)
RDW: 14.1 % (ref 11.7–15.4)
WBC: 4.4 x10E3/uL (ref 3.4–10.8)

## 2024-02-24 LAB — BMP8+EGFR
BUN/Creatinine Ratio: 21 (ref 12–28)
BUN: 15 mg/dL (ref 8–27)
CO2: 20 mmol/L (ref 20–29)
Calcium: 9.7 mg/dL (ref 8.7–10.3)
Chloride: 105 mmol/L (ref 96–106)
Creatinine, Ser: 0.72 mg/dL (ref 0.57–1.00)
Glucose: 90 mg/dL (ref 70–99)
Potassium: 4.2 mmol/L (ref 3.5–5.2)
Sodium: 140 mmol/L (ref 134–144)
eGFR: 94 mL/min/1.73

## 2024-02-24 LAB — HEMOGLOBIN A1C
Est. average glucose Bld gHb Est-mCnc: 128 mg/dL
Hgb A1c MFr Bld: 6.1 % — ABNORMAL HIGH (ref 4.8–5.6)

## 2024-02-24 LAB — LIPID PANEL
Chol/HDL Ratio: 4 ratio (ref 0.0–4.4)
Cholesterol, Total: 248 mg/dL — ABNORMAL HIGH (ref 100–199)
HDL: 62 mg/dL
LDL Chol Calc (NIH): 169 mg/dL — ABNORMAL HIGH (ref 0–99)
Triglycerides: 99 mg/dL (ref 0–149)
VLDL Cholesterol Cal: 17 mg/dL (ref 5–40)

## 2024-02-24 LAB — VITAMIN D 25 HYDROXY (VIT D DEFICIENCY, FRACTURES): Vit D, 25-Hydroxy: 19.5 ng/mL — ABNORMAL LOW (ref 30.0–100.0)

## 2024-02-24 LAB — TSH: TSH: 2.17 u[IU]/mL (ref 0.450–4.500)

## 2024-02-27 ENCOUNTER — Encounter: Payer: Self-pay | Admitting: Family Medicine

## 2024-02-27 ENCOUNTER — Ambulatory Visit: Payer: Self-pay | Admitting: Family Medicine

## 2024-02-27 DIAGNOSIS — Z23 Encounter for immunization: Secondary | ICD-10-CM | POA: Insufficient documentation

## 2024-02-27 MED ORDER — ROSUVASTATIN CALCIUM 20 MG PO TABS: 20.0000 mg | ORAL_TABLET | Freq: Every day | ORAL | 1 refills | Status: AC

## 2024-02-27 MED ORDER — VITAMIN D (ERGOCALCIFEROL) 1.25 MG (50000 UNIT) PO CAPS: 50000.0000 [IU] | ORAL_CAPSULE | ORAL | 2 refills | Status: AC

## 2024-02-27 NOTE — Assessment & Plan Note (Signed)
 After obtaining informed consent, the flu and pneumonia  vaccines are   administered , with no adverse effect noted at the time of administration.

## 2024-02-27 NOTE — Assessment & Plan Note (Addendum)
 Patient educated about the importance of limiting  Carbohydrate intake , the need to commit to daily physical activity for a minimum of 30 minutes , and to commit weight loss. The fact that changes in all these areas will reduce or eliminate all together the development of diabetes is stressed.      Latest Ref Rng & Units 02/23/2024   10:37 AM 04/20/2023   11:15 AM 04/08/2023   12:13 PM 11/18/2022    3:59 PM 04/10/2022    9:35 AM  Diabetic Labs  HbA1c 4.8 - 5.6 % 6.1  6.3   6.1  6.0   Chol 100 - 199 mg/dL 751  767   779  783   HDL >39 mg/dL 62  57   53  55   Calc LDL 0 - 99 mg/dL 830  847   858  860   Triglycerides 0 - 149 mg/dL 99  871   853  878   Creatinine 0.57 - 1.00 mg/dL 9.27   9.32  9.26  9.27       02/23/2024    9:43 AM 12/17/2023    3:23 PM 07/21/2023    9:51 AM 07/21/2023    9:03 AM 04/28/2023    8:25 AM 04/20/2023   10:06 AM 04/12/2023    1:59 PM  BP/Weight  Systolic BP 135 153 142 150 118 129 126  Diastolic BP 80 70 84 88 72 75 64  Wt. (Lbs) 294.12 283  282.08 283 282.12 284.2  BMI 50.49 kg/m2 48.58 kg/m2  48.42 kg/m2 48.58 kg/m2 48.43 kg/m2 48.78 kg/m2      Latest Ref Rng & Units 07/02/2020   12:00 AM  Foot/eye exam completion dates  Eye Exam No Retinopathy No Retinopathy         This result is from an external source.    Improving , which is good

## 2024-02-27 NOTE — Assessment & Plan Note (Signed)
 Importance of follow through for colonoscopy discussed and she is committing to doing o

## 2024-02-27 NOTE — Assessment & Plan Note (Signed)
 Hyperlipidemia:Low fat diet discussed and encouraged.   Lipid Panel  Lab Results  Component Value Date   CHOL 248 (H) 02/23/2024   HDL 62 02/23/2024   LDLCALC 169 (H) 02/23/2024   TRIG 99 02/23/2024   CHOLHDL 4.0 02/23/2024     Deteriorated uncontrolled , needs to take statin and lower fat intake

## 2024-02-27 NOTE — Assessment & Plan Note (Signed)
 Updated lab needed at/ before next visit.

## 2024-02-27 NOTE — Assessment & Plan Note (Signed)
" °  Patient re-educated about  the importance of commitment to a  minimum of 150 minutes of exercise per week as able.  The importance of healthy food choices with portion control discussed, as well as eating regularly and within a 12 hour window most days. The need to choose clean , green food 50 to 75% of the time is discussed, as well as to make water the primary drink and set a goal of 64 ounces water daily.       02/23/2024    9:43 AM 12/17/2023    3:23 PM 07/21/2023    9:03 AM  Weight /BMI  Weight 294 lb 1.9 oz 283 lb 282 lb 1.3 oz  Height 5' 4 (1.626 m) 5' 4 (1.626 m) 5' 4 (1.626 m)  BMI 50.49 kg/m2 48.58 kg/m2 48.42 kg/m2    Deteriorated, will change food choice, has been eating excess  comfort food while grieving loss of son less than 6 months ago "

## 2024-02-27 NOTE — Progress Notes (Signed)
 "  Kathleen Reynolds     MRN: [id number]      DOB: 11/12/1960  Chief Complaint  Patient presents with   Hypertension    3 month follow up     HPI Kathleen Reynolds is here for follow up and re-evaluation of chronic medical conditions, medication management and review of any available recent lab and radiology data.  Preventive health is updated, specifically  Cancer screening and Immunization.   Colonoscopy past due and needs this, had failed to follow through The PT denies any adverse reactions to current medications since the last visit.  There are no new concerns.  There are no specific complaints   ROS Denies recent fever or chills. Denies sinus pressure, nasal congestion, ear pain or sore throat. Denies chest congestion, productive cough or wheezing. Denies chest pains, palpitations and leg swelling Denies abdominal pain, nausea, vomiting,diarrhea or constipation.   Denies dysuria, frequency, hesitancy or incontinence. Chronic  joint pain, swelling and limitation in mobility. Denies headaches, seizures, numbness, or tingling. Denies uncontrolled  depression, anxiety or insomnia. Denies skin break down or rash.   PE  BP 135/80   Pulse 63   Resp 16   Ht 5' 4 (1.626 m)   Wt 294 lb 1.9 oz (133.4 kg)   LMP 03/27/2011   SpO2 96%   BMI 50.49 kg/m   Patient alert and oriented and in no cardiopulmonary distress.  HEENT: No facial asymmetry, EOMI,     Neck supple .  Chest: Clear to auscultation bilaterally.  CVS: S1, S2 no murmurs, no S3.Regular rate.  ABD: Soft non tender.   Ext: No edema  MS: Adequate though reduced  ROM spine, shoulders, hips and knees.  Skin: Intact, no ulcerations or rash noted.  Psych: Good eye contact, normal affect. Memory intact not anxious or depressed appearing.  CNS: CN 2-12 intact, power,  normal throughout.no focal deficits noted.   Assessment & Plan  Essential hypertension Controlled, no change in medication DASH diet and  commitment to daily physical activity for a minimum of 30 minutes discussed and encouraged, as a part of hypertension management. The importance of attaining a healthy weight is also discussed.     02/23/2024    9:43 AM 12/17/2023    3:23 PM 07/21/2023    9:51 AM 07/21/2023    9:03 AM 04/28/2023    8:25 AM 04/20/2023   10:06 AM 04/12/2023    1:59 PM  BP/Weight  Systolic BP 135 153 142 150 118 129 126  Diastolic BP 80 70 84 88 72 75 64  Wt. (Lbs) 294.12 283  282.08 283 282.12 284.2  BMI 50.49 kg/m2 48.58 kg/m2  48.42 kg/m2 48.58 kg/m2 48.43 kg/m2 48.78 kg/m2       History of colonic polyps Importance of follow through for colonoscopy discussed and she is committing to doing o  Hyperlipidemia LDL goal <100 Hyperlipidemia:Low fat diet discussed and encouraged.   Lipid Panel  Lab Results  Component Value Date   CHOL 248 (H) 02/23/2024   HDL 62 02/23/2024   LDLCALC 169 (H) 02/23/2024   TRIG 99 02/23/2024   CHOLHDL 4.0 02/23/2024     Deteriorated uncontrolled , needs to take statin and lower fat intake  Immunization due After obtaining informed consent, the  flu and pneumonia  vaccines are   administered , with no adverse effect noted at the time of administration.   Obesity, morbid, BMI 40.0-49.9 (HCC)  Patient re-educated about  the importance of commitment to a  minimum of 150 minutes of exercise per week as able.  The importance of healthy food choices with portion control discussed, as well as eating regularly and within a 12 hour window most days. The need to choose clean , green food 50 to 75% of the time is discussed, as well as to make water the primary drink and set a goal of 64 ounces water daily.       02/23/2024    9:43 AM 12/17/2023    3:23 PM 07/21/2023    9:03 AM  Weight /BMI  Weight 294 lb 1.9 oz 283 lb 282 lb 1.3 oz  Height 5' 4 (1.626 m) 5' 4 (1.626 m) 5' 4 (1.626 m)  BMI 50.49 kg/m2 48.58 kg/m2 48.42 kg/m2    Deteriorated, will change food  choice, has been eating excess  comfort food while grieving loss of son less than 6 months ago  Prediabetes Patient educated about the importance of limiting  Carbohydrate intake , the need to commit to daily physical activity for a minimum of 30 minutes , and to commit weight loss. The fact that changes in all these areas will reduce or eliminate all together the development of diabetes is stressed.      Latest Ref Rng & Units 02/23/2024   10:37 AM 04/20/2023   11:15 AM 04/08/2023   12:13 PM 11/18/2022    3:59 PM 04/10/2022    9:35 AM  Diabetic Labs  HbA1c 4.8 - 5.6 % 6.1  6.3   6.1  6.0   Chol 100 - 199 mg/dL 751  767   779  783   HDL >39 mg/dL 62  57   53  55   Calc LDL 0 - 99 mg/dL 830  847   858  860   Triglycerides 0 - 149 mg/dL 99  871   853  878   Creatinine 0.57 - 1.00 mg/dL 9.27   9.32  9.26  9.27       02/23/2024    9:43 AM 12/17/2023    3:23 PM 07/21/2023    9:51 AM 07/21/2023    9:03 AM 04/28/2023    8:25 AM 04/20/2023   10:06 AM 04/12/2023    1:59 PM  BP/Weight  Systolic BP 135 153 142 150 118 129 126  Diastolic BP 80 70 84 88 72 75 64  Wt. (Lbs) 294.12 283  282.08 283 282.12 284.2  BMI 50.49 kg/m2 48.58 kg/m2  48.42 kg/m2 48.58 kg/m2 48.43 kg/m2 48.78 kg/m2      Latest Ref Rng & Units 07/02/2020   12:00 AM  Foot/eye exam completion dates  Eye Exam No Retinopathy No Retinopathy         This result is from an external source.    Improving , which is good  Vitamin D  deficiency Updated lab needed at/ before next visit.  "

## 2024-02-27 NOTE — Assessment & Plan Note (Signed)
 Controlled, no change in medication DASH diet and commitment to daily physical activity for a minimum of 30 minutes discussed and encouraged, as a part of hypertension management. The importance of attaining a healthy weight is also discussed.     02/23/2024    9:43 AM 12/17/2023    3:23 PM 07/21/2023    9:51 AM 07/21/2023    9:03 AM 04/28/2023    8:25 AM 04/20/2023   10:06 AM 04/12/2023    1:59 PM  BP/Weight  Systolic BP 135 153 142 150 118 129 126  Diastolic BP 80 70 84 88 72 75 64  Wt. (Lbs) 294.12 283  282.08 283 282.12 284.2  BMI 50.49 kg/m2 48.58 kg/m2  48.42 kg/m2 48.58 kg/m2 48.43 kg/m2 48.78 kg/m2

## 2024-02-29 ENCOUNTER — Other Ambulatory Visit: Payer: Self-pay

## 2024-02-29 DIAGNOSIS — E785 Hyperlipidemia, unspecified: Secondary | ICD-10-CM

## 2024-02-29 DIAGNOSIS — I10 Essential (primary) hypertension: Secondary | ICD-10-CM

## 2024-04-05 ENCOUNTER — Ambulatory Visit (HOSPITAL_COMMUNITY)

## 2024-04-05 ENCOUNTER — Inpatient Hospital Stay

## 2024-04-12 ENCOUNTER — Inpatient Hospital Stay: Admitting: Physician Assistant

## 2024-07-25 ENCOUNTER — Ambulatory Visit: Payer: Self-pay | Admitting: Family Medicine
